# Patient Record
Sex: Female | Born: 1950 | Race: White | Hispanic: No | Marital: Married | Smoking: Never smoker
Health system: Southern US, Community
[De-identification: ages and names within clinical notes are randomized; demographics above are authoritative.]

## PROBLEM LIST (undated history)

## (undated) DIAGNOSIS — I10 Essential (primary) hypertension: Secondary | ICD-10-CM

## (undated) DIAGNOSIS — K529 Noninfective gastroenteritis and colitis, unspecified: Secondary | ICD-10-CM

## (undated) DIAGNOSIS — S4380XA Sprain of other specified parts of unspecified shoulder girdle, initial encounter: Secondary | ICD-10-CM

## (undated) DIAGNOSIS — R519 Headache, unspecified: Secondary | ICD-10-CM

## (undated) DIAGNOSIS — M75100 Unspecified rotator cuff tear or rupture of unspecified shoulder, not specified as traumatic: Secondary | ICD-10-CM

## (undated) DIAGNOSIS — K829 Disease of gallbladder, unspecified: Secondary | ICD-10-CM

## (undated) DIAGNOSIS — D649 Anemia, unspecified: Secondary | ICD-10-CM

## (undated) DIAGNOSIS — Z9289 Personal history of other medical treatment: Secondary | ICD-10-CM

## (undated) DIAGNOSIS — E785 Hyperlipidemia, unspecified: Secondary | ICD-10-CM

## (undated) DIAGNOSIS — R7303 Prediabetes: Secondary | ICD-10-CM

## (undated) DIAGNOSIS — L570 Actinic keratosis: Secondary | ICD-10-CM

## (undated) DIAGNOSIS — M1990 Unspecified osteoarthritis, unspecified site: Secondary | ICD-10-CM

## (undated) DIAGNOSIS — Z780 Asymptomatic menopausal state: Secondary | ICD-10-CM

## (undated) DIAGNOSIS — R600 Localized edema: Secondary | ICD-10-CM

## (undated) DIAGNOSIS — K5900 Constipation, unspecified: Secondary | ICD-10-CM

## (undated) DIAGNOSIS — E559 Vitamin D deficiency, unspecified: Secondary | ICD-10-CM

## (undated) DIAGNOSIS — M1711 Unilateral primary osteoarthritis, right knee: Secondary | ICD-10-CM

## (undated) DIAGNOSIS — K922 Gastrointestinal hemorrhage, unspecified: Secondary | ICD-10-CM

## (undated) DIAGNOSIS — R208 Other disturbances of skin sensation: Secondary | ICD-10-CM

## (undated) HISTORY — DX: Asymptomatic menopausal state: Z78.0

## (undated) HISTORY — DX: Hyperlipidemia, unspecified: E78.5

## (undated) HISTORY — DX: Anemia, unspecified: D64.9

## (undated) HISTORY — DX: Other disturbances of skin sensation: R20.8

## (undated) HISTORY — DX: Unilateral primary osteoarthritis, right knee: M17.11

## (undated) HISTORY — PX: SPINE SURGERY: SHX786

## (undated) HISTORY — DX: Essential (primary) hypertension: I10

## (undated) HISTORY — DX: Personal history of other medical treatment: Z92.89

## (undated) HISTORY — PX: FRACTURE SURGERY: SHX138

## (undated) HISTORY — DX: Vitamin D deficiency, unspecified: E55.9

## (undated) HISTORY — DX: Constipation, unspecified: K59.00

## (undated) HISTORY — PX: TUBAL LIGATION: SHX77

## (undated) HISTORY — DX: Gastrointestinal hemorrhage, unspecified: K92.2

## (undated) HISTORY — DX: Actinic keratosis: L57.0

## (undated) HISTORY — DX: Localized edema: R60.0

## (undated) HISTORY — DX: Prediabetes: R73.03

## (undated) HISTORY — PX: JOINT REPLACEMENT: SHX530

## (undated) HISTORY — DX: Disease of gallbladder, unspecified: K82.9

---

## 1973-03-02 HISTORY — PX: HERNIA REPAIR: SHX51

## 1985-03-02 HISTORY — PX: ABDOMINAL HYSTERECTOMY: SHX81

## 2000-03-02 HISTORY — PX: CERVICAL FUSION: SHX112

## 2000-03-30 ENCOUNTER — Encounter: Payer: Self-pay | Admitting: Internal Medicine

## 2000-03-30 ENCOUNTER — Encounter (INDEPENDENT_AMBULATORY_CARE_PROVIDER_SITE_OTHER): Payer: Self-pay | Admitting: Specialist

## 2000-03-30 ENCOUNTER — Inpatient Hospital Stay (HOSPITAL_COMMUNITY): Admission: EM | Admit: 2000-03-30 | Discharge: 2000-04-01 | Payer: Self-pay | Admitting: Internal Medicine

## 2000-04-01 ENCOUNTER — Encounter: Payer: Self-pay | Admitting: Internal Medicine

## 2000-08-01 ENCOUNTER — Emergency Department (HOSPITAL_COMMUNITY): Admission: EM | Admit: 2000-08-01 | Discharge: 2000-08-01 | Payer: Self-pay | Admitting: Emergency Medicine

## 2000-08-01 ENCOUNTER — Encounter: Payer: Self-pay | Admitting: Emergency Medicine

## 2000-08-02 ENCOUNTER — Encounter: Payer: Self-pay | Admitting: Emergency Medicine

## 2000-12-22 ENCOUNTER — Inpatient Hospital Stay (HOSPITAL_COMMUNITY): Admission: RE | Admit: 2000-12-22 | Discharge: 2000-12-25 | Payer: Self-pay | Admitting: Neurosurgery

## 2000-12-22 ENCOUNTER — Encounter: Payer: Self-pay | Admitting: Neurosurgery

## 2001-01-06 ENCOUNTER — Encounter: Admission: RE | Admit: 2001-01-06 | Discharge: 2001-01-06 | Payer: Self-pay | Admitting: Neurosurgery

## 2001-01-06 ENCOUNTER — Encounter: Payer: Self-pay | Admitting: Neurosurgery

## 2001-03-02 HISTORY — PX: APPENDECTOMY: SHX54

## 2001-07-15 ENCOUNTER — Ambulatory Visit (HOSPITAL_COMMUNITY): Admission: RE | Admit: 2001-07-15 | Discharge: 2001-07-15 | Payer: Self-pay | Admitting: Obstetrics and Gynecology

## 2001-07-15 ENCOUNTER — Encounter: Payer: Self-pay | Admitting: Obstetrics and Gynecology

## 2001-10-14 ENCOUNTER — Encounter: Payer: Self-pay | Admitting: Internal Medicine

## 2001-10-14 ENCOUNTER — Ambulatory Visit (HOSPITAL_COMMUNITY): Admission: RE | Admit: 2001-10-14 | Discharge: 2001-10-14 | Payer: Self-pay | Admitting: Internal Medicine

## 2003-10-25 ENCOUNTER — Ambulatory Visit (HOSPITAL_COMMUNITY): Admission: RE | Admit: 2003-10-25 | Discharge: 2003-10-25 | Payer: Self-pay | Admitting: Internal Medicine

## 2004-03-24 ENCOUNTER — Ambulatory Visit: Payer: Self-pay | Admitting: Internal Medicine

## 2004-06-01 ENCOUNTER — Ambulatory Visit: Payer: Self-pay | Admitting: Internal Medicine

## 2004-06-02 ENCOUNTER — Ambulatory Visit: Payer: Self-pay | Admitting: Gastroenterology

## 2004-06-02 ENCOUNTER — Inpatient Hospital Stay (HOSPITAL_COMMUNITY): Admission: EM | Admit: 2004-06-02 | Discharge: 2004-06-03 | Payer: Self-pay | Admitting: Emergency Medicine

## 2004-06-03 ENCOUNTER — Encounter (INDEPENDENT_AMBULATORY_CARE_PROVIDER_SITE_OTHER): Payer: Self-pay | Admitting: *Deleted

## 2004-06-03 HISTORY — PX: COLONOSCOPY: SHX174

## 2004-06-19 ENCOUNTER — Ambulatory Visit: Payer: Self-pay | Admitting: Internal Medicine

## 2004-07-03 ENCOUNTER — Ambulatory Visit: Payer: Self-pay | Admitting: Internal Medicine

## 2004-07-10 ENCOUNTER — Ambulatory Visit: Payer: Self-pay | Admitting: Internal Medicine

## 2005-08-21 ENCOUNTER — Ambulatory Visit: Payer: Self-pay | Admitting: Internal Medicine

## 2005-09-12 ENCOUNTER — Ambulatory Visit: Payer: Self-pay | Admitting: Family Medicine

## 2006-04-11 ENCOUNTER — Emergency Department (HOSPITAL_COMMUNITY): Admission: EM | Admit: 2006-04-11 | Discharge: 2006-04-11 | Payer: Self-pay | Admitting: Family Medicine

## 2006-04-20 ENCOUNTER — Emergency Department (HOSPITAL_COMMUNITY): Admission: EM | Admit: 2006-04-20 | Discharge: 2006-04-20 | Payer: Self-pay | Admitting: Family Medicine

## 2007-02-05 ENCOUNTER — Ambulatory Visit: Payer: Self-pay | Admitting: Family Medicine

## 2007-02-05 LAB — CONVERTED CEMR LAB: Rapid Strep: POSITIVE

## 2007-02-08 ENCOUNTER — Telehealth: Payer: Self-pay | Admitting: Internal Medicine

## 2007-03-30 ENCOUNTER — Ambulatory Visit: Payer: Self-pay | Admitting: Internal Medicine

## 2007-03-30 ENCOUNTER — Telehealth: Payer: Self-pay | Admitting: Internal Medicine

## 2007-06-23 ENCOUNTER — Ambulatory Visit: Payer: Self-pay | Admitting: Internal Medicine

## 2007-06-23 ENCOUNTER — Telehealth: Payer: Self-pay | Admitting: Internal Medicine

## 2007-06-29 ENCOUNTER — Telehealth: Payer: Self-pay | Admitting: Internal Medicine

## 2007-11-29 ENCOUNTER — Ambulatory Visit: Payer: Self-pay | Admitting: Internal Medicine

## 2007-11-29 ENCOUNTER — Telehealth: Payer: Self-pay | Admitting: Internal Medicine

## 2008-06-28 ENCOUNTER — Ambulatory Visit: Payer: Self-pay | Admitting: Internal Medicine

## 2008-06-28 ENCOUNTER — Telehealth: Payer: Self-pay | Admitting: Internal Medicine

## 2008-06-28 DIAGNOSIS — I10 Essential (primary) hypertension: Secondary | ICD-10-CM

## 2008-09-27 ENCOUNTER — Telehealth: Payer: Self-pay | Admitting: Internal Medicine

## 2008-09-27 ENCOUNTER — Ambulatory Visit: Payer: Self-pay | Admitting: Internal Medicine

## 2008-09-27 DIAGNOSIS — E785 Hyperlipidemia, unspecified: Secondary | ICD-10-CM

## 2008-09-27 LAB — CONVERTED CEMR LAB
Calcium: 10.2 mg/dL (ref 8.4–10.5)
GFR calc non Af Amer: 91.31 mL/min (ref >60)
Glucose, Bld: 106 mg/dL — ABNORMAL HIGH (ref 70–99)
HDL: 45.2 mg/dL (ref >39.00)

## 2008-10-03 ENCOUNTER — Telehealth: Payer: Self-pay | Admitting: Internal Medicine

## 2008-10-04 ENCOUNTER — Ambulatory Visit: Payer: Self-pay | Admitting: Internal Medicine

## 2008-10-04 ENCOUNTER — Telehealth: Payer: Self-pay | Admitting: Internal Medicine

## 2008-12-31 ENCOUNTER — Telehealth: Payer: Self-pay | Admitting: Internal Medicine

## 2009-01-04 ENCOUNTER — Ambulatory Visit: Payer: Self-pay | Admitting: Internal Medicine

## 2009-01-04 DIAGNOSIS — Z780 Asymptomatic menopausal state: Secondary | ICD-10-CM | POA: Insufficient documentation

## 2009-01-04 LAB — CONVERTED CEMR LAB
AST: 20 units/L (ref 0–37)
Alkaline Phosphatase: 71 units/L (ref 39–117)
Bilirubin Urine: NEGATIVE
Bilirubin, Direct: 0.1 mg/dL (ref 0.0–0.3)
Chloride: 105 meq/L (ref 96–112)
Creatinine, Ser: 0.7 mg/dL (ref 0.4–1.2)
Eosinophils Absolute: 0.2 10*3/uL (ref 0.0–0.7)
GFR calc non Af Amer: 91.22 mL/min (ref >60)
Glucose, Bld: 103 mg/dL — ABNORMAL HIGH (ref 70–99)
HCT: 39.8 % (ref 36.0–46.0)
HDL: 41.6 mg/dL (ref >39.00)
Lymphocytes Relative: 30 % (ref 12.0–46.0)
MCHC: 34.9 g/dL (ref 30.0–36.0)
MCV: 94.9 fL (ref 78.0–100.0)
Neutrophils Relative %: 60.6 % (ref 43.0–77.0)
Platelets: 226 10*3/uL (ref 150.0–400.0)
Potassium: 4.3 meq/L (ref 3.5–5.1)
RDW: 12.3 % (ref 11.5–14.6)
Sodium: 142 meq/L (ref 135–145)
Specific Gravity, Urine: <=1.005 (ref 1.000–1.030)
Total Bilirubin: 0.8 mg/dL (ref 0.3–1.2)
Total CHOL/HDL Ratio: 4
Total Protein, Urine: NEGATIVE mg/dL
Triglycerides: 92 mg/dL (ref 0.0–149.0)
Urobilinogen, UA: 0.2 (ref 0.0–1.0)
pH: 6 (ref 5.0–8.0)

## 2009-02-12 ENCOUNTER — Ambulatory Visit: Payer: Self-pay | Admitting: Internal Medicine

## 2009-02-12 DIAGNOSIS — J019 Acute sinusitis, unspecified: Secondary | ICD-10-CM

## 2009-02-13 ENCOUNTER — Telehealth: Payer: Self-pay | Admitting: Internal Medicine

## 2009-04-03 ENCOUNTER — Telehealth: Payer: Self-pay | Admitting: Internal Medicine

## 2009-05-06 ENCOUNTER — Ambulatory Visit: Payer: Self-pay | Admitting: Internal Medicine

## 2009-05-06 DIAGNOSIS — J029 Acute pharyngitis, unspecified: Secondary | ICD-10-CM

## 2009-05-13 ENCOUNTER — Telehealth: Payer: Self-pay | Admitting: Internal Medicine

## 2009-10-01 ENCOUNTER — Ambulatory Visit: Payer: Self-pay | Admitting: Internal Medicine

## 2009-10-01 ENCOUNTER — Encounter (INDEPENDENT_AMBULATORY_CARE_PROVIDER_SITE_OTHER): Payer: Self-pay | Admitting: *Deleted

## 2009-10-01 DIAGNOSIS — N390 Urinary tract infection, site not specified: Secondary | ICD-10-CM | POA: Insufficient documentation

## 2009-10-01 LAB — CONVERTED CEMR LAB
Glucose, Urine, Semiquant: NEGATIVE
Ketones, urine, test strip: NEGATIVE
Nitrite: NEGATIVE
Protein, U semiquant: NEGATIVE
Specific Gravity, Urine: <1.005

## 2010-04-01 NOTE — Assessment & Plan Note (Signed)
Summary: ??BLADDER INFECTION/CD   Vital Signs:  Patient profile:   60 year old female Weight:      199.50 pounds (90.68 kg) BMI:     31.36 O2 Sat:      96 % on Room air Temp:     98.3 degrees F (36.83 degrees C) oral Pulse rate:   64 / minute BP sitting:   138 / 70  (left arm) Cuff size:   regular  Vitals Entered By: Orlan Leavens RMA (October 01, 2009 2:47 PM)  Nutrition Counseling: Patient's BMI is greater than 25 and therefore counseled on weight management options.  O2 Flow:  Room air CC: UTI, burniing, itching Is Patient Diabetic? No Pain Assessment Patient in pain? no        Primary Care Provider:  Newt Lukes MD  CC:  UTI, burniing, and itching.  History of Present Illness: c/o dysuria - precipitated by sex 7 days ago - feels pressure and itching discomfort no flank pain - +hx same but not in >24mo ?bladder prolapse "bulge" ? -has appt with gyn to eval same upcoming   also review chronic med issues- HTN -reports compliance with ongoing medical treatment and no changes in medication dose or frequency. denies adverse side effects related to current therapy.   dyslipidemia - reports compliance with ongoing medical treatment and no changes in medication dose or frequency. denies adverse side effects related to current therapy.   Clinical Review Panels:  CBC   WBC:  7.0 (01/04/2009)   RBC:  4.19 (01/04/2009)   Hgb:  13.9 (01/04/2009)   Hct:  39.8 (01/04/2009)   Platelets:  226.0 (01/04/2009)   MCV  94.9 (01/04/2009)   MCHC  34.9 (01/04/2009)   RDW  12.3 (01/04/2009)   PMN:  60.6 (01/04/2009)   Lymphs:  30.0 (01/04/2009)   Monos:  6.3 (01/04/2009)   Eosinophils:  2.3 (01/04/2009)   Basophil:  0.8 (01/04/2009)  Complete Metabolic Panel   Glucose:  103 (01/04/2009)   Sodium:  142 (01/04/2009)   Potassium:  4.3 (01/04/2009)   Chloride:  105 (01/04/2009)   CO2:  31 (01/04/2009)   BUN:  14 (01/04/2009)   Creatinine:  0.7 (01/04/2009)   Albumin:   3.9 (01/04/2009)   Total Protein:  7.4 (01/04/2009)   Calcium:  9.9 (01/04/2009)   Total Bili:  0.8 (01/04/2009)   Alk Phos:  71 (01/04/2009)   SGPT (ALT):  21 (01/04/2009)   SGOT (AST):  20 (01/04/2009)   Current Medications (verified): 1)  Advil 200 Mg  Tabs (Ibuprofen) .... As Needed 2)  Aleve 220 Mg  Tabs (Naproxen Sodium) .... As Needed 3)  Benicar Hct 20-12.5 Mg Tabs (Olmesartan Medoxomil-Hctz) .... 2 By Mouth Once Daily 4)  Lipitor 10 Mg Tabs (Atorvastatin Calcium) .Marland Kitchen.. 1 By Mouth At Bedtime 5)  Flonase 50 Mcg/act Susp (Fluticasone Propionate) .Marland Kitchen.. 1 Spray Each Nostril Every Morning 6)  Xyzal 5 Mg Tabs (Levocetirizine Dihydrochloride) .Marland Kitchen.. 1 By Mouth Once Daily As Needed  Allergies (verified): 1)  ! Codeine  Past History:  Past Medical History: hypertension dyslipidemia  MD roster: gyn - [facility]  Past Surgical History: partial hysterectomy - 1987  Review of Systems  The patient denies fever, chest pain, abdominal pain, and hematuria.    Physical Exam  General:  alert, well-developed, well-nourished, and cooperative to examination.  nontoxic Lungs:  normal respiratory effort, no intercostal retractions or use of accessory muscles; normal breath sounds bilaterally - no crackles and no wheezes.  Heart:  normal rate, regular rhythm, no murmur, and no rub. BLE without edema. Abdomen:  soft, non-tender, normal bowel sounds, no distention; no masses and no appreciable hepatomegaly or splenomegaly.  no flank pain   Impression & Recommendations:  Problem # 1:  UTI (ICD-599.0)  tx 5 day cipro, send for Ucx to confirm - agree with gyn eval for ?prolapse symptoms  work note for tonight - done Her updated medication list for this problem includes:    Cipro 500 Mg Tabs (Ciprofloxacin hcl) .Marland Kitchen... 1 by mouth two times a day x 5 days  Orders: UA Dipstick w/o Micro (manual) (16109) Prescription Created Electronically [id number]) T-Culture, Urine ([id number])  Encouraged  to push clear liquids, get enough rest, and take acetaminophen as needed. To be seen in 10 days if no improvement, sooner if worse.  Complete Medication List: 1)  Advil 200 Mg Tabs (Ibuprofen) .... As needed 2)  Aleve 220 Mg Tabs (Naproxen sodium) .... As needed 3)  Benicar Hct 20-12.5 Mg Tabs (Olmesartan medoxomil-hctz) .... 2 by mouth once daily 4)  Lipitor 10 Mg Tabs (Atorvastatin calcium) .Marland Kitchen.. 1 by mouth at bedtime 5)  Flonase 50 Mcg/act Susp (Fluticasone propionate) .Marland Kitchen.. 1 spray each nostril every morning 6)  Xyzal 5 Mg Tabs (Levocetirizine dihydrochloride) .Marland Kitchen.. 1 by mouth once daily as needed 7)  Cipro 500 Mg Tabs (Ciprofloxacin hcl) .Marland Kitchen.. 1 by mouth two times a day x 5 days 8)  Fluconazole 150 Mg Tabs (Fluconazole) .Marland Kitchen.. 1 by mouth now, repeat as needed for itch  Patient Instructions: 1)  it was good to see you today. 2)  cipro and diflucan as discussed- ok to use Azo as needed - your prescriptions have been electronically submitted to your pharmacy. Please take as directed. Contact our office if you believe you're having problems with the medication(s).  3)  Get plenty of rest, drink lots of clear liquids, and use Tylenol or Ibuprofen for fever and comfort. Return in 7-10 days if you're not better:sooner if you're feeling worse. 4)  work excuse note provided 5)  followup with gynecology as discussed -  6)  Please keep follow-up appointment here as scheduled (or 6 months), call sooner if problems.  Prescriptions: FLUCONAZOLE 150 MG TABS (FLUCONAZOLE) 1 by mouth now, repeat as needed for itch  #2 x 1   Entered and Authorized by:   Newt Lukes MD   Signed by:   Newt Lukes MD on 10/01/2009   Method used:   [facility] [id number]       [address]       [place], [place]  [address]       Ph: [phone] or [phone]       Fax: [phone]   RxID:   [id number] CIPRO 500 MG TABS (CIPROFLOXACIN HCL) 1 by mouth two times a  day x 5 days  #10 x 0   Entered and Authorized by:   Newt Lukes MD   Signed by:   Newt Lukes MD on 10/01/2009   Method used:   [facility]        [facility] ([facility]       [address]       [place], [place]  [address]       Ph: [phone] or [phone]       Fax: [phone]   RxID:   [id number]   Laboratory Results  Urine Tests    Routine Urinalysis   Color: lt. yellow Appearance: Clear Glucose: negative   (Normal Range: Negative) Bilirubin: negative   (Normal Range: Negative) Ketone: negative   (Normal Range: Negative) Spec. Gravity: <1.005   (Normal Range: 1.003-1.035) Blood: large   (Normal Range: Negative) pH: 5.0   (Normal Range: 5.0-8.0) Protein: negative   (Normal Range: Negative) Urobilinogen: 0.2   (Normal Range: 0-1) Nitrite: negative   (Normal Range: Negative) Leukocyte Esterace: large   (Normal Range: Negative)

## 2010-04-01 NOTE — Letter (Signed)
Summary: Work [facility]  [address] [place], [place] [address]   Phone: [phone]  Fax: [phone]    Today's Date: October 01, 2009  Name of Patient: Bailey [name]  The above named patient had a medical visit today 10/01/09  Please take this into consideration when reviewing the time away from work  Special Instructions:  [  ] None  [  ] To be off the remainder of today, returning to the normal work tomorrow.  [  ] To be off until the next scheduled appointment on ______________________.  [  ] Other ________________________________________________________________ ________________________________________________________________________   Sincerely yours,   Dr. Rene Paci

## 2010-04-01 NOTE — Progress Notes (Signed)
Summary: RX refill  Phone Note Call from Patient Call back at Home Phone [phone]   Caller: Patient Summary of Call: pt called stating that she was senn by MD last week but still has head congestion. pt is requesting refills of Xyzal. Medication not med list...okay to fill? Initial call taken by: Margaret Pyle, CMA,  May 13, 2009 10:53 AM  Follow-up for Phone Call        yes - one daily as needed - thanks Follow-up by: Newt Lukes MD,  May 13, 2009 1:11 PM    New/Updated Medications: XYZAL 5 MG TABS (LEVOCETIRIZINE DIHYDROCHLORIDE) 1 by mouth once daily as needed Prescriptions: XYZAL 5 MG TABS (LEVOCETIRIZINE DIHYDROCHLORIDE) 1 by mouth once daily as needed  #30 x 11   Entered by:   Margaret Pyle, CMA   Authorized by:   Newt Lukes MD   Signed by:   Margaret Pyle, CMA on 05/13/2009   Method used:   [facility]        [facility] [facility]       [address]       [place], [place]  [address]       Ph: [phone] or [phone]       Fax: [phone]   RxID:   [id number]

## 2010-04-01 NOTE — Progress Notes (Signed)
Summary: benicar  Phone Note Refill Request Message from:  Fax from Pharmacy on April 03, 2009 11:31 AM  Refills Requested: Medication #1:  BENICAR HCT 20-12.5 MG TABS 2 by mouth once daily  Method Requested: Electronic Initial call taken by: Orlan Leavens,  April 03, 2009 11:31 AM    Prescriptions: BENICAR HCT 20-12.5 MG TABS (OLMESARTAN MEDOXOMIL-HCTZ) 2 by mouth once daily  #60 x 6   Entered by:   Orlan Leavens   Authorized by:   Newt Lukes MD   Signed by:   Orlan Leavens on 04/03/2009   Method used:   [facility]        [facility] ([facility]ail)       [address]       [place], [place]  [address]       Ph: [phone] or [phone]       Fax: [phone]   RxID:   [id number]

## 2010-04-01 NOTE — Assessment & Plan Note (Signed)
Summary: STREP? /NWS   Vital Signs:  Patient profile:   60 year old female Height:      67 inches (170.18 cm) Weight:      199.0 pounds (90.45 kg) BMI:     31.28 O2 Sat:      97 % on Room air Temp:     97.4 degrees F (36.33 degrees C) oral Pulse rate:   60 / minute BP sitting:   148 / 90  (left arm) Cuff size:   regular  Vitals Entered By: Orlan Leavens (May 06, 2009 4:25 PM)  O2 Flow:  Room air CC: ? strep throat, URI symptoms Is Patient Diabetic? No Pain Assessment Patient in pain? no        Primary Care Provider:  Newt Lukes MD  CC:  ? strep throat and URI symptoms.  History of Present Illness:  URI Symptoms      This is a 60 year old woman who presents with URI symptoms.  The symptoms began 3 days ago.  The severity is described as moderate.  g-son with second epidsode of strep requiring ER eval for IVF and prednisone - seening ENT today and now being checked for mono. Pt c/o swelling feeling in neck but no trouble breating or swallowing -.  The patient reports nasal congestion, sore throat, and sick contacts, but denies dry cough, productive cough, and earache.  The patient denies fever, dyspnea, wheezing, rash, vomiting, and use of an antipyretic.  The patient also reports sneezing, headache, and severe fatigue.  The patient denies seasonal symptoms and muscle aches.  Risk factors for Strep sinusitis include Strep exposure and absence of cough.  The patient denies the following risk factors for Strep sinusitis: tender adenopathy.    Current Medications (verified): 1)  Advil 200 Mg  Tabs (Ibuprofen) .... As Needed 2)  Aleve 220 Mg  Tabs (Naproxen Sodium) .... As Needed 3)  Benicar Hct 20-12.5 Mg Tabs (Olmesartan Medoxomil-Hctz) .... 2 By Mouth Once Daily 4)  Lipitor 10 Mg Tabs (Atorvastatin Calcium) .Marland Kitchen.. 1 By Mouth At Bedtime 5)  Flonase 50 Mcg/act Susp (Fluticasone Propionate) .Marland Kitchen.. 1 Spray Each Nostril Every Morning  Allergies (verified): 1)  ! Codeine  Past  History:  Past Medical History: hypertension dyslipidemia  Review of Systems  The patient denies anorexia, vision loss, decreased hearing, hoarseness, chest pain, dyspnea on exertion, and abdominal pain.    Physical Exam  General:  alert, well-developed, well-nourished, and cooperative to examination.   mildly ill Eyes:  vision grossly intact; pupils equal, round and reactive to light.  conjunctiva and lids normal.    Ears:  normal pinnae bilaterally, without erythema, swelling, or tenderness to palpation. TMs clear, without effusion, or cerumen impaction. Hearing grossly normal bilaterally  Mouth:  teeth and gums in good repair; mucous membranes moist, without lesions or ulcers. oropharynx clear without exudate, mod erythema. +PND Lungs:  normal respiratory effort, no intercostal retractions or use of accessory muscles; normal breath sounds bilaterally - no crackles and no wheezes.    Heart:  normal rate, regular rhythm, no murmur, and no rub. BLE without edema. Psych:  Oriented X3, memory intact for recent and remote, normally interactive, good eye contact, not anxious appearing, not depressed appearing, and not agitated.      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 As needed    Aleve 220 Mg Tabs (Naproxen sodium) .Marland Kitchen... As needed    Amoxicillin 500 Mg Caps (Amoxicillin) .Marland Kitchen... 1 by mouth three times a day x 7 days  Orders: Rapid Strep ([address]) Prescription Created Electronically [phone])  Instructed to complete antibiotics and call if not improved in 48 hours.   Complete Medication List: 1)  Advil 200 Mg Tabs (Ibuprofen) .... As needed 2)  Aleve 220 Mg Tabs (Naproxen sodium) .... As needed 3)  Benicar Hct 20-12.5 Mg Tabs (Olmesartan medoxomil-hctz) .... 2 by  mouth once daily 4)  Lipitor 10 Mg Tabs (Atorvastatin calcium) .Marland Kitchen.. 1 by mouth at bedtime 5)  Flonase 50 Mcg/act Susp (Fluticasone propionate) .Marland Kitchen.. 1 spray each nostril every morning 6)  Amoxicillin 500 Mg Caps (Amoxicillin) .Marland Kitchen.. 1 by mouth three times a day x 7 days  Patient Instructions: 1)  it was good to see you today. 2)  antibiotics - amoxicillin for your throat symptoms as discussed -your prescription has been electronically submitted to your pharmacy. Please take as directed. Contact our office if you believe you're having problems with the medication(s).  3)  Get plenty of rest, drink lots of clear liquids, and use Tylenol or Ibuprofen for fever and comfort. Return in 7-10 days if you're not better:sooner if you're feeling worse. Prescriptions: AMOXICILLIN 500 MG CAPS (AMOXICILLIN) 1 by mouth three times a day x 7 days  #21 x 0   Entered and Authorized by:   Newt Lukes MD   Signed by:   Newt Lukes MD on 05/06/2009   Method used:   [facility]        [facility] ([facility]ail)       [address]       [place], [place]  [address]       Ph: [phone] or [phone]       Fax: [phone]   RxID:   [id number]   Laboratory Results    Other Tests  Rapid Strep: negative

## 2010-05-30 ENCOUNTER — Other Ambulatory Visit: Payer: Self-pay | Admitting: Internal Medicine

## 2010-07-17 ENCOUNTER — Telehealth: Payer: Self-pay

## 2010-07-17 MED ORDER — SCOPOLAMINE 1 MG/3DAYS TD PT72: 1.0000 | MEDICATED_PATCH | TRANSDERMAL | Status: DC

## 2010-07-17 NOTE — Telephone Encounter (Signed)
Pharmacy called requesting prescription for motion sickness patches for pt. Pt will be going on a cruise in the near future

## 2010-07-17 NOTE — Telephone Encounter (Signed)
rx sent

## 2010-07-18 NOTE — Discharge Summary (Signed)
[facility]  Patient:    Bailey [name], Bailey [name]                      MRN: [id number] Adm. Date:  [id number] Disc. Date: [id number] Attending:  Tresa Garter CC:         Sonda Primes, M.D. [facility]  Rosalyn Gess. Norins, M.D. [facility]   Discharge Summary  REASON FOR ADMISSION:  Abdominal pain, nausea, vomiting.  HISTORY OF PRESENT ILLNESS:  The patient is a 60 year old white female who presented to the office at [facility] with nausea, vomiting, and abdominal pain for six to eight hours.  The patient was seen and evaluated and sent to [facility] for admission and hydration.  CT scan of the abdomen was performed which showed findings consistent with acute appendicitis.  General surgery was consulted.  HOSPITAL COURSE:  The patient was seen on the evening of March 30, 2000, after admission, and CT scan of the abdomen demonstrated findings consistent with acute appendicitis.  The patient was prepared and taken to the operating room.  She underwent laparoscopic appendectomy with findings of acute appendicitis.  Postoperative course was straightforward.  She was advanced on her diet from clear liquids to a regular diet.  She became ambulatory.  She received 48 hours of intravenous antibiotics.  She was prepared for discharge home on the second postoperative day.  DISCHARGE PLAN:  The patient is discharged home April 01, 2000, in good condition, tolerated a regular diet, and ambulating independently.  FOLLOW-UP:  She will be seen back in my office at [facility] in two weeks.  DISCHARGE MEDICATIONS:  Vicodin as needed for pain, and other medications as per usual.  FINAL DIAGNOSIS:  Acute suppurative appendicitis.  CONDITION ON DISCHARGE:  Improved. DD:  04/16/00 TD:  04/17/00 Job: [id number]

## 2010-07-18 NOTE — Discharge Summary (Signed)
Bailey [name], Bailey [name]               ACCOUNT NO.:  [id number]   MEDICAL RECORD NO.:  [id number]          PATIENT TYPE:  INP   LOCATION:  0460                         FACILITY:  [facility]   PHYSICIAN:  Rene Paci, M.D. LHCDATE OF BIRTH:  08-Dec-1950   DATE OF ADMISSION:  06/01/2004  DATE OF DISCHARGE:  06/03/2004                                 DISCHARGE SUMMARY   DISCHARGE DIAGNOSES:  1.  Acute lower gastrointestinal bleed secondary to moderate ischemic      colitis status post colonoscopy June 03, 2004, with ischemic changes at      splenic flexure to descending colon, bleeding resolved, hemodynamically      stable.  2.  History of hypertension, avoid diuretics for volume control per GI.  3.  History of anxiety and depression.  4.  History of migraines.  5.  Question restless legs with nightly leg cramps, further outpatient      workup per primary M.D.   DISCHARGE MEDICATIONS:  Include:  1.  Discontinuation of HCTZ.  2.  Prinivil 10 mg p.o. b.i.d.  3.  Phenergan 12.5 mg p.o. q.4 hours p.r.n.  4.  Darvocet 1-2 p.o. q.4 hours p.r.n.   DISPOSITION:  patient is discharged home in medically stable condition.   CONSULTS:  Include Dr. Claudette Head, of GI.   HOSPITAL COURSE BY PROBLEM:  Acute lower GI bleed.  The patient is a  pleasant, but anxious, 60 year old woman who came to the emergency room the  day of admission secondary to ongoing bright red blood per rectum and clots  associated with abdominal cramping diffusely.  In the emergency room, she  was hemodynamically stable with a hemoglobin of 15.4 but reluctant to go  home as she wished to pursue an inpatient colonoscopy.  The following  morning her hemoglobin remained stable at 13.4 with no further bleeding, but  a GI consult was called at patient's request.  Dr. Russella Dar saw patient and  agreed to do colonoscopy which was performed on June 03, 2004, showing mild  ischemic changes at the splenic flexure perhaps due to volume  status of  diuretic.  Thus, diuretics were discontinued and her Prinivil was increased  to twice daily from blood pressure control.  Further monitoring and followup  with primary care physician, avoid aspirin and other NSAID products.  No  other changes were noted on colonoscopy.  Other medications are as prior to  admission without change.      VL/MEDQ  D:  06/03/2004  T:  06/03/2004  Job:  [id number]

## 2010-07-18 NOTE — Op Note (Signed)
[facility]  Patient:    Bailey [name], Bailey [name]                      MRN: [id number] Proc. Date: 03/30/00 Adm. Date:  [id number] Attending:  Tresa Garter CC:         Sonda Primes, M.D. [facility]   Operative Report  PREOPERATIVE DIAGNOSIS:  Acute appendicitis.  POSTOPERATIVE DIAGNOSIS:  Acute appendicitis.  PROCEDURE:  Laparoscopic appendectomy.  SURGEON:  Velora Heckler, M.D.  ANESTHESIA:  General per Dr. Almeta Monas.  ESTIMATED BLOOD LOSS:  Minimal.  PREPARATION:  Betadine.  COMPLICATIONS:  None.  INDICATIONS FOR PROCEDURE:  The patients a 60 year old white female referred by Dr. Sonda Primes for acute appendicitis. The patient had been seen in his office with diffuse abdominal pain, nausea and vomiting. She had been admitted to the hospital at [facility] for observation. White blood cell count was elevated at 16,000 with a left shift. CT scan of the abdomen was obtained was obtained with findings suspicious for acute appendicitis. The patient was brought to the operating room at this time for appendectomy.  DESCRIPTION OF PROCEDURE:  The procedure was done in OR #1 at the [facility]. The patient is brought to the operating room, placed in a supine position on the operating room table. Following the administration of general anesthesia, the patient was prepped and draped in the usual strict aseptic fashion. After ascertaining that an adequate level of anesthesia had been obtained, an infraumbilical incision is made in the midline with a #15 blade. Dissection was carried down to the fascia. The fascia was incised in the midline. The peritoneal cavity is entered cautiously. An #0 Vicryl pursestring suture is placed in the fascia. A Hasson cannula is introduced under direct vision and secured with the pursestring suture. The abdomen is insufflated with carbon dioxide. The laparoscope was introduced under  direct vision and the abdomen explored. There is fluid and adhesions in the right lower quadrant. Operative ports are placed in the right upper quadrant and left lower quadrant. The cecum is mobilized. There is an inflamed indurated edematous appearing appendix. There is no sign of perforation nor abscess. A window is made at the base of the appendix. Using the GIA type endostapler, the base of the appendix is transected. A second reload of the stapler is used to partially transect the appendiceal mesentery. A third reload of the stapler is then used to complete transection of the mesoappendix. The appendix is placed into an endocatch bag and withdrawn through the left lower quadrant port without difficulty. It is submitted to pathology for review. The right lower quadrant is irrigated with warm saline which is evacuated. The staple lines are inspected to assure hemostasis. Ports are removed under direct vision and pneumoperitoneum released. The #0 Vicryl pursestring suture is tied securely. All three operative sites are anesthetized with local anesthetic. All three wounds are closed with interrupted 4-0 Vicryl subcuticular sutures. The wounds are washed and dried and Benzoin and Steri-Strips are applied. Sterile gauze dressings are applied. The patient is awakened from anesthesia and brought to the recovery room in stable condition. The patient tolerated the procedure well. DD:  03/30/00 TD:  03/31/00 Job: [id number]

## 2010-07-18 NOTE — Assessment & Plan Note (Signed)
[facility]                                 ON-CALL NOTE   Bailey [name], Bailey [name]                        MRN:          [id number]  DATE:04/10/2006                            DOB:          August 30, 1950    Patient calling because she has a sore throat, head congestion, scratchy  throat for one day.  Called back twice and got an answering machine.  Finally, she paged back again, and phone was open.  Discussed symptoms.  Explained they sound like a viral syndrome.  Treat symptomatology with  over-the-counter medications.  See your doctor p.r.n.     Jeffrey A. Tawanna Cooler, MD  Electronically Signed    JAT/MedQ  DD: 04/10/2006  DT: 04/10/2006  Job #: [id number]

## 2010-07-18 NOTE — H&P (Signed)
[facility]  Patient:    Bailey [name], Bailey [name]                        MRN: [id number] Adm. Date:  03/30/00 Attending:  Sonda Primes, M.D. [facility] CC:         Rosalyn Gess. Norins, M.D. [facility]   History and Physical  DATE OF BIRTH:  1950/07/07  CHIEF COMPLAINT:  Nausea, vomiting, abdominal pain, weakness.  HISTORY OF PRESENT ILLNESS:  The patient is a 60 year old white female who woke up weak this morning with some nausea, went to work, progressed to vomiting, went home, had a couple loose stools.  She felt very weak, feverish, was unable to keep anything down.  She vomited three times total.  She presented to the office in the afternoon.  PAST MEDICAL HISTORY:  Hypertension.  ALLERGIES:  CODEINE.  MEDICINES: 1. Prinivil 10 mg a day. 2. HCTZ one half a day.  FAMILY HISTORY:  Negative for heart disease.  SOCIAL HISTORY:  She is married, does not smoke.  She is a Production designer, theatre/television/film.  REVIEW OF SYSTEMS:  As above.  Negative.  PHYSICAL EXAMINATION:  VITAL SIGNS:  Blood pressure 140/110, pulse 84, temperature 98.3.  GENERAL:  She is in mild acute distress, looks tired.  Her face is flushed.  HEENT:  With dryish oral mucosa.  NECK:  Supple, no meningeal signs.  LUNGS:  Clear, no wheezes.  HEART:  Regular, S1, S2, no gallop, slight tachycardia.  ABDOMEN:  Soft, tender in the right lower quadrant with equivocal rebound symptoms.  No organomegaly, no masses.  _________ examination was not done.  EXTREMITIES:  Lower extremities without edema.  Skin without rash  NEUROLOGIC:  She is alert, oriented, and cooperative.  ASSESSMENT/PLAN: 1. Right lower quadrant abdominal pain, unknown etiology, rule out    appendicitis.  Obtain CT scan with contrast.  Pain is most likely related    to gastrointestinal illness. 2. Dehydration treated with IV fluids. 3. Hypertension.  Continue with Prinivil. 4. Nausea and vomiting.  Will treat with IV Phenergan. DD:  03/30/00 TD:   03/30/00 Job: [id number]

## 2010-07-18 NOTE — Assessment & Plan Note (Signed)
[facility]                                 ON-CALL NOTE   MEGYN, LENG                        MRN:          [id number]  DATE:04/11/2006                            DOB:          14-Dec-1950    Patient of Dr. Debby Bud.   [phone]   The patient called in because she has a fever and does not feel good.  I  called back.  The patient unavailable.  All I got was an answering  machine.  Advised the patient to either come to the local Urgent Care if  she needs to be seen today, or she could utilize the emergency room if  she felt it was a medical emergency.  If there was a problem that Dr.  Debby Bud could see in the office, please call the office Monday morning  and Dr. Debby Bud would be happy to see her Monday in the office.     Jeffrey A. Tawanna Cooler, MD  Electronically Signed    JAT/MedQ  DD: 04/11/2006  DT: 04/11/2006  Job #: [id number]
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
## 2010-07-18 NOTE — Consult Note (Signed)
[facility]  Patient:    Bailey [name], Bailey [name]                      MRN: [id number] Proc. Date: 03/30/00 Adm. Date:  [id number] Attending:  Tresa Garter CC:         Sonda Primes, M.D. [facility]   Consultation Report  REFERRING PHYSICIAN:  Dr. Sonda Primes.  REASON FOR CONSULTATION:  Acute appendicitis.  BRIEF HISTORY:  The patient is a 60 year old white female, admitted from Dr. Adah Perl office today to [facility] for abdominal pain, nausea and vomiting.  Patient had awakened from sleep this morning with nausea.  She developed emesis.  She developed diffuse abdominal pain which gradually localized to the right lower quadrant.  She had two episodes of diarrhea.  She was seen at the office and sent to [facility], where she was admitted for observation.  Patient was treated with intravenous fluids.  Laboratory studies were drawn.  Patient was sent to CT scan, where CT scan of the abdomen and pelvis showed findings consistent with acute appendicitis; general surgery was then consulted.  PAST MEDICAL HISTORY:  Status post total vaginal hysterectomy, status post umbilical hernia repair, history of hypertension.  MEDICATIONS:  Prinivil and hydrochlorothiazide.  ALLERGIES:  CODEINE (nausea and vomiting).  SOCIAL HISTORY:  Patient does not smoke.  She drinks alcohol on rare occasions.  She is married and lives in [place], [place].  She is accompanied today by her daughter.  REVIEW OF SYSTEMS:  Fifteen-system review without significant other positives except as noted above.  FAMILY HISTORY:  Noncontributory.  PHYSICAL EXAMINATION  GENERAL:  [age]-year-old white female on a stretcher in the holding area of the operating room.  Patient has visible chills.  VITAL SIGNS:  Vital signs show temperature of 97.9, pulse 79, respirations 18, blood pressure 138/71.  HEENT:  Normocephalic.  Sclerae are  clear.  Mucous membranes are dry.  NECK:  Supple without masses.  Thyroid is normal without nodularity.  LUNGS:  Clear to auscultation bilaterally.  There is no costovertebral angle tenderness.  CARDIAC:  Regular rate and rhythm.  ABDOMEN:  Soft.  There are bowel sounds present.  There is tenderness to percussion and palpation in the right lower quadrant.  There is no guarding. There is no rebound tenderness.  There is no palpable mass.  Surgical wound at the umbilicus is well-healed.  EXTREMITIES:  Nontender without edema.  NEUROLOGIC:  Patient is alert and oriented to person, place and time without focal neurologic deficits.  RADIOGRAPHIC STUDIES:  CT scan of abdomen and pelvis dated March 29, 2000 is suspicious for acute appendicitis.  No abscess is identified.  Gallstones are noted.  IMPRESSION:  Acute appendicitis.  PLAN 1. Initiation of intravenous antibiotics, Unasyn ordered by Dr. Posey Rea. 2. To operating room for appendectomy. 3. Routine postoperative care on the surgical service. DD:  03/30/00 TD:  03/31/00 Job: [id number]
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
## 2010-07-24 ENCOUNTER — Other Ambulatory Visit (INDEPENDENT_AMBULATORY_CARE_PROVIDER_SITE_OTHER): Payer: Self-pay

## 2010-07-24 DIAGNOSIS — Z0000 Encounter for general adult medical examination without abnormal findings: Secondary | ICD-10-CM

## 2010-07-24 DIAGNOSIS — Z13220 Encounter for screening for lipoid disorders: Secondary | ICD-10-CM

## 2010-07-24 LAB — URINALYSIS, ROUTINE W REFLEX MICROSCOPIC
Leukocytes, UA: NEGATIVE
Nitrite: NEGATIVE
Specific Gravity, Urine: 1.01 (ref 1.000–1.030)
Total Protein, Urine: NEGATIVE
Urobilinogen, UA: 0.2 (ref 0.0–1.0)
pH: 6.5 (ref 5.0–8.0)

## 2010-07-24 LAB — LDL CHOLESTEROL, DIRECT: Direct LDL: 182.2 mg/dL

## 2010-07-24 LAB — HEPATIC FUNCTION PANEL
Albumin: 3.8 g/dL (ref 3.5–5.2)
Alkaline Phosphatase: 62 U/L (ref 39–117)
Total Protein: 6.9 g/dL (ref 6.0–8.3)

## 2010-07-24 LAB — CBC WITH DIFFERENTIAL/PLATELET
Basophils Absolute: 0 10*3/uL (ref 0.0–0.1)
Basophils Relative: 0.4 % (ref 0.0–3.0)
Eosinophils Absolute: 0.2 10*3/uL (ref 0.0–0.7)
HCT: 40.2 % (ref 36.0–46.0)
Hemoglobin: 14.3 g/dL (ref 12.0–15.0)
MCHC: 35.5 g/dL (ref 30.0–36.0)
Monocytes Absolute: 0.5 10*3/uL (ref 0.1–1.0)
Platelets: 226 10*3/uL (ref 150.0–400.0)
RBC: 4.33 Mil/uL (ref 3.87–5.11)
RDW: 13.3 % (ref 11.5–14.6)

## 2010-07-24 LAB — TSH: TSH: 1.39 u[IU]/mL (ref 0.35–5.50)

## 2010-07-24 LAB — BASIC METABOLIC PANEL
Chloride: 105 mEq/L (ref 96–112)
Creatinine, Ser: 0.8 mg/dL (ref 0.4–1.2)
GFR: 80.08 mL/min (ref >60.00)
Glucose, Bld: 105 mg/dL — ABNORMAL HIGH (ref 70–99)

## 2010-07-24 LAB — LIPID PANEL
HDL: 48 mg/dL (ref >39.00)
Total CHOL/HDL Ratio: 5
VLDL: 22.4 mg/dL (ref 0.0–40.0)

## 2010-07-29 ENCOUNTER — Encounter: Payer: Self-pay | Admitting: Internal Medicine

## 2010-07-31 ENCOUNTER — Ambulatory Visit (INDEPENDENT_AMBULATORY_CARE_PROVIDER_SITE_OTHER): Payer: BC Managed Care – PPO | Admitting: Internal Medicine

## 2010-07-31 ENCOUNTER — Encounter: Payer: Self-pay | Admitting: Internal Medicine

## 2010-07-31 VITALS — BP 120/84 | HR 48 | Temp 97.5°F | Ht 67.0 in | Wt 199.0 lb

## 2010-07-31 DIAGNOSIS — Z0000 Encounter for general adult medical examination without abnormal findings: Secondary | ICD-10-CM

## 2010-07-31 DIAGNOSIS — E785 Hyperlipidemia, unspecified: Secondary | ICD-10-CM

## 2010-07-31 DIAGNOSIS — I10 Essential (primary) hypertension: Secondary | ICD-10-CM

## 2010-07-31 MED ORDER — FLUTICASONE PROPIONATE 50 MCG/ACT NA SUSP: 1.0000 | Freq: Every day | NASAL | Status: DC

## 2010-07-31 MED ORDER — OLMESARTAN MEDOXOMIL-HCTZ 20-12.5 MG PO TABS: 1.0000 | ORAL_TABLET | Freq: Every day | ORAL | Status: DC

## 2010-07-31 MED ORDER — ATORVASTATIN CALCIUM 10 MG PO TABS: 10.0000 mg | ORAL_TABLET | Freq: Every day | ORAL | Status: DC

## 2010-07-31 NOTE — Patient Instructions (Signed)
It was good to see you today. Exam, EKG look good today Continue walking and try probiotic like Align daily for 30days we'll make referral to Dr. Katrinka Blazing for gynecology and for bone dencsity and mammogram . Our office will contact you regarding appointment(s) once made. Resume Lipitor for cholesterol - other medications reviewed,  Other prescription changes at this time. 90 day refill on medication(s) as discussed today. Please schedule followup in 6 months for blood pressure and cholesterol check, call sooner if problems.

## 2010-07-31 NOTE — Assessment & Plan Note (Signed)
The current medical regimen is effective;  continue present plan and medications.  BP Readings from Last 3 Encounters:  07/31/10 120/84  10/01/09 138/70  05/06/09 148/90

## 2010-07-31 NOTE — Assessment & Plan Note (Signed)
Will resume statin after review of change FLP on/off statin rx
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
## 2010-08-19 ENCOUNTER — Other Ambulatory Visit: Payer: Self-pay | Admitting: Internal Medicine

## 2010-08-19 DIAGNOSIS — Z1231 Encounter for screening mammogram for malignant neoplasm of breast: Secondary | ICD-10-CM

## 2010-09-20 ENCOUNTER — Other Ambulatory Visit: Payer: Self-pay | Admitting: Internal Medicine

## 2010-10-01 ENCOUNTER — Ambulatory Visit: Payer: BC Managed Care – PPO

## 2010-10-30 ENCOUNTER — Ambulatory Visit
Admission: RE | Admit: 2010-10-30 | Discharge: 2010-10-30 | Disposition: A | Discharge status: Home / Self Care | Payer: BC Managed Care – PPO | Source: Ambulatory Visit | Attending: Internal Medicine | Admitting: Internal Medicine

## 2010-10-30 DIAGNOSIS — Z1231 Encounter for screening mammogram for malignant neoplasm of breast: Secondary | ICD-10-CM

## 2011-01-13 ENCOUNTER — Telehealth: Payer: Self-pay | Admitting: *Deleted

## 2011-01-13 DIAGNOSIS — E785 Hyperlipidemia, unspecified: Secondary | ICD-10-CM

## 2011-01-13 NOTE — Telephone Encounter (Signed)
Need lipid order in epic...01/13/11@4 :49pm/LMB

## 2011-01-30 ENCOUNTER — Other Ambulatory Visit: Payer: BC Managed Care – PPO

## 2011-02-03 ENCOUNTER — Ambulatory Visit: Payer: BC Managed Care – PPO | Admitting: Internal Medicine

## 2011-02-26 ENCOUNTER — Ambulatory Visit (INDEPENDENT_AMBULATORY_CARE_PROVIDER_SITE_OTHER): Payer: BC Managed Care – PPO | Admitting: Internal Medicine

## 2011-02-26 ENCOUNTER — Encounter: Payer: Self-pay | Admitting: Internal Medicine

## 2011-02-26 VITALS — BP 118/84 | HR 58 | Temp 97.7°F

## 2011-02-26 DIAGNOSIS — J069 Acute upper respiratory infection, unspecified: Secondary | ICD-10-CM

## 2011-02-26 DIAGNOSIS — R059 Cough, unspecified: Secondary | ICD-10-CM

## 2011-02-26 DIAGNOSIS — J111 Influenza due to unidentified influenza virus with other respiratory manifestations: Secondary | ICD-10-CM

## 2011-02-26 DIAGNOSIS — B309 Viral conjunctivitis, unspecified: Secondary | ICD-10-CM

## 2011-02-26 DIAGNOSIS — R05 Cough: Secondary | ICD-10-CM

## 2011-02-26 MED ORDER — OSELTAMIVIR PHOSPHATE 75 MG PO CAPS: 75.0000 mg | ORAL_CAPSULE | Freq: Two times a day (BID) | ORAL | Status: AC

## 2011-02-26 MED ORDER — HYDROCOD POLST-CHLORPHEN POLST 10-8 MG/5ML PO LQCR: 5.0000 mL | Freq: Two times a day (BID) | ORAL | Status: DC | PRN

## 2011-02-26 MED ORDER — OLMESARTAN MEDOXOMIL-HCTZ 20-12.5 MG PO TABS: 1.0000 | ORAL_TABLET | Freq: Two times a day (BID) | ORAL | Status: DC

## 2011-02-26 MED ORDER — ATORVASTATIN CALCIUM 10 MG PO TABS: 10.0000 mg | ORAL_TABLET | Freq: Every day | ORAL | Status: DC

## 2011-02-26 NOTE — Patient Instructions (Addendum)
It was good to see you today. Tamiflu and prescription Tussionex cough syrup as discussed - Your prescription(s) have been submitted to your pharmacy. Please take as directed and contact our office if you believe you are having problem(s) with the medication(s). Also refill on your blood pressure and cholesterol medications Alternate between ibuprofen and tylenol for aches, pain and fever symptoms as discussed Hydrate, rest and call us if symptoms worse or unimproved  Influenza Facts Flu (influenza) is a contagious respiratory illness caused by the influenza viruses. It can cause mild to severe illness. While most healthy people recover from the flu without specific treatment and without complications, older people, young children, and people with certain health conditions are at higher risk for serious complications from the flu, including death. CAUSES    The flu virus is spread from person to person by respiratory droplets from coughing and sneezing.     A person can also become infected by touching an object or surface with a virus on it and then touching their mouth, eye or nose.     Adults may be able to infect others from 1 day before symptoms occur and up to 7 days after getting sick. So it is possible to give someone the flu even before you know you are sick and continue to infect others while you are sick.  SYMPTOMS    Fever (usually high).     Headache.    Tiredness (can be extreme).     Cough.    Sore throat.     Runny or stuffy nose.     Body aches.     Diarrhea and vomiting may also occur, particularly in children.     These symptoms are referred to as "flu-like symptoms". A lot of different illnesses, including the common cold, can have similar symptoms.  DIAGNOSIS    There are tests that can determine if you have the flu as long you are tested within the first 2 or 3 days of illness.     A doctor's exam and additional tests may be needed to identify if you have a  disease that is a complicating the flu.  RISKS AND COMPLICATIONS   Some of the complications caused by the flu include:  Bacterial pneumonia or progressive pneumonia caused by the flu virus.     Loss of body fluids (dehydration).     Worsening of chronic medical conditions, such as heart failure, asthma, or diabetes.     Sinus problems and ear infections.  HOME CARE INSTRUCTIONS    Seek medical care early on.     If you are at high risk from complications of the flu, consult your health-care provider as soon as you develop flu-like symptoms. Those at high risk for complications include:     People 65 years or older.     People with chronic medical conditions, including diabetes.     Pregnant women.     Young children.     Your caregiver may recommend use of an antiviral medication to help treat the flu.     If you get the flu, get plenty of rest, drink a lot of liquids, and avoid using alcohol and tobacco.     You can take over-the-counter medications to relieve the symptoms of the flu if your caregiver approves. (Never give aspirin to children or teenagers who have flu-like symptoms, particularly fever).  PREVENTION   The single best way to prevent the flu is to get a flu vaccine  each fall. Other measures that can help protect against the flu are:  Antiviral Medications     A number of antiviral drugs are approved for use in preventing the flu. These are prescription medications, and a doctor should be consulted before they are used.     Habits for Good Health     Cover your nose and mouth with a tissue when you cough or sneeze, throw the tissue away after you use it.     Wash your hands often with soap and water, especially after you cough or sneeze. If you are not near water, use an alcohol-based hand cleaner.     Avoid people who are sick.     If you get the flu, stay home from work or school. Avoid contact with other people so that you do not make them sick, too.       Try not to touch your eyes, nose, or mouth as germs ore often spread this way.  IN CHILDREN, EMERGENCY WARNING SIGNS THAT NEED URGENT MEDICAL ATTENTION:  Fast breathing or trouble breathing.     Bluish skin color.     Not drinking enough fluids.     Not waking up or not interacting.     Being so irritable that the child does not want to be held.     Flu-like symptoms improve but then return with fever and worse cough.     Fever with a rash.  IN ADULTS, EMERGENCY WARNING SIGNS THAT NEED URGENT MEDICAL ATTENTION:  Difficulty breathing or shortness of breath.     Pain or pressure in the chest or abdomen.     Sudden dizziness.     Confusion.    Severe or persistent vomiting.  SEEK IMMEDIATE MEDICAL CARE IF:   You or someone you know is experiencing any of the symptoms above. When you arrive at the emergency center,report that you think you have the flu. You may be asked to wear a mask and/or sit in a secluded area to protect others from getting sick. MAKE SURE YOU:    Understand these instructions.     Monitor your condition.     Seek medical care if you are getting worse, or not improving.  Document Released: 02/19/2003 Document Revised: 10/29/2010 Document Reviewed: 11/15/2008 [facility] Patient Information 2012 [place], [place].

## 2011-02-26 NOTE — Progress Notes (Signed)
  Subjective:    HPI  complains of head cold symptoms  Onset 48 h ago, rapidly progressive symptoms  associated with rhinorrhea, sneezing, sore throat, mild headache and high fever (>102) Also severe myalgias, sinus pressure and mild chest congestion min relief with OTC meds Precipitated by sick contacts  Past Medical History  Diagnosis Date  . POSTMENOPAUSAL STATUS   . DYSLIPIDEMIA   . HYPERTENSION     Review of Systems Constitutional: No night sweats, no unexpected weight change Pulmonary: No pleurisy or hemoptysis Cardiovascular: No chest pain or palpitations     Objective:   Physical Exam BP 118/84  Pulse 58  Temp(Src) 97.7 F (36.5 C) (Oral)  SpO2 97% GEN: mildly ill appearing and audible head congestion HENT: NCAT, mild sinus tenderness bilaterally, nares with clear discharge, oropharynx mod erythema, no exudate Eyes: Vision grossly intact, mild bilateral conjunctivitis Lungs: Clear to auscultation with few rhonchi , no wheeze, no increased work of breathing Cardiovascular: Regular rate and rhythm, no bilateral edema      Assessment & Plan:  Viral URI > suspect influenze Cough, postnasal drip related to above Conjunctivitis, viral   Explained lack of efficacy for antibiotics in viral disease but  prescription for tamiflu prescribed high likelihood influenza infection  Prescription cough suppression- new prescriptions done Symptomatic care with Tylenol or Advil, hydration and rest -  salt gargle advised as needed

## 2011-03-17 ENCOUNTER — Ambulatory Visit: Payer: BC Managed Care – PPO | Admitting: Internal Medicine

## 2011-04-03 ENCOUNTER — Other Ambulatory Visit: Payer: Self-pay | Admitting: *Deleted

## 2011-04-03 MED ORDER — OLMESARTAN MEDOXOMIL-HCTZ 20-12.5 MG PO TABS: 1.0000 | ORAL_TABLET | Freq: Two times a day (BID) | ORAL | Status: DC

## 2011-04-07 ENCOUNTER — Other Ambulatory Visit: Payer: Self-pay | Admitting: *Deleted

## 2011-05-25 ENCOUNTER — Ambulatory Visit: Payer: BC Managed Care – PPO | Admitting: Internal Medicine

## 2011-06-10 ENCOUNTER — Ambulatory Visit: Payer: BC Managed Care – PPO | Admitting: Internal Medicine

## 2011-08-13 ENCOUNTER — Ambulatory Visit: Payer: BC Managed Care – PPO | Admitting: Internal Medicine

## 2011-08-28 ENCOUNTER — Ambulatory Visit (INDEPENDENT_AMBULATORY_CARE_PROVIDER_SITE_OTHER): Payer: BC Managed Care – PPO | Admitting: *Deleted

## 2011-08-28 ENCOUNTER — Other Ambulatory Visit: Payer: Self-pay | Admitting: *Deleted

## 2011-08-28 DIAGNOSIS — E785 Hyperlipidemia, unspecified: Secondary | ICD-10-CM

## 2011-08-28 DIAGNOSIS — Z0000 Encounter for general adult medical examination without abnormal findings: Secondary | ICD-10-CM

## 2011-08-28 LAB — LIPID PANEL
Cholesterol: 245 mg/dL — ABNORMAL HIGH (ref 0–200)
Total CHOL/HDL Ratio: 6
Triglycerides: 157 mg/dL — ABNORMAL HIGH (ref 0.0–149.0)

## 2011-08-28 LAB — CBC WITH DIFFERENTIAL/PLATELET
Eosinophils Absolute: 0.1 10*3/uL (ref 0.0–0.7)
Eosinophils Relative: 1.3 % (ref 0.0–5.0)
HCT: 41.2 % (ref 36.0–46.0)
Lymphs Abs: 1.7 10*3/uL (ref 0.7–4.0)
MCHC: 34 g/dL (ref 30.0–36.0)
MCV: 92.8 fl (ref 78.0–100.0)
Monocytes Absolute: 0.4 10*3/uL (ref 0.1–1.0)
Neutrophils Relative %: 62.9 % (ref 43.0–77.0)
Platelets: 221 10*3/uL (ref 150.0–400.0)
RDW: 13.2 % (ref 11.5–14.6)
WBC: 6 10*3/uL (ref 4.5–10.5)

## 2011-08-28 LAB — HEPATIC FUNCTION PANEL
ALT: 19 U/L (ref 0–35)
AST: 19 U/L (ref 0–37)
Alkaline Phosphatase: 63 U/L (ref 39–117)
Bilirubin, Direct: 0.1 mg/dL (ref 0.0–0.3)
Total Bilirubin: 0.8 mg/dL (ref 0.3–1.2)

## 2011-08-28 LAB — LDL CHOLESTEROL, DIRECT: Direct LDL: 159.1 mg/dL

## 2011-08-28 LAB — BASIC METABOLIC PANEL
BUN: 19 mg/dL (ref 6–23)
Creatinine, Ser: 0.8 mg/dL (ref 0.4–1.2)
GFR: 73.25 mL/min (ref >60.00)

## 2011-08-28 LAB — URINALYSIS, ROUTINE W REFLEX MICROSCOPIC
Ketones, ur: NEGATIVE
Leukocytes, UA: NEGATIVE
Nitrite: NEGATIVE
Specific Gravity, Urine: 1.01 (ref 1.000–1.030)
Total Protein, Urine: NEGATIVE
pH: 6.5 (ref 5.0–8.0)

## 2011-08-28 LAB — TSH: TSH: 1.68 u[IU]/mL (ref 0.35–5.50)

## 2011-09-07 ENCOUNTER — Telehealth: Payer: Self-pay | Admitting: *Deleted

## 2011-09-07 ENCOUNTER — Ambulatory Visit (INDEPENDENT_AMBULATORY_CARE_PROVIDER_SITE_OTHER): Payer: Managed Care, Other (non HMO) | Admitting: Internal Medicine

## 2011-09-07 ENCOUNTER — Ambulatory Visit (INDEPENDENT_AMBULATORY_CARE_PROVIDER_SITE_OTHER)
Admission: RE | Admit: 2011-09-07 | Discharge: 2011-09-07 | Disposition: A | Discharge status: Home / Self Care | Payer: Managed Care, Other (non HMO) | Source: Ambulatory Visit

## 2011-09-07 ENCOUNTER — Encounter: Payer: Self-pay | Admitting: Internal Medicine

## 2011-09-07 VITALS — BP 152/98 | HR 46 | Temp 97.2°F | Ht 66.75 in | Wt 196.4 lb

## 2011-09-07 DIAGNOSIS — Z0000 Encounter for general adult medical examination without abnormal findings: Secondary | ICD-10-CM

## 2011-09-07 DIAGNOSIS — E785 Hyperlipidemia, unspecified: Secondary | ICD-10-CM

## 2011-09-07 DIAGNOSIS — M7061 Trochanteric bursitis, right hip: Secondary | ICD-10-CM

## 2011-09-07 DIAGNOSIS — Z780 Asymptomatic menopausal state: Secondary | ICD-10-CM

## 2011-09-07 DIAGNOSIS — I10 Essential (primary) hypertension: Secondary | ICD-10-CM

## 2011-09-07 DIAGNOSIS — M76899 Other specified enthesopathies of unspecified lower limb, excluding foot: Secondary | ICD-10-CM

## 2011-09-07 NOTE — Telephone Encounter (Signed)
Message copied by Deatra James on Mon Sep 07, 2011  9:55 AM ------      Message from: COUSIN, SHARON T      Created: Mon Sep 07, 2011  9:17 AM      Regarding: PHY DATE   09/07/12       THANKS

## 2011-09-07 NOTE — Assessment & Plan Note (Signed)
The current medical regimen is effective;  continue present plan and medications.  BP Readings from Last 3 Encounters:  09/07/11 152/98  02/26/11 118/84  07/31/10 120/84

## 2011-09-07 NOTE — Assessment & Plan Note (Signed)
Pt declines statin due to myalgias and leg cramps on prior trials of Lipitor and Crestor  The patient is asked to make an attempt to improve diet and exercise patterns to aid in medical management of this problem.  

## 2011-09-07 NOTE — Telephone Encounter (Signed)
Received staff msg pt made cpx for 09/07/12. Need labs entered in epic... 09/07/11@9 :55am/LMB

## 2011-09-07 NOTE — Patient Instructions (Signed)
It was good to see you today. Health Maintenance reviewed - Consider the Shingles vaccine (Zostavax) as we discussed - all other recommended immunizations and age-appropriate screenings are up-to-date.  We have reviewed your prior records including labs and tests today Work on lifestyle changes as discussed (low fat, low carb, increased protein diet; improved exercise efforts; weight loss) to control sugar, blood pressure and cholesterol levels and/or reduce risk of developing other medical problems. Look into [email or web address] or other type of food journal to assist you in this process.  Use Flector patch and ice to your right hip as discussed and see exercises or stretches and call if worse or unimproved Please schedule followup in 12 months, call sooner if problems.   Trochanteric Bursitis You have hip pain due to trochanteric bursitis. Bursitis means that the sack near the outside of the hip is filled with fluid and inflamed. This sack is made up of protective soft tissue. The pain from trochanteric bursitis can be severe and keep you from sleep. It can radiate to the buttocks or down the outside of the thigh to the knee. The pain is almost always worse when rising from the seated or lying position and with walking. Pain can improve after you take a few steps. It happens more often in people with hip joint and lumbar spine problems, such as arthritis or previous surgery. Very rarely the trochanteric bursa can become infected, and antibiotics and/or surgery may be needed. Treatment often includes an injection of local anesthetic mixed with cortisone medicine. This medicine is injected into the area where it is most tender over the hip. Repeat injections may be necessary if the response to treatment is slow. You can apply ice packs over the tender area for 30 minutes every 2 hours for the next few days. Anti-inflammatory and/or narcotic pain medicine may also be helpful. Limit your activity for the next  few days if the pain continues. See your caregiver in 5-10 days if you are not greatly improved.   SEEK IMMEDIATE MEDICAL CARE IF:  You develop severe pain, fever, or increased redness.   You have pain that radiates below the knee.  EXERCISES STRETCHING EXERCISES - Trochantic Bursitis  These exercises may help you when beginning to rehabilitate your injury. Your symptoms may resolve with or without further involvement from your physician, physical therapist or athletic trainer. While completing these exercises, remember:    Restoring tissue flexibility helps normal motion to return to the joints. This allows healthier, less painful movement and activity.   An effective stretch should be held for at least 30 seconds.   A stretch should never be painful. You should only feel a gentle lengthening or release in the stretched tissue.  STRETCH - Iliotibial Band  On the floor or bed, lie on your side so your injured leg is on top. Bend your knee and grab your ankle.   Slowly bring your knee back so that your thigh is in line with your trunk. Keep your heel at your buttocks and gently arch your back so your head, shoulders and hips line up.   Slowly lower your leg so that your knee approaches the floor/bed until you feel a gentle stretch on the outside of your thigh. If you do not feel a stretch and your knee will not fall farther, place the heel of your opposite foot on top of your knee and pull your thigh down farther.   Hold this stretch for __________ seconds.   Repeat __________  times. Complete this exercise __________ times per day.  STRETCH - Hamstrings, Supine   Lie on your back. Loop a belt or towel over the ball of your foot as shown.   Straighten your knee and slowly pull on the belt to raise your injured leg. Do not allow the knee to bend. Keep your opposite leg flat on the floor.   Raise the leg until you feel a gentle stretch behind your knee or thigh. Hold this position for  __________ seconds.   Repeat __________ times. Complete this stretch __________ times per day.  STRETCH - Quadriceps, Prone   Lie on your stomach on a firm surface, such as a bed or padded floor.   Bend your knee and grasp your ankle. If you are unable to reach, your ankle or pant leg, use a belt around your foot to lengthen your reach.   Gently pull your heel toward your buttocks. Your knee should not slide out to the side. You should feel a stretch in the front of your thigh and/or knee.   Hold this position for __________ seconds.   Repeat __________ times. Complete this stretch __________ times per day.  STRETCHING - Hip Flexors, Lunge Half kneel with your knee on the floor and your opposite knee bent and directly over your ankle.  Keep good posture with your head over your shoulders. Tighten your buttocks to point your tailbone downward; this will prevent your back from arching too much.   You should feel a gentle stretch in the front of your thigh and/or hip. If you do not feel any resistance, slightly slide your opposite foot forward and then slowly lunge forward so your knee once again lines up over your ankle. Be sure your tailbone remains pointed downward.   Hold this stretch for __________ seconds.   Repeat __________ times. Complete this stretch __________ times per day.  STRETCH - Adductors, Lunge  While standing, spread your legs   Lean away from your injured leg by bending your opposite knee. You may rest your hands on your thigh for balance.   You should feel a stretch in your inner thigh. Hold for __________ seconds.   Repeat __________ times. Complete this exercise __________ times per day.  Document Released: 03/26/2004 Document Revised: 02/05/2011 Document Reviewed: 05/31/2008 [facility] Patient Information 2012 [place], [place].

## 2011-09-07 NOTE — Progress Notes (Signed)
Subjective:    Patient ID: Bailey [name], female    DOB: Jul 30, 1950, 61 y.o.   MRN: [id number]  HPI   patient is here today for annual physical. Patient feels well overall.  Also reviewed chronic medical issues: HTN - the patient reports compliance with medication(s) as prescribed. Denies adverse side effects. Dyslipidemia - prev rx'd statin but not taking due to leg cramps/aches Recurrent pain over R hip - located external/lateral side - aggravated by direct pressure(lying in bed) - denies radiation - denies precipitating injury - hx same 2010 and seen by ortho - declined steroid injection then, ?resume flector patch as tx as that time  Past Medical History  Diagnosis Date  . POSTMENOPAUSAL STATUS   . DYSLIPIDEMIA   . HYPERTENSION    Family History  Problem Relation Age of Onset  . Hypertension Father    History  Substance Use Topics  . Smoking status: Never Smoker   . Smokeless tobacco: Not on file   Comment: exposed to second hand (spouse smokes), Married  . Alcohol Use: No    Review of Systems  Constitutional: Negative for fever.  Respiratory: Negative for cough and shortness of breath.   Cardiovascular: Negative for chest pain or palpitations.  Gastrointestinal: Negative for abdominal pain.  Musculoskeletal: Negative for gait problem.  Skin: Negative for rash.  Neurological: Negative for dizziness.  No other specific complaints in a complete review of systems (except as listed in HPI above).     Objective:   Physical Exam  BP 152/98  Pulse 46  Temp 97.2 F (36.2 C) (Oral)  Ht 5' 6.75" (1.695 m)  Wt 196 lb 6.4 oz (89.086 kg)  BMI 30.99 kg/m2  SpO2 97% Wt Readings from Last 3 Encounters:  09/07/11 196 lb 6.4 oz (89.086 kg)  07/31/10 199 lb (90.266 kg)  10/01/09 199 lb 8 oz (90.493 kg)   Constitutional: She appears well-developed and well-nourished. No distress.  HENT: Head: Normocephalic and atraumatic. Ears: B TMs ok, no erythema or effusion; Nose:  Nose normal.  Mouth/Throat: Oropharynx is clear and moist. No oropharyngeal exudate.  Eyes: Conjunctivae and EOM are normal. Pupils are equal, round, and reactive to light. No scleral icterus.  Neck: Normal range of motion. Neck supple. No JVD present. No thyromegaly present.  Cardiovascular: Normal rate, regular rhythm and normal heart sounds.  No murmur heard. No BLE edema. Pulmonary/Chest: Effort normal and breath sounds normal. No respiratory distress. She has no wheezes.  Abdominal: Soft. Bowel sounds are normal. She exhibits no distension. There is no tenderness. no masses Musculoskeletal: Normal range of motion, no joint effusions. No gross deformities Neurological: She is alert and oriented to person, place, and time. No cranial nerve deficit. Coordination normal.  Skin: Skin is warm and dry. No rash noted. No erythema.  Psychiatric: She has a normal mood and affect. Her behavior is normal. Judgment and thought content normal.    Lab Results  Component Value Date   WBC 6.0 08/28/2011   HGB 14.0 08/28/2011   HCT 41.2 08/28/2011   PLT 221.0 08/28/2011   CHOL 245* 08/28/2011   TRIG 157.0* 08/28/2011   HDL 43.90 08/28/2011   LDLDIRECT 159.1 08/28/2011   ALT 19 08/28/2011   AST 19 08/28/2011   NA 139 08/28/2011   K 4.2 08/28/2011   CL 102 08/28/2011   CREATININE 0.8 08/28/2011   BUN 19 08/28/2011   CO2 30 08/28/2011   TSH 1.68 08/28/2011       WUJ:WJXBJ @  44bpm, rate variation noted - unchanged from 07/31/10 and 09/27/08 ecg's  Assessment & Plan:  CPX - v70.0- Patient has been counseled on age-appropriate routine health concerns for screening and prevention. These are reviewed and up-to-date. Immunizations are up-to-date or declined. Labs and ECG reviewed.  Also See problem list. Medications and labs reviewed today.

## 2011-09-07 NOTE — Assessment & Plan Note (Signed)
Will check bone density now

## 2011-09-07 NOTE — Assessment & Plan Note (Signed)
Episodic flares of pain since 2010 - prior eval by ortho Lestine Box) for same and declined steroid injection in 2010 Prior relief with flector patch - ok to resume same prn and call if refill needed or persisting pain

## 2011-12-28 ENCOUNTER — Ambulatory Visit: Payer: Managed Care, Other (non HMO) | Admitting: Internal Medicine

## 2012-01-06 ENCOUNTER — Ambulatory Visit (INDEPENDENT_AMBULATORY_CARE_PROVIDER_SITE_OTHER)
Admission: RE | Admit: 2012-01-06 | Discharge: 2012-01-06 | Disposition: A | Discharge status: Home / Self Care | Payer: Managed Care, Other (non HMO) | Source: Ambulatory Visit | Attending: Internal Medicine | Admitting: Internal Medicine

## 2012-01-06 ENCOUNTER — Ambulatory Visit (INDEPENDENT_AMBULATORY_CARE_PROVIDER_SITE_OTHER): Payer: Managed Care, Other (non HMO) | Admitting: Internal Medicine

## 2012-01-06 ENCOUNTER — Encounter: Payer: Self-pay | Admitting: Internal Medicine

## 2012-01-06 VITALS — BP 128/94 | HR 62 | Temp 97.3°F | Ht 66.75 in | Wt 194.6 lb

## 2012-01-06 DIAGNOSIS — M67919 Unspecified disorder of synovium and tendon, unspecified shoulder: Secondary | ICD-10-CM

## 2012-01-06 DIAGNOSIS — M75101 Unspecified rotator cuff tear or rupture of right shoulder, not specified as traumatic: Secondary | ICD-10-CM

## 2012-01-06 DIAGNOSIS — M719 Bursopathy, unspecified: Secondary | ICD-10-CM

## 2012-01-06 MED ORDER — METHOCARBAMOL 500 MG PO TABS: 500.0000 mg | ORAL_TABLET | Freq: Three times a day (TID) | ORAL | Status: DC

## 2012-01-06 MED ORDER — DICLOFENAC SODIUM 75 MG PO TBEC: 75.0000 mg | DELAYED_RELEASE_TABLET | Freq: Two times a day (BID) | ORAL | Status: DC

## 2012-01-06 MED ORDER — KETOROLAC TROMETHAMINE 60 MG/2ML IM SOLN
60.0000 mg | Freq: Once | INTRAMUSCULAR | Status: AC
  Administered 2012-01-06: 60 mg via INTRAMUSCULAR

## 2012-01-06 NOTE — Progress Notes (Signed)
  Subjective:    Patient ID: Bailey [name], female    DOB: Oct 25, 1950, 61 y.o.   MRN: [id number]  Arm Pain  The incident occurred more than 1 week ago (12/10/11). The incident occurred at home. The injury mechanism was repetitive motion. The pain is present in the right shoulder. The quality of the pain is described as aching, shooting and stabbing. Radiates to: toward but not below elbow on R side. The pain is at a severity of 8/10. The pain is severe. The pain has been constant since the incident. Pertinent negatives include no chest pain, muscle weakness, numbness or tingling. The symptoms are aggravated by lifting and movement. She has tried NSAIDs and immobilization for the symptoms. The treatment provided no relief.    Past Medical History  Diagnosis Date  . POSTMENOPAUSAL STATUS   . DYSLIPIDEMIA   . HYPERTENSION     Review of Systems  Constitutional: Negative for fever and fatigue.  HENT: Positive for neck stiffness. Negative for neck pain.   Cardiovascular: Negative for chest pain.  Musculoskeletal: Negative for back pain.  Neurological: Negative for tingling, weakness, numbness and headaches.       Objective:   Physical Exam BP 128/94  Pulse 62  Temp 97.3 F (36.3 C) (Oral)  Ht 5' 6.75" (1.695 m)  Wt 194 lb 9.6 oz (88.27 kg)  BMI 30.71 kg/m2  SpO2 97% Wt Readings from Last 3 Encounters:  01/06/12 194 lb 9.6 oz (88.27 kg)  09/07/11 196 lb 6.4 oz (89.086 kg)  07/31/10 199 lb (90.266 kg)   Gen: NAD, but tearful with pain on exam Neck: FROM, muscle spasm and myofascial tenderness along right trapezius - CV: RRR, no edema Lungs: CTA B MSkel: R shoulder - decreased range of motion on forward flexion, abduction, and internal rotation. Positive impingement signs. Decreased strength with stressing of rotator cuff. Pain with crossed arm adduction. referred pain into distal deltoid. Tender over a.c. joint and subacromial.  Lab Results  Component Value Date   WBC 6.0  08/28/2011   HGB 14.0 08/28/2011   HCT 41.2 08/28/2011   PLT 221.0 08/28/2011   GLUCOSE 99 08/28/2011   CHOL 245* 08/28/2011   TRIG 157.0* 08/28/2011   HDL 43.90 08/28/2011   LDLDIRECT 159.1 08/28/2011   LDLCALC 116* 01/04/2009   ALT 19 08/28/2011   AST 19 08/28/2011   NA 139 08/28/2011   K 4.2 08/28/2011   CL 102 08/28/2011   CREATININE 0.8 08/28/2011   BUN 19 08/28/2011   CO2 30 08/28/2011   TSH 1.68 08/28/2011        Assessment & Plan:   R shoulder pain - RTC syndrome on exam -  Toradol 60mg  IM today Check xray start oral NSAIDs as pt declines injection today add robaxin qhs and use heating pad Refer for PT Ortho refer if unimproved in next 2 weeks, pt will call sooner if worse, to reconsider steroid injection

## 2012-01-06 NOTE — Patient Instructions (Signed)
It was good to see you today. Toradol shot given to you for pain today Test(s) ordered today. Your results will be released to MyChart (or called to you) after review, usually within 72hours after test completion. If any changes need to be made, you will be notified at that same time. Take Voltaren 1 tablet twice a day with food for the next 2 weeks, also use Robaxin especially at night for muscle relaxer to relieve pain - Your prescription(s) have been submitted to your pharmacy. Please take as directed and contact our office if you believe you are having problem(s) with the medication(s). Heating pad as needed we'll make referral to physical therapy for your rotator cuff pain. Our office will contact you regarding appointment(s) once made. Call if pain unimproved in next 2 weeks for referral to orthopedics as discussed, call sooner if worse

## 2012-01-25 ENCOUNTER — Other Ambulatory Visit: Payer: Self-pay | Admitting: Internal Medicine

## 2012-02-04 ENCOUNTER — Encounter: Payer: Self-pay | Admitting: Internal Medicine

## 2012-02-04 ENCOUNTER — Ambulatory Visit (INDEPENDENT_AMBULATORY_CARE_PROVIDER_SITE_OTHER): Payer: Managed Care, Other (non HMO) | Admitting: Internal Medicine

## 2012-02-04 VITALS — BP 152/90 | HR 58 | Temp 97.3°F

## 2012-02-04 DIAGNOSIS — M7541 Impingement syndrome of right shoulder: Secondary | ICD-10-CM

## 2012-02-04 NOTE — Patient Instructions (Signed)
It was good to see you today. Continue physical therapy once per week and same medications - Voltaren and muscle relaxers we'll make referral to orthopedist to see if injection will help or if MRI is needed . Our office will contact you regarding appointment(s) once made.

## 2012-02-04 NOTE — Progress Notes (Signed)
  Subjective:    Patient ID: Bailey [name], female    DOB: 29-Jul-1950, 61 y.o.   MRN: [id number]  Arm Pain  The incident occurred more than 1 week ago (12/10/11). The incident occurred at home. The injury mechanism was repetitive motion. The pain is present in the right shoulder. The quality of the pain is described as aching, shooting and stabbing. Radiates to: toward but not below elbow on R side. The pain is at a severity of 8/10. The pain is severe. The pain has been constant since the incident. Pertinent negatives include no chest pain, muscle weakness, numbness or tingling. The symptoms are aggravated by lifting and movement. She has tried NSAIDs and immobilization for the symptoms. The treatment provided no relief.    Past Medical History  Diagnosis Date  . POSTMENOPAUSAL STATUS   . DYSLIPIDEMIA   . HYPERTENSION     Review of Systems  Constitutional: Negative for fever and fatigue.  HENT: Negative for neck pain and neck stiffness.   Cardiovascular: Negative for chest pain.  Musculoskeletal: Negative for back pain and joint swelling.  Neurological: Negative for tingling, weakness, numbness and headaches.       Objective:   Physical Exam  BP 152/90  Pulse 58  Temp 97.3 F (36.3 C) (Oral)  SpO2 95% Wt Readings from Last 3 Encounters:  01/06/12 194 lb 9.6 oz (88.27 kg)  09/07/11 196 lb 6.4 oz (89.086 kg)  07/31/10 199 lb (90.266 kg)   Gen: NAD, but tearful with pain on exam Neck: FROM, no significant muscle spasm or myofascial tenderness  CV: RRR, no edema Lungs: CTA B MSkel: R shoulder - decreased range of motion on forward flexion, abduction, and internal rotation. +Apprehension sign. Positive impingement signs. Decreased strength with stressing of rotator cuff. Pain with crossed arm adduction. referred pain into distal deltoid. Tender over a.c. joint and subacromial.  Lab Results  Component Value Date   WBC 6.0 08/28/2011   HGB 14.0 08/28/2011   HCT 41.2 08/28/2011    PLT 221.0 08/28/2011   GLUCOSE 99 08/28/2011   CHOL 245* 08/28/2011   TRIG 157.0* 08/28/2011   HDL 43.90 08/28/2011   LDLDIRECT 159.1 08/28/2011   LDLCALC 116* 01/04/2009   ALT 19 08/28/2011   AST 19 08/28/2011   NA 139 08/28/2011   K 4.2 08/28/2011   CL 102 08/28/2011   CREATININE 0.8 08/28/2011   BUN 19 08/28/2011   CO2 30 08/28/2011   TSH 1.68 08/28/2011   Dg Shoulder Right  01/06/2012  *RADIOLOGY REPORT*  Clinical Data: Shoulder pain.  Rule out DJD.  RIGHT SHOULDER - 2+ VIEW  Comparison: None.  Findings: There is mild degenerative change involving the acromioclavicular joint.  The glenohumeral joint appears normal. No fracture or subluxation identified.  No radio-opaque foreign bodies or soft tissue calcifications.  IMPRESSION:  1.  Mild AC joint osteoarthritis. 2.  Normal appearance of the glenohumeral joint.   Original Report Authenticated By: Signa Kell, M.D.       Assessment & Plan:   R shoulder pain - impingement syndrome vs RTC on exam -  ?worse following 1 month PT and conservative care continue oral NSAIDs and muscle relaxer as pt declines injection again today Refer to Ortho now - ?steroid injection and/or MRI

## 2012-04-15 ENCOUNTER — Other Ambulatory Visit: Payer: Self-pay | Admitting: Orthopedic Surgery

## 2012-04-19 ENCOUNTER — Encounter (HOSPITAL_BASED_OUTPATIENT_CLINIC_OR_DEPARTMENT_OTHER): Payer: Self-pay | Admitting: *Deleted

## 2012-04-19 NOTE — Progress Notes (Signed)
Pt has never had to see cardiology-denies any resp problems Will bring all meds and overnight back just in case she needs to stay and does not live close. Needs istat

## 2012-04-22 ENCOUNTER — Encounter (HOSPITAL_BASED_OUTPATIENT_CLINIC_OR_DEPARTMENT_OTHER): Payer: Self-pay | Admitting: Anesthesiology

## 2012-04-22 ENCOUNTER — Encounter (HOSPITAL_BASED_OUTPATIENT_CLINIC_OR_DEPARTMENT_OTHER)
Admission: RE | Disposition: A | Discharge status: Home / Self Care | Payer: Self-pay | Source: Ambulatory Visit | Attending: Orthopedic Surgery

## 2012-04-22 ENCOUNTER — Ambulatory Visit (HOSPITAL_BASED_OUTPATIENT_CLINIC_OR_DEPARTMENT_OTHER)
Admission: RE | Admit: 2012-04-22 | Discharge: 2012-04-22 | Disposition: A | Discharge status: Home / Self Care | Payer: Managed Care, Other (non HMO) | Source: Ambulatory Visit | Attending: Orthopedic Surgery | Admitting: Orthopedic Surgery

## 2012-04-22 ENCOUNTER — Ambulatory Visit (HOSPITAL_BASED_OUTPATIENT_CLINIC_OR_DEPARTMENT_OTHER): Payer: Managed Care, Other (non HMO) | Admitting: Anesthesiology

## 2012-04-22 DIAGNOSIS — M25819 Other specified joint disorders, unspecified shoulder: Secondary | ICD-10-CM | POA: Insufficient documentation

## 2012-04-22 DIAGNOSIS — I10 Essential (primary) hypertension: Secondary | ICD-10-CM | POA: Insufficient documentation

## 2012-04-22 DIAGNOSIS — M75120 Complete rotator cuff tear or rupture of unspecified shoulder, not specified as traumatic: Secondary | ICD-10-CM | POA: Insufficient documentation

## 2012-04-22 DIAGNOSIS — E785 Hyperlipidemia, unspecified: Secondary | ICD-10-CM | POA: Insufficient documentation

## 2012-04-22 DIAGNOSIS — M75100 Unspecified rotator cuff tear or rupture of unspecified shoulder, not specified as traumatic: Secondary | ICD-10-CM

## 2012-04-22 DIAGNOSIS — S46819A Strain of other muscles, fascia and tendons at shoulder and upper arm level, unspecified arm, initial encounter: Secondary | ICD-10-CM

## 2012-04-22 DIAGNOSIS — S4380XA Sprain of other specified parts of unspecified shoulder girdle, initial encounter: Secondary | ICD-10-CM

## 2012-04-22 HISTORY — DX: Unspecified osteoarthritis, unspecified site: M19.90

## 2012-04-22 HISTORY — DX: Noninfective gastroenteritis and colitis, unspecified: K52.9

## 2012-04-22 HISTORY — DX: Sprain of other specified parts of unspecified shoulder girdle, initial encounter: S43.80XA

## 2012-04-22 HISTORY — DX: Unspecified rotator cuff tear or rupture of unspecified shoulder, not specified as traumatic: M75.100

## 2012-04-22 HISTORY — PX: SHOULDER ARTHROSCOPY WITH ROTATOR CUFF REPAIR AND SUBACROMIAL DECOMPRESSION: SHX5686

## 2012-04-22 HISTORY — DX: Strain of other muscles, fascia and tendons at shoulder and upper arm level, unspecified arm, initial encounter: S46.819A

## 2012-04-22 LAB — POCT I-STAT, CHEM 8
BUN: 15 mg/dL (ref 6–23)
Calcium, Ion: 1.29 mmol/L (ref 1.13–1.30)
Chloride: 105 mEq/L (ref 96–112)
HCT: 43 % (ref 36.0–46.0)
Potassium: 3.7 mEq/L (ref 3.5–5.1)
Sodium: 141 mEq/L (ref 135–145)

## 2012-04-22 SURGERY — SHOULDER ARTHROSCOPY WITH ROTATOR CUFF REPAIR AND SUBACROMIAL DECOMPRESSION
Anesthesia: General | Site: Shoulder | Laterality: Right | Wound class: Clean

## 2012-04-22 MED ORDER — LACTATED RINGERS IV SOLN
INTRAVENOUS | Status: DC | PRN
  Administered 2012-04-22 (×2): via INTRAVENOUS

## 2012-04-22 MED ORDER — BUPIVACAINE HCL (PF) 0.5 % IJ SOLN
INTRAMUSCULAR | Status: DC | PRN
  Administered 2012-04-22: 15 mL

## 2012-04-22 MED ORDER — MIDAZOLAM HCL 2 MG/2ML IJ SOLN: 1.0000 mg | INTRAMUSCULAR | Status: DC | PRN

## 2012-04-22 MED ORDER — METOCLOPRAMIDE HCL 5 MG/ML IJ SOLN: 10.0000 mg | Freq: Once | INTRAMUSCULAR | Status: DC | PRN

## 2012-04-22 MED ORDER — SUCCINYLCHOLINE CHLORIDE 20 MG/ML IJ SOLN
INTRAMUSCULAR | Status: DC | PRN
  Administered 2012-04-22: 120 mg via INTRAVENOUS

## 2012-04-22 MED ORDER — SODIUM CHLORIDE 0.9 % IR SOLN
Status: DC | PRN
  Administered 2012-04-22: 21000 mL

## 2012-04-22 MED ORDER — LACTATED RINGERS IV SOLN
INTRAVENOUS | Status: DC
  Administered 2012-04-22: 07:00:00 via INTRAVENOUS

## 2012-04-22 MED ORDER — PROPOFOL 10 MG/ML IV BOLUS
INTRAVENOUS | Status: DC | PRN
  Administered 2012-04-22: 200 mg via INTRAVENOUS

## 2012-04-22 MED ORDER — OXYCODONE-ACETAMINOPHEN 10-325 MG PO TABS
1.0000 | ORAL_TABLET | Freq: Four times a day (QID) | ORAL | Status: DC | PRN
Start: 2012-04-22 — End: 2012-07-27

## 2012-04-22 MED ORDER — OXYCODONE HCL 5 MG/5ML PO SOLN: 5.0000 mg | Freq: Once | ORAL | Status: DC | PRN

## 2012-04-22 MED ORDER — HYDROMORPHONE HCL PF 1 MG/ML IJ SOLN: 0.2500 mg | INTRAMUSCULAR | Status: DC | PRN

## 2012-04-22 MED ORDER — FENTANYL CITRATE 0.05 MG/ML IJ SOLN
50.0000 ug | INTRAMUSCULAR | Status: DC | PRN
  Administered 2012-04-22: 100 ug via INTRAVENOUS

## 2012-04-22 MED ORDER — EPHEDRINE SULFATE 50 MG/ML IJ SOLN
INTRAMUSCULAR | Status: DC | PRN
  Administered 2012-04-22: 10 mg via INTRAVENOUS

## 2012-04-22 MED ORDER — MIDAZOLAM HCL 2 MG/2ML IJ SOLN
1.0000 mg | INTRAMUSCULAR | Status: DC | PRN
  Administered 2012-04-22: 2 mg via INTRAVENOUS

## 2012-04-22 MED ORDER — DEXAMETHASONE SODIUM PHOSPHATE 4 MG/ML IJ SOLN
INTRAMUSCULAR | Status: DC | PRN
  Administered 2012-04-22: 10 mg via INTRAVENOUS

## 2012-04-22 MED ORDER — ONDANSETRON HCL 4 MG/2ML IJ SOLN
INTRAMUSCULAR | Status: DC | PRN
  Administered 2012-04-22: 4 mg via INTRAVENOUS

## 2012-04-22 MED ORDER — OXYCODONE HCL 5 MG PO TABS: 5.0000 mg | ORAL_TABLET | Freq: Once | ORAL | Status: DC | PRN

## 2012-04-22 MED ORDER — CEFAZOLIN SODIUM-DEXTROSE 2-3 GM-% IV SOLR
2.0000 g | INTRAVENOUS | Status: AC
  Administered 2012-04-22: 2 g via INTRAVENOUS

## 2012-04-22 MED ORDER — FENTANYL CITRATE 0.05 MG/ML IJ SOLN: 50.0000 ug | INTRAMUSCULAR | Status: DC | PRN

## 2012-04-22 MED ORDER — METHOCARBAMOL 500 MG PO TABS: 500.0000 mg | ORAL_TABLET | Freq: Three times a day (TID) | ORAL | Status: DC

## 2012-04-22 MED ORDER — PROMETHAZINE HCL 25 MG PO TABS: 25.0000 mg | ORAL_TABLET | Freq: Four times a day (QID) | ORAL | Status: DC | PRN

## 2012-04-22 MED ORDER — LIDOCAINE HCL 4 % MT SOLN
OROMUCOSAL | Status: DC | PRN
  Administered 2012-04-22: 4 mL via TOPICAL

## 2012-04-22 MED ORDER — LIDOCAINE HCL (CARDIAC) 20 MG/ML IV SOLN
INTRAVENOUS | Status: DC | PRN
  Administered 2012-04-22: 50 mg via INTRAVENOUS

## 2012-04-22 SURGICAL SUPPLY — 70 items
ANCH SUT SWLK 19.1X4.75 (Anchor) ×2 IMPLANT
ANCHOR SUT BIO SW 4.75X19.1 (Anchor) ×2 IMPLANT
APL SKNCLS STERI-STRIP NONHPOA (GAUZE/BANDAGES/DRESSINGS) ×1
BENZOIN TINCTURE PRP APPL 2/3 (GAUZE/BANDAGES/DRESSINGS) ×2 IMPLANT
BLADE CUTTER GATOR 3.5 (BLADE) ×2 IMPLANT
BLADE GREAT WHITE 4.2 (BLADE) IMPLANT
BLADE SURG 15 STRL LF DISP TIS (BLADE) IMPLANT
BLADE SURG 15 STRL SS (BLADE)
BUR OVAL 4.0 (BURR) ×1 IMPLANT
BUR OVAL 4.0X59 (BURR) IMPLANT
BUR OVAL 6.0 (BURR) IMPLANT
CANISTER OMNI JUG 16 LITER (MISCELLANEOUS) ×2 IMPLANT
CANNULA 5.75X71 LONG (CANNULA) ×2 IMPLANT
CANNULA TWIST IN 8.25X7CM (CANNULA) ×1 IMPLANT
CLOTH BEACON ORANGE TIMEOUT ST (SAFETY) ×2 IMPLANT
DECANTER SPIKE VIAL GLASS SM (MISCELLANEOUS) IMPLANT
DRAPE INCISE IOBAN 66X45 STRL (DRAPES) ×2 IMPLANT
DRAPE SHOULDER BEACH CHAIR (DRAPES) ×2 IMPLANT
DRAPE U 20/CS (DRAPES) ×2 IMPLANT
DRAPE U-SHAPE 47X51 STRL (DRAPES) ×2 IMPLANT
DRSG PAD ABDOMINAL 8X10 ST (GAUZE/BANDAGES/DRESSINGS) ×2 IMPLANT
DURAPREP 26ML APPLICATOR (WOUND CARE) ×2 IMPLANT
ELECT REM PT RETURN 9FT ADLT (ELECTROSURGICAL)
ELECTRODE REM PT RTRN 9FT ADLT (ELECTROSURGICAL) ×1 IMPLANT
FIBERSTICK 2 (SUTURE) ×1 IMPLANT
GLOVE BIO SURGEON STRL SZ 6.5 (GLOVE) ×1 IMPLANT
GLOVE BIO SURGEON STRL SZ8 (GLOVE) ×2 IMPLANT
GLOVE BIOGEL PI IND STRL 7.0 (GLOVE) IMPLANT
GLOVE BIOGEL PI IND STRL 8 (GLOVE) ×2 IMPLANT
GLOVE BIOGEL PI INDICATOR 7.0 (GLOVE) ×1
GLOVE BIOGEL PI INDICATOR 8 (GLOVE) ×2
GLOVE EXAM NITRILE EXT CUFF MD (GLOVE) ×1 IMPLANT
GLOVE ORTHO TXT STRL SZ7.5 (GLOVE) ×2 IMPLANT
GOWN BRE IMP PREV XXLGXLNG (GOWN DISPOSABLE) ×4 IMPLANT
IMMOBILIZER SHOULDER XLGE (ORTHOPEDIC SUPPLIES) IMPLANT
IV NS IRRIG 3000ML ARTHROMATIC (IV SOLUTION) ×4 IMPLANT
KIT SHOULDER TRACTION (DRAPES) ×2 IMPLANT
LASSO SUT 90 DEGREE (SUTURE) IMPLANT
NDL SCORPION MULTI FIRE (NEEDLE) IMPLANT
NEEDLE SCORPION MULTI FIRE (NEEDLE) ×2 IMPLANT
PACK ARTHROSCOPY DSU (CUSTOM PROCEDURE TRAY) ×2 IMPLANT
PACK BASIN DAY SURGERY FS (CUSTOM PROCEDURE TRAY) ×2 IMPLANT
SET ARTHROSCOPY TUBING (MISCELLANEOUS) ×2
SET ARTHROSCOPY TUBING LN (MISCELLANEOUS) ×1 IMPLANT
SHEET MEDIUM DRAPE 40X70 STRL (DRAPES) ×2 IMPLANT
SLEEVE SCD COMPRESS KNEE MED (MISCELLANEOUS) ×2 IMPLANT
SLING ARM FOAM STRAP LRG (SOFTGOODS) IMPLANT
SLING ARM FOAM STRAP MED (SOFTGOODS) IMPLANT
SLING ARM FOAM STRAP XLG (SOFTGOODS) IMPLANT
SLING ARM IMMOBILIZER LRG (SOFTGOODS) ×1 IMPLANT
SLING ARM IMMOBILIZER MED (SOFTGOODS) IMPLANT
SPONGE GAUZE 4X4 12PLY (GAUZE/BANDAGES/DRESSINGS) ×2 IMPLANT
STRIP CLOSURE SKIN 1/2X4 (GAUZE/BANDAGES/DRESSINGS) ×2 IMPLANT
SUT FIBERWIRE #2 38 T-5 BLUE (SUTURE) ×2
SUT LASSO 45 DEGREE (SUTURE) ×1 IMPLANT
SUT LASSO 45 DEGREE LEFT (SUTURE) IMPLANT
SUT LASSO 45D RIGHT (SUTURE) IMPLANT
SUT MNCRL AB 4-0 PS2 18 (SUTURE) ×1 IMPLANT
SUT PDS AB 1 CT  36 (SUTURE)
SUT PDS AB 1 CT 36 (SUTURE) IMPLANT
SUT TIGER TAPE 7 IN WHITE (SUTURE) IMPLANT
SUT VIC AB 3-0 SH 27 (SUTURE)
SUT VIC AB 3-0 SH 27X BRD (SUTURE) IMPLANT
SUTURE FIBERWR #2 38 T-5 BLUE (SUTURE) IMPLANT
TAPE FIBER 2MM 7IN #2 BLUE (SUTURE) ×2 IMPLANT
TOWEL OR 17X24 6PK STRL BLUE (TOWEL DISPOSABLE) ×2 IMPLANT
TOWEL OR NON WOVEN STRL DISP B (DISPOSABLE) ×2 IMPLANT
TUBE CONNECTING 20X1/4 (TUBING) IMPLANT
WAND STAR VAC 90 (SURGICAL WAND) ×2 IMPLANT
WATER STERILE IRR 1000ML POUR (IV SOLUTION) ×2 IMPLANT

## 2012-04-22 NOTE — Anesthesia Postprocedure Evaluation (Signed)
Anesthesia Post Note  Patient: Bailey [name]  Procedure(s) Performed: Procedure(s) (LRB): SHOULDER ARTHROSCOPY WITH ROTATOR CUFF REPAIR AND SUBACROMIAL DECOMPRESSION (Right)  Anesthesia type: General  Patient location: PACU  Post pain: Pain level controlled  Post assessment: Patient's Cardiovascular Status Stable  Last Vitals:  Filed Vitals:   04/22/12 1145  BP: 158/80  Pulse: 60  Temp:   Resp: 14    Post vital signs: Reviewed and stable  Level of consciousness: alert  Complications: No apparent anesthesia complications

## 2012-04-22 NOTE — H&P (Signed)
PREOPERATIVE H&P  Chief Complaint: right shoulder impingement syndrome, complete rupture of rotator cuff   HPI: Bailey [name] is a 62 y.o. female who presents for preoperative history and physical with a diagnosis of right shoulder impingement syndrome, complete rupture of rotator cuff . Symptoms are rated as moderate to severe, and have been worsening.  This is significantly impairing activities of daily living.  She has elected for surgical management.   Past Medical History  Diagnosis Date  . POSTMENOPAUSAL STATUS   . DYSLIPIDEMIA   . HYPERTENSION   . Arthritis   . Colitis     hx colitis-gi bleed-2006   Past Surgical History  Procedure Laterality Date  . Abdominal hysterectomy  1987    Partial  . Cervical fusion  2002  . Appendectomy  2003  . Tubal ligation    . Hernia repair  1975    umb  . Colonoscopy     History   Social History  . Marital Status: Married    Spouse Name: N/A    Number of Children: N/A  . Years of Education: N/A   Social History Main Topics  . Smoking status: Never Smoker   . Smokeless tobacco: None     Comment: exposed to second hand (spouse smokes), Married  . Alcohol Use: No  . Drug Use: No  . Sexually Active: None   Other Topics Concern  . None   Social History Narrative  . None   Family History  Problem Relation Age of Onset  . Hypertension Father    Allergies  Allergen Reactions  . Codeine    Prior to Admission medications   Medication Sig Start Date End Date Taking? Authorizing Provider  acetaminophen (TYLENOL) 500 MG tablet Take 500 mg by mouth every 6 (six) hours as needed for pain.   Yes Historical Provider, MD  olmesartan-hydrochlorothiazide (BENICAR HCT) 20-12.5 MG per tablet Take 1 tablet by mouth 2 (two) times daily. 04/03/11  Yes Newt Lukes, MD  fluticasone (FLONASE) 50 MCG/ACT nasal spray Place 1 spray into the nose daily as needed. 07/31/10   Newt Lukes, MD  levocetirizine (XYZAL) 5 MG tablet TAKE 1  TABLET BY MOUTH EVERY DAY AS NEEDED 05/30/10   Newt Lukes, MD  methocarbamol (ROBAXIN) 500 MG tablet Take 1 tablet (500 mg total) by mouth 3 (three) times daily. At night and as needed for muscle relaxer 01/06/12   Newt Lukes, MD     Positive ROS: All other systems have been reviewed and were otherwise negative with the exception of those mentioned in the HPI and as above.  Physical Exam: General: Alert, no acute distress Cardiovascular: No pedal edema Respiratory: No cyanosis, no use of accessory musculature GI: No organomegaly, abdomen is soft and non-tender Skin: No lesions in the area of chief complaint Neurologic: Sensation intact distally Psychiatric: Patient is competent for consent with normal mood and affect Lymphatic: No axillary or cervical lymphadenopathy  MUSCULOSKELETAL: right shoulder pain with AROM of shoulder, limited overhead strength  Assessment: right shoulder impingement syndrome, complete rupture of rotator cuff   Plan: Plan for Procedure(s): SHOULDER ARTHROSCOPY WITH ROTATOR CUFF REPAIR AND SUBACROMIAL DECOMPRESSION  The risks benefits and alternatives were discussed with the patient including but not limited to the risks of nonoperative treatment, versus surgical intervention including infection, bleeding, nerve injury,  blood clots, cardiopulmonary complications, morbidity, mortality, among others, and they were willing to proceed.   Eulas Post, MD Cell [phone] Pager [phone]  [phone]  04/22/2012 7:35 AM

## 2012-04-22 NOTE — Anesthesia Preprocedure Evaluation (Signed)
Anesthesia Evaluation  Patient identified by MRN, date of birth, ID band Patient awake    Reviewed: Allergy & Precautions, H&P , NPO status , Patient's Chart, lab work & pertinent test results, reviewed documented beta blocker date and time   Airway Mallampati: II TM Distance: >3 FB Neck ROM: full    Dental   Pulmonary neg pulmonary ROS,  breath sounds clear to auscultation        Cardiovascular hypertension, On Medications Rhythm:regular     Neuro/Psych negative neurological ROS  negative psych ROS   GI/Hepatic negative GI ROS, Neg liver ROS,   Endo/Other  negative endocrine ROS  Renal/GU negative Renal ROS  negative genitourinary   Musculoskeletal   Abdominal   Peds  Hematology negative hematology ROS (+)   Anesthesia Other Findings See surgeon's H&P   Reproductive/Obstetrics negative OB ROS                           Anesthesia Physical Anesthesia Plan  ASA: II  Anesthesia Plan: General   Post-op Pain Management:    Induction: Intravenous  Airway Management Planned: Oral ETT  Additional Equipment:   Intra-op Plan:   Post-operative Plan: Extubation in OR  Informed Consent: I have reviewed the patients History and Physical, chart, labs and discussed the procedure including the risks, benefits and alternatives for the proposed anesthesia with the patient or authorized representative who has indicated his/her understanding and acceptance.   Dental Advisory Given  Plan Discussed with: CRNA and Surgeon  Anesthesia Plan Comments:         Anesthesia Quick Evaluation

## 2012-04-22 NOTE — Op Note (Signed)
04/22/2012  10:05 AM  PATIENT:  Bailey [name]    PRE-OPERATIVE DIAGNOSIS:  right shoulder impingement syndrome, complete rupture of rotator cuff   POST-OPERATIVE DIAGNOSIS:  Same  PROCEDURE:  SHOULDER ARTHROSCOPY WITH ROTATOR CUFF REPAIR INVOLVING THE SUPRASPINATUS AND SUBSCAPULARIS AND SUBACROMIAL DECOMPRESSION, debridement of biceps tendon, and bursectomy   SURGEON:  Eulas Post, MD  PHYSICIAN ASSISTANT: Janace Litten, OPA-C, present and scrubbed throughout the case, critical for completion in a timely fashion, and for retraction, instrumentation, and closure.  ANESTHESIA:   General  PREOPERATIVE INDICATIONS:  Bailey [name] is a  62 y.o. female with a diagnosis of right shoulder impingement syndrome, complete rupture of rotator cuff  who failed conservative measures and elected for surgical management.    The risks benefits and alternatives were discussed with the patient preoperatively including but not limited to the risks of infection, bleeding, nerve injury, cardiopulmonary complications, the need for revision surgery, among others, and the patient was willing to proceed.  we also discussed the risks for recurrent rupture, stiffness, loss of function, progression of rotator cuff arthropathy, inability to regain overhead strength, among others.  OPERATIVE IMPLANTS: Arthrex 4.75 mm swivel lock x1 for the subscapularis with an inverted FiberWire, with a second 4.75 mm swivel lock with an inverted fiber tape anteriorly and a #2 FiberWire in a margin convergence type fashion for a cleavage split in the infraspinatus and supraspinatus junction.  OPERATIVE FINDINGS: The glenohumeral articular cartilage is intact. The superior labrum was intact. The biceps pulley was intact. There is substantial tendinopathy with tearing of the subscapularis. The biceps tendon had approximately 30% fraying, particularly along the groove at the level of the humeral head. The superior labrum was intact as  was the posterior labrum. The middle glenohumeral ligament was intact. The supraspinatus had a large full-thickness tear, extending from the biceps posteriorly around with a cleavage split in the infraspinatus. The tendon was fairly thick, and quality reasonably good. Bone quality was also reasonably good.  OPERATIVE PROCEDURE: The patient was brought to the operating room and placed in the supine position. General anesthesia was administered. Time out was performed. Examination under anesthesia demonstrated full motion. She was turned into the semilateral decubitus position all bony prominences were padded. The right upper extremity was prepped and draped in usual sterile fashion. Diagnostic arthroscopy was carried out with the above-named findings. I used the arthroscopic shaver to debride the labrum, as well as the superior biceps, and the undersurface of the rotator cuff tear. I also debrided portions of the subscapularis. I then examined subscapularis closely, and found that the tendon had partially retracted and had significant tendinopathy and tearing at the insertion site. Therefore I mobilized the tendon, placed anterior cannulas, and then passed an inverted fiber tape was #2 through the subscapularis and then I also abraded the bony surface of the insertion with a bur, and then placed the suture into a swivel lock using the punch. Excellent fixation and restoration of soft tissue tension the subscapularis was achieved.  I then went to the subacromial space. Complete bursectomy was performed followed by debridement of the supraspinatus tendon, and then a pair the tuberosity with a bur. I also performed a CA ligament release, and removed the subacromial spur. This was moderate in size.  I examined the tear from laterally, and then placed the posterior suture with the bird beak through the posterior leaflet, and then switch viewing portals to posterior, and placed in the FiberWire using the scorpion  suture  passer through the posterior aspect of the supraspinatus.  I then placed a fiber tape through the anterior tendon, taking care to prevent penetration of the biceps tendon.  The superior cannula was placed, and I punched and placed all the sutures into a swivel lock. Excellent tension and restoration of the tendon to the bone was achieved.  I confirmed satisfactory acromioplasty from the lateral view, and then removed the arthroscopic instruments and closed the portals with Monocryl followed by Steri-Strips and sterile gauze. She was awakened and returned to the PACU in stable and satisfactory condition. There were no complications and she tolerated the procedure well.

## 2012-04-22 NOTE — Progress Notes (Signed)
Assisted Dr. Frederick with right, ultrasound guided, interscalene  block. Side rails up, monitors on throughout procedure. See vital signs in flow sheet. Tolerated Procedure well. 

## 2012-04-22 NOTE — Transfer of Care (Signed)
Immediate Anesthesia Transfer of Care Note  Patient: Bailey [name]  Procedure(s) Performed: Procedure(s) (LRB): SHOULDER ARTHROSCOPY WITH ROTATOR CUFF REPAIR AND SUBACROMIAL DECOMPRESSION (Right)  Patient Location: PACU  Anesthesia Type: General  Level of Consciousness:sleepy  Airway & Oxygen Therapy: Patient Spontanous Breathing and Patient connected to face mask oxygen, oral airway remaining  Post-op Assessment: Report given to PACU RN and Post -op Vital signs reviewed and stable  Post vital signs: Reviewed and stable  Complications: No apparent anesthesia complications

## 2012-04-22 NOTE — Anesthesia Procedure Notes (Addendum)
Anesthesia Regional Block:  Interscalene brachial plexus block  Pre-Anesthetic Checklist: ,, timeout performed, Correct Patient, Correct Site, Correct Laterality, Correct Procedure, Correct Position, site marked, Risks and benefits discussed,  Surgical consent,  Pre-op evaluation,  At surgeon's request and post-op pain management  Laterality: Right  Prep: chloraprep       Needles:   Needle Type: Other     Needle Length: 9cm  Needle Gauge: 21    Additional Needles:  Procedures: ultrasound guided (picture in chart) Interscalene brachial plexus block Narrative:  Start time: 04/22/2012 6:55 AM End time: 04/22/2012 7:03 AM Injection made incrementally with aspirations every 5 mL.  Performed by: Personally  Anesthesiologist: Aldona Lento, MD  Additional Notes: Ultrasound guidance used to: id relevant anatomy, confirm needle position, local anesthetic spread, avoidance of vascular puncture. Picture saved. No complications. Block performed personally by Janetta Hora. Gelene Mink, MD    Interscalene brachial plexus block Procedure Name: Intubation Date/Time: 04/22/2012 7:40 AM Performed by: Norva Pavlov Pre-anesthesia Checklist: Patient identified, Emergency Drugs available, Suction available and Patient being monitored Patient Re-evaluated:Patient Re-evaluated prior to inductionOxygen Delivery Method: Circle System Utilized Preoxygenation: Pre-oxygenation with 100% oxygen Intubation Type: IV induction Ventilation: Mask ventilation without difficulty Laryngoscope Size: Mac and 4 Tube type: Oral Tube size: 7.0 mm Number of attempts: 1 Airway Equipment and Method: stylet and LTA kit utilized Placement Confirmation: ETT inserted through vocal cords under direct vision,  positive ETCO2 and breath sounds checked- equal and bilateral Tube secured with: Tape Dental Injury: Teeth and Oropharynx as per pre-operative assessment

## 2012-04-25 ENCOUNTER — Encounter (HOSPITAL_BASED_OUTPATIENT_CLINIC_OR_DEPARTMENT_OTHER): Payer: Self-pay | Admitting: Orthopedic Surgery

## 2012-05-19 ENCOUNTER — Other Ambulatory Visit: Payer: Self-pay | Admitting: *Deleted

## 2012-05-19 MED ORDER — OLMESARTAN MEDOXOMIL-HCTZ 20-12.5 MG PO TABS: 1.0000 | ORAL_TABLET | Freq: Two times a day (BID) | ORAL | Status: DC

## 2012-05-26 ENCOUNTER — Other Ambulatory Visit: Payer: Self-pay | Admitting: Internal Medicine

## 2012-07-27 ENCOUNTER — Ambulatory Visit (INDEPENDENT_AMBULATORY_CARE_PROVIDER_SITE_OTHER): Payer: Managed Care, Other (non HMO) | Admitting: Internal Medicine

## 2012-07-27 ENCOUNTER — Encounter: Payer: Self-pay | Admitting: Internal Medicine

## 2012-07-27 ENCOUNTER — Ambulatory Visit (INDEPENDENT_AMBULATORY_CARE_PROVIDER_SITE_OTHER)
Admission: RE | Admit: 2012-07-27 | Discharge: 2012-07-27 | Disposition: A | Discharge status: Home / Self Care | Payer: Managed Care, Other (non HMO) | Source: Ambulatory Visit | Attending: Internal Medicine | Admitting: Internal Medicine

## 2012-07-27 ENCOUNTER — Other Ambulatory Visit (INDEPENDENT_AMBULATORY_CARE_PROVIDER_SITE_OTHER): Payer: Managed Care, Other (non HMO)

## 2012-07-27 VITALS — BP 130/92 | HR 63 | Temp 97.9°F | Wt 191.0 lb

## 2012-07-27 DIAGNOSIS — K5732 Diverticulitis of large intestine without perforation or abscess without bleeding: Secondary | ICD-10-CM

## 2012-07-27 DIAGNOSIS — R198 Other specified symptoms and signs involving the digestive system and abdomen: Secondary | ICD-10-CM

## 2012-07-27 DIAGNOSIS — K5792 Diverticulitis of intestine, part unspecified, without perforation or abscess without bleeding: Secondary | ICD-10-CM | POA: Insufficient documentation

## 2012-07-27 DIAGNOSIS — R1031 Right lower quadrant pain: Secondary | ICD-10-CM

## 2012-07-27 DIAGNOSIS — K8020 Calculus of gallbladder without cholecystitis without obstruction: Secondary | ICD-10-CM | POA: Insufficient documentation

## 2012-07-27 LAB — BASIC METABOLIC PANEL
BUN: 13 mg/dL (ref 6–23)
CO2: 29 mEq/L (ref 19–32)
Chloride: 102 mEq/L (ref 96–112)
Creatinine, Ser: 0.7 mg/dL (ref 0.4–1.2)
Glucose, Bld: 88 mg/dL (ref 70–99)

## 2012-07-27 LAB — URINALYSIS, ROUTINE W REFLEX MICROSCOPIC
Bilirubin Urine: NEGATIVE
Nitrite: NEGATIVE
Urine Glucose: NEGATIVE
Urobilinogen, UA: 0.2 (ref 0.0–1.0)

## 2012-07-27 LAB — HEPATIC FUNCTION PANEL
Alkaline Phosphatase: 81 U/L (ref 39–117)
Bilirubin, Direct: 0.1 mg/dL (ref 0.0–0.3)
Total Bilirubin: 0.9 mg/dL (ref 0.3–1.2)
Total Protein: 7.8 g/dL (ref 6.0–8.3)

## 2012-07-27 LAB — CBC WITH DIFFERENTIAL/PLATELET
Eosinophils Absolute: 0.1 10*3/uL (ref 0.0–0.7)
HCT: 39.9 % (ref 36.0–46.0)
Lymphs Abs: 2.5 10*3/uL (ref 0.7–4.0)
MCHC: 34.5 g/dL (ref 30.0–36.0)
MCV: 91.6 fl (ref 78.0–100.0)
Monocytes Absolute: 1 10*3/uL (ref 0.1–1.0)
Neutrophils Relative %: 69 % (ref 43.0–77.0)
Platelets: 257 10*3/uL (ref 150.0–400.0)

## 2012-07-27 MED ORDER — CIPROFLOXACIN HCL 500 MG PO TABS: 500.0000 mg | ORAL_TABLET | Freq: Two times a day (BID) | ORAL | Status: DC

## 2012-07-27 MED ORDER — IOHEXOL 300 MG/ML  SOLN
100.0000 mL | Freq: Once | INTRAMUSCULAR | Status: AC | PRN
  Administered 2012-07-27: 100 mL via INTRAVENOUS

## 2012-07-27 MED ORDER — HYDROCODONE-ACETAMINOPHEN 5-325 MG PO TABS: 1.0000 | ORAL_TABLET | Freq: Four times a day (QID) | ORAL | Status: DC | PRN

## 2012-07-27 MED ORDER — METRONIDAZOLE 500 MG PO TABS: 500.0000 mg | ORAL_TABLET | Freq: Three times a day (TID) | ORAL | Status: DC

## 2012-07-27 NOTE — Patient Instructions (Addendum)
It was good to see you today. Test(s) ordered today. Your results will be released to MyChart (or called to you) after review, usually within 72hours after test completion. If any changes need to be made, you will be notified at that same time. we'll make referral for CT scan this afternoon . Our office will contact you regarding appointment(s) once made. Cipro and Flagyl antibiotics x 7 days - also Norco if needed for pain Your prescription(s) have been submitted to your pharmacy. Please take as directed and contact our office if you believe you are having problem(s) with the medication(s). Further testing or treatment depends on these results and your symptoms

## 2012-07-27 NOTE — Progress Notes (Addendum)
Subjective:    Patient ID: Bailey [name], female    DOB: 10-28-1950, 62 y.o.   MRN: [id number]  Abdominal Pain This is a new problem. The current episode started in the past 7 days. The onset quality is gradual. The problem occurs intermittently. The problem has been waxing and waning. The pain is located in the rectum, LLQ and RLQ. The pain is severe. The quality of the pain is colicky, cramping and a sensation of fullness. The abdominal pain does not radiate. Associated symptoms include diarrhea and flatus (painful). Pertinent negatives include no anorexia, belching, constipation, dysuria, frequency, hematochezia, hematuria, melena, nausea, vomiting or weight loss. Fever: ? - felt flushed and chilled x 48h at beginning of illness. Nothing aggravates the pain. The pain is relieved by passing flatus and bowel movements. She has tried antacids Christy Gentles) for the symptoms. The treatment provided no relief. There is no history of abdominal surgery, colon cancer, gallstones, GERD or PUD.    Past Medical History  Diagnosis Date  . POSTMENOPAUSAL STATUS   . DYSLIPIDEMIA   . HYPERTENSION   . Arthritis   . Colitis     hx colitis-gi bleed-2006  . Partial tear of subscapularis tendon 04/22/2012  . Supraspinatus tendon tear 04/22/2012    Review of Systems  Constitutional: Negative for weight loss. Fever: ? - felt flushed and chilled x 48h at beginning of illness.  Gastrointestinal: Positive for abdominal pain, diarrhea and flatus (painful). Negative for nausea, vomiting, constipation, melena, hematochezia and anorexia.  Genitourinary: Negative for dysuria, frequency and hematuria.       Objective:   Physical Exam BP 130/92  Pulse 63  Temp(Src) 97.9 F (36.6 C) (Oral)  Wt 191 lb (86.637 kg)  BMI 29.91 kg/m2  SpO2 97% Wt Readings from Last 3 Encounters:  07/27/12 191 lb (86.637 kg)  04/22/12 195 lb 2 oz (88.508 kg)  04/22/12 195 lb 2 oz (88.508 kg)   Constitutional: She is overweight, but  appears well-developed and well-nourished. Very uncomfortable with movement, especially position change.  Neck: Normal range of motion. Neck supple. No JVD present. No thyromegaly present.  Cardiovascular: Normal rate, regular rhythm and normal heart sounds.  No murmur heard. No BLE edema. Pulmonary/Chest: Effort normal and breath sounds normal. No respiratory distress. She has no wheezes.  Abdominal: Soft. Bowel sounds are diminished. She exhibits no distension, but there is RLQ tenderness no rebound or gaurding. no masses Rectal: deferred Neurological: She is alert and oriented to person, place, and time. No cranial nerve deficit. Coordination, balance, strength, speech and gait are normal.  Skin: Skin is warm and dry. No rash noted. No erythema.  Psychiatric: She has a normal mood and affect. Her behavior is normal. Judgment and thought content normal.   Lab Results  Component Value Date   WBC 6.0 08/28/2011   HGB 14.6 04/22/2012   HCT 43.0 04/22/2012   PLT 221.0 08/28/2011   GLUCOSE 119* 04/22/2012   CHOL 245* 08/28/2011   TRIG 157.0* 08/28/2011   HDL 43.90 08/28/2011   LDLDIRECT 159.1 08/28/2011   LDLCALC 116* 01/04/2009   ALT 19 08/28/2011   AST 19 08/28/2011   NA 141 04/22/2012   K 3.7 04/22/2012   CL 105 04/22/2012   CREATININE 0.90 04/22/2012   BUN 15 04/22/2012   CO2 30 08/28/2011   TSH 1.68 08/28/2011       Assessment & Plan:   Bilateral lower abdominal pain x5 days. R>L LQ pain Associated with rectal pain and pressure,  intense cramping and diarrhea.  Concerning for colitis or appendicitis. Prior colonoscopy April 2006 reviewed during hospitalization for ischemic colitis  Check labs now Refer for CT abdomen and pelvic w/ cm today Empiric Cipro and Flagyl with Norco as needed for pain Further treatment to depend on test results and patient's symptoms   Addendum: Ct Abdomen Pelvis W Contrast  07/27/2012   *RADIOLOGY REPORT*  Clinical Data: Right-sided abdominal pain, pelvic pain  and rectal pain.  CT ABDOMEN AND PELVIS WITH CONTRAST  Technique:  Multidetector CT imaging of the abdomen and pelvis was performed following the standard protocol during bolus administration of intravenous contrast.  Contrast: OMNIPAQUE IOHEXOL 300 MG/ML  SOLN  Comparison: None.  Findings: There is diffuse thickening and inflammation involving a segment of the distal sigmoid colon in the posterior pelvis and. Wall thickening approaches 15 mm in estimated caliber and segment of thickened colon is roughly 6 cm in length.  Surrounding inflammatory changes are present as well as multiple visualized small adjacent lymph nodes. There are multiple diverticula throughout the sigmoid colon.  Based on appearance, this most likely is reflective of acute diverticulitis.  However, there would be some concern for potential underlying mass lesion given CT appearance and ultimate correlation with colonoscopy would be helpful.  There is no evidence of free intraperitoneal air or focal abscess. No associated bowel obstruction.  The liver, pancreas, spleen, adrenal glands and kidneys are within normal limits.  There is a single large laminated and calcified gallstone within the gallbladder measuring up to 2.8 cm in maximal diameter.  There is no associated gallbladder inflammation or biliary ductal dilatation.  The mild degenerative changes are present in the lower lumbar spine.  The bladder is unremarkable.  No hernias are seen.  IMPRESSION:  1.  Diffuse thickening and inflammation involving a segment of the distal sigmoid colon in a segment of diverticulosis.  This most likely relates to acute diverticulitis.  Underlying mass lesion is not excluded by CT. 2.  Cholelithiasis with a dominant 2.8 cm calcified gallstone identified by CT.  No signs of acute cholecystitis or biliary obstruction.   Original Report Authenticated By: Irish Lack, M.D.   Results relayed to patient. Continue treatment for acute diverticulitis as  presumed cause of pain - cipro/flagyl x 7d Plan follow up colo after resolution of acute symptoms  Large solitary gallstone noted - doubt cause of current BLQ pain symptoms but will refer to gen surg for elective chol consideration after acute issues resolve

## 2012-07-28 ENCOUNTER — Other Ambulatory Visit: Payer: Managed Care, Other (non HMO)

## 2012-09-07 ENCOUNTER — Encounter: Payer: Managed Care, Other (non HMO) | Admitting: Internal Medicine

## 2012-09-13 ENCOUNTER — Encounter: Payer: Managed Care, Other (non HMO) | Admitting: Internal Medicine

## 2012-09-20 ENCOUNTER — Ambulatory Visit (INDEPENDENT_AMBULATORY_CARE_PROVIDER_SITE_OTHER): Payer: Managed Care, Other (non HMO) | Admitting: Internal Medicine

## 2012-09-20 ENCOUNTER — Encounter: Payer: Self-pay | Admitting: Internal Medicine

## 2012-09-20 ENCOUNTER — Other Ambulatory Visit (INDEPENDENT_AMBULATORY_CARE_PROVIDER_SITE_OTHER): Payer: Managed Care, Other (non HMO)

## 2012-09-20 VITALS — BP 172/98 | HR 57 | Temp 97.7°F | Wt 188.0 lb

## 2012-09-20 DIAGNOSIS — I10 Essential (primary) hypertension: Secondary | ICD-10-CM

## 2012-09-20 DIAGNOSIS — IMO0002 Reserved for concepts with insufficient information to code with codable children: Secondary | ICD-10-CM

## 2012-09-20 DIAGNOSIS — R079 Chest pain, unspecified: Secondary | ICD-10-CM

## 2012-09-20 DIAGNOSIS — E785 Hyperlipidemia, unspecified: Secondary | ICD-10-CM

## 2012-09-20 LAB — LIPID PANEL
Cholesterol: 237 mg/dL — ABNORMAL HIGH (ref 0–200)
HDL: 48.3 mg/dL (ref >39.00)
Total CHOL/HDL Ratio: 5
VLDL: 20.4 mg/dL (ref 0.0–40.0)

## 2012-09-20 LAB — CBC WITH DIFFERENTIAL/PLATELET
Basophils Absolute: 0 10*3/uL (ref 0.0–0.1)
Basophils Relative: 0.3 % (ref 0.0–3.0)
Eosinophils Relative: 1.4 % (ref 0.0–5.0)
HCT: 44.6 % (ref 36.0–46.0)
Hemoglobin: 15.5 g/dL — ABNORMAL HIGH (ref 12.0–15.0)
MCHC: 34.7 g/dL (ref 30.0–36.0)
MCV: 93.3 fl (ref 78.0–100.0)
Neutro Abs: 6.4 10*3/uL (ref 1.4–7.7)
RBC: 4.78 Mil/uL (ref 3.87–5.11)
WBC: 9.7 10*3/uL (ref 4.5–10.5)

## 2012-09-20 LAB — BASIC METABOLIC PANEL
CO2: 30 mEq/L (ref 19–32)
Chloride: 102 mEq/L (ref 96–112)
Creatinine, Ser: 0.7 mg/dL (ref 0.4–1.2)
Potassium: 3.5 mEq/L (ref 3.5–5.1)

## 2012-09-20 LAB — LDL CHOLESTEROL, DIRECT: Direct LDL: 177.1 mg/dL

## 2012-09-20 LAB — HEPATIC FUNCTION PANEL
Albumin: 4.3 g/dL (ref 3.5–5.2)
Alkaline Phosphatase: 70 U/L (ref 39–117)
Total Protein: 8.2 g/dL (ref 6.0–8.3)

## 2012-09-20 LAB — TSH: TSH: 1.61 u[IU]/mL (ref 0.35–5.50)

## 2012-09-20 MED ORDER — ASPIRIN EC 325 MG PO TBEC: 325.0000 mg | DELAYED_RELEASE_TABLET | Freq: Every day | ORAL | Status: DC

## 2012-09-20 MED ORDER — AMLODIPINE BESYLATE 2.5 MG PO TABS: 2.5000 mg | ORAL_TABLET | Freq: Every day | ORAL | Status: DC

## 2012-09-20 NOTE — Assessment & Plan Note (Signed)
Recent shoulder surgery on right - exacerbating pain with overuse/PT rule out cardiac issues as above Hold PT until symptoms improved

## 2012-09-20 NOTE — Assessment & Plan Note (Signed)
Pt declines statin due to myalgias and leg cramps on prior trials of Lipitor and Crestor  The patient is asked to make an attempt to improve diet and exercise patterns to aid in medical management of this problem.

## 2012-09-20 NOTE — Patient Instructions (Signed)
It was good to see you today. We have reviewed your prior records including labs and tests today Test(s) ordered today. Your results will be released to MyChart (or called to you) after review, usually within 72hours after test completion. If any changes need to be made, you will be notified at that same time. we'll make referral for stress test to exclude cardiac problem causing pain symptoms . Our office will contact you regarding appointment(s) once made. Medications reviewed and updated, add Aspirin 325 mg daily and begin low dose amlodipine daily for blood pressure -no other changes recommended at this time. continue taking 2 Benicar HCT each day Your prescription(s) have been submitted to your pharmacy. Please take as directed and contact our office if you believe you are having problem(s) with the medication(s). Please schedule followup in 2 weeks to recheck blood pressure and review symptoms, call sooner if problems. if your symptoms continue to worsen (pain, shortness of breath etc), or if you are unable take anything by mouth (pills, fluids, etc), you should go to the emergency room for further evaluation and treatment.

## 2012-09-20 NOTE — Progress Notes (Signed)
Subjective:    Patient ID: Bailey [name], female    DOB: 1951/01/28, 62 y.o.   MRN: [id number]  Hypertension This is a chronic problem. The problem has been rapidly worsening since onset. The problem is uncontrolled. Associated symptoms include chest pain (yesterday), malaise/fatigue and palpitations. Pertinent negatives include no anxiety, headaches, neck pain, peripheral edema, shortness of breath or sweats. There are no associated agents to hypertension. Risk factors for coronary artery disease include dyslipidemia and post-menopausal state. Past treatments include angiotensin blockers and diuretics. The current treatment provides mild improvement. There are no compliance problems.  There is no history of kidney disease, CVA or heart failure.  Chest Pain  This is a new problem. The current episode started yesterday. The onset quality is gradual. The problem has been resolved. The pain is moderate. The quality of the pain is described as pressure. The pain radiates to the left shoulder and left arm. Associated symptoms include dizziness, malaise/fatigue and palpitations. Pertinent negatives include no abdominal pain, headaches or shortness of breath.  Her past medical history is significant for hypertension.    Past Medical History  Diagnosis Date  . POSTMENOPAUSAL STATUS   . DYSLIPIDEMIA   . HYPERTENSION   . Arthritis   . Colitis     hx colitis-gi bleed-2006  . Partial tear of subscapularis tendon 04/22/2012  . Supraspinatus tendon tear 04/22/2012    Review of Systems  Constitutional: Positive for malaise/fatigue.  HENT: Negative for neck pain.   Respiratory: Negative for shortness of breath.   Cardiovascular: Positive for chest pain (yesterday) and palpitations.  Gastrointestinal: Negative for abdominal pain.  Neurological: Positive for dizziness. Negative for headaches.       Objective:   Physical Exam BP 172/98  Pulse 57  Temp(Src) 97.7 F (36.5 C) (Oral)  Wt 188 lb  (85.276 kg)  BMI 29.44 kg/m2  SpO2 97% Wt Readings from Last 3 Encounters:  09/20/12 188 lb (85.276 kg)  07/27/12 191 lb (86.637 kg)  04/22/12 195 lb 2 oz (88.508 kg)   Constitutional: She appears well-developed and well-nourished. No distress.  Neck: Normal range of motion. Neck supple. No JVD present. No thyromegaly present.  Cardiovascular: Normal rate, regular rhythm and normal heart sounds.  No murmur heard. No BLE edema. Pulmonary/Chest: Effort normal and breath sounds normal. No respiratory distress. She has no wheezes.  Musculoskeletal: tender costal margin to palpation - L RTC intact and FROM, no pain -Normal range of motion, no joint effusions. No gross deformities Skin: Skin is warm and dry. No rash noted. No erythema.  Psychiatric: She has a normal mood and affect. Her behavior is normal. Judgment and thought content normal.   Lab Results  Component Value Date   WBC 11.7* 07/27/2012   HGB 13.8 07/27/2012   HCT 39.9 07/27/2012   PLT 257.0 07/27/2012   GLUCOSE 88 07/27/2012   CHOL 245* 08/28/2011   TRIG 157.0* 08/28/2011   HDL 43.90 08/28/2011   LDLDIRECT 159.1 08/28/2011   LDLCALC 116* 01/04/2009   ALT 31 07/27/2012   AST 23 07/27/2012   NA 139 07/27/2012   K 4.1 07/27/2012   CL 102 07/27/2012   CREATININE 0.7 07/27/2012   BUN 13 07/27/2012   CO2 29 07/27/2012   TSH 1.68 08/28/2011   ECG: sinus brady @ 48, nonsp T wave changes- no change from 08/2011 in EMR     Assessment & Plan:   chest pain - typical and atypical features for angina with numerous CRF - No  acute change on ECG Start ASA 325 qd and tx hypertension  Check labs and refer for ETT To ER/hosp if recurrent symptoms prior to OP workup complete

## 2012-09-20 NOTE — Assessment & Plan Note (Addendum)
The current medical regimen is not effective at present Will not start beta-blocker due to bradycardia On Max ARB+HCT Add amlodipine low dose and refer for stress test as above Recheck in 2 weeks, sooner if problems  BP Readings from Last 3 Encounters:  09/20/12 172/98  07/27/12 130/92  04/22/12 142/83

## 2012-09-27 ENCOUNTER — Ambulatory Visit (INDEPENDENT_AMBULATORY_CARE_PROVIDER_SITE_OTHER): Payer: Managed Care, Other (non HMO) | Admitting: Internal Medicine

## 2012-09-27 ENCOUNTER — Encounter: Payer: Self-pay | Admitting: Internal Medicine

## 2012-09-27 VITALS — BP 148/94 | HR 58 | Temp 97.8°F | Wt 186.4 lb

## 2012-09-27 DIAGNOSIS — R079 Chest pain, unspecified: Secondary | ICD-10-CM

## 2012-09-27 DIAGNOSIS — I10 Essential (primary) hypertension: Secondary | ICD-10-CM

## 2012-09-27 NOTE — Patient Instructions (Signed)
It was good to see you today. We have reviewed your prior records including labs and tests today Keep scheduled appointment for stress test to exclude cardiac problem causing pain symptoms .  Medications reviewed and updated, no changes at this time Please reschedule for physical after 1st of the year (2015) to recheck blood pressure, labs and review symptoms, call sooner if problems. Ok to cancel next appointment but call if symptoms worse or unimproved

## 2012-09-27 NOTE — Progress Notes (Signed)
Subjective:    Patient ID: Bailey [name], female    DOB: August 07, 1950, 63 y.o.   MRN: [id number]  HPI Patient presents today for follow up of left-sided shoulder and chest pain that she experienced 1 week ago. Patient states that the pain she was experiencing last week has completely resolved after she began taking tylenol last week for the symptoms. She states that she now recalls that a couple days prior to the onset of the pain that she overused the left shoulder when doing housework.  The patient is also following up for elevations in her blood pressure. Her BP was measure in the office last week at 172/94. She claims that her BP has been very well controlled over the past week, and her systolic reading has consistently been under 120. It was measured today at 140/94. She has been taking Benicar-HCTZ bid and was started on amlodipine last week. She thinks she has not been tolerating the amlodipine well. She has had a few episodes of palpitations, with one episode 4 days ago that awoke her from sleep.   Past Medical History  Diagnosis Date  . POSTMENOPAUSAL STATUS   . DYSLIPIDEMIA   . HYPERTENSION   . Arthritis   . Colitis     hx colitis-gi bleed-2006  . Partial tear of subscapularis tendon 04/22/2012  . Supraspinatus tendon tear 04/22/2012   Family History  Problem Relation Age of Onset  . Hypertension Father    History  Substance Use Topics  . Smoking status: Never Smoker   . Smokeless tobacco: Not on file     Comment: exposed to second hand (spouse smokes), Married  . Alcohol Use: No    Review of Systems  Constitutional: Positive for fatigue. Negative for fever and unexpected weight change.  Eyes: Negative for visual disturbance.  Respiratory: Negative for cough, chest tightness and shortness of breath.   Cardiovascular: Positive for palpitations (occ). Negative for leg swelling.  Skin: Negative for rash.  Neurological: Negative for dizziness, weakness and headaches.        Objective:   Physical Exam  Constitutional: She is oriented to person, place, and time. She appears well-developed and well-nourished.  HENT:  Head: Normocephalic and atraumatic.  Eyes: EOM are normal. Pupils are equal, round, and reactive to light.  Neck: Normal range of motion. Neck supple.  Cardiovascular: Normal rate and regular rhythm.  Exam reveals no gallop and no friction rub.   No murmur heard. Pulmonary/Chest: Breath sounds normal. She has no wheezes. She has no rales.  Musculoskeletal: She exhibits no edema.  Neurological: She is alert and oriented to person, place, and time.  Skin: Skin is warm and dry.  Psychiatric:  Anxious   Lab Results  Component Value Date   WBC 9.7 09/20/2012   HGB 15.5* 09/20/2012   HCT 44.6 09/20/2012   PLT 259.0 09/20/2012   GLUCOSE 99 09/20/2012   CHOL 237* 09/20/2012   TRIG 102.0 09/20/2012   HDL 48.30 09/20/2012   LDLDIRECT 177.1 09/20/2012   LDLCALC 116* 01/04/2009   ALT 21 09/20/2012   AST 19 09/20/2012   NA 138 09/20/2012   K 3.5 09/20/2012   CL 102 09/20/2012   CREATININE 0.7 09/20/2012   BUN 14 09/20/2012   CO2 30 09/20/2012   TSH 1.61 09/20/2012         Assessment & Plan:  1. Chest pain, musculoskeletal- resolved. Patient will still follow up with cardiology for a stress test given her personal history of hypertension  and hyperlipidemia and her family history of heart disease. Patient will continue daily aspirin until stress test results. She is cleared to continue PT for her right shoulder as recommended by her surgeon.  2. Hypertension- Currently well-controlled per patient report of home blood pressure readings. Patient was reassured that the amlodipine should not be causing palpitations, and this is most likely due to her anxiety from the chest pain episode. Patient will continue monitor and call us if persisting symptoms or other problems, no changes recommended today  Concha Se, [place] [facility]  I have personally reviewed this  case with PA student. I also personally examined this patient. I agree with history and findings as documented above. I reviewed, discussed and approve of the assessment and plan as listed above. Rene Paci, MD Time spent with pt today 25 minutes, greater than 50% time spent counseling patient on causes of chest pain, anxiety, hypertension, lipids and medication review. Also review of prior records

## 2012-09-27 NOTE — Assessment & Plan Note (Signed)
The current medical regimen is not effective at present Will not start beta-blocker due to bradycardia On Max ARB+HCT Added amlodipine low dose 08/2012 - improved follow up for for stress test as above   BP Readings from Last 3 Encounters:  09/27/12 148/94  09/20/12 172/98  07/27/12 130/92

## 2012-10-04 ENCOUNTER — Ambulatory Visit: Payer: Managed Care, Other (non HMO) | Admitting: Internal Medicine

## 2012-10-06 ENCOUNTER — Ambulatory Visit (INDEPENDENT_AMBULATORY_CARE_PROVIDER_SITE_OTHER): Payer: Managed Care, Other (non HMO) | Admitting: Physician Assistant

## 2012-10-06 DIAGNOSIS — R079 Chest pain, unspecified: Secondary | ICD-10-CM

## 2012-10-06 DIAGNOSIS — R9439 Abnormal result of other cardiovascular function study: Secondary | ICD-10-CM

## 2012-10-06 DIAGNOSIS — I10 Essential (primary) hypertension: Secondary | ICD-10-CM

## 2012-10-06 DIAGNOSIS — E785 Hyperlipidemia, unspecified: Secondary | ICD-10-CM

## 2012-10-06 NOTE — Progress Notes (Signed)
Exercise Treadmill Test  Pre-Exercise Testing Evaluation Rhythm: sinus bradycardia  Rate: 52     Test  Exercise Tolerance Test Ordering MD: Melene Muller, MD  Interpreting MD: Tereso Newcomer, PA-C  Unique Test No: 1  Treadmill:  1  Indication for ETT: chest pain - rule out ischemia  Contraindication to ETT: No   Stress Modality: exercise - treadmill  Cardiac Imaging Performed: non   Protocol: standard Bruce - maximal  Max BP:  228/95  Max MPHR (bpm):  158 85% MPR (bpm):  134  MPHR obtained (bpm):  146 % MPHR obtained:  92  Reached 85% MPHR (min:sec):  6:21 Total Exercise Time (min-sec):  6:30  Workload in METS:  7.7 Borg Scale: 17  Reason ETT Terminated:  fatigue    ST Segment Analysis At Rest: non-specific ST segment slurring With Exercise: borderline ST changes  Other Information Arrhythmia:  Yes Angina during ETT:  absent (0) Quality of ETT:  indeterminate  ETT Interpretation:  borderline (indeterminate) with non-specific ST changes  Comments: Fair exercise tolerance. No chest pain. Hypertensive BP response to exercise. There was borderline ST depression in the inferior leads at max stress. Occasional PVCs noted. One ventricular couplet during exercise. There was a brief run of ATach prior to starting exercise.  Few brief episodes noted in recovery.  She was asymptomatic.   Recommendations: Given ST changes will schedule a Lexiscan Myoview to rule out ischemia. F/u with PCP as planned for continued BP management. Consider event monitor or Holter monitor with episodes of ATach - defer decision on ordering to PCP.  Signed,  Tereso Newcomer, PA-C   10/06/2012 10:12 AM

## 2012-10-12 ENCOUNTER — Telehealth: Payer: Self-pay | Admitting: *Deleted

## 2012-10-12 NOTE — Telephone Encounter (Signed)
Pt wants to know why she needs the nuclear test.  Please advise

## 2012-10-12 NOTE — Telephone Encounter (Signed)
Because the did note some mild ST depression during her stress test and they need to completely rule out ischemia.

## 2012-10-12 NOTE — Telephone Encounter (Signed)
Spoke with pt. Advised of message

## 2012-10-12 NOTE — Telephone Encounter (Signed)
Please call pt and let her know, that they did see some hypertensive response with the exercise. They did not see any coronary spasm but they to want to follow up with a myocardial perfusion test. This has been ordered by scott weaver. We will call you when it is set up.

## 2012-10-12 NOTE — Telephone Encounter (Signed)
Pt called requesting Stress Test results from 8.7.14.  Please advise in Dr Diamantina Monks absence.

## 2012-11-01 ENCOUNTER — Encounter: Payer: Managed Care, Other (non HMO) | Admitting: Internal Medicine

## 2012-11-03 ENCOUNTER — Ambulatory Visit (HOSPITAL_COMMUNITY): Payer: Managed Care, Other (non HMO) | Attending: Physician Assistant | Admitting: Radiology

## 2012-11-03 VITALS — BP 166/90 | Ht 66.0 in | Wt 192.0 lb

## 2012-11-03 DIAGNOSIS — R9439 Abnormal result of other cardiovascular function study: Secondary | ICD-10-CM

## 2012-11-03 DIAGNOSIS — R079 Chest pain, unspecified: Secondary | ICD-10-CM | POA: Insufficient documentation

## 2012-11-03 DIAGNOSIS — R002 Palpitations: Secondary | ICD-10-CM | POA: Insufficient documentation

## 2012-11-03 DIAGNOSIS — I10 Essential (primary) hypertension: Secondary | ICD-10-CM | POA: Insufficient documentation

## 2012-11-03 DIAGNOSIS — E785 Hyperlipidemia, unspecified: Secondary | ICD-10-CM | POA: Insufficient documentation

## 2012-11-03 MED ORDER — TECHNETIUM TC 99M SESTAMIBI GENERIC - CARDIOLITE
33.0000 | Freq: Once | INTRAVENOUS | Status: AC | PRN
  Administered 2012-11-03: 33 via INTRAVENOUS

## 2012-11-03 MED ORDER — TECHNETIUM TC 99M SESTAMIBI GENERIC - CARDIOLITE
11.0000 | Freq: Once | INTRAVENOUS | Status: AC | PRN
  Administered 2012-11-03: 11 via INTRAVENOUS

## 2012-11-03 MED ORDER — REGADENOSON 0.4 MG/5ML IV SOLN
0.4000 mg | Freq: Once | INTRAVENOUS | Status: AC
  Administered 2012-11-03: 0.4 mg via INTRAVENOUS

## 2012-11-03 NOTE — Progress Notes (Signed)
[facility] SITE 3 NUCLEAR MED [address] [place], [place] [address] [phone]    Cardiology Nuclear Med Study  Bailey [name] is a 62 y.o. female     MRN : [id number]     DOB: March 24, 1950  Procedure Date: 11/03/2012  Nuclear Med Background Indication for Stress Test:  Evaluation for Ischemia History:  8/14 Abnormal GXT Cardiac Risk Factors: Hypertension and Lipids  Symptoms:  Chest Pain and Palpitations   Nuclear Pre-Procedure Caffeine/Decaff Intake:  None NPO After: 7:00pm   Lungs:  clear O2 Sat: 99% on room air. IV 0.9% NS with Angio Cath:  22g  IV Site: R Hand  IV Started by:  Bonnita Levan, RN  Chest Size (in):  40 Cup Size: C  Height: 5\' 6"  (1.676 m)  Weight:  192 lb (87.091 kg)  BMI:  Body mass index is 31 kg/(m^2). Tech Comments:  N/A    Nuclear Med Study 1 or 2 day study: 1 day  Stress Test Type:  Lexiscan  Reading MD: Marca Ancona, MD  Order Authorizing Provider:  Verne Carrow, MD  Resting Radionuclide: Technetium 49m Sestamibi  Resting Radionuclide Dose: 11.0 mCi   Stress Radionuclide:  Technetium 69m Sestamibi  Stress Radionuclide Dose: 33.0 mCi           Stress Protocol Rest HR: 46 Stress HR: 88  Rest BP: 166/90 Stress BP: 203/96  Exercise Time (min): 2:00 METS: 1.6   Predicted Max HR: 158 bpm % Max HR: 55.7 bpm Rate Pressure Product: 21308   Dose of Adenosine (mg):  n/a Dose of Lexiscan: 0.4 mg  Dose of Atropine (mg): n/a Dose of Dobutamine: n/a mcg/kg/min (at max HR)  Stress Test Technologist: Bonnita Levan, RN  Nuclear Technologist:  Domenic Polite, CNMT     Rest Procedure:  Myocardial perfusion imaging was performed at rest 45 minutes following the intravenous administration of Technetium 66m Sestamibi. Rest ECG: NSR - Normal EKG  Stress Procedure:  The patient received IV Lexiscan 0.4 mg over 15-seconds with concurrent low level exercise and then Technetium 62m Sestamibi was injected at 30-seconds while the patient  continued walking one more minute.  Quantitative spect images were obtained after a 45-minute delay. Stress ECG: No significant change from baseline ECG  QPS Raw Data Images:  Normal; no motion artifact; normal heart/lung ratio. Stress Images:  Small, mild apical anterior perfusion defect. Rest Images:  Small, mild apical anterior perfusion defect. Subtraction (SDS):  Small, mild apical anterior perfusion defect actually looks worse at rest.  Transient Ischemic Dilatation (Normal <1.22):  n/a Lung/Heart Ratio (Normal <0.45):  0.53  Quantitative Gated Spect Images QGS EDV:  121 ml QGS ESV:  48 ml  Impression Exercise Capacity:  Lexiscan with low level exercise. BP Response:  Hypertensive blood pressure response. Clinical Symptoms:  "Tired" ECG Impression:  No significant ST segment change suggestive of ischemia. Comparison with Prior Nuclear Study: No images to compare  Overall Impression:  Low risk stress nuclear study with a small, mild fixed (actually worse at rest) apical anterior perfusion defect.  Given normal wall motion, I suspect that this represents soft tissue attenuation. .  LV Ejection Fraction: 61%.  LV Wall Motion:  NL LV Function; NL Wall Motion  Marca Ancona 11/03/2012

## 2012-11-04 ENCOUNTER — Encounter: Payer: Self-pay | Admitting: Physician Assistant

## 2012-11-04 ENCOUNTER — Telehealth: Payer: Self-pay | Admitting: *Deleted

## 2012-11-04 ENCOUNTER — Encounter: Payer: Managed Care, Other (non HMO) | Admitting: Internal Medicine

## 2012-11-04 NOTE — Telephone Encounter (Signed)
Follow up    Returning call back to Coy Saunas. For test results

## 2012-11-04 NOTE — Telephone Encounter (Signed)
lmptcb for myoview results 

## 2012-11-07 NOTE — Telephone Encounter (Signed)
Spoke with patient about myoview results. 

## 2012-11-07 NOTE — Telephone Encounter (Addendum)
Pt did not receive call , pls call today with results at [phone]

## 2012-11-09 ENCOUNTER — Telehealth: Payer: Self-pay | Admitting: *Deleted

## 2012-11-09 NOTE — Telephone Encounter (Signed)
Spoke with pt advised of MDs message 

## 2012-11-09 NOTE — Telephone Encounter (Signed)
Pt called requesting she is to continue taking Amlodipine and Aspirin based on cardiac stress test results.  Pt states she is fine taking the Benicar.  Please advise

## 2012-11-09 NOTE — Telephone Encounter (Signed)
I recommend taking amlodipine, aspirin and Benicar as prescribed

## 2012-12-15 ENCOUNTER — Other Ambulatory Visit: Payer: Self-pay | Admitting: *Deleted

## 2012-12-15 MED ORDER — AMLODIPINE BESYLATE 2.5 MG PO TABS: 2.5000 mg | ORAL_TABLET | Freq: Every day | ORAL | Status: DC

## 2013-02-24 ENCOUNTER — Other Ambulatory Visit: Payer: Self-pay | Admitting: Internal Medicine

## 2013-03-07 ENCOUNTER — Encounter: Payer: Managed Care, Other (non HMO) | Admitting: Internal Medicine

## 2013-04-07 ENCOUNTER — Encounter: Payer: Managed Care, Other (non HMO) | Admitting: Internal Medicine

## 2013-05-12 ENCOUNTER — Encounter: Payer: Managed Care, Other (non HMO) | Admitting: Internal Medicine

## 2013-06-22 ENCOUNTER — Encounter: Payer: Self-pay | Admitting: Internal Medicine

## 2013-06-22 ENCOUNTER — Other Ambulatory Visit (INDEPENDENT_AMBULATORY_CARE_PROVIDER_SITE_OTHER): Payer: Managed Care, Other (non HMO)

## 2013-06-22 ENCOUNTER — Ambulatory Visit (INDEPENDENT_AMBULATORY_CARE_PROVIDER_SITE_OTHER): Payer: Managed Care, Other (non HMO) | Admitting: Internal Medicine

## 2013-06-22 VITALS — BP 162/90 | HR 57 | Temp 97.2°F | Wt 209.0 lb

## 2013-06-22 DIAGNOSIS — E785 Hyperlipidemia, unspecified: Secondary | ICD-10-CM

## 2013-06-22 DIAGNOSIS — Z0000 Encounter for general adult medical examination without abnormal findings: Secondary | ICD-10-CM

## 2013-06-22 DIAGNOSIS — I10 Essential (primary) hypertension: Secondary | ICD-10-CM

## 2013-06-22 DIAGNOSIS — E669 Obesity, unspecified: Secondary | ICD-10-CM

## 2013-06-22 DIAGNOSIS — Z1239 Encounter for other screening for malignant neoplasm of breast: Secondary | ICD-10-CM

## 2013-06-22 LAB — HEPATIC FUNCTION PANEL
ALBUMIN: 4.2 g/dL (ref 3.5–5.2)
ALK PHOS: 72 U/L (ref 39–117)
ALT: 24 U/L (ref 0–35)
AST: 21 U/L (ref 0–37)
BILIRUBIN TOTAL: 0.8 mg/dL (ref 0.3–1.2)
Bilirubin, Direct: 0.1 mg/dL (ref 0.0–0.3)
Total Protein: 8 g/dL (ref 6.0–8.3)

## 2013-06-22 LAB — CBC WITH DIFFERENTIAL/PLATELET
BASOS ABS: 0 10*3/uL (ref 0.0–0.1)
Basophils Relative: 0.2 % (ref 0.0–3.0)
Eosinophils Absolute: 0.1 10*3/uL (ref 0.0–0.7)
Eosinophils Relative: 1.7 % (ref 0.0–5.0)
HCT: 43.7 % (ref 36.0–46.0)
HEMOGLOBIN: 14.9 g/dL (ref 12.0–15.0)
LYMPHS PCT: 22.6 % (ref 12.0–46.0)
Lymphs Abs: 1.8 10*3/uL (ref 0.7–4.0)
MCHC: 34.2 g/dL (ref 30.0–36.0)
MCV: 93.3 fl (ref 78.0–100.0)
Monocytes Absolute: 0.4 10*3/uL (ref 0.1–1.0)
Monocytes Relative: 5.5 % (ref 3.0–12.0)
NEUTROS ABS: 5.6 10*3/uL (ref 1.4–7.7)
Neutrophils Relative %: 70 % (ref 43.0–77.0)
Platelets: 257 10*3/uL (ref 150.0–400.0)
RBC: 4.68 Mil/uL (ref 3.87–5.11)
RDW: 13.4 % (ref 11.5–14.6)
WBC: 8 10*3/uL (ref 4.5–10.5)

## 2013-06-22 LAB — URINALYSIS, ROUTINE W REFLEX MICROSCOPIC
BILIRUBIN URINE: NEGATIVE
Ketones, ur: NEGATIVE
LEUKOCYTES UA: NEGATIVE
Nitrite: NEGATIVE
Total Protein, Urine: NEGATIVE
UROBILINOGEN UA: 0.2 (ref 0.0–1.0)
Urine Glucose: NEGATIVE
WBC, UA: NONE SEEN (ref 0–?)
pH: 5.5 (ref 5.0–8.0)

## 2013-06-22 LAB — BASIC METABOLIC PANEL
BUN: 13 mg/dL (ref 6–23)
CALCIUM: 10.2 mg/dL (ref 8.4–10.5)
CHLORIDE: 102 meq/L (ref 96–112)
CO2: 29 meq/L (ref 19–32)
CREATININE: 0.7 mg/dL (ref 0.4–1.2)
GFR: 96.18 mL/min (ref >60.00)
Glucose, Bld: 120 mg/dL — ABNORMAL HIGH (ref 70–99)
Potassium: 3.6 mEq/L (ref 3.5–5.1)
Sodium: 138 mEq/L (ref 135–145)

## 2013-06-22 LAB — LIPID PANEL
Cholesterol: 248 mg/dL — ABNORMAL HIGH (ref 0–200)
HDL: 46.3 mg/dL (ref >39.00)
LDL CALC: 182 mg/dL — AB (ref 0–99)
Total CHOL/HDL Ratio: 5
Triglycerides: 101 mg/dL (ref 0.0–149.0)
VLDL: 20.2 mg/dL (ref 0.0–40.0)

## 2013-06-22 LAB — TSH: TSH: 2.97 u[IU]/mL (ref 0.35–5.50)

## 2013-06-22 MED ORDER — AMLODIPINE BESYLATE 2.5 MG PO TABS: 2.5000 mg | ORAL_TABLET | Freq: Every day | ORAL | Status: DC

## 2013-06-22 MED ORDER — OLMESARTAN MEDOXOMIL-HCTZ 40-25 MG PO TABS: 1.0000 | ORAL_TABLET | Freq: Every day | ORAL | Status: DC

## 2013-06-22 NOTE — Patient Instructions (Addendum)
It was good to see you today.  We have reviewed your prior records including labs and tests today  Health Maintenance reviewed - all recommended immunizations and age-appropriate screenings are up-to-date.  Check with her insurance regarding coverage for shingles vaccine and call us when you want to proceed with this immunization  we'll make referral for mammogram as discussed. Our office will contact you regarding appointment(s) once made.  Test(s) ordered today. Your results will be released to MyChart (or called to you) after review, usually within 72hours after test completion. If any changes need to be made, you will be notified at that same time.  Medications reviewed and updated Increase Benicar to 40/25 once daily for blood pressure control No other medication changes recommended at this time Your prescription(s)/refills have been submitted to your mail order pharmacy. Please take as directed and contact our office if you believe you are having problem(s) with the medication(s).  Followup for nurse visit in 4-6 weeks to recheck blood pressure and weight check  Work on lifestyle changes as discussed (low fat, low carb, increased protein diet; improved exercise efforts; weight loss) to control sugar, blood pressure and cholesterol levels and/or reduce risk of developing other medical problems. Look into [email or web address] or other type of food journal to assist you in this process.  Please schedule followup with me in 6 months for semiannual exam and labs, call sooner if problems.  Health Maintenance, Female A healthy lifestyle and preventative care can promote health and wellness.  Maintain regular health, dental, and eye exams.  Eat a healthy diet. Foods like vegetables, fruits, whole grains, low-fat dairy products, and lean protein foods contain the nutrients you need without too many calories. Decrease your intake of foods high in solid fats, added sugars, and salt. Get  information about a proper diet from your caregiver, if necessary.  Regular physical exercise is one of the most important things you can do for your health. Most adults should get at least 150 minutes of moderate-intensity exercise (any activity that increases your heart rate and causes you to sweat) each week. In addition, most adults need muscle-strengthening exercises on 2 or more days a week.   Maintain a healthy weight. The body mass index (BMI) is a screening tool to identify possible weight problems. It provides an estimate of body fat based on height and weight. Your caregiver can help determine your BMI, and can help you achieve or maintain a healthy weight. For adults 20 years and older:  A BMI below 18.5 is considered underweight.  A BMI of 18.5 to 24.9 is normal.  A BMI of 25 to 29.9 is considered overweight.  A BMI of 30 and above is considered obese.  Maintain normal blood lipids and cholesterol by exercising and minimizing your intake of saturated fat. Eat a balanced diet with plenty of fruits and vegetables. Blood tests for lipids and cholesterol should begin at age 20 and be repeated every 5 years. If your lipid or cholesterol levels are high, you are over 50, or you are a high risk for heart disease, you may need your cholesterol levels checked more frequently.Ongoing high lipid and cholesterol levels should be treated with medicines if diet and exercise are not effective.  If you smoke, find out from your caregiver how to quit. If you do not use tobacco, do not start.  Lung cancer screening is recommended for adults aged 55 80 years who are at high risk for developing lung cancer because of a   history of smoking. Yearly low-dose computed tomography (CT) is recommended for people who have at least a 30-pack-year history of smoking and are a current smoker or have quit within the past 15 years. A pack year of smoking is smoking an average of 1 pack of cigarettes a day for 1 year  (for example: 1 pack a day for 30 years or 2 packs a day for 15 years). Yearly screening should continue until the smoker has stopped smoking for at least 15 years. Yearly screening should also be stopped for people who develop a health problem that would prevent them from having lung cancer treatment.  If you are pregnant, do not drink alcohol. If you are breastfeeding, be very cautious about drinking alcohol. If you are not pregnant and choose to drink alcohol, do not exceed 1 drink per day. One drink is considered to be 12 ounces (355 mL) of beer, 5 ounces (148 mL) of wine, or 1.5 ounces (44 mL) of liquor.  Avoid use of street drugs. Do not share needles with anyone. Ask for help if you need support or instructions about stopping the use of drugs.  High blood pressure causes heart disease and increases the risk of stroke. Blood pressure should be checked at least every 1 to 2 years. Ongoing high blood pressure should be treated with medicines, if weight loss and exercise are not effective.  If you are 85 to 63 years old, ask your caregiver if you should take aspirin to prevent strokes.  Diabetes screening involves taking a blood sample to check your fasting blood sugar level. This should be done once every 3 years, after age 23, if you are within normal weight and without risk factors for diabetes. Testing should be considered at a younger age or be carried out more frequently if you are overweight and have at least 1 risk factor for diabetes.  Breast cancer screening is essential preventative care for women. You should practice "breast self-awareness." This means understanding the normal appearance and feel of your breasts and may include breast self-examination. Any changes detected, no matter how small, should be reported to a caregiver. Women in their 12s and 30s should have a clinical breast exam (CBE) by a caregiver as part of a regular health exam every 1 to 3 years. After age 67, women should  have a CBE every year. Starting at age 39, women should consider having a mammogram (breast X-ray) every year. Women who have a family history of breast cancer should talk to their caregiver about genetic screening. Women at a high risk of breast cancer should talk to their caregiver about having an MRI and a mammogram every year.  Breast cancer gene (BRCA)-related cancer risk assessment is recommended for women who have family members with BRCA-related cancers. BRCA-related cancers include breast, ovarian, tubal, and peritoneal cancers. Having family members with these cancers may be associated with an increased risk for harmful changes (mutations) in the breast cancer genes BRCA1 and BRCA2. Results of the assessment will determine the need for genetic counseling and BRCA1 and BRCA2 testing.  The Pap test is a screening test for cervical cancer. Women should have a Pap test starting at age 53. Between ages 98 and 63, Pap tests should be repeated every 2 years. Beginning at age 26, you should have a Pap test every 3 years as long as the past 3 Pap tests have been normal. If you had a hysterectomy for a problem that was not cancer or a  condition that could lead to cancer, then you no longer need Pap tests. If you are between ages 76 and 46, and you have had normal Pap tests going back 10 years, you no longer need Pap tests. If you have had past treatment for cervical cancer or a condition that could lead to cancer, you need Pap tests and screening for cancer for at least 20 years after your treatment. If Pap tests have been discontinued, risk factors (such as a new sexual partner) need to be reassessed to determine if screening should be resumed. Some women have medical problems that increase the chance of getting cervical cancer. In these cases, your caregiver may recommend more frequent screening and Pap tests.  The human papillomavirus (HPV) test is an additional test that may be used for cervical cancer  screening. The HPV test looks for the virus that can cause the cell changes on the cervix. The cells collected during the Pap test can be tested for HPV. The HPV test could be used to screen women aged 7 years and older, and should be used in women of any age who have unclear Pap test results. After the age of 35, women should have HPV testing at the same frequency as a Pap test.  Colorectal cancer can be detected and often prevented. Most routine colorectal cancer screening begins at the age of 56 and continues through age 75. However, your caregiver may recommend screening at an earlier age if you have risk factors for colon cancer. On a yearly basis, your caregiver may provide home test kits to check for hidden blood in the stool. Use of a small camera at the end of a tube, to directly examine the colon (sigmoidoscopy or colonoscopy), can detect the earliest forms of colorectal cancer. Talk to your caregiver about this at age 51, when routine screening begins. Direct examination of the colon should be repeated every 5 to 10 years through age 80, unless early forms of pre-cancerous polyps or small growths are found.  Hepatitis C blood testing is recommended for all people born from [birth year] through 1965 and any individual with known risks for hepatitis C.  Practice safe sex. Use condoms and avoid high-risk sexual practices to reduce the spread of sexually transmitted infections (STIs). Sexually active women aged 53 and younger should be checked for Chlamydia, which is a common sexually transmitted infection. Older women with new or multiple partners should also be tested for Chlamydia. Testing for other STIs is recommended if you are sexually active and at increased risk.  Osteoporosis is a disease in which the bones lose minerals and strength with aging. This can result in serious bone fractures. The risk of osteoporosis can be identified using a bone density scan. Women ages 41 and over and women at risk  for fractures or osteoporosis should discuss screening with their caregivers. Ask your caregiver whether you should be taking a calcium supplement or vitamin D to reduce the rate of osteoporosis.  Menopause can be associated with physical symptoms and risks. Hormone replacement therapy is available to decrease symptoms and risks. You should talk to your caregiver about whether hormone replacement therapy is right for you.  Use sunscreen. Apply sunscreen liberally and repeatedly throughout the day. You should seek shade when your shadow is shorter than you. Protect yourself by wearing long sleeves, pants, a wide-brimmed hat, and sunglasses year round, whenever you are outdoors.  Notify your caregiver of new moles or changes in moles, especially if there is  a change in shape or color. Also notify your caregiver if a mole is larger than the size of a pencil eraser.  Stay current with your immunizations. Document Released: 09/01/2010 Document Revised: 06/13/2012 Document Reviewed: 09/01/2010 [employer] Patient Information 2014 [employer]. Exercise to Lose Weight Exercise and a healthy diet may help you lose weight. Your doctor may suggest specific exercises. EXERCISE IDEAS AND TIPS  Choose low-cost things you enjoy doing, such as walking, bicycling, or exercising to workout videos.  Take stairs instead of the elevator.  Walk during your lunch break.  Park your car further away from work or school.  Go to a gym or an exercise class.  Start with 5 to 10 minutes of exercise each day. Build up to 30 minutes of exercise 4 to 6 days a week.  Wear shoes with good support and comfortable clothes.  Stretch before and after working out.  Work out until you breathe harder and your heart beats faster.  Drink extra water when you exercise.  Do not do so much that you hurt yourself, feel dizzy, or get very short of breath. Exercises that burn about 150 calories:  Running 1  miles in 15  minutes.  Playing volleyball for 45 to 60 minutes.  Washing and waxing a car for 45 to 60 minutes.  Playing touch football for 45 minutes.  Walking 1  miles in 35 minutes.  Pushing a stroller 1  miles in 30 minutes.  Playing basketball for 30 minutes.  Raking leaves for 30 minutes.  Bicycling 5 miles in 30 minutes.  Walking 2 miles in 30 minutes.  Dancing for 30 minutes.  Shoveling snow for 15 minutes.  Swimming laps for 20 minutes.  Walking up stairs for 15 minutes.  Bicycling 4 miles in 15 minutes.  Gardening for 30 to 45 minutes.  Jumping rope for 15 minutes.  Washing windows or floors for 45 to 60 minutes. Document Released: 03/21/2010 Document Revised: 05/11/2011 Document Reviewed: 03/21/2010 [employer] Patient Information 2014 [employer].  

## 2013-06-22 NOTE — Progress Notes (Signed)
Subjective:    Patient ID: Bailey [name], female    DOB: 12-10-1950, 63 y.o.   MRN: [id number]  HPI  patient is here today for annual physical. Patient feels well and has no complaints.  Also reviewed chronic medical issues and interval medical events  Past Medical History  Diagnosis Date  . POSTMENOPAUSAL STATUS   . DYSLIPIDEMIA   . HYPERTENSION   . Arthritis   . Colitis     hx colitis-gi bleed-2006  . Partial tear of subscapularis tendon 04/22/2012  . Supraspinatus tendon tear 04/22/2012  . Hx of cardiovascular stress test     Lexiscan Myoview 9/14:  Small, fixed apical anterior perfusion defect (worse at rest) - probable soft tissue attenuation, EF 61%, low risk study   Family History  Problem Relation Age of Onset  . Hypertension Father    History  Substance Use Topics  . Smoking status: Never Smoker   . Smokeless tobacco: Not on file     Comment: exposed to second hand (spouse smokes), Married  . Alcohol Use: No    Review of Systems  Constitutional: Negative for fatigue and unexpected weight change.  Respiratory: Negative for cough, shortness of breath and wheezing.   Cardiovascular: Negative for chest pain, palpitations and leg swelling.  Gastrointestinal: Negative for nausea, abdominal pain and diarrhea.  Neurological: Negative for dizziness, weakness, light-headedness and headaches.  Psychiatric/Behavioral: Negative for dysphoric mood. The patient is not nervous/anxious.   All other systems reviewed and are negative.      Objective:   Physical Exam  BP 162/90  Pulse 57  Temp(Src) 97.2 F (36.2 C) (Oral)  Wt 209 lb (94.802 kg)  SpO2 97% Wt Readings from Last 3 Encounters:  06/22/13 209 lb (94.802 kg)  11/03/12 192 lb (87.091 kg)  09/27/12 186 lb 6.4 oz (84.55 kg)   Constitutional: She is overweight, but appears well-developed and well-nourished. No distress.  HENT: Head: Normocephalic and atraumatic. Ears: B TMs ok, no erythema or effusion; Nose:  Nose normal. Mouth/Throat: Oropharynx is clear and moist. No oropharyngeal exudate.  Eyes: Conjunctivae and EOM are normal. Pupils are equal, round, and reactive to light. No scleral icterus.  Neck: Normal range of motion. Neck supple. No JVD present. No thyromegaly present.  Cardiovascular: Normal rate, regular rhythm and normal heart sounds.  No murmur heard. No BLE edema. Pulmonary/Chest: Effort normal and breath sounds normal. No respiratory distress. She has no wheezes.  Abdominal: large midline, soft reducible ventral hernia above umbilicus. Prior mesh repair reviewed. Nontender. Remaining abdomen is soft. Bowel sounds are normal. She exhibits no distension. There is no tenderness. no masses Musculoskeletal: Normal range of motion, no joint effusions. No gross deformities Neurological: She is alert and oriented to person, place, and time. No cranial nerve deficit. Coordination, balance, strength, speech and gait are normal.  Skin: Skin is warm and dry. No rash noted. No erythema.  Psychiatric: She has a normal mood and affect. Her behavior is normal. Judgment and thought content normal.    Lab Results  Component Value Date   WBC 9.7 09/20/2012   HGB 15.5* 09/20/2012   HCT 44.6 09/20/2012   PLT 259.0 09/20/2012   GLUCOSE 99 09/20/2012   CHOL 237* 09/20/2012   TRIG 102.0 09/20/2012   HDL 48.30 09/20/2012   LDLDIRECT 177.1 09/20/2012   LDLCALC 116* 01/04/2009   ALT 21 09/20/2012   AST 19 09/20/2012   NA 138 09/20/2012   K 3.5 09/20/2012   CL 102 09/20/2012  CREATININE 0.7 09/20/2012   BUN 14 09/20/2012   CO2 30 09/20/2012   TSH 1.61 09/20/2012    Ct Abdomen Pelvis W Contrast  07/27/2012   *RADIOLOGY REPORT*  Clinical Data: Right-sided abdominal pain, pelvic pain and rectal pain.  CT ABDOMEN AND PELVIS WITH CONTRAST  Technique:  Multidetector CT imaging of the abdomen and pelvis was performed following the standard protocol during bolus administration of intravenous contrast.  Contrast: 161mL  OMNIPAQUE IOHEXOL 300 MG/ML  SOLN  Comparison: None.  Findings: There is diffuse thickening and inflammation involving a segment of the distal sigmoid colon in the posterior pelvis and. Wall thickening approaches 15 mm in estimated caliber and segment of thickened colon is roughly 6 cm in length.  Surrounding inflammatory changes are present as well as multiple visualized small adjacent lymph nodes. There are multiple diverticula throughout the sigmoid colon.  Based on appearance, this most likely is reflective of acute diverticulitis.  However, there would be some concern for potential underlying mass lesion given CT appearance and ultimate correlation with colonoscopy would be helpful.  There is no evidence of free intraperitoneal air or focal abscess. No associated bowel obstruction.  The liver, pancreas, spleen, adrenal glands and kidneys are within normal limits.  There is a single large laminated and calcified gallstone within the gallbladder measuring up to 2.8 cm in maximal diameter.  There is no associated gallbladder inflammation or biliary ductal dilatation.  The mild degenerative changes are present in the lower lumbar spine.  The bladder is unremarkable.  No hernias are seen.  IMPRESSION:  1.  Diffuse thickening and inflammation involving a segment of the distal sigmoid colon in a segment of diverticulosis.  This most likely relates to acute diverticulitis.  Underlying mass lesion is not excluded by CT. 2.  Cholelithiasis with a dominant 2.8 cm calcified gallstone identified by CT.  No signs of acute cholecystitis or biliary obstruction.   Original Report Authenticated By: Aletta Edouard, M.D.       Assessment & Plan:   CPX/v70.0 - Patient has been counseled on age-appropriate routine health concerns for screening and prevention. These are reviewed and up-to-date. Immunizations are up-to-date or declined. Labs ordered and reviewed.  Problem List Items Addressed This Visit   DYSLIPIDEMIA     Pt  declines statin due to myalgias and leg cramps on prior trials of Lipitor and Crestor  Check annually, consider treatment as needed with Zetia or WelChol The patient is asked to make an attempt to improve diet and exercise patterns to aid in medical management of this problem.     HYPERTENSION      BP Readings from Last 3 Encounters:  06/22/13 162/90  11/03/12 166/90  09/27/12 148/94   The current medical regimen is not effective at present Will not start beta-blocker due to bradycardia Increase to Max ARB+HCT Added amlodipine low dose 08/2012 - keep same dose for now 10/2012 nuc stress test negative for ischemia Followup with nurse visit in 4-6 weeks for blood pressure and weight check Reviewed importance of medication compliance and weight reduction for management of this issue    Obesity (BMI 30-39.9)      Wt Readings from Last 3 Encounters:  06/22/13 209 lb (94.802 kg)  11/03/12 192 lb (87.091 kg)  09/27/12 186 lb 6.4 oz (84.55 kg)   The patient is asked to make an attempt to improve diet and exercise patterns to aid in medical management of this problem.      Other Visit  Diagnoses   Routine general medical examination at a health care facility    -  Primary

## 2013-06-22 NOTE — Assessment & Plan Note (Signed)
Pt declines statin due to myalgias and leg cramps on prior trials of Lipitor and Crestor  Check annually, consider treatment as needed with Zetia or WelChol The patient is asked to make an attempt to improve diet and exercise patterns to aid in medical management of this problem.

## 2013-06-22 NOTE — Assessment & Plan Note (Signed)
BP Readings from Last 3 Encounters:  06/22/13 162/90  11/03/12 166/90  09/27/12 148/94   The current medical regimen is not effective at present Will not start beta-blocker due to bradycardia Increase to Max ARB+HCT Added amlodipine low dose 08/2012 - keep same dose for now 10/2012 nuc stress test negative for ischemia Followup with nurse visit in 4-6 weeks for blood pressure and weight check Reviewed importance of medication compliance and weight reduction for management of this issue

## 2013-06-22 NOTE — Assessment & Plan Note (Signed)
Wt Readings from Last 3 Encounters:  06/22/13 209 lb (94.802 kg)  11/03/12 192 lb (87.091 kg)  09/27/12 186 lb 6.4 oz (84.55 kg)   The patient is asked to make an attempt to improve diet and exercise patterns to aid in medical management of this problem.

## 2013-06-22 NOTE — Progress Notes (Signed)
Pre visit review using our clinic review tool, if applicable. No additional management support is needed unless otherwise documented below in the visit note. 

## 2013-06-23 MED ORDER — EZETIMIBE 10 MG PO TABS: 10.0000 mg | ORAL_TABLET | Freq: Every day | ORAL | Status: DC

## 2013-06-23 NOTE — Addendum Note (Signed)
Addended by: Gwendolyn Grant A on: 06/23/2013 09:09 AM   Modules accepted: Orders

## 2013-07-05 ENCOUNTER — Telehealth: Payer: Self-pay | Admitting: *Deleted

## 2013-07-12 ENCOUNTER — Ambulatory Visit
Admission: RE | Admit: 2013-07-12 | Discharge: 2013-07-12 | Disposition: A | Discharge status: Home / Self Care | Payer: Managed Care, Other (non HMO) | Source: Ambulatory Visit | Attending: Internal Medicine | Admitting: Internal Medicine

## 2013-07-12 DIAGNOSIS — Z1239 Encounter for other screening for malignant neoplasm of breast: Secondary | ICD-10-CM

## 2013-07-12 NOTE — Telephone Encounter (Signed)
Pt called and stated she would like a referral to [place] [facility]. Want to follow-up on the hernia ? If she need to have remove. Hernia is getting bigger...Johny Chess

## 2013-07-13 NOTE — Telephone Encounter (Signed)
Larger hernias are safer hernias. Please consider OV if any discomfort. thanks

## 2013-07-14 NOTE — Telephone Encounter (Signed)
Called the patient informed of MD instructions.  Patient just found out she can go see her surgeon without a referral Dr. Harlow Asa.  She may schedule and have him take a look at the hernia.  Patient will call back if needs an OV.

## 2013-07-20 ENCOUNTER — Ambulatory Visit (INDEPENDENT_AMBULATORY_CARE_PROVIDER_SITE_OTHER): Payer: Managed Care, Other (non HMO) | Admitting: *Deleted

## 2013-07-20 VITALS — BP 130/78 | Wt 208.2 lb

## 2013-07-20 DIAGNOSIS — I10 Essential (primary) hypertension: Secondary | ICD-10-CM

## 2013-08-09 ENCOUNTER — Encounter (INDEPENDENT_AMBULATORY_CARE_PROVIDER_SITE_OTHER): Payer: Self-pay | Admitting: Surgery

## 2013-08-09 ENCOUNTER — Ambulatory Visit (INDEPENDENT_AMBULATORY_CARE_PROVIDER_SITE_OTHER): Payer: Managed Care, Other (non HMO) | Admitting: Surgery

## 2013-08-09 ENCOUNTER — Telehealth: Payer: Self-pay | Admitting: Internal Medicine

## 2013-08-09 VITALS — BP 130/70 | HR 51 | Temp 97.5°F | Ht 66.0 in | Wt 207.0 lb

## 2013-08-09 DIAGNOSIS — R935 Abnormal findings on diagnostic imaging of other abdominal regions, including retroperitoneum: Secondary | ICD-10-CM

## 2013-08-09 DIAGNOSIS — R109 Unspecified abdominal pain: Secondary | ICD-10-CM

## 2013-08-09 DIAGNOSIS — M6200 Separation of muscle (nontraumatic), unspecified site: Secondary | ICD-10-CM

## 2013-08-09 DIAGNOSIS — M6208 Separation of muscle (nontraumatic), other site: Secondary | ICD-10-CM

## 2013-08-09 NOTE — Telephone Encounter (Signed)
Patient saw surgeon at [facility].  She does not have a hernia.  She now wants information so she can make an appointment with Dr. Fuller Plan for a colonoscopy.  She asked to speak with Lorre Nick about this.

## 2013-08-09 NOTE — Progress Notes (Signed)
General Surgery [facility]  Chief Complaint  Patient presents with  . New Evaluation    evaluate umbilical hernia - patient is self-referred    HISTORY: Patient is a 63 year old female known to our practice many years ago after umbilical hernia repair by Dr. Benard Rink. Patient apparently had 2 repairs with her most recent being in 1976. She has had intermittent abdominal complaints including an episode of acute diverticulitis. Approximately one year ago the patient was doing strenuous lifting and noted a bulge in the central abdomen. She is concerned about a recurrence of her umbilical hernia. She presents today for evaluation.  Review of the medical records includes a CT scan performed one year ago in May 2014 to evaluate her diverticulitis. Upon reviewing these films there is no evidence of incisional or ventral hernia. Patient and I reviewed these films while she was in the office today.  Past Medical History  Diagnosis Date  . POSTMENOPAUSAL STATUS   . DYSLIPIDEMIA   . HYPERTENSION   . Arthritis   . Colitis     hx colitis-gi bleed-2006  . Partial tear of subscapularis tendon 04/22/2012  . Supraspinatus tendon tear 04/22/2012  . Hx of cardiovascular stress test     Lexiscan Myoview 9/14:  Small, fixed apical anterior perfusion defect (worse at rest) - probable soft tissue attenuation, EF 61%, low risk study    Current Outpatient Prescriptions  Medication Sig Dispense Refill  . amLODipine (NORVASC) 2.5 MG tablet Take 1 tablet (2.5 mg total) by mouth daily.  90 tablet  3  . aspirin EC 325 MG tablet Take 1 tablet (325 mg total) by mouth daily.  100 tablet  3  . olmesartan-hydrochlorothiazide (BENICAR HCT) 40-25 MG per tablet Take 1 tablet by mouth daily.  90 tablet  3  . ezetimibe (ZETIA) 10 MG tablet Take 1 tablet (10 mg total) by mouth daily.  90 tablet  3   No current facility-administered medications for this visit.    Allergies  Allergen Reactions   . Codeine   . Statins     Family History  Problem Relation Age of Onset  . Hypertension Father     History   Social History  . Marital Status: Married    Spouse Name: N/A    Number of Children: N/A  . Years of Education: N/A   Social History Main Topics  . Smoking status: Never Smoker   . Smokeless tobacco: None     Comment: exposed to second hand (spouse smokes), Married  . Alcohol Use: No  . Drug Use: No  . Sexual Activity: None   Other Topics Concern  . None   Social History Narrative  . None    REVIEW OF SYSTEMS - PERTINENT POSITIVES ONLY: Denies signs or symptoms of obstruction.  EXAM: Filed Vitals:   08/09/13 1024  BP: 130/70  Pulse: 51  Temp: 97.5 F (36.4 C)    GENERAL: well-developed, well-nourished, no acute distress HEENT: normocephalic; pupils equal and reactive; sclerae clear; dentition good; mucous membranes moist NECK:  No palpable masses in the thyroid bed; symmetric on extension; no palpable anterior or posterior cervical lymphadenopathy; no supraclavicular masses; no tenderness CHEST: clear to auscultation bilaterally without rales, rhonchi, or wheezes CARDIAC: regular rate and rhythm without significant murmur; peripheral pulses are full ABDOMEN: soft without distension; bowel sounds present; no mass; no hepatosplenomegaly; well-healed surgical incisions; with sit up maneuver there is a moderate rectus diastasis. On palpation there is no fascial  defect in the midline or around the umbilicus and there is no sign of hernia with Valsalva and cough. EXT:  non-tender without edema; no deformity NEURO: no gross focal deficits; no sign of tremor   LABORATORY RESULTS: See [facility] (CHL-Epic) for most recent results  RADIOLOGY RESULTS: See [facility] (CHL-Epic) for most recent results  IMPRESSION: #1 rectus diastasis, moderate #2 no evidence of recurrent umbilical hernia  PLAN: Patient and I reviewed the CT scan from May of  2014. We discussed her physical examination. I provided her with written material on rectus diastasis.  There is no indication for surgical intervention. There is no restriction on the patient's physical activity.  Patient will return for surgical care as needed.  Earnstine Regal, MD, [facility]  Primary Care Physician: Gwendolyn Grant, MD

## 2013-08-10 NOTE — Telephone Encounter (Signed)
Called pt to clarify msg. Pt states she saw Dr. Lynford Humphrey yesterday he said she didn't have a hernia problem. He is suggesting that pt have a colonscopy. Pt states she had one about 7 years ago with Dr. Fuller Plan. Pt is very concern about the weight gain. She states she has gain 25lbs in one year. Dr. Lynford Humphrey was suppose to fax Dr. Asa Lente the report from yesterday...Johny Chess

## 2013-08-10 NOTE — Addendum Note (Signed)
Addended by: Gwendolyn Grant A on: 08/10/2013 02:31 PM   Modules accepted: Orders

## 2013-08-10 NOTE — Telephone Encounter (Addendum)
Report from Dr Harlow Asa in [facility] reviewed will refer to Dr Fuller Plan as requested due to abn CT 06/2013 thanks

## 2013-08-15 ENCOUNTER — Encounter: Payer: Self-pay | Admitting: Gastroenterology

## 2013-08-21 ENCOUNTER — Telehealth: Payer: Self-pay | Admitting: Gastroenterology

## 2013-08-21 NOTE — Telephone Encounter (Signed)
Patient rescheduled to 08/23/13

## 2013-08-21 NOTE — Telephone Encounter (Signed)
Left message for patient to call back  

## 2013-08-23 ENCOUNTER — Encounter: Payer: Self-pay | Admitting: Gastroenterology

## 2013-08-23 ENCOUNTER — Ambulatory Visit (INDEPENDENT_AMBULATORY_CARE_PROVIDER_SITE_OTHER): Payer: Managed Care, Other (non HMO) | Admitting: Gastroenterology

## 2013-08-23 VITALS — BP 128/78 | HR 80 | Ht 66.0 in | Wt 207.6 lb

## 2013-08-23 DIAGNOSIS — R198 Other specified symptoms and signs involving the digestive system and abdomen: Secondary | ICD-10-CM

## 2013-08-23 DIAGNOSIS — K5900 Constipation, unspecified: Secondary | ICD-10-CM

## 2013-08-23 DIAGNOSIS — R933 Abnormal findings on diagnostic imaging of other parts of digestive tract: Secondary | ICD-10-CM

## 2013-08-23 MED ORDER — PEG-KCL-NACL-NASULF-NA ASC-C 100 G PO SOLR: 1.0000 | Freq: Once | ORAL | Status: DC

## 2013-08-23 NOTE — Patient Instructions (Signed)
Start over the counter Miralax mixing 17 grams in 8 oz of water daily.   You have been scheduled for a colonoscopy. Please follow written instructions given to you at your visit today.  Please pick up your prep kit at the pharmacy within the next 1-3 days. If you use inhalers (even only as needed), please bring them with you on the day of your procedure. Your physician has requested that you go to www.startemmi.com and enter the access code given to you at your visit today. This web site gives a general overview about your procedure. However, you should still follow specific instructions given to you by our office regarding your preparation for the procedure.  Thank you for choosing me and [facility].  Pricilla Riffle. Dagoberto Ligas., MD., Marval Regal

## 2013-08-23 NOTE — Progress Notes (Signed)
    History of Present Illness: This is a 63 year old female who complains of a slight change in bowel habits with more constipation and bloating and mild right-sided abdominal pain intermittently over the past year or so. She previously underwent colonoscopy in 2006 showing mild changes of a self-limited colitis and sigmoid diverticulosis. Denies weight loss, diarrhea, change in stool caliber, melena, hematochezia, nausea, vomiting, dysphagia, reflux symptoms, chest pain.  Abd/pelvic CT 06/2012 show diverticulitis and cholelithiasis  Review of Systems: Pertinent positive and negative review of systems were noted in the above HPI section. All other review of systems were otherwise negative.  Current Medications, Allergies, Past Medical History, Past Surgical History, Family History and Social History were reviewed in [employer] record.  Physical Exam: General: Well developed , well nourished, no acute distress Head: Normocephalic and atraumatic Eyes:  sclerae anicteric, EOMI Ears: Normal auditory acuity Mouth: No deformity or lesions Neck: Supple, no masses or thyromegaly Lungs: Clear throughout to auscultation Heart: Regular rate and rhythm; no murmurs, rubs or bruits Abdomen: Soft, non tender and non distended. Diastasis rectus. No masses, hepatosplenomegaly or hernias noted. Normal Bowel sounds Rectal: deferred to colonoscopy Musculoskeletal: Symmetrical with no gross deformities  Skin: No lesions on visible extremities Pulses:  Normal pulses noted Extremities: No clubbing, cyanosis, edema or deformities noted Neurological: Alert oriented x 4, grossly nonfocal Cervical Nodes:  No significant cervical adenopathy Inguinal Nodes: No significant inguinal adenopathy Psychological:  Alert and cooperative. Normal mood and affect  Assessment and Recommendations:  1. Abnl CT of colon. Change in bowel habits. Constipation. Bloating. Mild right-sided abdominal pain. I  suspect she has mild constipation causing her mild abdominal pain and bloating. Begin Miralax daily. The risks, benefits, and alternatives to colonoscopy with possible biopsy and possible polypectomy were discussed with the patient and they consent to proceed.   2. Rectus diastasis.   3. Cholelithiasis. Asymptomatic.

## 2013-08-30 ENCOUNTER — Encounter: Payer: Self-pay | Admitting: Gastroenterology

## 2013-08-30 DIAGNOSIS — D126 Benign neoplasm of colon, unspecified: Secondary | ICD-10-CM

## 2013-08-30 HISTORY — DX: Benign neoplasm of colon, unspecified: D12.6

## 2013-09-05 ENCOUNTER — Ambulatory Visit (AMBULATORY_SURGERY_CENTER): Payer: Managed Care, Other (non HMO) | Admitting: Gastroenterology

## 2013-09-05 ENCOUNTER — Encounter: Payer: Self-pay | Admitting: Gastroenterology

## 2013-09-05 VITALS — BP 174/78 | HR 55 | Temp 96.8°F | Resp 24 | Ht 66.0 in | Wt 207.0 lb

## 2013-09-05 DIAGNOSIS — D126 Benign neoplasm of colon, unspecified: Secondary | ICD-10-CM

## 2013-09-05 DIAGNOSIS — R198 Other specified symptoms and signs involving the digestive system and abdomen: Secondary | ICD-10-CM

## 2013-09-05 DIAGNOSIS — R933 Abnormal findings on diagnostic imaging of other parts of digestive tract: Secondary | ICD-10-CM

## 2013-09-05 MED ORDER — SODIUM CHLORIDE 0.9 % IV SOLN: 500.0000 mL | INTRAVENOUS | Status: DC

## 2013-09-05 NOTE — Progress Notes (Signed)
Patient awake and alert, vss, report to rn 

## 2013-09-05 NOTE — Progress Notes (Signed)
Called to room to assist during endoscopic procedure.  Patient ID and intended procedure confirmed with present staff. Received instructions for my participation in the procedure from the performing physician.  

## 2013-09-05 NOTE — Patient Instructions (Signed)
YOU HAD AN ENDOSCOPIC PROCEDURE TODAY AT THE [facility]: Refer to the procedure report that was given to you for any specific questions about what was found during the examination.  If the procedure report does not answer your questions, please call your gastroenterologist to clarify.  If you requested that your care partner not be given the details of your procedure findings, then the procedure report has been included in a sealed envelope for you to review at your convenience later.  YOU SHOULD EXPECT: Some feelings of bloating in the abdomen. Passage of more gas than usual.  Walking can help get rid of the air that was put into your GI tract during the procedure and reduce the bloating. If you had a lower endoscopy (such as a colonoscopy or flexible sigmoidoscopy) you may notice spotting of blood in your stool or on the toilet paper. If you underwent a bowel prep for your procedure, then you may not have a normal bowel movement for a few days.  DIET: Your first meal following the procedure should be a light meal and then it is ok to progress to your normal diet.  A half-sandwich or bowl of soup is an example of a good first meal.  Heavy or fried foods are harder to digest and may make you feel nauseous or bloated.  Likewise meals heavy in dairy and vegetables can cause extra gas to form and this can also increase the bloating.  Drink plenty of fluids but you should avoid alcoholic beverages for 24 hours.  ACTIVITY: Your care partner should take you home directly after the procedure.  You should plan to take it easy, moving slowly for the rest of the day.  You can resume normal activity the day after the procedure however you should NOT DRIVE or use heavy machinery for 24 hours (because of the sedation medicines used during the test).    SYMPTOMS TO REPORT IMMEDIATELY: A gastroenterologist can be reached at any hour.  During normal business hours, 8:30 AM to 5:00 PM Monday through Friday,  call [phone].  After hours and on weekends, please call the GI answering service at [phone] who will take a message and have the physician on call contact you.   Following lower endoscopy (colonoscopy or flexible sigmoidoscopy):  Excessive amounts of blood in the stool  Significant tenderness or worsening of abdominal pains  Swelling of the abdomen that is new, acute  Fever of 100F or higher    FOLLOW UP: If any biopsies were taken you will be contacted by phone or by letter within the next 1-3 weeks.  Call your gastroenterologist if you have not heard about the biopsies in 3 weeks.  Our staff will call the home number listed on your records the next business day following your procedure to check on you and address any questions or concerns that you may have at that time regarding the information given to you following your procedure. This is a courtesy call and so if there is no answer at the home number and we have not heard from you through the emergency physician on call, we will assume that you have returned to your regular daily activities without incident.  SIGNATURES/CONFIDENTIALITY: You and/or your care partner have signed paperwork which will be entered into your electronic medical record.  These signatures attest to the fact that that the information above on your After Visit Summary has been reviewed and is understood.  Full responsibility of the confidentiality   of this discharge information lies with you and/or your care-partner.   Information on polyps & diverticulosis &high fiber diet given to you today  Miralax 1- 2 times a day

## 2013-09-05 NOTE — Op Note (Signed)
[place]  [address]. [place], [id number]   COLONOSCOPY PROCEDURE REPORT  PATIENT: Bailey [name], Bailey [name]  MR#: [id number] BIRTHDATE: Sep 15, 1950 , 33  yrs. old GENDER: Female ENDOSCOPIST: Ladene Artist, MD, [facility] PROCEDURE DATE:  09/05/2013 PROCEDURE:   Colonoscopy with snare polypectomy First Screening Colonoscopy - Avg.  risk and is 50 yrs.  old or older - No.  Prior Negative Screening - Now for repeat screening. N/A  History of Adenoma - Now for follow-up colonoscopy & has been > or = to 3 yrs.  N/A  Polyps Removed Today? Yes. ASA CLASS:   Class II INDICATIONS:an abnormal CT and Change in bowel habits. MEDICATIONS: MAC sedation, administered by CRNA and propofol (Diprivan) 300mg  IV DESCRIPTION OF PROCEDURE:   After the risks benefits and alternatives of the procedure were thoroughly explained, informed consent was obtained.  A digital rectal exam revealed no abnormalities of the rectum.   The LB [id number]  endoscope was introduced through the anus and advanced to the cecum, which was identified by both the appendix and ileocecal valve. No adverse events experienced.   The quality of the prep was good, using MoviPrep  The instrument was then slowly withdrawn as the colon was fully examined.  COLON FINDINGS: A pedunculated polyp measuring 1 cm in size was found in the sigmoid colon.  A polypectomy was performed using snare cautery.  The resection was complete and the polyp tissue was completely retrieved.   A sessile polyp measuring 4 mm in size was found in the sigmoid colon.  A polypectomy was performed with a cold snare.  The resection was complete and the polyp tissue was completely retrieved. Moderate diverticulosis was noted in the descending colon and sigmoid colon.   The colon was otherwise normal.  There was no diverticulosis, inflammation, polyps or cancers unless previously stated.  Retroflexed views revealed no abnormalities. The time to  cecum=2 minutes 47 seconds.  Withdrawal time=11 minutes 46 seconds.  The scope was withdrawn and the procedure completed. COMPLICATIONS: There were no complications.  ENDOSCOPIC IMPRESSION: 1.   Pedunculated polyp measuring 1 cm in the sigmoid colon; polypectomy performed using snare cautery 2.   Sessile polyp measuring 4 mm in the sigmoid colon; polypectomy performed with a cold snare 3.   Moderate diverticulosis in the descending colon and sigmoid colon  RECOMMENDATIONS: 1.  Await pathology results 2.  Repeat colonoscopy in 5 years if polyp(s) adenomatous; otherwise 10 years 3.  High fiber diet with liberal fluid intake. 4.  Miralax 1-2 times daily  eSigned:  Ladene Artist, MD, [facility] 09/05/2013 2:39 PM

## 2013-09-06 ENCOUNTER — Telehealth: Payer: Self-pay | Admitting: *Deleted

## 2013-09-06 NOTE — Telephone Encounter (Signed)
  Follow up Call-  Call back number 09/05/2013  Post procedure Call Back phone  # [phone]  Permission to leave phone message Yes     Patient questions:  Do you have a fever, pain , or abdominal swelling? No. Pain Score  0 *  Have you tolerated food without any problems? Yes.    Have you been able to return to your normal activities? Yes.    Do you have any questions about your discharge instructions: Diet   No. Medications  No. Follow up visit  No.  Do you have questions or concerns about your Care? No.  Actions: * If pain score is 4 or above: No action needed, pain <4.

## 2013-09-12 ENCOUNTER — Encounter: Payer: Self-pay | Admitting: Gastroenterology

## 2013-10-25 ENCOUNTER — Ambulatory Visit: Payer: Managed Care, Other (non HMO) | Admitting: Gastroenterology

## 2013-12-19 ENCOUNTER — Telehealth: Payer: Self-pay | Admitting: Internal Medicine

## 2013-12-19 NOTE — Telephone Encounter (Signed)
Ok Thx 

## 2013-12-19 NOTE — Telephone Encounter (Signed)
Patient states her spouse and father is Dr. Parks [name] patients.  She is currently Dr. Asa [name] patient.  She is requesting to transfer to Dr. Camila [name] since Dr. Asa [name] is part time in office.

## 2013-12-20 NOTE — Telephone Encounter (Signed)
Set up for Nov 9th

## 2013-12-27 ENCOUNTER — Ambulatory Visit: Payer: Managed Care, Other (non HMO) | Admitting: Internal Medicine

## 2014-01-08 ENCOUNTER — Encounter: Payer: Self-pay | Admitting: Internal Medicine

## 2014-01-08 ENCOUNTER — Ambulatory Visit (INDEPENDENT_AMBULATORY_CARE_PROVIDER_SITE_OTHER): Payer: Managed Care, Other (non HMO) | Admitting: Internal Medicine

## 2014-01-08 VITALS — BP 130/88 | HR 55 | Temp 98.2°F | Wt 204.0 lb

## 2014-01-08 DIAGNOSIS — I10 Essential (primary) hypertension: Secondary | ICD-10-CM

## 2014-01-08 DIAGNOSIS — E559 Vitamin D deficiency, unspecified: Secondary | ICD-10-CM

## 2014-01-08 DIAGNOSIS — E785 Hyperlipidemia, unspecified: Secondary | ICD-10-CM

## 2014-01-08 DIAGNOSIS — E669 Obesity, unspecified: Secondary | ICD-10-CM

## 2014-01-08 DIAGNOSIS — K8020 Calculus of gallbladder without cholecystitis without obstruction: Secondary | ICD-10-CM

## 2014-01-08 DIAGNOSIS — R202 Paresthesia of skin: Secondary | ICD-10-CM

## 2014-01-08 MED ORDER — ASPIRIN EC 81 MG PO TBEC: 81.0000 mg | DELAYED_RELEASE_TABLET | Freq: Every day | ORAL | Status: DC

## 2014-01-08 MED ORDER — VITAMIN D 1000 UNITS PO TABS: 1000.0000 [IU] | ORAL_TABLET | Freq: Every day | ORAL | Status: AC

## 2014-01-08 NOTE — Progress Notes (Signed)
   Subjective:    HPI  New pt - switching from Dr Asa Lente  We need to address dyslipidemia (statin intolerant), HTN, OA   BP Readings from Last 3 Encounters:  01/08/14 130/88  09/05/13 174/78  08/23/13 128/78   Wt Readings from Last 3 Encounters:  01/08/14 204 lb (92.534 kg)  09/05/13 207 lb (93.895 kg)  08/23/13 207 lb 9.6 oz (94.167 kg)     Review of Systems  Constitutional: Negative for chills, activity change, appetite change, fatigue and unexpected weight change.  HENT: Negative for congestion, mouth sores and sinus pressure.   Eyes: Negative for visual disturbance.  Respiratory: Negative for cough and chest tightness.   Gastrointestinal: Negative for nausea and abdominal pain.  Genitourinary: Negative for frequency, difficulty urinating and vaginal pain.  Musculoskeletal: Negative for back pain and gait problem.  Skin: Negative for pallor and rash.  Neurological: Negative for dizziness, tremors, weakness, numbness and headaches.  Psychiatric/Behavioral: Negative for confusion and sleep disturbance.       Objective:   Physical Exam  Constitutional: She appears well-developed. No distress.  HENT:  Head: Normocephalic.  Right Ear: External ear normal.  Left Ear: External ear normal.  Nose: Nose normal.  Mouth/Throat: Oropharynx is clear and moist.  Eyes: Conjunctivae are normal. Pupils are equal, round, and reactive to light. Right eye exhibits no discharge. Left eye exhibits no discharge.  Neck: Normal range of motion. Neck supple. No JVD present. No tracheal deviation present. No thyromegaly present.  Cardiovascular: Normal rate, regular rhythm and normal heart sounds.   Pulmonary/Chest: No stridor. No respiratory distress. She has no wheezes.  Abdominal: Soft. Bowel sounds are normal. She exhibits no distension and no mass. There is no tenderness. There is no rebound and no guarding.  Musculoskeletal: She exhibits no edema or tenderness.  Lymphadenopathy:    She has no cervical adenopathy.  Neurological: She displays normal reflexes. No cranial nerve deficit. She exhibits normal muscle tone. Coordination normal.  Skin: No rash noted. No erythema.  Psychiatric: She has a normal mood and affect. Her behavior is normal. Judgment and thought content normal.   Lab Results  Component Value Date   WBC 8.0 06/22/2013   HGB 14.9 06/22/2013   HCT 43.7 06/22/2013   PLT 257.0 06/22/2013   GLUCOSE 120* 06/22/2013   CHOL 248* 06/22/2013   TRIG 101.0 06/22/2013   HDL 46.30 06/22/2013   LDLDIRECT 177.1 09/20/2012   LDLCALC 182* 06/22/2013   ALT 24 06/22/2013   AST 21 06/22/2013   NA 138 06/22/2013   K 3.6 06/22/2013   CL 102 06/22/2013   CREATININE 0.7 06/22/2013   BUN 13 06/22/2013   CO2 29 06/22/2013   TSH 2.97 06/22/2013          Assessment & Plan:

## 2014-01-08 NOTE — Progress Notes (Deleted)
Pre visit review using our clinic review tool, if applicable. No additional management support is needed unless otherwise documented below in the visit note. 

## 2014-01-08 NOTE — Assessment & Plan Note (Addendum)
Chronic  Continue with current prescription therapy as reflected on the Med list.  

## 2014-01-08 NOTE — Assessment & Plan Note (Signed)
Chronic  Statin intolerant On diet now

## 2014-01-08 NOTE — Assessment & Plan Note (Signed)
11/15 pt has hired a Automotive engineer - on diet now

## 2014-01-09 ENCOUNTER — Telehealth: Payer: Self-pay | Admitting: Internal Medicine

## 2014-01-09 NOTE — Telephone Encounter (Signed)
emmi mailed  °

## 2014-01-09 NOTE — Assessment & Plan Note (Signed)
Asymptomatic S/p surgical referal

## 2014-03-14 ENCOUNTER — Ambulatory Visit: Payer: Managed Care, Other (non HMO) | Admitting: Internal Medicine

## 2014-04-16 ENCOUNTER — Ambulatory Visit: Payer: Managed Care, Other (non HMO) | Admitting: Internal Medicine

## 2014-06-19 ENCOUNTER — Telehealth: Payer: Self-pay | Admitting: Internal Medicine

## 2014-06-19 MED ORDER — OLMESARTAN MEDOXOMIL-HCTZ 40-25 MG PO TABS: ORAL_TABLET | ORAL | Status: DC

## 2014-06-19 NOTE — Telephone Encounter (Signed)
Ok to decrease down to 20/12.5. OV as soon as she can make it. Thx

## 2014-06-19 NOTE — Telephone Encounter (Signed)
Pt called in and has a few question about some meds and would like a call back from nurse    Cell number -[phone]

## 2014-06-19 NOTE — Telephone Encounter (Signed)
A year and a half ago, Dr. Asa Lente increased her Benicar, Amlodipine 2.5 mg and ASA 81 mg. Pt feels her dose is too high, She c/o frequent urination during the night. Her recent at home BP readings are between 115-120/ 72-82. She can not come in for OV at this time due to her father's Bailey [name]) health. She is requesting a decrease of Benicar/ Hct back down to 20/12.5mg .  If we change meds, please send to [facility] mail order.

## 2014-06-20 MED ORDER — OLMESARTAN MEDOXOMIL-HCTZ 20-12.5 MG PO TABS: 1.0000 | ORAL_TABLET | Freq: Every day | ORAL | Status: DC

## 2014-06-20 NOTE — Telephone Encounter (Signed)
Pt informed- new Rx sent per pt request. OV scheduled for 07/27/14.

## 2014-06-21 ENCOUNTER — Ambulatory Visit: Payer: Managed Care, Other (non HMO) | Admitting: Internal Medicine

## 2014-07-27 ENCOUNTER — Encounter: Payer: Self-pay | Admitting: Internal Medicine

## 2014-07-27 ENCOUNTER — Ambulatory Visit (INDEPENDENT_AMBULATORY_CARE_PROVIDER_SITE_OTHER): Payer: Managed Care, Other (non HMO) | Admitting: Internal Medicine

## 2014-07-27 ENCOUNTER — Other Ambulatory Visit (INDEPENDENT_AMBULATORY_CARE_PROVIDER_SITE_OTHER): Payer: Managed Care, Other (non HMO)

## 2014-07-27 VITALS — BP 144/80 | HR 48 | Temp 97.6°F | Ht 66.0 in | Wt 203.0 lb

## 2014-07-27 DIAGNOSIS — I10 Essential (primary) hypertension: Secondary | ICD-10-CM | POA: Diagnosis not present

## 2014-07-27 DIAGNOSIS — E785 Hyperlipidemia, unspecified: Secondary | ICD-10-CM

## 2014-07-27 DIAGNOSIS — R5383 Other fatigue: Secondary | ICD-10-CM | POA: Diagnosis not present

## 2014-07-27 DIAGNOSIS — E669 Obesity, unspecified: Secondary | ICD-10-CM | POA: Diagnosis not present

## 2014-07-27 DIAGNOSIS — Z780 Asymptomatic menopausal state: Secondary | ICD-10-CM | POA: Diagnosis not present

## 2014-07-27 DIAGNOSIS — R202 Paresthesia of skin: Secondary | ICD-10-CM

## 2014-07-27 DIAGNOSIS — K5901 Slow transit constipation: Secondary | ICD-10-CM

## 2014-07-27 DIAGNOSIS — E559 Vitamin D deficiency, unspecified: Secondary | ICD-10-CM

## 2014-07-27 DIAGNOSIS — K5900 Constipation, unspecified: Secondary | ICD-10-CM | POA: Insufficient documentation

## 2014-07-27 LAB — URINALYSIS, ROUTINE W REFLEX MICROSCOPIC
Bilirubin Urine: NEGATIVE
Ketones, ur: NEGATIVE
NITRITE: NEGATIVE
SPECIFIC GRAVITY, URINE: 1.01 (ref 1.000–1.030)
Total Protein, Urine: NEGATIVE
UROBILINOGEN UA: 0.2 (ref 0.0–1.0)
Urine Glucose: NEGATIVE
pH: 6.5 (ref 5.0–8.0)

## 2014-07-27 LAB — CBC WITH DIFFERENTIAL/PLATELET
BASOS PCT: 0.3 % (ref 0.0–3.0)
Basophils Absolute: 0 10*3/uL (ref 0.0–0.1)
EOS PCT: 0.5 % (ref 0.0–5.0)
Eosinophils Absolute: 0 10*3/uL (ref 0.0–0.7)
HCT: 43 % (ref 36.0–46.0)
Hemoglobin: 14.9 g/dL (ref 12.0–15.0)
LYMPHS ABS: 3.1 10*3/uL (ref 0.7–4.0)
Lymphocytes Relative: 29.5 % (ref 12.0–46.0)
MCHC: 34.6 g/dL (ref 30.0–36.0)
MCV: 91.7 fl (ref 78.0–100.0)
MONO ABS: 0.7 10*3/uL (ref 0.1–1.0)
Monocytes Relative: 7 % (ref 3.0–12.0)
Neutro Abs: 6.6 10*3/uL (ref 1.4–7.7)
Neutrophils Relative %: 62.7 % (ref 43.0–77.0)
Platelets: 281 10*3/uL (ref 150.0–400.0)
RBC: 4.69 Mil/uL (ref 3.87–5.11)
RDW: 13.3 % (ref 11.5–15.5)
WBC: 10.6 10*3/uL — ABNORMAL HIGH (ref 4.0–10.5)

## 2014-07-27 LAB — BASIC METABOLIC PANEL
BUN: 16 mg/dL (ref 6–23)
CALCIUM: 10.3 mg/dL (ref 8.4–10.5)
CO2: 31 mEq/L (ref 19–32)
Chloride: 100 mEq/L (ref 96–112)
Creatinine, Ser: 0.76 mg/dL (ref 0.40–1.20)
GFR: 81.44 mL/min (ref >60.00)
Glucose, Bld: 98 mg/dL (ref 70–99)
POTASSIUM: 3.8 meq/L (ref 3.5–5.1)
SODIUM: 136 meq/L (ref 135–145)

## 2014-07-27 LAB — HEPATIC FUNCTION PANEL
ALK PHOS: 66 U/L (ref 39–117)
ALT: 17 U/L (ref 0–35)
AST: 15 U/L (ref 0–37)
Albumin: 4.3 g/dL (ref 3.5–5.2)
BILIRUBIN TOTAL: 0.5 mg/dL (ref 0.2–1.2)
Bilirubin, Direct: 0.1 mg/dL (ref 0.0–0.3)
Total Protein: 7.6 g/dL (ref 6.0–8.3)

## 2014-07-27 LAB — LIPID PANEL
CHOL/HDL RATIO: 5
CHOLESTEROL: 251 mg/dL — AB (ref 0–200)
HDL: 52.2 mg/dL (ref >39.00)
LDL CALC: 171 mg/dL — AB (ref 0–99)
NonHDL: 198.8
Triglycerides: 138 mg/dL (ref 0.0–149.0)
VLDL: 27.6 mg/dL (ref 0.0–40.0)

## 2014-07-27 LAB — VITAMIN B12: VITAMIN B 12: 378 pg/mL (ref 211–911)

## 2014-07-27 LAB — TSH: TSH: 1.72 u[IU]/mL (ref 0.35–4.50)

## 2014-07-27 LAB — VITAMIN D 25 HYDROXY (VIT D DEFICIENCY, FRACTURES): VITD: 18.94 ng/mL — ABNORMAL LOW (ref 30.00–100.00)

## 2014-07-27 MED ORDER — MEGARED OMEGA-3 KRILL OIL 500 MG PO CAPS: 1.0000 | ORAL_CAPSULE | Freq: Every morning | ORAL | Status: DC

## 2014-07-27 MED ORDER — LUBIPROSTONE 24 MCG PO CAPS: 24.0000 ug | ORAL_CAPSULE | Freq: Two times a day (BID) | ORAL | Status: DC

## 2014-07-27 NOTE — Assessment & Plan Note (Addendum)
Low fat diet Krill oil Baby ASA

## 2014-07-27 NOTE — Assessment & Plan Note (Addendum)
Labs Take Amlodipine at HS

## 2014-07-27 NOTE — Assessment & Plan Note (Signed)
Amitiza prn

## 2014-07-27 NOTE — Assessment & Plan Note (Signed)
On Vit D 

## 2014-07-27 NOTE — Assessment & Plan Note (Addendum)
Chronic  Olmesartan-HCT, Amlodipine

## 2014-07-27 NOTE — Progress Notes (Signed)
Pre visit review using our clinic review tool, if applicable. No additional management support is needed unless otherwise documented below in the visit note. 

## 2014-07-27 NOTE — Progress Notes (Signed)
   Subjective:    HPI    We need to address dyslipidemia (statin intolerant), HTN, OA   BP Readings from Last 3 Encounters:  07/27/14 144/80  01/08/14 130/88  09/05/13 174/78   Wt Readings from Last 3 Encounters:  07/27/14 203 lb (92.08 kg)  01/08/14 204 lb (92.534 kg)  09/05/13 207 lb (93.895 kg)     Review of Systems  Constitutional: Negative for chills, activity change, appetite change, fatigue and unexpected weight change.  HENT: Negative for congestion, mouth sores and sinus pressure.   Eyes: Negative for visual disturbance.  Respiratory: Negative for cough and chest tightness.   Gastrointestinal: Negative for nausea and abdominal pain.  Genitourinary: Negative for frequency, difficulty urinating and vaginal pain.  Musculoskeletal: Negative for back pain and gait problem.  Skin: Negative for pallor and rash.  Neurological: Negative for dizziness, tremors, weakness, numbness and headaches.  Psychiatric/Behavioral: Negative for confusion and sleep disturbance.       Objective:   Physical Exam  Constitutional: She appears well-developed. No distress.  HENT:  Head: Normocephalic.  Right Ear: External ear normal.  Left Ear: External ear normal.  Nose: Nose normal.  Mouth/Throat: Oropharynx is clear and moist.  Eyes: Conjunctivae are normal. Pupils are equal, round, and reactive to light. Right eye exhibits no discharge. Left eye exhibits no discharge.  Neck: Normal range of motion. Neck supple. No JVD present. No tracheal deviation present. No thyromegaly present.  Cardiovascular: Normal rate, regular rhythm and normal heart sounds.   Pulmonary/Chest: No stridor. No respiratory distress. She has no wheezes.  Abdominal: Soft. Bowel sounds are normal. She exhibits no distension and no mass. There is no tenderness. There is no rebound and no guarding.  Musculoskeletal: She exhibits no edema or tenderness.  Lymphadenopathy:    She has no cervical adenopathy.    Neurological: She displays normal reflexes. No cranial nerve deficit. She exhibits normal muscle tone. Coordination normal.  Skin: No rash noted. No erythema.  Psychiatric: She has a normal mood and affect. Her behavior is normal. Judgment and thought content normal.   Lab Results  Component Value Date   WBC 8.0 06/22/2013   HGB 14.9 06/22/2013   HCT 43.7 06/22/2013   PLT 257.0 06/22/2013   GLUCOSE 120* 06/22/2013   CHOL 248* 06/22/2013   TRIG 101.0 06/22/2013   HDL 46.30 06/22/2013   LDLDIRECT 177.1 09/20/2012   LDLCALC 182* 06/22/2013   ALT 24 06/22/2013   AST 21 06/22/2013   NA 138 06/22/2013   K 3.6 06/22/2013   CL 102 06/22/2013   CREATININE 0.7 06/22/2013   BUN 13 06/22/2013   CO2 29 06/22/2013   TSH 2.97 06/22/2013          Assessment & Plan:

## 2014-07-28 ENCOUNTER — Other Ambulatory Visit: Payer: Self-pay | Admitting: Internal Medicine

## 2014-07-28 MED ORDER — ERGOCALCIFEROL 1.25 MG (50000 UT) PO CAPS: 50000.0000 [IU] | ORAL_CAPSULE | ORAL | Status: DC

## 2014-07-28 MED ORDER — CIPROFLOXACIN HCL 250 MG PO TABS: 250.0000 mg | ORAL_TABLET | Freq: Two times a day (BID) | ORAL | Status: DC

## 2014-09-13 ENCOUNTER — Other Ambulatory Visit: Payer: Self-pay | Admitting: Internal Medicine

## 2014-09-17 ENCOUNTER — Other Ambulatory Visit: Payer: Self-pay | Admitting: *Deleted

## 2014-09-17 MED ORDER — AMLODIPINE BESYLATE 2.5 MG PO TABS: 2.5000 mg | ORAL_TABLET | Freq: Every day | ORAL | Status: DC

## 2014-10-29 ENCOUNTER — Encounter: Payer: Self-pay | Admitting: Internal Medicine

## 2014-10-29 ENCOUNTER — Ambulatory Visit (INDEPENDENT_AMBULATORY_CARE_PROVIDER_SITE_OTHER): Payer: Managed Care, Other (non HMO) | Admitting: Internal Medicine

## 2014-10-29 VITALS — BP 139/80 | HR 58 | Wt 201.0 lb

## 2014-10-29 DIAGNOSIS — M1710 Unilateral primary osteoarthritis, unspecified knee: Secondary | ICD-10-CM

## 2014-10-29 DIAGNOSIS — I10 Essential (primary) hypertension: Secondary | ICD-10-CM | POA: Diagnosis not present

## 2014-10-29 DIAGNOSIS — E785 Hyperlipidemia, unspecified: Secondary | ICD-10-CM

## 2014-10-29 DIAGNOSIS — E669 Obesity, unspecified: Secondary | ICD-10-CM

## 2014-10-29 DIAGNOSIS — IMO0002 Reserved for concepts with insufficient information to code with codable children: Secondary | ICD-10-CM

## 2014-10-29 DIAGNOSIS — M179 Osteoarthritis of knee, unspecified: Secondary | ICD-10-CM | POA: Insufficient documentation

## 2014-10-29 DIAGNOSIS — K5909 Other constipation: Secondary | ICD-10-CM | POA: Diagnosis not present

## 2014-10-29 DIAGNOSIS — M6208 Separation of muscle (nontraumatic), other site: Secondary | ICD-10-CM

## 2014-10-29 DIAGNOSIS — M159 Polyosteoarthritis, unspecified: Secondary | ICD-10-CM | POA: Insufficient documentation

## 2014-10-29 DIAGNOSIS — M1711 Unilateral primary osteoarthritis, right knee: Secondary | ICD-10-CM | POA: Insufficient documentation

## 2014-10-29 NOTE — Assessment & Plan Note (Signed)
Discussed w pt

## 2014-10-29 NOTE — Progress Notes (Signed)
Subjective:  Patient ID: Bailey [name], female    DOB: 1950-07-19  Age: 64 y.o. MRN: [id number]  CC: No chief complaint on file.   HPI Bailey [name] presents for HTN, L ankle OA, dyslipidemia, constipation f/u  Outpatient Prescriptions Prior to Visit  Medication Sig Dispense Refill  . amLODipine (NORVASC) 2.5 MG tablet Take 1 tablet (2.5 mg total) by mouth daily. 90 tablet 3  . aspirin EC 81 MG tablet Take 1 tablet (81 mg total) by mouth daily. 100 tablet 3  . BENICAR HCT 20-12.5 MG per tablet TAKE 1 TABLET BY MOUTH DAILY 90 tablet 1  . cholecalciferol (VITAMIN D) 1000 UNITS tablet Take 1 tablet (1,000 Units total) by mouth daily. 100 tablet 3  . lubiprostone (AMITIZA) 24 MCG capsule Take 1 capsule (24 mcg total) by mouth 2 (two) times daily with a meal. 60 capsule 5  . MEGARED OMEGA-3 KRILL OIL 500 MG CAPS Take 1 capsule by mouth every morning. 100 capsule 3  . ciprofloxacin (CIPRO) 250 MG tablet Take 1 tablet (250 mg total) by mouth 2 (two) times daily. 10 tablet 0  . ergocalciferol (VITAMIN D2) 50000 UNITS capsule Take 1 capsule (50,000 Units total) by mouth once a week. (Patient not taking: Reported on 10/29/2014) 6 capsule 0   No facility-administered medications prior to visit.    ROS Review of Systems  Constitutional: Positive for fatigue. Negative for chills, activity change, appetite change and unexpected weight change.  HENT: Negative for congestion, mouth sores and sinus pressure.   Eyes: Negative for visual disturbance.  Respiratory: Negative for cough and chest tightness.   Gastrointestinal: Positive for constipation. Negative for nausea and abdominal pain.  Genitourinary: Negative for frequency, difficulty urinating and vaginal pain.  Musculoskeletal: Negative for back pain and gait problem.  Skin: Negative for pallor and rash.  Neurological: Negative for dizziness, tremors, weakness, numbness and headaches.  Psychiatric/Behavioral: Negative for suicidal ideas,  confusion and sleep disturbance. The patient is nervous/anxious.     Objective:  BP 160/100 mmHg  Pulse 58  Wt 201 lb (91.173 kg)  SpO2 97%  BP Readings from Last 3 Encounters:  10/29/14 160/100  07/27/14 144/80  01/08/14 130/88    Wt Readings from Last 3 Encounters:  10/29/14 201 lb (91.173 kg)  07/27/14 203 lb (92.08 kg)  01/08/14 204 lb (92.534 kg)    Physical Exam  Constitutional: She appears well-developed. No distress.  HENT:  Head: Normocephalic.  Right Ear: External ear normal.  Left Ear: External ear normal.  Nose: Nose normal.  Mouth/Throat: Oropharynx is clear and moist.  Eyes: Conjunctivae are normal. Pupils are equal, round, and reactive to light. Right eye exhibits no discharge. Left eye exhibits no discharge.  Neck: Normal range of motion. Neck supple. No JVD present. No tracheal deviation present. No thyromegaly present.  Cardiovascular: Normal rate, regular rhythm and normal heart sounds.   Pulmonary/Chest: No stridor. No respiratory distress. She has no wheezes.  Abdominal: Soft. Bowel sounds are normal. She exhibits no distension and no mass. There is no tenderness. There is no rebound and no guarding.  Musculoskeletal: She exhibits no edema or tenderness.  Lymphadenopathy:    She has no cervical adenopathy.  Neurological: She displays normal reflexes. No cranial nerve deficit. She exhibits normal muscle tone. Coordination normal.  Skin: No rash noted. No erythema.  Psychiatric: Her behavior is normal. Judgment and thought content normal.  Obese  Lab Results  Component Value Date   WBC 10.6* 07/27/2014  HGB 14.9 07/27/2014   HCT 43.0 07/27/2014   PLT 281.0 07/27/2014   GLUCOSE 98 07/27/2014   CHOL 251* 07/27/2014   TRIG 138.0 07/27/2014   HDL 52.20 07/27/2014   LDLDIRECT 177.1 09/20/2012   LDLCALC 171* 07/27/2014   ALT 17 07/27/2014   AST 15 07/27/2014   NA 136 07/27/2014   K 3.8 07/27/2014   CL 100 07/27/2014   CREATININE 0.76 07/27/2014    BUN 16 07/27/2014   CO2 31 07/27/2014   TSH 1.72 07/27/2014    Mm Digital Screening Bilateral  07/12/2013   CLINICAL DATA:  Screening.  EXAM: DIGITAL SCREENING BILATERAL MAMMOGRAM WITH CAD  COMPARISON:  Previous exam(s).  ACR Breast Density Category b: There are scattered areas of fibroglandular density.  FINDINGS: There are no findings suspicious for malignancy. Images were processed with CAD.  IMPRESSION: No mammographic evidence of malignancy. A result letter of this screening mammogram will be mailed directly to the patient.  RECOMMENDATION: Screening mammogram in one year. (Code:SM-B-01Y)  BI-RADS CATEGORY  1: Negative.   Electronically Signed   By: Skipper Cliche M.D.   On: 07/12/2013 16:44    Assessment & Plan:   Diagnoses and all orders for this visit:  Essential hypertension  Dyslipidemia  Obesity (BMI 30-39.9)  Other constipation  Osteoarthrosis, unspecified whether generalized or localized, involving lower leg  Rectus diastasis   I have discontinued Ms. Paquette ciprofloxacin. I am also having her maintain her cholecalciferol, aspirin EC, MEGARED OMEGA-3 KRILL OIL, lubiprostone, ergocalciferol, BENICAR HCT, amLODipine, and meloxicam.  Meds ordered this encounter  Medications  . meloxicam (MOBIC) 15 MG tablet    Sig: Take 15 mg by mouth daily as needed.    Refill:  2     Follow-up: Return in about 4 months (around 02/28/2015) for a follow-up visit.  Walker Kehr, MD

## 2014-10-29 NOTE — Assessment & Plan Note (Signed)
Olmesartan-HCT, Amlodipine

## 2014-10-29 NOTE — Assessment & Plan Note (Signed)
Wt Readings from Last 3 Encounters:  10/29/14 201 lb (91.173 kg)  07/27/14 203 lb (92.08 kg)  01/08/14 204 lb (92.534 kg)

## 2014-10-29 NOTE — Assessment & Plan Note (Addendum)
Pt tried Amitiza - using prn

## 2014-10-29 NOTE — Assessment & Plan Note (Signed)
Krill oil 

## 2014-10-29 NOTE — Progress Notes (Signed)
Pre visit review using our clinic review tool, if applicable. No additional management support is needed unless otherwise documented below in the visit note. 

## 2014-10-29 NOTE — Assessment & Plan Note (Signed)
Chronic Meloxicam prn

## 2015-02-14 ENCOUNTER — Telehealth: Payer: Self-pay | Admitting: Internal Medicine

## 2015-02-14 NOTE — Telephone Encounter (Signed)
Would like a call back to talk about father passing away

## 2015-02-18 NOTE — Telephone Encounter (Addendum)
Spoke w/[place]

## 2015-02-26 ENCOUNTER — Ambulatory Visit: Payer: Managed Care, Other (non HMO) | Admitting: Internal Medicine

## 2015-02-26 ENCOUNTER — Other Ambulatory Visit: Payer: Self-pay | Admitting: Internal Medicine

## 2015-02-28 ENCOUNTER — Ambulatory Visit: Payer: Managed Care, Other (non HMO) | Admitting: Internal Medicine

## 2015-04-09 ENCOUNTER — Other Ambulatory Visit (INDEPENDENT_AMBULATORY_CARE_PROVIDER_SITE_OTHER): Payer: Managed Care, Other (non HMO)

## 2015-04-09 ENCOUNTER — Telehealth: Payer: Self-pay | Admitting: Internal Medicine

## 2015-04-09 ENCOUNTER — Ambulatory Visit: Payer: Managed Care, Other (non HMO) | Admitting: Internal Medicine

## 2015-04-09 DIAGNOSIS — R300 Dysuria: Secondary | ICD-10-CM | POA: Diagnosis not present

## 2015-04-09 LAB — URINALYSIS, ROUTINE W REFLEX MICROSCOPIC
BILIRUBIN URINE: NEGATIVE
KETONES UR: NEGATIVE
Leukocytes, UA: NEGATIVE
Nitrite: NEGATIVE
Specific Gravity, Urine: <=1.005 — AB (ref 1.000–1.030)
TOTAL PROTEIN, URINE-UPE24: NEGATIVE
Urine Glucose: NEGATIVE
Urobilinogen, UA: 0.2 (ref 0.0–1.0)
WBC, UA: NONE SEEN (ref 0–?)
pH: 5.5 (ref 5.0–8.0)

## 2015-04-09 NOTE — Telephone Encounter (Signed)
Patient called to advise that she is going to be on this side of town this morning, and was hoping to come by the lab and drop off a urine sample. She be.liecves that she has a UTI

## 2015-04-09 NOTE — Telephone Encounter (Signed)
UA order placed. Pt informed. Pt had a dental emergency and has to go to the dentist today.  She is going to reschedule with PCP.

## 2015-05-14 ENCOUNTER — Other Ambulatory Visit: Payer: Self-pay

## 2015-05-14 DIAGNOSIS — Z1231 Encounter for screening mammogram for malignant neoplasm of breast: Secondary | ICD-10-CM

## 2015-05-16 ENCOUNTER — Ambulatory Visit: Payer: Managed Care, Other (non HMO)

## 2015-06-03 ENCOUNTER — Ambulatory Visit (INDEPENDENT_AMBULATORY_CARE_PROVIDER_SITE_OTHER): Payer: Managed Care, Other (non HMO) | Admitting: Internal Medicine

## 2015-06-03 ENCOUNTER — Encounter: Payer: Self-pay | Admitting: Internal Medicine

## 2015-06-03 VITALS — BP 160/100 | HR 64 | Wt 203.0 lb

## 2015-06-03 DIAGNOSIS — I10 Essential (primary) hypertension: Secondary | ICD-10-CM

## 2015-06-03 DIAGNOSIS — E785 Hyperlipidemia, unspecified: Secondary | ICD-10-CM

## 2015-06-03 DIAGNOSIS — K5909 Other constipation: Secondary | ICD-10-CM | POA: Diagnosis not present

## 2015-06-03 MED ORDER — TRIAMTERENE-HCTZ 37.5-25 MG PO TABS: 1.0000 | ORAL_TABLET | Freq: Every day | ORAL | Status: DC

## 2015-06-03 MED ORDER — LOSARTAN POTASSIUM 100 MG PO TABS: 100.0000 mg | ORAL_TABLET | Freq: Every day | ORAL | Status: DC

## 2015-06-03 NOTE — Progress Notes (Signed)
Subjective:  Patient ID: Bailey [name], female    DOB: 12/16/1950  Age: 65 y.o. MRN: [id number]  CC: No chief complaint on file.   HPI Bailey [name] presents for HTN, constipation, stress f/u. C/o Benicar cost  Outpatient Prescriptions Prior to Visit  Medication Sig Dispense Refill  . amLODipine (NORVASC) 2.5 MG tablet Take 1 tablet (2.5 mg total) by mouth daily. 90 tablet 3  . aspirin EC 81 MG tablet Take 1 tablet (81 mg total) by mouth daily. 100 tablet 3  . lubiprostone (AMITIZA) 24 MCG capsule Take 1 capsule (24 mcg total) by mouth 2 (two) times daily with a meal. 60 capsule 5  . MEGARED OMEGA-3 KRILL OIL 500 MG CAPS Take 1 capsule by mouth every morning. 100 capsule 3  . meloxicam (MOBIC) 15 MG tablet Take 15 mg by mouth daily as needed.  2  . olmesartan-hydrochlorothiazide (BENICAR HCT) 20-12.5 MG tablet TAKE 1 TABLET BY MOUTH DAILY 90 tablet 0   No facility-administered medications prior to visit.    ROS Review of Systems  Constitutional: Positive for fatigue. Negative for chills, activity change, appetite change and unexpected weight change.  HENT: Negative for congestion, mouth sores and sinus pressure.   Eyes: Negative for visual disturbance.  Respiratory: Negative for cough and chest tightness.   Gastrointestinal: Negative for nausea, abdominal pain and constipation.  Genitourinary: Negative for frequency, difficulty urinating and vaginal pain.  Musculoskeletal: Negative for back pain and gait problem.  Skin: Negative for pallor and rash.  Neurological: Negative for dizziness, tremors, weakness, numbness and headaches.  Psychiatric/Behavioral: Positive for sleep disturbance. Negative for suicidal ideas and confusion. The patient is nervous/anxious.     Objective:  BP 160/100 mmHg  Pulse 64  Wt 203 lb (92.08 kg)  SpO2 98%  BP Readings from Last 3 Encounters:  06/03/15 160/100  10/29/14 139/80  07/27/14 144/80    Wt Readings from Last 3 Encounters:    06/03/15 203 lb (92.08 kg)  10/29/14 201 lb (91.173 kg)  07/27/14 203 lb (92.08 kg)    Physical Exam  Constitutional: She appears well-developed. No distress.  HENT:  Head: Normocephalic.  Right Ear: External ear normal.  Left Ear: External ear normal.  Nose: Nose normal.  Mouth/Throat: Oropharynx is clear and moist.  Eyes: Conjunctivae are normal. Pupils are equal, round, and reactive to light. Right eye exhibits no discharge. Left eye exhibits no discharge.  Neck: Normal range of motion. Neck supple. No JVD present. No tracheal deviation present. No thyromegaly present.  Cardiovascular: Normal rate, regular rhythm and normal heart sounds.   Pulmonary/Chest: No stridor. No respiratory distress. She has no wheezes.  Abdominal: Soft. Bowel sounds are normal. She exhibits no distension and no mass. There is no tenderness. There is no rebound and no guarding.  Musculoskeletal: She exhibits no edema or tenderness.  Lymphadenopathy:    She has no cervical adenopathy.  Neurological: She displays normal reflexes. No cranial nerve deficit. She exhibits normal muscle tone. Coordination normal.  Skin: No rash noted. No erythema.  Psychiatric: She has a normal mood and affect. Her behavior is normal. Judgment and thought content normal.  Obese  Lab Results  Component Value Date   WBC 10.6* 07/27/2014   HGB 14.9 07/27/2014   HCT 43.0 07/27/2014   PLT 281.0 07/27/2014   GLUCOSE 98 07/27/2014   CHOL 251* 07/27/2014   TRIG 138.0 07/27/2014   HDL 52.20 07/27/2014   LDLDIRECT 177.1 09/20/2012   LDLCALC 171* 07/27/2014  ALT 17 07/27/2014   AST 15 07/27/2014   NA 136 07/27/2014   K 3.8 07/27/2014   CL 100 07/27/2014   CREATININE 0.76 07/27/2014   BUN 16 07/27/2014   CO2 31 07/27/2014   TSH 1.72 07/27/2014    Mm Digital Screening Bilateral  07/12/2013  CLINICAL DATA:  Screening. EXAM: DIGITAL SCREENING BILATERAL MAMMOGRAM WITH CAD COMPARISON:  Previous exam(s). ACR Breast Density  Category b: There are scattered areas of fibroglandular density. FINDINGS: There are no findings suspicious for malignancy. Images were processed with CAD. IMPRESSION: No mammographic evidence of malignancy. A result letter of this screening mammogram will be mailed directly to the patient. RECOMMENDATION: Screening mammogram in one year. (Code:SM-B-01Y) BI-RADS CATEGORY  1: Negative. Electronically Signed   By: Skipper Cliche M.D.   On: 07/12/2013 16:44    Assessment & Plan:   There are no diagnoses linked to this encounter. I am having Bailey [name] maintain her aspirin EC, MEGARED OMEGA-3 KRILL OIL, lubiprostone, amLODipine, meloxicam, and olmesartan-hydrochlorothiazide.  No orders of the defined types were placed in this encounter.     Follow-up: No Follow-up on file.  Walker Kehr, MD

## 2015-06-03 NOTE — Addendum Note (Signed)
Addended by: Cassandria Anger on: 06/03/2015 10:01 AM   Modules accepted: Orders

## 2015-06-03 NOTE — Assessment & Plan Note (Addendum)
Olmesartan-HCT - d/c'd 4/17, Amlodipine Maxzide Losartan

## 2015-06-03 NOTE — Progress Notes (Signed)
Pre visit review using our clinic review tool, if applicable. No additional management support is needed unless otherwise documented below in the visit note. 

## 2015-06-03 NOTE — Assessment & Plan Note (Signed)
On a low fat diet 

## 2015-06-19 ENCOUNTER — Other Ambulatory Visit (INDEPENDENT_AMBULATORY_CARE_PROVIDER_SITE_OTHER): Payer: Managed Care, Other (non HMO)

## 2015-06-19 ENCOUNTER — Telehealth: Payer: Self-pay | Admitting: Internal Medicine

## 2015-06-19 DIAGNOSIS — R300 Dysuria: Secondary | ICD-10-CM | POA: Diagnosis not present

## 2015-06-19 LAB — URINALYSIS, ROUTINE W REFLEX MICROSCOPIC
Bilirubin Urine: NEGATIVE
Ketones, ur: NEGATIVE
Nitrite: POSITIVE — AB
Total Protein, Urine: NEGATIVE
UROBILINOGEN UA: 0.2 (ref 0.0–1.0)
Urine Glucose: NEGATIVE
pH: 6.5 (ref 5.0–8.0)

## 2015-06-19 MED ORDER — CIPROFLOXACIN HCL 250 MG PO TABS: 250.0000 mg | ORAL_TABLET | Freq: Two times a day (BID) | ORAL | Status: DC

## 2015-06-19 NOTE — Telephone Encounter (Signed)
UA ordered. Pt informed by Tammy P., scheduler.

## 2015-06-19 NOTE — Telephone Encounter (Signed)
Patient states she thinks she has a UTI.  She would like to know if she can go to the lab to give a specimen.  States she has used AZO but that has not relieved the symptoms of burning and frequent urination.

## 2015-07-26 ENCOUNTER — Ambulatory Visit: Payer: Managed Care, Other (non HMO) | Admitting: Internal Medicine

## 2015-08-14 DIAGNOSIS — I10 Essential (primary) hypertension: Secondary | ICD-10-CM | POA: Diagnosis not present

## 2015-08-14 DIAGNOSIS — Z0000 Encounter for general adult medical examination without abnormal findings: Secondary | ICD-10-CM | POA: Diagnosis not present

## 2015-08-14 DIAGNOSIS — E785 Hyperlipidemia, unspecified: Secondary | ICD-10-CM | POA: Diagnosis not present

## 2015-08-15 DIAGNOSIS — L579 Skin changes due to chronic exposure to nonionizing radiation, unspecified: Secondary | ICD-10-CM | POA: Diagnosis not present

## 2015-08-15 DIAGNOSIS — L659 Nonscarring hair loss, unspecified: Secondary | ICD-10-CM | POA: Diagnosis not present

## 2015-08-27 ENCOUNTER — Ambulatory Visit: Payer: Managed Care, Other (non HMO) | Admitting: Internal Medicine

## 2015-08-27 DIAGNOSIS — Z780 Asymptomatic menopausal state: Secondary | ICD-10-CM | POA: Diagnosis not present

## 2015-09-25 DIAGNOSIS — L659 Nonscarring hair loss, unspecified: Secondary | ICD-10-CM | POA: Diagnosis not present

## 2015-10-01 DIAGNOSIS — I10 Essential (primary) hypertension: Secondary | ICD-10-CM | POA: Diagnosis not present

## 2015-11-13 DIAGNOSIS — I10 Essential (primary) hypertension: Secondary | ICD-10-CM | POA: Diagnosis not present

## 2015-12-04 DIAGNOSIS — E782 Mixed hyperlipidemia: Secondary | ICD-10-CM | POA: Diagnosis not present

## 2015-12-04 DIAGNOSIS — I10 Essential (primary) hypertension: Secondary | ICD-10-CM | POA: Diagnosis not present

## 2015-12-04 LAB — BASIC METABOLIC PANEL
BUN: 14 mg/dL (ref 4–21)
CREATININE: 0.7 mg/dL (ref <=1.1)
GLUCOSE: 104 mg/dL
POTASSIUM: 4.9 mmol/L (ref 3.4–5.3)
SODIUM: 139 mmol/L (ref 137–147)

## 2015-12-04 LAB — CBC AND DIFFERENTIAL
HCT: 41 % (ref 36–46)
Hemoglobin: 14.3 g/dL (ref 12.0–16.0)
Platelets: 213 10*3/uL (ref 150–399)
WBC: 6.8 10*3/mL

## 2015-12-04 LAB — HEPATIC FUNCTION PANEL
ALT: 19 U/L (ref 7–35)
AST: 19 U/L (ref 13–35)
Alkaline Phosphatase: 85 U/L (ref 25–125)
BILIRUBIN, TOTAL: 0.4 mg/dL

## 2015-12-04 LAB — VITAMIN D 25 HYDROXY (VIT D DEFICIENCY, FRACTURES): Vit D, 1,25-Dihydroxy: 24.4

## 2015-12-04 LAB — LIPID PANEL
Cholesterol: 203 mg/dL — AB (ref 0–200)
HDL: 46 mg/dL (ref 35–70)
LDL CALC: 132 mg/dL
TRIGLYCERIDES: 125 mg/dL (ref 40–160)

## 2015-12-04 LAB — PTH, INTACT: PTH INTERP: 10.3

## 2015-12-05 ENCOUNTER — Encounter: Payer: Self-pay | Admitting: Family Medicine

## 2015-12-05 ENCOUNTER — Ambulatory Visit (INDEPENDENT_AMBULATORY_CARE_PROVIDER_SITE_OTHER): Payer: PPO | Admitting: Family Medicine

## 2015-12-05 VITALS — BP 139/83 | HR 55 | Ht 66.0 in | Wt 200.3 lb

## 2015-12-05 DIAGNOSIS — M159 Polyosteoarthritis, unspecified: Secondary | ICD-10-CM

## 2015-12-05 DIAGNOSIS — E785 Hyperlipidemia, unspecified: Secondary | ICD-10-CM

## 2015-12-05 DIAGNOSIS — Z9289 Personal history of other medical treatment: Secondary | ICD-10-CM

## 2015-12-05 DIAGNOSIS — E669 Obesity, unspecified: Secondary | ICD-10-CM

## 2015-12-05 DIAGNOSIS — I10 Essential (primary) hypertension: Secondary | ICD-10-CM

## 2015-12-05 DIAGNOSIS — K5904 Chronic idiopathic constipation: Secondary | ICD-10-CM

## 2015-12-05 DIAGNOSIS — E559 Vitamin D deficiency, unspecified: Secondary | ICD-10-CM

## 2015-12-05 NOTE — Progress Notes (Signed)
New patient office visit note:  Impression and Recommendations:    1. Essential hypertension   2. Dyslipidemia   3. Obesity (BMI 32)   4. Generalized OA   5. Hx of cardiovascular stress test   6. Vitamin D deficiency   7. Chronic idiopathic constipation    Please get [place] records from any providers/ specialists who are not on the Epic medical record system or not part of [place].  I reviewed patient's recent labs that were drawn on 10\4\17.   Dyslipidemia Is on Zetia, is unable to tolerate statin  Diet & lifestyle modifications discussed  Constipation Using amitiza prn  Essential hypertension Blood pressure at goal for age. Continue current medications.    Monitor at home, if blood pressure not at goal of 150/90 or less, return to clinic sooner than planned.  Potassium was 4.9 after 3 weeks on Lasix, and rest of BMP within normal limits.  Obesity (BMI 30-39.9) Explained to patient what BMI refers to, and what it means medically.    Told patient to think about it as a "medical risk stratification measurement" and how increasing BMI is associated with increasing risk/ or worsening state of various diseases such as hypertension, hyperlipidemia, diabetes, premature OA, depression etc.  American Heart Association guidelines for healthy diet, basically Mediterranean diet, and exercise guidelines of 30 minutes 5 days per week or more discussed in detail.  Health counseling performed.  All questions answered.  Generalized OA Sees ortho for various jt pains; on meloxicam  Vitamin D deficiency Continue supplements. Will need recheck approximate 4-6 months   New Prescriptions   No medications on file    Modified Medications   No medications on file    Please note, I did not discontinue any of these medicines today. This is what patient reported taking and her med list was updated accordingly. Discontinued Medications   AMLODIPINE (NORVASC) 2.5 MG TABLET     Take 1 tablet (2.5 mg total) by mouth daily.   CIPROFLOXACIN (CIPRO) 250 MG TABLET    Take 1 tablet (250 mg total) by mouth 2 (two) times daily.   LOSARTAN (COZAAR) 100 MG TABLET    Take 1 tablet (100 mg total) by mouth daily.   LUBIPROSTONE (AMITIZA) 24 MCG CAPSULE    Take 1 capsule (24 mcg total) by mouth 2 (two) times daily with a meal.   MEGARED OMEGA-3 KRILL OIL 500 MG CAPS    Take 1 capsule by mouth every morning.   TRIAMTERENE-HYDROCHLOROTHIAZIDE (MAXZIDE-25) 37.5-25 MG TABLET    Take 1 tablet by mouth daily.    Return for Follow-up 4-6 months chronic medical issues. reck vit D, bmp  The patient was counseled, risk factors were discussed, anticipatory guidance given.   Please see AVS handed out to patient at the end of our visit for further patient instructions/ counseling done pertaining to today's office visit.    Note: This document was prepared using Dragon voice recognition software and may include unintentional dictation errors.  ----------------------------------------------------------------------------------------------------------------------    Subjective:    Chief Complaint  Patient presents with  . Establish Care    HPI: Bailey [name] is a pleasant 65 y.o. female who presents to [place] [facility] today to review their medical history with me and establish care.   I asked the patient to review their chronic problem list with me to ensure everything was updated and accurate.    Why patient is  switching PCP's: Patient has seen Miss Melynda Ripple, [place] from 07/01/2015 and the last office visit was on 10\4\17- yesterday.   However, she is out of network.    Prior to that she was seen by [facility] internal medicine Dr. Alain Marion- for many years.  Pt wanting to be closer to home and [place] farther away.  HTN - since 1995; PA-C put her on lasix meds last 3 wks or so "for fluid "- although patient denied any fluid in her bilateral  legs etc.  Pt halving the med--10 mg daily. She did have a follow-up with Ms. Janeann Merl office for blood work several times within the past couple months and the latest was yesterday per patient.   Hyperlipidemia> recent onset.  Had cramps and bad muscle aches with Lipitor and other meds started on in past.  Started Zetia 2 months now.   No side effects  OA- was on mobic by Dr Gladstone Lighter and Dr. Mardelle Matte in past- only couple weeks for foot pain.  No gen arthritis/ other orthopaedic problems.  Hair thinning with Benicar--> sees Dr Allyson Sabal- had injections in scalp for this.  Takes Biotin for this now.     Obesity- most of her life.   No diets really. Cooks for her husband who likes certain foods, and patient states she just eats that food which isn't healthiest.   Health maintenance issues: Due for CPE-  not sure when her last complete physical was.  Has GYN- [facility]- is up-to-date on mammogram and Pap smears etc.  Colonoscopy - 2 yrs.  was normal-  Dr Fuller Plan- GI    (( This was pt's medication list which was current as of April 2017 when she last saw her [facility] PCP- then saw Ms Loyal Buba of [facility] couple times  Outpatient Prescriptions Prior to Visit  Medication Sig Dispense Refill  . amlodipine (NORVASC) 2.5 MG tablet Take 1 tablet (2.5 mg total) by mouth daily. 90 tablet 3  . aspirin EC 81 MG tablet Take 1 tablet (81 mg total) by mouth daily. 100 tablet 3  . lubiprostone (AMITIZA) 24 MCG capsule Take 1 capsule (24 mcg total) by mouth 2 (two) times daily with a meal. 60 capsule 5  . MEGA RED OMEGA-3 KRILL OIL 500 MG CAPS Take 1 capsule by mouth every morning. 100 capsule 3  . meloxicam (MOBIC) 15 MG tablet Take 15 mg by mouth daily as needed.  2  . polestar-hydrochlorothiazide (ENCARE HCT) 20-12.5 MG tablet TAKE 1 TABLET BY MOUTH DAILY 90 tablet 0   ))      Wt Readings from Last 3 Encounters:  12/05/15 200 lb 4.8 oz (90.9 kg)  06/03/15 203 lb (92.1 kg)  10/29/14 201 lb  (91.2 kg)   BP Readings from Last 3 Encounters:  12/05/15 139/83  06/03/15 (!) 160/100  10/29/14 139/80   Pulse Readings from Last 3 Encounters:  12/05/15 (!) 55  06/03/15 64  10/29/14 (!) 58   BMI Readings from Last 3 Encounters:  12/05/15 32.33 kg/m  06/03/15 32.77 kg/m  10/29/14 32.44 kg/m    Patient Active Problem List   Diagnosis Date Noted  . Hx of cardiovascular stress test 12/22/2015  . Vitamin D deficiency 12/22/2015  . Generalized OA 10/29/2014  . Fatigue 07/27/2014  . Constipation 07/27/2014  . Rectus diastasis 08/09/2013  . Obesity (BMI 30-39.9) 06/22/2013  . Gallstone 07/27/2012  . Acute diverticulitis 07/27/2012  . Partial tear of subscapularis tendon 04/22/2012  .  Supraspinatus tendon tear 04/22/2012  . Trochanteric bursitis of right hip   . Asymptomatic postmenopausal status 01/04/2009  . Dyslipidemia 09/27/2008  . Essential hypertension 06/28/2008     Past Medical History:  Diagnosis Date  . Arthritis   . Colitis    hx colitis-gi bleed-2006  . DYSLIPIDEMIA   . Hx of cardiovascular stress test    Lexiscan Myoview 9/14:  Small, fixed apical anterior perfusion defect (worse at rest) - probable soft tissue attenuation, EF 61%, low risk study  . HYPERTENSION   . Partial tear of subscapularis tendon 04/22/2012  . POSTMENOPAUSAL STATUS   . Supraspinatus tendon tear 04/22/2012     Past Surgical History:  Procedure Laterality Date  . ABDOMINAL HYSTERECTOMY  1987   Partial  . APPENDECTOMY  2003  . CERVICAL FUSION  2002  . COLONOSCOPY  06/03/04   isch colitis (hosp for same)  . HERNIA REPAIR  1975   umb  . SHOULDER ARTHROSCOPY WITH ROTATOR CUFF REPAIR AND SUBACROMIAL DECOMPRESSION Right 04/22/2012   Procedure: SHOULDER ARTHROSCOPY WITH ROTATOR CUFF REPAIR AND SUBACROMIAL DECOMPRESSION;  Surgeon: Johnny Bridge, MD;  Location: [facility];  Service: Orthopedics;  Laterality: Right;  RIGHT SHOULDER ARTHROSCOPY DEBRIDEMENT LIMITED,  DECOMPRESSION SUBACROMIAL PARTIAL ACROMIOPLASTY WITH CORACOACROMIAL RELEASE, WITH ROTATOR CUFF REPAIR AND SUBSCAPULARIS REPAIR  . TUBAL LIGATION       Family History  Problem Relation Age of Onset  . Hypertension Father   . Stroke Father   . Aneurysm Mother     brain  . Stroke Sister   . Hypertension Sister   . Diabetes Brother      History  Drug Use No    History  Alcohol Use No    History  Smoking Status  . Never Smoker  Smokeless Tobacco  . Never Used    Comment: exposed to second hand (spouse smokes), Married    Patient's Medications  New Prescriptions   No medications on file  Previous Medications   ASPIRIN EC 81 MG TABLET    Take 1 tablet (81 mg total) by mouth daily.   BIOTIN 1 MG CAPS    Take 1 capsule by mouth daily.   CHOLECALCIFEROL (VITAMIN D3) 2000 UNITS TABS    Take 1 tablet by mouth daily.   EZETIMIBE (ZETIA) 10 MG TABLET    Take 1 tablet by mouth daily.   FUROSEMIDE (LASIX) 20 MG TABLET    Take 10 mg by mouth daily.   MELOXICAM (MOBIC) 15 MG TABLET    Take 15 mg by mouth daily as needed.   OLMESARTAN (BENICAR) 40 MG TABLET    Take 1 tablet by mouth daily.  Modified Medications   No medications on file  Discontinued Medications   AMLODIPINE (NORVASC) 2.5 MG TABLET    Take 1 tablet (2.5 mg total) by mouth daily.   CIPROFLOXACIN (CIPRO) 250 MG TABLET    Take 1 tablet (250 mg total) by mouth 2 (two) times daily.   LOSARTAN (COZAAR) 100 MG TABLET    Take 1 tablet (100 mg total) by mouth daily.   LUBIPROSTONE (AMITIZA) 24 MCG CAPSULE    Take 1 capsule (24 mcg total) by mouth 2 (two) times daily with a meal.   MEGARED OMEGA-3 KRILL OIL 500 MG CAPS    Take 1 capsule by mouth every morning.   TRIAMTERENE-HYDROCHLOROTHIAZIDE (MAXZIDE-25) 37.5-25 MG TABLET    Take 1 tablet by mouth daily.    Allergies: Codeine; Statins; and Benicar [olmesartan]  Review of Systems  Constitutional: Negative.  Negative for chills, diaphoresis, fever, malaise/fatigue and  weight loss.  HENT: Negative.  Negative for congestion, sore throat and tinnitus.   Eyes: Negative.  Negative for blurred vision, double vision and photophobia.  Respiratory: Negative.  Negative for cough and wheezing.   Cardiovascular: Negative.  Negative for chest pain and palpitations.  Gastrointestinal: Negative.  Negative for blood in stool, diarrhea, nausea and vomiting.  Genitourinary: Negative.  Negative for dysuria, frequency and urgency.  Musculoskeletal: Negative.  Negative for joint pain and myalgias.  Skin: Negative.  Negative for itching and rash.  Neurological: Negative.  Negative for dizziness, focal weakness, weakness and headaches.  Endo/Heme/Allergies: Negative.  Negative for environmental allergies and polydipsia. Does not bruise/bleed easily.  Psychiatric/Behavioral: Negative.  Negative for depression and memory loss. The patient is not nervous/anxious and does not have insomnia.      Objective:   Blood pressure 139/83, pulse (!) 55, height 5\' 6"  (1.676 m), weight 200 lb 4.8 oz (90.9 kg). Body mass index is 32.33 kg/m. General: Well Developed, well nourished, and in no acute distress.  Neuro: Alert and oriented x3, extra-ocular muscles intact, sensation grossly intact.  HEENT: Normocephalic, atraumatic, pupils equal round reactive to light, neck supple Skin: no gross suspicious lesions or rashes  Cardiac: Regular rate and rhythm, no murmurs rubs or gallops.  Respiratory: Essentially clear to auscultation bilaterally. Not using accessory muscles, speaking in full sentences.  Abdominal: Soft, not grossly distended Musculoskeletal: Ambulates w/o diff, FROM * 4 ext.  Vasc: less 2 sec cap RF, warm and pink  Psych:  No HI/SI, judgement and insight good, Euthymic mood. Full Affect.

## 2015-12-05 NOTE — Patient Instructions (Addendum)
Please get [place] records from any providers who are not on the Epic medical record system or not part of [place].  Please monitor your blood pressure at home\ at pharmacies and keep a log. If blood pressure is not at goal, return to clinic sooner than planned.

## 2015-12-22 DIAGNOSIS — Z9289 Personal history of other medical treatment: Secondary | ICD-10-CM | POA: Insufficient documentation

## 2015-12-22 DIAGNOSIS — E559 Vitamin D deficiency, unspecified: Secondary | ICD-10-CM | POA: Insufficient documentation

## 2015-12-22 NOTE — Assessment & Plan Note (Signed)

## 2015-12-22 NOTE — Assessment & Plan Note (Addendum)
Is on Zetia, is unable to tolerate statin  Diet & lifestyle modifications discussed 

## 2015-12-22 NOTE — Assessment & Plan Note (Signed)
Continue supplements. Will need recheck approximate 4-6 months

## 2015-12-22 NOTE — Assessment & Plan Note (Addendum)
Blood pressure at goal for age. Continue current medications.    Monitor at home, if blood pressure not at goal of 150/90 or less, return to clinic sooner than planned.  Potassium was 4.9 after 3 weeks on Lasix, and rest of BMP within normal limits.

## 2015-12-22 NOTE — Assessment & Plan Note (Signed)
Sees ortho for various jt pains; on meloxicam

## 2015-12-22 NOTE — Assessment & Plan Note (Signed)
Using amitiza prn

## 2016-02-04 DIAGNOSIS — Z5181 Encounter for therapeutic drug level monitoring: Secondary | ICD-10-CM | POA: Diagnosis not present

## 2016-02-04 DIAGNOSIS — L661 Lichen planopilaris: Secondary | ICD-10-CM | POA: Diagnosis not present

## 2016-02-04 DIAGNOSIS — L658 Other specified nonscarring hair loss: Secondary | ICD-10-CM | POA: Diagnosis not present

## 2016-02-04 DIAGNOSIS — Z79899 Other long term (current) drug therapy: Secondary | ICD-10-CM | POA: Diagnosis not present

## 2016-02-04 DIAGNOSIS — Z7982 Long term (current) use of aspirin: Secondary | ICD-10-CM | POA: Diagnosis not present

## 2016-03-13 ENCOUNTER — Ambulatory Visit (INDEPENDENT_AMBULATORY_CARE_PROVIDER_SITE_OTHER): Payer: PPO | Admitting: Family Medicine

## 2016-03-13 ENCOUNTER — Encounter: Payer: Self-pay | Admitting: Family Medicine

## 2016-03-13 VITALS — BP 143/88 | HR 61 | Temp 98.3°F | Resp 17 | Wt 199.0 lb

## 2016-03-13 DIAGNOSIS — R102 Pelvic and perineal pain: Secondary | ICD-10-CM | POA: Insufficient documentation

## 2016-03-13 DIAGNOSIS — R319 Hematuria, unspecified: Secondary | ICD-10-CM | POA: Diagnosis not present

## 2016-03-13 LAB — POCT URINALYSIS DIPSTICK
BILIRUBIN UA: NEGATIVE
Glucose, UA: NEGATIVE
Ketones, UA: NEGATIVE
Leukocytes, UA: NEGATIVE
NITRITE UA: POSITIVE
PH UA: 6.5
Protein, UA: 30
Spec Grav, UA: <=1.005
UROBILINOGEN UA: 0.2

## 2016-03-13 MED ORDER — PHENAZOPYRIDINE HCL 200 MG PO TABS: 200.0000 mg | ORAL_TABLET | Freq: Three times a day (TID) | ORAL | 0 refills | Status: AC

## 2016-03-13 MED ORDER — NITROFURANTOIN MONOHYD MACRO 100 MG PO CAPS: 100.0000 mg | ORAL_CAPSULE | Freq: Two times a day (BID) | ORAL | 0 refills | Status: DC

## 2016-03-13 NOTE — Progress Notes (Signed)
Subjective:    HPI: Bailey [name] is a 66 y.o. female who presents to [facility] today for c/o dysuria.  Sx for 4 days.  Pressure in lower abd, and even lower buttocks b/l and then radiates to from inguinal canals and groin.    Pt took AZO * 2 days  Along with tylenol.   Small Bm's this week--> usually stays constipated.   Going more at night> not actute but going on for months.    C/O: NO dysuria NO increased frequency NO increased urgency n malodorous urination n Nausea n   Fever/ Chills y Back Pain n  vaginal/penile discharge n  prior h/o STI Monogamous currently; partner w/o STI n ABX usage past 30 d  Urinalysis    Component Value Date/Time   COLORURINE YELLOW 06/19/2015 1154   APPEARANCEUR Cloudy (A) 06/19/2015 1154   LABSPEC <=1.005 (A) 06/19/2015 1154   PHURINE 6.5 06/19/2015 1154   GLUCOSEU NEGATIVE 06/19/2015 1154   HGBUR SMALL (A) 06/19/2015 1154   HGBUR large 10/01/2009 1437   BILIRUBINUR negative 03/13/2016 1127   KETONESUR NEGATIVE 06/19/2015 1154   PROTEINUR 30 03/13/2016 1127   UROBILINOGEN 0.2 03/13/2016 1127   UROBILINOGEN 0.2 06/19/2015 1154   NITRITE positive 03/13/2016 1127   NITRITE POSITIVE (A) 06/19/2015 1154   LEUKOCYTESUR Negative 03/13/2016 1127    Wt Readings from Last 3 Encounters:  03/13/16 199 lb (90.3 kg)  12/05/15 200 lb 4.8 oz (90.9 kg)  06/03/15 203 lb (92.1 kg)   BP Readings from Last 3 Encounters:  03/13/16 (!) 143/88  12/05/15 139/83  06/03/15 (!) 160/100   Pulse Readings from Last 3 Encounters:  03/13/16 61  12/05/15 (!) 55  06/03/15 64   BMI Readings from Last 3 Encounters:  03/13/16 32.12 kg/m  12/05/15 32.33 kg/m  06/03/15 32.77 kg/m     Patient Active Problem List   Diagnosis Date Noted  . Hematuria 03/13/2016  . Pelvic pressure in female 03/13/2016  . Hx of cardiovascular stress test 12/22/2015  . Vitamin D deficiency 12/22/2015  . Generalized OA 10/29/2014  .  Fatigue 07/27/2014  . Constipation 07/27/2014  . Rectus diastasis 08/09/2013  . Obesity (BMI 30-39.9) 06/22/2013  . Gallstone 07/27/2012  . Acute diverticulitis 07/27/2012  . Partial tear of subscapularis tendon 04/22/2012  . Supraspinatus tendon tear 04/22/2012  . Trochanteric bursitis of right hip   . Asymptomatic postmenopausal status 01/04/2009  . Dyslipidemia 09/27/2008  . Essential hypertension 06/28/2008    Past Surgical History:  Procedure Laterality Date  . ABDOMINAL HYSTERECTOMY  1987   Partial  . APPENDECTOMY  2003  . CERVICAL FUSION  2002  . COLONOSCOPY  06/03/04   isch colitis (hosp for same)  . HERNIA REPAIR  1975   umb  . SHOULDER ARTHROSCOPY WITH ROTATOR CUFF REPAIR AND SUBACROMIAL DECOMPRESSION Right 04/22/2012   Procedure: SHOULDER ARTHROSCOPY WITH ROTATOR CUFF REPAIR AND SUBACROMIAL DECOMPRESSION;  Surgeon: Johnny Bridge, MD;  Location: [place];  Service: Orthopedics;  Laterality: Right;  RIGHT SHOULDER ARTHROSCOPY DEBRIDEMENT LIMITED, DECOMPRESSION SUBACROMIAL PARTIAL ACROMIOPLASTY WITH CORACOACROMIAL RELEASE, WITH ROTATOR CUFF REPAIR AND SUBSCAPULARIS REPAIR  . TUBAL LIGATION      Family History  Problem Relation Age of Onset  . Hypertension Father   . Stroke Father   . Aneurysm Mother     brain  . Stroke Sister   . Hypertension Sister   . Diabetes Brother  History  Drug Use No  ,  History  Alcohol Use No  ,  History  Smoking Status  . Never Smoker  Smokeless Tobacco  . Never Used    Comment: exposed to second hand (spouse smokes), Married  ,  History  Sexual Activity  . Sexual activity: Yes  . Birth control/ protection: Surgical    Patient's Medications  New Prescriptions   NITROFURANTOIN, MACROCRYSTAL-MONOHYDRATE, (MACROBID) 100 MG CAPSULE    Take 1 capsule (100 mg total) by mouth 2 (two) times daily.   PHENAZOPYRIDINE (PYRIDIUM) 200 MG TABLET    Take 1 tablet (200 mg total) by mouth 3 (three) times daily.    Previous Medications   ASPIRIN EC 81 MG TABLET    Take 1 tablet (81 mg total) by mouth daily.   BIOTIN 1 MG CAPS    Take 1 capsule by mouth daily.   CHOLECALCIFEROL (VITAMIN D3) 2000 UNITS TABS    Take 1 tablet by mouth daily.   CLOBETASOL (TEMOVATE) 0.05 % EXTERNAL SOLUTION    Apply 1 application topically 3 (three) times a week.   EZETIMIBE (ZETIA) 10 MG TABLET    Take 1 tablet by mouth daily.   FINASTERIDE (PROSCAR) 5 MG TABLET    Take 2.5 mg by mouth.   HYDROXYCHLOROQUINE (PLAQUENIL) 200 MG TABLET    Take 200 mg by mouth 2 (two) times daily.   MELOXICAM (MOBIC) 15 MG TABLET    Take 15 mg by mouth daily as needed.   OLMESARTAN (BENICAR) 40 MG TABLET    Take 1 tablet by mouth daily.  Modified Medications   No medications on file  Discontinued Medications   FUROSEMIDE (LASIX) 20 MG TABLET    Take 10 mg by mouth daily.    Codeine; Statins; and Benicar [olmesartan]  Current Meds  Medication Sig  . aspirin EC 81 MG tablet Take 1 tablet (81 mg total) by mouth daily.  . Cholecalciferol (VITAMIN D3) 2000 units TABS Take 1 tablet by mouth daily.  . clobetasol (TEMOVATE) 0.05 % external solution Apply 1 application topically 3 (three) times a week.  . ezetimibe (ZETIA) 10 MG tablet Take 1 tablet by mouth daily.  . finasteride (PROSCAR) 5 MG tablet Take 2.5 mg by mouth.  . hydroxychloroquine (PLAQUENIL) 200 MG tablet Take 200 mg by mouth 2 (two) times daily.  . meloxicam (MOBIC) 15 MG tablet Take 15 mg by mouth daily as needed.  Marland Kitchen olmesartan (BENICAR) 40 MG tablet Take 1 tablet by mouth daily.    Review of Systems  Constitutional: Positive for malaise/fatigue. Negative for chills and fever.  Respiratory: Negative for shortness of breath.   Gastrointestinal: Positive for abdominal pain. Negative for diarrhea, nausea and vomiting.  Genitourinary: Negative for flank pain, frequency, hematuria and urgency.       No Genital discharge/ complaints  Musculoskeletal: Positive for back pain.   Skin: Negative for rash.  Neurological: Negative for dizziness, focal weakness and loss of consciousness.  Endo/Heme/Allergies: Negative for polydipsia.  Psychiatric/Behavioral:       Mood- stable    Objective:  Blood pressure (!) 143/88, pulse 61, temperature 98.3 F (36.8 C), temperature source Oral, resp. rate 17, weight 199 lb (90.3 kg), SpO2 98 %. Body mass index is 32.12 kg/m.  General: Well Developed, well nourished, and in no acute distress.  HEENT: Normocephalic, atraumatic Skin: Warm and dry, cap RF less 2 sec, good turgor CV: +S1, S2 Respiratory: ECTA B/L; speaking in full sentences, no conversational dyspnea  Abd: Soft, NT, ND, No G/R/R, no SPT, No flank pain NeuroM-Sk: Ambulates w/o assistance, moves * 4 Psych: A and O *3     Impression and Recommendations:    1. Pelvic pressure in female   2. Hematuria, unspecified type    - Macrobid given, culture sent,   Pyridium when necessary pain.   change in therapy pending results of culture.  The patient was counseled, risk factors were discussed, anticipatory guidance given.  Gross side effects, risk and benefits, and alternatives of medications discussed with patient.  Patient is aware that all medications have potential side effects and we are unable to predict every side effect or drug-drug interaction that may occur.  Expresses verbal understanding and consents to current therapy plan and treatment regimen.  New Prescriptions   NITROFURANTOIN, MACROCRYSTAL-MONOHYDRATE, (MACROBID) 100 MG CAPSULE    Take 1 capsule (100 mg total) by mouth 2 (two) times daily.   PHENAZOPYRIDINE (PYRIDIUM) 200 MG TABLET    Take 1 tablet (200 mg total) by mouth 3 (three) times daily.    Orders Placed This Encounter  Procedures  . Urine Culture  . POCT Urinalysis Dipstick    Please see AVS handed out to patient at the end of our visit for further patient instructions/ counseling done pertaining to today's office visit.  Return  if symptoms worsen or fail to improve, for Follow-up chronic care management as discussed prior.    Note: This document was prepared using Dragon voice recognition software and may include unintentional dictation errors.

## 2016-03-13 NOTE — Patient Instructions (Signed)
  You likely have a urinary tract infection although it is not very clear cut. We will be sending it for culture to see if the antibiotic we give you is effective and if there is a significant amount of bacteria in urine to warrant antibiotic treatment. Please note we will be calling you within 3-5 days.   Also if you do not improve over the next 3-5 days, please follow-up at an urgent care or here for further evaluation.

## 2016-03-15 LAB — URINE CULTURE: ORGANISM ID, BACTERIA: NO GROWTH

## 2016-03-17 ENCOUNTER — Telehealth: Payer: Self-pay | Admitting: Family Medicine

## 2016-03-17 DIAGNOSIS — L659 Nonscarring hair loss, unspecified: Secondary | ICD-10-CM | POA: Diagnosis not present

## 2016-03-17 NOTE — Telephone Encounter (Signed)
03/17/16 @ 4:15  PT  request cb regarding Labs & status on whether to continue med or if Dr. Jenetta Downer is going to change them (rx).  [phone] she states you can leave a msg on her voicemail. Thanks Baker Janus

## 2016-03-17 NOTE — Telephone Encounter (Signed)
Pt informed of results.  Pt expressed understanding and is agreeable.  T. Nelson, CMA 

## 2016-07-08 ENCOUNTER — Encounter: Payer: Self-pay | Admitting: Family Medicine

## 2016-07-08 ENCOUNTER — Ambulatory Visit (INDEPENDENT_AMBULATORY_CARE_PROVIDER_SITE_OTHER): Payer: PPO | Admitting: Family Medicine

## 2016-07-08 VITALS — BP 172/98 | HR 58 | Ht 66.0 in | Wt 202.2 lb

## 2016-07-08 DIAGNOSIS — E669 Obesity, unspecified: Secondary | ICD-10-CM | POA: Diagnosis not present

## 2016-07-08 DIAGNOSIS — R5383 Other fatigue: Secondary | ICD-10-CM

## 2016-07-08 DIAGNOSIS — K5904 Chronic idiopathic constipation: Secondary | ICD-10-CM | POA: Diagnosis not present

## 2016-07-08 DIAGNOSIS — I10 Essential (primary) hypertension: Secondary | ICD-10-CM | POA: Diagnosis not present

## 2016-07-08 DIAGNOSIS — E559 Vitamin D deficiency, unspecified: Secondary | ICD-10-CM

## 2016-07-08 DIAGNOSIS — E785 Hyperlipidemia, unspecified: Secondary | ICD-10-CM

## 2016-07-08 MED ORDER — HYDROCHLOROTHIAZIDE 25 MG PO TABS: 25.0000 mg | ORAL_TABLET | Freq: Every day | ORAL | 1 refills | Status: DC

## 2016-07-08 MED ORDER — OLMESARTAN MEDOXOMIL 40 MG PO TABS: 40.0000 mg | ORAL_TABLET | Freq: Every day | ORAL | 1 refills | Status: DC

## 2016-07-08 NOTE — Assessment & Plan Note (Signed)
Is on Zetia, is unable to tolerate statin  Diet & lifestyle modifications discussed

## 2016-07-08 NOTE — Assessment & Plan Note (Signed)
rec inc fiber, water intake, exercise  - f/up GI if sx W/ NI as you may need change in med

## 2016-07-08 NOTE — Assessment & Plan Note (Signed)
Counseling done- advised wt loss.   Discussed with patient importance of weight loss to help achieve health goals and how increasing weight, correlates to overall energy levels and sense of wellness, as well as  increasing risk of various diseases. (or increasing risk of not controlling existing diseases.)  F/up sooner than planned if you would like to further discuss strategies to lose weight  - Weight watchers recommended;   Declines nutrition counseling with dietary specialist

## 2016-07-08 NOTE — Assessment & Plan Note (Signed)
START HCTZ in addition to olmesartan.  Pt resists today but BP at home and here not well controlled.  - low salt  - wt loss  - exercise  - inc water intake  - adequate sleep

## 2016-07-08 NOTE — Assessment & Plan Note (Signed)
TOld to take supp of at least 5k IU qd.    Reck levels 75mo after on supp  Could help with fatigue

## 2016-07-08 NOTE — Assessment & Plan Note (Addendum)
Exercise  prudent diet  Sleep hygeine  Denies depression  - f/up appt to discuss further prn

## 2016-07-08 NOTE — Patient Instructions (Signed)
Check out the DASH diet = 1.5 Gram Low Sodium Diet   A 1.5 gram sodium diet restricts the amount of sodium in the diet to no more than 1.5 g or 1500 mg daily.  The American Heart Association recommends Americans over the age of 71 to consume no more than 1500 mg of sodium each day to reduce the risk of developing high blood pressure.  Research also shows that limiting sodium may reduce heart attack and stroke risk.  Many foods contain sodium for flavor and sometimes as a preservative.  When the amount of sodium in a diet needs to be low, it is important to know what to look for when choosing foods and drinks.  The following includes some information and guidelines to help make it easier for you to adapt to a low sodium diet.   QUICK TIPS  Do not add salt to food.  Avoid convenience items and fast food.  Choose unsalted snack foods.  Buy lower sodium products, often labeled as "lower sodium" or "no salt added."  Check food labels to learn how much sodium is in 1 serving.  When eating at a restaurant, ask that your food be prepared with less salt or none, if possible.   READING FOOD LABELS FOR SODIUM INFORMATION  The nutrition facts label is a good place to find how much sodium is in foods. Look for products with no more than 100 mg of sodium per serving.  Remember that 1.5 g = 1500 mg.   The food label may also list foods as:  Sodium-free: Less than 5 mg in a serving.  Very low sodium: 35 mg or less in a serving.  Low-sodium: 140 mg or less in a serving.  Light in sodium: 50% less sodium in a serving. For example, if a food that usually has 300 mg of sodium is changed to become light in sodium, it will have 150 mg of sodium.  Reduced sodium: 25% less sodium in a serving. For example, if a food that usually has 400 mg of sodium is changed to reduced sodium, it will have 300 mg of sodium.   CHOOSING FOODS  Grains  Avoid: Salted crackers and snack items. Some cereals, including instant hot  cereals. Bread stuffing and biscuit mixes. Seasoned rice or pasta mixes.  Choose: Unsalted snack items. Low-sodium cereals, oats, puffed wheat and rice, shredded wheat. English muffins and bread. Pasta.  Meats  Avoid: Salted, canned, smoked, spiced, pickled meats, including fish and poultry. Bacon, ham, sausage, cold cuts, hot dogs, anchovies.  Choose: Low-sodium canned tuna and salmon. Fresh or frozen meat, poultry, and fish.  Dairy  Avoid: Processed cheese and spreads. Cottage cheese. Buttermilk and condensed milk. Regular cheese.  Choose: Milk. Low-sodium cottage cheese. Yogurt. Sour cream. Low-sodium cheese.  Fruits and Vegetables  Avoid: Regular canned vegetables. Regular canned tomato sauce and paste. Frozen vegetables in sauces. Olives. Angie Fava. Relishes. Sauerkraut.  Choose: Low-sodium canned vegetables. Low-sodium tomato sauce and paste. Frozen or fresh vegetables. Fresh and frozen fruit.  Condiments  Avoid: Canned and packaged gravies. Worcestershire sauce. Tartar sauce. Barbecue sauce. Soy sauce. Steak sauce. Ketchup. Onion, garlic, and table salt. Meat flavorings and tenderizers.  Choose: Fresh and dried herbs and spices. Low-sodium varieties of mustard and ketchup. Lemon juice. Tabasco sauce. Horseradish.   SAMPLE 1.5 GRAM SODIUM MEAL PLAN  Breakfast / Sodium (mg)  1 cup low-fat milk / 143 mg  1 whole-wheat English muffin / 240 mg  1 tbs heart-healthy margarine /  153 mg  1 hard-boiled egg / 139 mg  1 small orange / 0 mg  Lunch / Sodium (mg)  1 cup raw carrots / 76 mg  2 tbs no salt added peanut butter / 5 mg  2 slices whole-wheat bread / 270 mg  1 tbs jelly / 6 mg   cup red grapes / 2 mg  Dinner / Sodium (mg)  1 cup whole-wheat pasta / 2 mg  1 cup low-sodium tomato sauce / 73 mg  3 oz lean ground beef / 57 mg  1 small side salad (1 cup raw spinach leaves,  cup cucumber,  cup yellow bell pepper) with 1 tsp olive oil and 1 tsp red wine vinegar / 25 mg  Snack / Sodium  (mg)  1 container low-fat vanilla yogurt / 107 mg  3 graham cracker squares / 127 mg  Nutrient Analysis  Calories: 1745  Protein: 75 g  Carbohydrate: 237 g  Fat: 57 g  Sodium: 1425 mg  Document Released: 02/16/2005 Document Revised: 10/29/2010 Document Reviewed: 05/20/2009  [facility] Patient Information 2012 [place], [place].     Hypertension Hypertension, commonly called high blood pressure, is when the force of blood pumping through the arteries is too strong. The arteries are the blood vessels that carry blood from the heart throughout the body. Hypertension forces the heart to work harder to pump blood and may cause arteries to become narrow or stiff. Having untreated or uncontrolled hypertension can cause heart attacks, strokes, kidney disease, and other problems. A blood pressure reading consists of a higher number over a lower number. Ideally, your blood pressure should be below 120/80. The first ("top") number is called the systolic pressure. It is a measure of the pressure in your arteries as your heart beats. The second ("bottom") number is called the diastolic pressure. It is a measure of the pressure in your arteries as the heart relaxes. What are the causes? The cause of this condition is not known. What increases the risk? Some risk factors for high blood pressure are under your control. Others are not. Factors you can change   Smoking.  Having type 2 diabetes mellitus, high cholesterol, or both.  Not getting enough exercise or physical activity.  Being overweight.  Having too much fat, sugar, calories, or salt (sodium) in your diet.  Drinking too much alcohol. Factors that are difficult or impossible to change   Having chronic kidney disease.  Having a family history of high blood pressure.  Age. Risk increases with age.  Race. You may be at higher risk if you are African-American.  Gender. Men are at higher risk than women before age 12. After age 55, women  are at higher risk than men.  Having obstructive sleep apnea.  Stress. What are the signs or symptoms? Extremely high blood pressure (hypertensive crisis) may cause:  Headache.  Anxiety.  Shortness of breath.  Nosebleed.  Nausea and vomiting.  Severe chest pain.  Jerky movements you cannot control (seizures). How is this diagnosed? This condition is diagnosed by measuring your blood pressure while you are seated, with your arm resting on a surface. The cuff of the blood pressure monitor will be placed directly against the skin of your upper arm at the level of your heart. It should be measured at least twice using the same arm. Certain conditions can cause a difference in blood pressure between your right and left arms. Certain factors can cause blood pressure readings to be lower or higher  than normal (elevated) for a short period of time:  When your blood pressure is higher when you are in a health care provider's office than when you are at home, this is called white coat hypertension. Most people with this condition do not need medicines.  When your blood pressure is higher at home than when you are in a health care provider's office, this is called masked hypertension. Most people with this condition may need medicines to control blood pressure. If you have a high blood pressure reading during one visit or you have normal blood pressure with other risk factors:  You may be asked to return on a different day to have your blood pressure checked again.  You may be asked to monitor your blood pressure at home for 1 week or longer. If you are diagnosed with hypertension, you may have other blood or imaging tests to help your health care provider understand your overall risk for other conditions. How is this treated? This condition is treated by making healthy lifestyle changes, such as eating healthy foods, exercising more, and reducing your alcohol intake. Your health care  provider may prescribe medicine if lifestyle changes are not enough to get your blood pressure under control, and if:  Your systolic blood pressure is above 130.  Your diastolic blood pressure is above 80. Your personal target blood pressure may vary depending on your medical conditions, your age, and other factors. Follow these instructions at home: Eating and drinking   Eat a diet that is high in fiber and potassium, and low in sodium, added sugar, and fat. An example eating plan is called the DASH (Dietary Approaches to Stop Hypertension) diet. To eat this way:  Eat plenty of fresh fruits and vegetables. Try to fill half of your plate at each meal with fruits and vegetables.  Eat whole grains, such as whole wheat pasta, brown rice, or whole grain bread. Fill about one quarter of your plate with whole grains.  Eat or drink low-fat dairy products, such as skim milk or low-fat yogurt.  Avoid fatty cuts of meat, processed or cured meats, and poultry with skin. Fill about one quarter of your plate with lean proteins, such as fish, chicken without skin, beans, eggs, and tofu.  Avoid premade and processed foods. These tend to be higher in sodium, added sugar, and fat.  Reduce your daily sodium intake. Most people with hypertension should eat less than 1,500 mg of sodium a day.  Limit alcohol intake to no more than 1 drink a day for nonpregnant women and 2 drinks a day for men. One drink equals 12 oz of beer, 5 oz of wine, or 1 oz of hard liquor. Lifestyle   Work with your health care provider to maintain a healthy body weight or to lose weight. Ask what an ideal weight is for you.  Get at least 30 minutes of exercise that causes your heart to beat faster (aerobic exercise) most days of the week. Activities may include walking, swimming, or biking.  Include exercise to strengthen your muscles (resistance exercise), such as pilates or lifting weights, as part of your weekly exercise routine.  Try to do these types of exercises for 30 minutes at least 3 days a week.  Do not use any products that contain nicotine or tobacco, such as cigarettes and e-cigarettes. If you need help quitting, ask your health care provider.  Monitor your blood pressure at home as told by your health care provider.  Keep  all follow-up visits as told by your health care provider. This is important. Medicines   Take over-the-counter and prescription medicines only as told by your health care provider. Follow directions carefully. Blood pressure medicines must be taken as prescribed.  Do not skip doses of blood pressure medicine. Doing this puts you at risk for problems and can make the medicine less effective.  Ask your health care provider about side effects or reactions to medicines that you should watch for. Contact a health care provider if:  You think you are having a reaction to a medicine you are taking.  You have headaches that keep coming back (recurring).  You feel dizzy.  You have swelling in your ankles.  You have trouble with your vision. Get help right away if:  You develop a severe headache or confusion.  You have unusual weakness or numbness.  You feel faint.  You have severe pain in your chest or abdomen.  You vomit repeatedly.  You have trouble breathing. Summary  Hypertension is when the force of blood pumping through your arteries is too strong. If this condition is not controlled, it may put you at risk for serious complications.  Your personal target blood pressure may vary depending on your medical conditions, your age, and other factors. For most people, a normal blood pressure is less than 120/80.  Hypertension is treated with lifestyle changes, medicines, or a combination of both. Lifestyle changes include weight loss, eating a healthy, low-sodium diet, exercising more, and limiting alcohol. This information is not intended to replace advice given to you by your  health care provider. Make sure you discuss any questions you have with your health care provider. Document Released: 02/16/2005 Document Revised: 01/15/2016 Document Reviewed: 01/15/2016 Elsevier Interactive Patient Education  2017 [employer].

## 2016-07-08 NOTE — Progress Notes (Signed)
Impression and Recommendations:    1. Essential hypertension   2. Other fatigue   3. Chronic idiopathic constipation   4. Obesity (BMI 30-39.9)   5. Vitamin D deficiency   6. Dyslipidemia     Vitamin D deficiency TOld to take supp of at least 5k IU qd.    Reck levels 2mo after on supp  Could help with fatigue  Chronic Fatigue Exercise  prudent diet  Sleep hygeine  Denies depression  - f/up appt to discuss further prn   Chronic constipation- sees GI rec inc fiber, water intake, exercise  - f/up GI if sx W/ NI as you may need change in med  Obesity (BMI 30-39.9) Counseling done- advised wt loss.   Discussed with patient importance of weight loss to help achieve health goals and how increasing weight, correlates to overall energy levels and sense of wellness, as well as  increasing risk of various diseases. (or increasing risk of not controlling existing diseases.)  F/up sooner than planned if you would like to further discuss strategies to lose weight  - Weight watchers recommended;   Declines nutrition counseling with dietary specialist  Essential hypertension START HCTZ in addition to olmesartan.  Pt resists today but BP at home and here not well controlled.  - low salt  - wt loss  - exercise  - inc water intake  - adequate sleep  Dyslipidemia Is on Zetia, is unable to tolerate statin  Diet & lifestyle modifications discussed    Education and routine counseling performed. Handouts provided.   New Prescriptions   HYDROCHLOROTHIAZIDE (HYDRODIURIL) 25 MG TABLET    Take 1 tablet (25 mg total) by mouth daily.    Modified Medications   Modified Medication Previous Medication   OLMESARTAN (BENICAR) 40 MG TABLET olmesartan (BENICAR) 40 MG tablet      Take 1 tablet (40 mg total) by mouth daily.    Take 1 tablet by mouth daily.     Orders Placed This Encounter  Procedures  . Basic metabolic panel    Return in about 6 weeks (around  08/19/2016) for Hypertension f/up after starting new med.  The patient was counseled, risk factors were discussed, anticipatory guidance given.  Gross side effects, risk and benefits, and alternatives of medications discussed with patient.  Patient is aware that all medications have potential side effects and we are unable to predict every side effect or drug-drug interaction that may occur.  Expresses verbal understanding and consents to current therapy plan and treatment regimen.  Please see AVS handed out to patient at the end of our visit for further patient instructions/ counseling done pertaining to today's office visit.    Note: This document was prepared using Dragon voice recognition software and may include unintentional dictation errors.     Subjective:    Chief Complaint  Patient presents with  . Medication Problem  . Hypertension    HPI: Bailey [name] is a 66 y.o. female who presents to [place] at [facility] today for follow up for HTN.    CC-  - Her primary concern today is that she feels fatigued and tired.  Not well rested when awakens in am.  Wondering if it is her BP med causing it.  These sx are chronic, ongoing for many yrs--- not since bp med added.   Still no exercise at all,  ok diet--> just started [facility] per pt b/c diet was so bad and she needed  direction.    HTN:  - Patient reports good compliance with blood pressure medications  - Denies medication S-E other than maybe feeling tired in am's, not rested. Not new sx  -  At home--> 140-150's/ 85-90  - She denies new onset of: chest pain, exercise intolerance, shortness of breath, dizziness, visual changes, headache, lower extremity swelling or claudication.   Today their BP is BP: (!) 172/98   Last 3 blood pressure readings in our office are as follows: BP Readings from Last 3 Encounters:  07/08/16 (!) 172/98  03/13/16 (!) 143/88  12/05/15 139/83    Pulse Readings from Last 3  Encounters:  07/08/16 (!) 58  03/13/16 61  12/05/15 (!) 55     Obesity:  Wt- up from prior couple pounds. Pt depressed over this, she lacks energy- esp when gains wt - started [employer] recently  [employer]   07/08/16 0938  Weight: 202 lb 3.2 oz (91.7 kg)        Patient Care Team    Relationship Specialty Notifications Start End  Mellody Dance, DO PCP - General Family Medicine  12/05/15   Marchia Bond, MD  Orthopedic Surgery  04/07/12   Ladene Artist, MD  Gastroenterology  07/27/12      Lab Results  Component Value Date   CREATININE 0.7 12/04/2015   BUN 14 12/04/2015   NA 139 12/04/2015   K 4.9 12/04/2015   CL 100 07/27/2014   CO2 31 07/27/2014    Lab Results  Component Value Date   CHOL 203 (A) 12/04/2015   CHOL 251 (H) 07/27/2014   CHOL 248 (H) 06/22/2013    Lab Results  Component Value Date   HDL 46 12/04/2015   HDL 52.20 07/27/2014   HDL 46.30 06/22/2013    Lab Results  Component Value Date   LDLCALC 132 12/04/2015   LDLCALC 171 (H) 07/27/2014   LDLCALC 182 (H) 06/22/2013    Lab Results  Component Value Date   TRIG 125 12/04/2015   TRIG 138.0 07/27/2014   TRIG 101.0 06/22/2013    Lab Results  Component Value Date   CHOLHDL 5 07/27/2014   CHOLHDL 5 06/22/2013   CHOLHDL 5 09/20/2012    Lab Results  Component Value Date   LDLDIRECT 177.1 09/20/2012   LDLDIRECT 159.1 08/28/2011   LDLDIRECT 182.2 07/24/2010   ===================================================================   Patient Active Problem List   Diagnosis Date Noted  . Obesity (BMI 30-39.9) 06/22/2013    Priority: High  . Dyslipidemia 09/27/2008    Priority: High  . Essential hypertension 06/28/2008    Priority: High  . Chronic Fatigue 07/27/2014    Priority: Medium  . Chronic constipation- sees GI 07/27/2014    Priority: Medium  . Hx of cardiovascular stress test 12/22/2015    Priority: Low  . Vitamin D deficiency 12/22/2015    Priority: Low  . Generalized  OA 10/29/2014    Priority: Low  . Hematuria 03/13/2016  . Pelvic pressure in female 03/13/2016  . Rectus diastasis 08/09/2013  . Gallstone 07/27/2012  . Acute diverticulitis 07/27/2012  . Partial tear of subscapularis tendon 04/22/2012  . Supraspinatus tendon tear 04/22/2012  . Trochanteric bursitis of right hip   . Asymptomatic postmenopausal status 01/04/2009     Past Medical History:  Diagnosis Date  . Arthritis   . Colitis    hx colitis-gi bleed-2006  . DYSLIPIDEMIA   . Hx of cardiovascular stress test    Lexiscan Myoview 9/14:  Small, fixed  apical anterior perfusion defect (worse at rest) - probable soft tissue attenuation, EF 61%, low risk study  . HYPERTENSION   . Partial tear of subscapularis tendon 04/22/2012  . POSTMENOPAUSAL STATUS   . Supraspinatus tendon tear 04/22/2012     Past Surgical History:  Procedure Laterality Date  . ABDOMINAL HYSTERECTOMY  1987   Partial  . APPENDECTOMY  2003  . CERVICAL FUSION  2002  . COLONOSCOPY  06/03/04   isch colitis (hosp for same)  . HERNIA REPAIR  1975   umb  . SHOULDER ARTHROSCOPY WITH ROTATOR CUFF REPAIR AND SUBACROMIAL DECOMPRESSION Right 04/22/2012   Procedure: SHOULDER ARTHROSCOPY WITH ROTATOR CUFF REPAIR AND SUBACROMIAL DECOMPRESSION;  Surgeon: Johnny Bridge, MD;  Location: [facility];  Service: Orthopedics;  Laterality: Right;  RIGHT SHOULDER ARTHROSCOPY DEBRIDEMENT LIMITED, DECOMPRESSION SUBACROMIAL PARTIAL ACROMIOPLASTY WITH CORACOACROMIAL RELEASE, WITH ROTATOR CUFF REPAIR AND SUBSCAPULARIS REPAIR  . TUBAL LIGATION       Family History  Problem Relation Age of Onset  . Hypertension Father   . Stroke Father   . Aneurysm Mother     brain  . Stroke Sister   . Hypertension Sister   . Diabetes Brother      History  Drug Use No  ,  History  Alcohol Use No  ,  History  Smoking Status  . Never Smoker  Smokeless Tobacco  . Never Used    Comment: exposed to second hand (spouse smokes),  Married  ,    Current Outpatient Prescriptions on File Prior to Visit  Medication Sig Dispense Refill  . aspirin EC 81 MG tablet Take 1 tablet (81 mg total) by mouth daily. 100 tablet 3  . ezetimibe (ZETIA) 10 MG tablet Take 1 tablet by mouth daily.     No current facility-administered medications on file prior to visit.      Allergies  Allergen Reactions  . Codeine   . Statins   . Benicar [Olmesartan] Other (See Comments)    ?able hair thinning     Review of Systems:   General:  Denies fever, chills Optho/Auditory:   Denies visual changes, blurred vision Respiratory:   Denies SOB, cough, wheeze, DIB  Cardiovascular:   Denies chest pain, palpitations, painful respirations Gastrointestinal:   Denies nausea, vomiting, diarrhea.  Endocrine:     Denies new hot or cold intolerance Musculoskeletal:  Denies joint swelling, gait issues, or new unexplained myalgias/ arthralgias Skin:  Denies rash, suspicious lesions  Neurological:    Denies dizziness, unexplained weakness, numbness  Psychiatric/Behavioral:   Denies mood changes  Objective:    Blood pressure (!) 172/98, pulse (!) 58, height 5\' 6"  (1.676 m), weight 202 lb 3.2 oz (91.7 kg).  Body mass index is 32.64 kg/m.  General: Well Developed, well nourished, and in no acute distress.  HEENT: Normocephalic, atraumatic, pupils equal round reactive to light, neck supple, No carotid bruits, no JVD Skin: Warm and dry, cap RF less 2 sec Cardiac: Regular rate and rhythm, S1, S2 WNL's, no murmurs rubs or gallops Respiratory: ECTA B/L, Not using accessory muscles, speaking in full sentences. NeuroM-Sk: Ambulates w/o assistance, moves ext * 4 w/o difficulty, sensation grossly intact.  Ext: scant edema b/l lower ext Psych: No HI/SI, judgement and insight good, Euthymic mood. Full Affect.

## 2016-07-09 ENCOUNTER — Telehealth: Payer: Self-pay | Admitting: Family Medicine

## 2016-07-09 NOTE — Telephone Encounter (Signed)
Patient called and has a few questions about her medication regiment that she forgot to ask Dr. Jenetta Downer about yesterday and would like to speak to someone about it.

## 2016-07-09 NOTE — Telephone Encounter (Signed)
Pt called inquiring if she could continue taking Biotin and Vitamin D3 2000IU daily.  Advised pt that it was fine to continue both of these medications.  Charyl Bigger, CMA

## 2016-07-14 DIAGNOSIS — L661 Lichen planopilaris: Secondary | ICD-10-CM | POA: Diagnosis not present

## 2016-08-17 ENCOUNTER — Ambulatory Visit (INDEPENDENT_AMBULATORY_CARE_PROVIDER_SITE_OTHER): Payer: PPO | Admitting: Family Medicine

## 2016-08-17 ENCOUNTER — Encounter: Payer: Self-pay | Admitting: Family Medicine

## 2016-08-17 VITALS — BP 127/86 | HR 65 | Ht 66.0 in | Wt 198.8 lb

## 2016-08-17 DIAGNOSIS — Z9289 Personal history of other medical treatment: Secondary | ICD-10-CM

## 2016-08-17 DIAGNOSIS — E669 Obesity, unspecified: Secondary | ICD-10-CM | POA: Diagnosis not present

## 2016-08-17 DIAGNOSIS — F411 Generalized anxiety disorder: Secondary | ICD-10-CM | POA: Diagnosis not present

## 2016-08-17 DIAGNOSIS — F430 Acute stress reaction: Secondary | ICD-10-CM | POA: Diagnosis not present

## 2016-08-17 DIAGNOSIS — E785 Hyperlipidemia, unspecified: Secondary | ICD-10-CM | POA: Diagnosis not present

## 2016-08-17 DIAGNOSIS — I10 Essential (primary) hypertension: Secondary | ICD-10-CM | POA: Diagnosis not present

## 2016-08-17 DIAGNOSIS — R009 Unspecified abnormalities of heart beat: Secondary | ICD-10-CM | POA: Insufficient documentation

## 2016-08-17 DIAGNOSIS — Z0000 Encounter for general adult medical examination without abnormal findings: Secondary | ICD-10-CM

## 2016-08-17 MED ORDER — EZETIMIBE 10 MG PO TABS: 10.0000 mg | ORAL_TABLET | Freq: Every day | ORAL | 1 refills | Status: DC

## 2016-08-17 NOTE — Assessment & Plan Note (Signed)
Currently well controlled.  Improved since starting hydrochlorothiazide last office visit on 5\9  - Continue current medications.  - Explained acute stress reaction will cause blood pressure and heart rate elevation.  However, it should never cause chest pain, shortness of breath, heart palpitations, etc.

## 2016-08-17 NOTE — Assessment & Plan Note (Signed)
Symptoms appeared to have been related to a panic type attacks.  - Deep breathing / square breathing reviewed with patient  - Exercise daily to goal of 30 minutes  - Stress management techniques discussed

## 2016-08-17 NOTE — Assessment & Plan Note (Signed)
Patient would like to avoid medications at all cost.  She acknowledges she has anxiety and excess worry about things that is negatively affecting her health but declines medications at this time.  - Encouraged life coach\ counselor

## 2016-08-17 NOTE — Patient Instructions (Signed)
Fasting labs were done in early October 2017 and that's when I recommend he make a follow-up again for recheck of all of your labs.    If you develop any new symptoms please let us know otherwise please work on the deep breathing and stress management techniques that we discussed.  Please see about obtaining a counselor\life coach as well these can be very helpful.    What is Chronic Stress Syndrome, Symptoms & Ways to Deal With it   What is Chronic Stress Syndrome?  Chronic Stress Syndrome is something which can now be called as a medical condition due to the amount of stress an individual is going through these days. Chronic Stress Syndrome causes the body and mind to shutdown and the person has no control over himself or herself. Due to the demands of modern day life and the hardship throughout day and night takes its toll over a period of time and the body and brain starts demanding rest and a break. This leads to certain symptoms where your performance level starts to dip at work, you become irritable both at work and at home, you may stop enjoying activities you previously liked, you may become depressed, you may get angry for even small things. Chronic Stress Syndrome can significantly impact your quality life. Thus it is important understand the symptoms of Chronic Stress Syndrome and react accordingly in order to cope up with it.  It is important to note here that a balanced work-home equation should be drawn to cut down symptoms of Chronic Stress Syndrome. Minor stressors can be overcome by the body's inbuilt stress response but when there is unending stress for a long period of time then an external help is required to ease the stress.  Chronic Stress Syndrome can physically and psychologically drain you over a period of time. For such cases stress management is the best way to cope up with Chronic Stress Syndrome. If Chronic Stress Syndrome is not treated then it may result in many  health hazards like anxiety, muscle pain, insomnia, and high blood pressure along with a compromised immune system leading to frequent infections and missed days from work.    What are the Symptoms of Chronic Stress Syndrome?   The symptoms of Chronic Stress Syndrome are variable and range from generalized symptoms to emotional symptoms along with behavioral and cognitive symptoms. Some of these symptoms have been delineated below:  Generalized Symptoms of Chronic Stress Syndrome are: Anxiety Depression Social isolation Headache Abdominal pain Lack of sleep Back pain Difficulty in concentrating Hypertension Hemorrhoids Varicose veins Panic attacks/ Panic disorder Cardiovascular diseases.   Some of the Emotional Symptoms of Chronic Stress Syndrome are: To become easily agitated, moody and frustrated Feeling overwhelmed which makes you feel like you are losing control. Having difficulty relaxing and have a peaceful mind Having low self esteem Feeling lonely Feeling worthless Feeling depressed Avoiding social environment.   Some of the Physical Symptoms of Chronic Stress Syndrome are: Headaches Lethargy Alternating diarrhea and constipation Nausea Muscles aches and pains Insomnia Rapid heartbeat and chest pain Infections and frequent colds Decreased libido Nervousness and shaking Tinnitus Sweaty palms Dry mouth Clenched jaw.  Some of the Cognitive Symptoms of Chronic Stress Syndrome are: Constant worrying Racing thoughts Disorganization and forgetfulness Inability to focus Poor judgment Abundance of negativity.  Some of the Behavioral Symptoms of Chronic Stress Syndrome are: Changes in appetite with less desire to eat Avoiding responsibilities Indulgence in alcohol or recreational drug use Increased nail biting  and being fidgety Ways to Deal With Chronic Stress Syndrome    Chronic Stress Syndrome is not something which cannot be addressed. A bit of  effort from your side in the form of lifestyle modifications, a little bit of exercise, a balanced work life equation can do wonders and help you get rid of Chronic Stress Syndrome.  Get Proper Sleep: It has been proved that Chronic Stress Syndrome causes loss of sleep where an individual may not even be able to sleep for days unending. This may result in the individual feeling lethargic and unable to focus at work the following morning. This may lead to decreased performance at work. Thus, it is important to have a good sleep-wake cycle. For this, try and not drink any caffeinated beverage about four hours prior to going to sleep, as caffeine pumps up the adrenaline and causes you to stay awake resulting ultimately in Chronic Stress Syndrome.  Avoid Alcohol and Drugs: Another way to get rid of Chronic Stress Syndrome is lifestyle modifications. Stay away from alcohol and other recreational drugs. Take Short Frequent Breaks at Work: Try to take frequent breaks from work and do not work continuously. Try and manage your work in such a way that you even meet your deadline and come home on time for a happy dinner with family. A good time spent with family and kids does wonders in not only dealing with Chronic Stress Syndrome but also preventing it.  Become Physically Active: Another step towards getting rid of Chronic Stress Syndrome is physical activity. If you do not have time to spend at the gym then at least try and go for daily walks for about half an hour a day which not only keeps the stress away but also is good for your overall health. Physical activity leads to production of endorphins which will make you feel relaxed and feel good.  Healthy Diet Can Help You Deal With Chronic Stress Syndrome: Have a balanced and healthy diet is another step towards a stress free life and keeping Chronic Stress Syndrome at [place]. If time is a constraint then you can try eating three small meals a day. Try and avoid  fast foods and take foods which are healthy and rich in proteins, fiber, and carbohydrates to boost your energy system.  Music Can Soothe Your Mind: Light music is one of the best and most effective relaxation techniques that one can try to overcome stress. It has shown to calm down the mind and take you away from all the stressors that you may be having. These days it is also being used as a therapy in some institutes for overcoming stress. It is important here to discuss the importance of a good social support system for patients with Chronic Stress Syndrome, as a good social support framework can do wonders in taking the stress away from the patient and overcoming Chronic Stress Syndrome.  Meditation Can Help You Deal With Chronic Stress Syndrome Effectively: Meditation and yoga has also shown to be quite effective in relaxing the mind and coping up with Chronic Stress Syndrome   In cases where these measures are not helpful, then it is time for you to consult with a skilled psychologist or a psychiatrist for potential therapies or medications to control the stress response.   The psychologist can help you with a variety of steps for coping up with Chronic Stress Syndrome. Relaxation techniques and behavioral therapy are some of the methods employed by psychologists. In some cases,  medications can also be given to help relax the patient.  Since Chronic Stress Syndrome is both emotionally and physically draining for the patient and it also adversely affects the family life of the patient hence it is important for the patient to recognize the condition and taking steps to cope up with it. Escaping measures like alcohol and drug use are of no help as they only aggravate the condition apart from their other health hazards. If this condition is ignored or left untreated it can lead to various medical conditions like anxiety and depression and various other medical conditions.  Last but not least, smile as  often as you can as it is the best gift that you can give to someone. The best way to stay relaxed is to have a good smile, exercise daily, spend time with your family, meditation and if required consultation with a good psychologist so that you can live a stress free life and overcome the symptoms of Chronic Stress Syndrome.    Preventive Care for Adults, Female  A healthy lifestyle and preventive care can promote health and wellness. Preventive health guidelines for women include the following key practices.   A routine yearly physical is a good way to check with your health care provider about your health and preventive screening. It is a chance to share any concerns and updates on your health and to receive a thorough exam.   Visit your dentist for a routine exam and preventive care every 6 months. Brush your teeth twice a day and floss once a day. Good oral hygiene prevents tooth decay and gum disease.   The frequency of eye exams is based on your age, health, family medical history, use of contact lenses, and other factors. Follow your health care provider's recommendations for frequency of eye exams.   Eat a healthy diet. Foods like vegetables, fruits, whole grains, low-fat dairy products, and lean protein foods contain the nutrients you need without too many calories. Decrease your intake of foods high in solid fats, added sugars, and salt. Eat the right amount of calories for you.Get information about a proper diet from your health care provider, if necessary.   Regular physical exercise is one of the most important things you can do for your health. Most adults should get at least 150 minutes of moderate-intensity exercise (any activity that increases your heart rate and causes you to sweat) each week. In addition, most adults need muscle-strengthening exercises on 2 or more days a week.   Maintain a healthy weight. The body mass index (BMI) is a screening tool to identify possible  weight problems. It provides an estimate of body fat based on height and weight. Your health care provider can find your BMI, and can help you achieve or maintain a healthy weight.For adults 20 years and older:   - A BMI below 18.5 is considered underweight.   - A BMI of 18.5 to 24.9 is normal.   - A BMI of 25 to 29.9 is considered overweight.   - A BMI of 30 and above is considered obese.   Maintain normal blood lipids and cholesterol levels by exercising and minimizing your intake of trans and saturated fats.  Eat a balanced diet with plenty of fruit and vegetables. Blood tests for lipids and cholesterol should begin at age 53 and be repeated every 5 years minimum.  If your lipid or cholesterol levels are high, you are over 40, or you are at high risk for heart  disease, you may need your cholesterol levels checked more frequently.Ongoing high lipid and cholesterol levels should be treated with medicines if diet and exercise are not working.   If you smoke, find out from your health care provider how to quit. If you do not use tobacco, do not start.   Lung cancer screening is recommended for adults aged 108-80 years who are at high risk for developing lung cancer because of a history of smoking. A yearly low-dose CT scan of the lungs is recommended for people who have at least a 30-pack-year history of smoking and are a current smoker or have quit within the past 15 years. A pack year of smoking is smoking an average of 1 pack of cigarettes a day for 1 year (for example: 1 pack a day for 30 years or 2 packs a day for 15 years). Yearly screening should continue until the smoker has stopped smoking for at least 15 years. Yearly screening should be stopped for people who develop a health problem that would prevent them from having lung cancer treatment.   If you are pregnant, do not drink alcohol. If you are breastfeeding, be very cautious about drinking alcohol. If you are not pregnant and choose to  drink alcohol, do not have more than 1 drink per day. One drink is considered to be 12 ounces (355 mL) of beer, 5 ounces (148 mL) of wine, or 1.5 ounces (44 mL) of liquor.   Avoid use of street drugs. Do not share needles with anyone. Ask for help if you need support or instructions about stopping the use of drugs.   High blood pressure causes heart disease and increases the risk of stroke. Your blood pressure should be checked at least yearly.  Ongoing high blood pressure should be treated with medicines if weight loss and exercise do not work.   If you are 69-78 years old, ask your health care provider if you should take aspirin to prevent strokes.   Diabetes screening involves taking a blood sample to check your fasting blood sugar level. This should be done once every 3 years, after age 60, if you are within normal weight and without risk factors for diabetes. Testing should be considered at a younger age or be carried out more frequently if you are overweight and have at least 1 risk factor for diabetes.   Breast cancer screening is essential preventive care for women. You should practice "breast self-awareness."  This means understanding the normal appearance and feel of your breasts and may include breast self-examination.  Any changes detected, no matter how small, should be reported to a health care provider.  Women in their 45s and 30s should have a clinical breast exam (CBE) by a health care provider as part of a regular health exam every 1 to 3 years.  After age 42, women should have a CBE every year.  Starting at age 74, women should consider having a mammogram (breast X-ray test) every year.  Women who have a family history of breast cancer should talk to their health care provider about genetic screening.  Women at a high risk of breast cancer should talk to their health care providers about having an MRI and a mammogram every year.   -Breast cancer gene (BRCA)-related cancer risk  assessment is recommended for women who have family members with BRCA-related cancers. BRCA-related cancers include breast, ovarian, tubal, and peritoneal cancers. Having family members with these cancers may be associated with an increased risk  for harmful changes (mutations) in the breast cancer genes BRCA1 and BRCA2. Results of the assessment will determine the need for genetic counseling and BRCA1 and BRCA2 testing.   The Pap test is a screening test for cervical cancer. A Pap test can show cell changes on the cervix that might become cervical cancer if left untreated. A Pap test is a procedure in which cells are obtained and examined from the lower end of the uterus (cervix).   - Women should have a Pap test starting at age 59.   - Between ages 22 and 74, Pap tests should be repeated every 2 years.   - Beginning at age 78, you should have a Pap test every 3 years as long as the past 3 Pap tests have been normal.   - Some women have medical problems that increase the chance of getting cervical cancer. Talk to your health care provider about these problems. It is especially important to talk to your health care provider if a new problem develops soon after your last Pap test. In these cases, your health care provider may recommend more frequent screening and Pap tests.   - The above recommendations are the same for women who have or have not gotten the vaccine for human papillomavirus (HPV).   - If you had a hysterectomy for a problem that was not cancer or a condition that could lead to cancer, then you no longer need Pap tests. Even if you no longer need a Pap test, a regular exam is a good idea to make sure no other problems are starting.   - If you are between ages 66 and 57 years, and you have had normal Pap tests going back 10 years, you no longer need Pap tests. Even if you no longer need a Pap test, a regular exam is a good idea to make sure no other problems are starting.   - If you  have had past treatment for cervical cancer or a condition that could lead to cancer, you need Pap tests and screening for cancer for at least 20 years after your treatment.   - If Pap tests have been discontinued, risk factors (such as a new sexual partner) need to be reassessed to determine if screening should be resumed.   - The HPV test is an additional test that may be used for cervical cancer screening. The HPV test looks for the virus that can cause the cell changes on the cervix. The cells collected during the Pap test can be tested for HPV. The HPV test could be used to screen women aged 7 years and older, and should be used in women of any age who have unclear Pap test results. After the age of 40, women should have HPV testing at the same frequency as a Pap test.   Colorectal cancer can be detected and often prevented. Most routine colorectal cancer screening begins at the age of 72 years and continues through age 61 years. However, your health care provider may recommend screening at an earlier age if you have risk factors for colon cancer. On a yearly basis, your health care provider may provide home test kits to check for hidden blood in the stool.  Use of a small camera at the end of a tube, to directly examine the colon (sigmoidoscopy or colonoscopy), can detect the earliest forms of colorectal cancer. Talk to your health care provider about this at age 43, when routine screening begins. Direct exam of  the colon should be repeated every 5 -10 years through age 30 years, unless early forms of pre-cancerous polyps or small growths are found.   People who are at an increased risk for hepatitis B should be screened for this virus. You are considered at high risk for hepatitis B if:  -You were born in a country where hepatitis B occurs often. Talk with your health care provider about which countries are considered high risk.  - Your parents were born in a high-risk country and you have not  received a shot to protect against hepatitis B (hepatitis B vaccine).  - You have HIV or AIDS.  - You use needles to inject street drugs.  - You live with, or have sex with, someone who has Hepatitis B.  - You get hemodialysis treatment.  - You take certain medicines for conditions like cancer, organ transplantation, and autoimmune conditions.   Hepatitis C blood testing is recommended for all people born from [birth year] through 1965 and any individual with known risks for hepatitis C.   Practice safe sex. Use condoms and avoid high-risk sexual practices to reduce the spread of sexually transmitted infections (STIs). STIs include gonorrhea, chlamydia, syphilis, trichomonas, herpes, HPV, and human immunodeficiency virus (HIV). Herpes, HIV, and HPV are viral illnesses that have no cure. They can result in disability, cancer, and death. Sexually active women aged 53 years and younger should be checked for chlamydia. Older women with new or multiple partners should also be tested for chlamydia. Testing for other STIs is recommended if you are sexually active and at increased risk.   Osteoporosis is a disease in which the bones lose minerals and strength with aging. This can result in serious bone fractures or breaks. The risk of osteoporosis can be identified using a bone density scan. Women ages 41 years and over and women at risk for fractures or osteoporosis should discuss screening with their health care providers. Ask your health care provider whether you should take a calcium supplement or vitamin D to There are also several preventive steps women can take to avoid osteoporosis and resulting fractures or to keep osteoporosis from worsening. -->Recommendations include:  Eat a balanced diet high in fruits, vegetables, calcium, and vitamins.  Get enough calcium. The recommended total intake of is 1,200 mg daily; for best absorption, if taking supplements, divide doses into 250-500 mg doses throughout the  day. Of the two types of calcium, calcium carbonate is best absorbed when taken with food but calcium citrate can be taken on an empty stomach.  Get enough vitamin D. [facility] and the [place] recommend at least 1,000 IU per day for women age 57 and over who are at risk of vitamin D deficiency. Vitamin D deficiency can be caused by inadequate sun exposure (for example, those who live in [place]).  Avoid alcohol and smoking. Heavy alcohol intake (more than 7 drinks per week) increases the risk of falls and hip fracture and women smokers tend to lose bone more rapidly and have lower bone mass than nonsmokers. Stopping smoking is one of the most important changes women can make to improve their health and decrease risk for disease.  Be physically active every day. Weight-bearing exercise (for example, fast walking, hiking, jogging, and weight training) may strengthen bones or slow the rate of bone loss that comes with aging. Balancing and muscle-strengthening exercises can reduce the risk of falling and fracture.  Consider therapeutic medications. Currently, several types of effective drugs are  available. Healthcare providers can recommend the type most appropriate for each woman.  Eliminate environmental factors that may contribute to accidents. Falls cause nearly 90% of all osteoporotic fractures, so reducing this risk is an important bone-health strategy. Measures include ample lighting, removing obstructions to walking, using nonskid rugs on floors, and placing mats and/or grab bars in showers.  Be aware of medication side effects. Some common medicines make bones weaker. These include a type of steroid drug called glucocorticoids used for arthritis and asthma, some antiseizure drugs, certain sleeping pills, treatments for endometriosis, and some cancer drugs. An overactive thyroid gland or using too much thyroid hormone for an underactive thyroid can also be a problem. If  you are taking these medicines, talk to your doctor about what you can do to help protect your bones.reduce the rate of osteoporosis.    Menopause can be associated with physical symptoms and risks. Hormone replacement therapy is available to decrease symptoms and risks. You should talk to your health care provider about whether hormone replacement therapy is right for you.   Use sunscreen. Apply sunscreen liberally and repeatedly throughout the day. You should seek shade when your shadow is shorter than you. Protect yourself by wearing long sleeves, pants, a wide-brimmed hat, and sunglasses year round, whenever you are outdoors.   Once a month, do a whole body skin exam, using a mirror to look at the skin on your back. Tell your health care provider of new moles, moles that have irregular borders, moles that are larger than a pencil eraser, or moles that have changed in shape or color.   -Stay current with required vaccines (immunizations).   Influenza vaccine. All adults should be immunized every year.  Tetanus, diphtheria, and acellular pertussis (Td, Tdap) vaccine. Pregnant women should receive 1 dose of Tdap vaccine during each pregnancy. The dose should be obtained regardless of the length of time since the last dose. Immunization is preferred during the 27th 36th week of gestation. An adult who has not previously received Tdap or who does not know her vaccine status should receive 1 dose of Tdap. This initial dose should be followed by tetanus and diphtheria toxoids (Td) booster doses every 10 years. Adults with an unknown or incomplete history of completing a 3-dose immunization series with Td-containing vaccines should begin or complete a primary immunization series including a Tdap dose. Adults should receive a Td booster every 10 years.  Varicella vaccine. An adult without evidence of immunity to varicella should receive 2 doses or a second dose if she has previously received 1 dose.  Pregnant females who do not have evidence of immunity should receive the first dose after pregnancy. This first dose should be obtained before leaving the health care facility. The second dose should be obtained 4 8 weeks after the first dose.  Human papillomavirus (HPV) vaccine. Females aged 67 26 years who have not received the vaccine previously should obtain the 3-dose series. The vaccine is not recommended for use in pregnant females. However, pregnancy testing is not needed before receiving a dose. If a female is found to be pregnant after receiving a dose, no treatment is needed. In that case, the remaining doses should be delayed until after the pregnancy. Immunization is recommended for any person with an immunocompromised condition through the age of 51 years if she did not get any or all doses earlier. During the 3-dose series, the second dose should be obtained 4 8 weeks after the first dose. The third  dose should be obtained 24 weeks after the first dose and 16 weeks after the second dose.  Zoster vaccine. One dose is recommended for adults aged 65 years or older unless certain conditions are present.  Measles, mumps, and rubella (MMR) vaccine. Adults born before [birth year] generally are considered immune to measles and mumps. Adults born in [birth year] or later should have 1 or more doses of MMR vaccine unless there is a contraindication to the vaccine or there is laboratory evidence of immunity to each of the three diseases. A routine second dose of MMR vaccine should be obtained at least 28 days after the first dose for students attending postsecondary schools, health care workers, or international travelers. People who received inactivated measles vaccine or an unknown type of measles vaccine during 1963 1967 should receive 2 doses of MMR vaccine. People who received inactivated mumps vaccine or an unknown type of mumps vaccine before 1979 and are at high risk for mumps infection should consider  immunization with 2 doses of MMR vaccine. For females of childbearing age, rubella immunity should be determined. If there is no evidence of immunity, females who are not pregnant should be vaccinated. If there is no evidence of immunity, females who are pregnant should delay immunization until after pregnancy. Unvaccinated health care workers born before [birth year] who lack laboratory evidence of measles, mumps, or rubella immunity or laboratory confirmation of disease should consider measles and mumps immunization with 2 doses of MMR vaccine or rubella immunization with 1 dose of MMR vaccine.  Pneumococcal 13-valent conjugate (PCV13) vaccine. When indicated, a person who is uncertain of her immunization history and has no record of immunization should receive the PCV13 vaccine. An adult aged 78 years or older who has certain medical conditions and has not been previously immunized should receive 1 dose of PCV13 vaccine. This PCV13 should be followed with a dose of pneumococcal polysaccharide (PPSV23) vaccine. The PPSV23 vaccine dose should be obtained at least 8 weeks after the dose of PCV13 vaccine. An adult aged 41 years or older who has certain medical conditions and previously received 1 or more doses of PPSV23 vaccine should receive 1 dose of PCV13. The PCV13 vaccine dose should be obtained 1 or more years after the last PPSV23 vaccine dose.  Pneumococcal polysaccharide (PPSV23) vaccine. When PCV13 is also indicated, PCV13 should be obtained first. All adults aged 31 years and older should be immunized. An adult younger than age 30 years who has certain medical conditions should be immunized. Any person who resides in a nursing home or long-term care facility should be immunized. An adult smoker should be immunized. People with an immunocompromised condition and certain other conditions should receive both PCV13 and PPSV23 vaccines. People with human immunodeficiency virus (HIV) infection should be immunized as  soon as possible after diagnosis. Immunization during chemotherapy or radiation therapy should be avoided. Routine use of PPSV23 vaccine is not recommended for American Indians, [place] Natives, or people younger than 65 years unless there are medical conditions that require PPSV23 vaccine. When indicated, people who have unknown immunization and have no record of immunization should receive PPSV23 vaccine. One-time revaccination 5 years after the first dose of PPSV23 is recommended for people aged 58 64 years who have chronic kidney failure, nephrotic syndrome, asplenia, or immunocompromised conditions. People who received 1 2 doses of PPSV23 before age 71 years should receive another dose of PPSV23 vaccine at age 21 years or later if at least 5 years have passed since the  previous dose. Doses of PPSV23 are not needed for people immunized with PPSV23 at or after age 49 years.  Meningococcal vaccine. Adults with asplenia or persistent complement component deficiencies should receive 2 doses of quadrivalent meningococcal conjugate (MenACWY-D) vaccine. The doses should be obtained at least 2 months apart. Microbiologists working with certain meningococcal bacteria, [place] recruits, people at risk during an outbreak, and people who travel to or live in countries with a high rate of meningitis should be immunized. A first-year college student up through age 25 years who is living in a residence hall should receive a dose if she did not receive a dose on or after her 16th birthday. Adults who have certain high-risk conditions should receive one or more doses of vaccine.  Hepatitis A vaccine. Adults who wish to be protected from this disease, have certain high-risk conditions, work with hepatitis A-infected animals, work in hepatitis A research labs, or travel to or work in countries with a high rate of hepatitis A should be immunized. Adults who were previously unvaccinated and who anticipate close contact with an  international adoptee during the first 60 days after arrival in the [place] from a country with a high rate of hepatitis A should be immunized.  Hepatitis B vaccine.  Adults who wish to be protected from this disease, have certain high-risk conditions, may be exposed to blood or other infectious body fluids, are household contacts or sex partners of hepatitis B positive people, are clients or workers in certain care facilities, or travel to or work in countries with a high rate of hepatitis B should be immunized.  Haemophilus influenzae type b (Hib) vaccine. A previously unvaccinated person with asplenia or sickle cell disease or having a scheduled splenectomy should receive 1 dose of Hib vaccine. Regardless of previous immunization, a recipient of a hematopoietic stem cell transplant should receive a 3-dose series 6 12 months after her successful transplant. Hib vaccine is not recommended for adults with HIV infection.  Preventive Services / Frequency Ages 52 to 39years  Blood pressure check.** / Every 1 to 2 years.  Lipid and cholesterol check.** / Every 5 years beginning at age 24.  Clinical breast exam.** / Every 3 years for women in their 52s and 2s.  BRCA-related cancer risk assessment.** / For women who have family members with a BRCA-related cancer (breast, ovarian, tubal, or peritoneal cancers).  Pap test.** / Every 2 years from ages 88 through 15. Every 3 years starting at age 44 through age 28 or 52 with a history of 3 consecutive normal Pap tests.  HPV screening.** / Every 3 years from ages 72 through ages 34 to 58 with a history of 3 consecutive normal Pap tests.  Hepatitis C blood test.** / For any individual with known risks for hepatitis C.  Skin self-exam. / Monthly.  Influenza vaccine. / Every year.  Tetanus, diphtheria, and acellular pertussis (Tdap, Td) vaccine.** / Consult your health care provider. Pregnant women should receive 1 dose of Tdap vaccine during  each pregnancy. 1 dose of Td every 10 years.  Varicella vaccine.** / Consult your health care provider. Pregnant females who do not have evidence of immunity should receive the first dose after pregnancy.  HPV vaccine. / 3 doses over 6 months, if 90 and younger. The vaccine is not recommended for use in pregnant females. However, pregnancy testing is not needed before receiving a dose.  Measles, mumps, rubella (MMR) vaccine.** / You need at least 1 dose of MMR  if you were born in 1957 or later. You may also need a 2nd dose. For females of childbearing age, rubella immunity should be determined. If there is no evidence of immunity, females who are not pregnant should be vaccinated. If there is no evidence of immunity, females who are pregnant should delay immunization until after pregnancy.  Pneumococcal 13-valent conjugate (PCV13) vaccine.** / Consult your health care provider.  Pneumococcal polysaccharide (PPSV23) vaccine.** / 1 to 2 doses if you smoke cigarettes or if you have certain conditions.  Meningococcal vaccine.** / 1 dose if you are age 35 to 62 years and a Market researcher living in a residence hall, or have one of several medical conditions, you need to get vaccinated against meningococcal disease. You may also need additional booster doses.  Hepatitis A vaccine.** / Consult your health care provider.  Hepatitis B vaccine.** / Consult your health care provider.  Haemophilus influenzae type b (Hib) vaccine.** / Consult your health care provider.  Ages 53 to 64years  Blood pressure check.** / Every 1 to 2 years.  Lipid and cholesterol check.** / Every 5 years beginning at age 27 years.  Lung cancer screening. / Every year if you are aged 27 80 years and have a 30-pack-year history of smoking and currently smoke or have quit within the past 15 years. Yearly screening is stopped once you have quit smoking for at least 15 years or develop a health problem that would  prevent you from having lung cancer treatment.  Clinical breast exam.** / Every year after age 41 years.  BRCA-related cancer risk assessment.** / For women who have family members with a BRCA-related cancer (breast, ovarian, tubal, or peritoneal cancers).  Mammogram.** / Every year beginning at age 42 years and continuing for as long as you are in good health. Consult with your health care provider.  Pap test.** / Every 3 years starting at age 87 years through age 53 or 35 years with a history of 3 consecutive normal Pap tests.  HPV screening.** / Every 3 years from ages 62 years through ages 33 to 72 years with a history of 3 consecutive normal Pap tests.  Fecal occult blood test (FOBT) of stool. / Every year beginning at age 17 years and continuing until age 48 years. You may not need to do this test if you get a colonoscopy every 10 years.  Flexible sigmoidoscopy or colonoscopy.** / Every 5 years for a flexible sigmoidoscopy or every 10 years for a colonoscopy beginning at age 74 years and continuing until age 60 years.  Hepatitis C blood test.** / For all people born from [birth year] through 1965 and any individual with known risks for hepatitis C.  Skin self-exam. / Monthly.  Influenza vaccine. / Every year.  Tetanus, diphtheria, and acellular pertussis (Tdap/Td) vaccine.** / Consult your health care provider. Pregnant women should receive 1 dose of Tdap vaccine during each pregnancy. 1 dose of Td every 10 years.  Varicella vaccine.** / Consult your health care provider. Pregnant females who do not have evidence of immunity should receive the first dose after pregnancy.  Zoster vaccine.** / 1 dose for adults aged 43 years or older.  Measles, mumps, rubella (MMR) vaccine.** / You need at least 1 dose of MMR if you were born in 1957 or later. You may also need a 2nd dose. For females of childbearing age, rubella immunity should be determined. If there is no evidence of immunity, females who  are not pregnant should be  vaccinated. If there is no evidence of immunity, females who are pregnant should delay immunization until after pregnancy.  Pneumococcal 13-valent conjugate (PCV13) vaccine.** / Consult your health care provider.  Pneumococcal polysaccharide (PPSV23) vaccine.** / 1 to 2 doses if you smoke cigarettes or if you have certain conditions.  Meningococcal vaccine.** / Consult your health care provider.  Hepatitis A vaccine.** / Consult your health care provider.  Hepatitis B vaccine.** / Consult your health care provider.  Haemophilus influenzae type b (Hib) vaccine.** / Consult your health care provider.  Ages 34 years and over  Blood pressure check.** / Every 1 to 2 years.  Lipid and cholesterol check.** / Every 5 years beginning at age 85 years.  Lung cancer screening. / Every year if you are aged 18 80 years and have a 30-pack-year history of smoking and currently smoke or have quit within the past 15 years. Yearly screening is stopped once you have quit smoking for at least 15 years or develop a health problem that would prevent you from having lung cancer treatment.  Clinical breast exam.** / Every year after age 67 years.  BRCA-related cancer risk assessment.** / For women who have family members with a BRCA-related cancer (breast, ovarian, tubal, or peritoneal cancers).  Mammogram.** / Every year beginning at age 25 years and continuing for as long as you are in good health. Consult with your health care provider.  Pap test.** / Every 3 years starting at age 4 years through age 12 or 63 years with 3 consecutive normal Pap tests. Testing can be stopped between 65 and 70 years with 3 consecutive normal Pap tests and no abnormal Pap or HPV tests in the past 10 years.  HPV screening.** / Every 3 years from ages 46 years through ages 73 or 41 years with a history of 3 consecutive normal Pap tests. Testing can be stopped between 65 and 70 years with 3 consecutive  normal Pap tests and no abnormal Pap or HPV tests in the past 10 years.  Fecal occult blood test (FOBT) of stool. / Every year beginning at age 34 years and continuing until age 30 years. You may not need to do this test if you get a colonoscopy every 10 years.  Flexible sigmoidoscopy or colonoscopy.** / Every 5 years for a flexible sigmoidoscopy or every 10 years for a colonoscopy beginning at age 60 years and continuing until age 72 years.  Hepatitis C blood test.** / For all people born from [birth year] through 1965 and any individual with known risks for hepatitis C.  Osteoporosis screening.** / A one-time screening for women ages 72 years and over and women at risk for fractures or osteoporosis.  Skin self-exam. / Monthly.  Influenza vaccine. / Every year.  Tetanus, diphtheria, and acellular pertussis (Tdap/Td) vaccine.** / 1 dose of Td every 10 years.  Varicella vaccine.** / Consult your health care provider.  Zoster vaccine.** / 1 dose for adults aged 48 years or older.  Pneumococcal 13-valent conjugate (PCV13) vaccine.** / Consult your health care provider.  Pneumococcal polysaccharide (PPSV23) vaccine.** / 1 dose for all adults aged 78 years and older.  Meningococcal vaccine.** / Consult your health care provider.  Hepatitis A vaccine.** / Consult your health care provider.  Hepatitis B vaccine.** / Consult your health care provider.  Haemophilus influenzae type b (Hib) vaccine.** / Consult your health care provider. ** Family history and personal history of risk and conditions may change your health care provider's recommendations. Document Released: 04/14/2001  Document Revised: 12/07/2012  [facility] Patient Information 2014 [place].   EXERCISE AND DIET:  We recommended that you start or continue a regular exercise program for good health. Regular exercise means any activity that makes your heart beat faster and makes you sweat.  We recommend exercising at least 30  minutes per day at least 3 days a week, preferably 5.  We also recommend a diet low in fat and sugar / carbohydrates.  Inactivity, poor dietary choices and obesity can cause diabetes, heart attack, stroke, and kidney damage, among others.     ALCOHOL AND SMOKING:  Women should limit their alcohol intake to no more than 7 drinks/beers/glasses of wine (combined, not each!) per week. Moderation of alcohol intake to this level decreases your risk of breast cancer and liver damage.  ( And of course, no recreational drugs are part of a healthy lifestyle.)  Also, you should not be smoking at all or even being exposed to second hand smoke. Most people know smoking can cause cancer, and various heart and lung diseases, but did you know it also contributes to weakening of your bones?  Aging of your skin?  Yellowing of your teeth and nails?   CALCIUM AND VITAMIN D:  Adequate intake of calcium and Vitamin D are recommended.  The recommendations for exact amounts of these supplements seem to change often, but generally speaking 600 mg of calcium (either carbonate or citrate) and 800 units of Vitamin D per day seems prudent. Certain women may benefit from higher intake of Vitamin D.  If you are among these women, your doctor will have told you during your visit.     PAP SMEARS:  Pap smears, to check for cervical cancer or precancers,  have traditionally been done yearly, although recent scientific advances have shown that most women can have pap smears less often.  However, every woman still should have a physical exam from her gynecologist or primary care physician every year. It will include a breast check, inspection of the vulva and vagina to check for abnormal growths or skin changes, a visual exam of the cervix, and then an exam to evaluate the size and shape of the uterus and ovaries.  And after 66 years of age, a rectal exam is indicated to check for rectal cancers. We will also provide age appropriate advice  regarding health maintenance, like when you should have certain vaccines, screening for sexually transmitted diseases, bone density testing, colonoscopy, mammograms, etc.    MAMMOGRAMS:  All women over 64 years old should have a yearly mammogram. Many facilities now offer a "3D" mammogram, which may cost around $50 extra out of pocket. If possible,  we recommend you accept the option to have the 3D mammogram performed.  It both reduces the number of women who will be called back for extra views which then turn out to be normal, and it is better than the routine mammogram at detecting truly abnormal areas.     COLONOSCOPY:  Colonoscopy to screen for colon cancer is recommended for all women at age 71.  We know, you hate the idea of the prep.  We agree, BUT, having colon cancer and not knowing it is worse!!  Colon cancer so often starts as a polyp that can be seen and removed at colonscopy, which can quite literally save your life!  And if your first colonoscopy is normal and you have no family history of colon cancer, most women don't have to have it again  for 10 years.  Once every ten years, you can do something that may end up saving your life, right?  We will be happy to help you get it scheduled when you are ready.  Be sure to check your insurance coverage so you understand how much it will cost.  It may be covered as a preventative service at no cost, but you should check your particular policy.

## 2016-08-17 NOTE — Assessment & Plan Note (Signed)
Refill of Zetia given.  Diet and lifestyle modifications discussed with patient

## 2016-08-17 NOTE — Progress Notes (Signed)
Pt here for an acute care OV today  &  a Annual Medicare Wellness Exam   Impression and Recommendations:    2. Dyslipidemia E78.5 ezetimibe (ZETIA) 10 MG tablet  3. Abnormality of heart beat-  R00.9   4. Essential hypertension I10   5. Hx of cardiovascular stress test Z92.89   6. Acute reaction to situational stress F43.0   7. GAD (generalized anxiety disorder) F41.1      Abnormality of heart beat-   -  EKG performed in office today.  - NO change from ekg's in 2012, 2013, 2014.  - PR interval mildly increased over 0.2sec,  - otherwise non-sp st changes, but not indicative of 68mm elevation s-t segment in II, III, AVF, nor reciprocal changes   - Symptoms were precipitated and appeared to be emotionally mediated secondary to stress from her husband  - Red flag symptoms were discussed with patient and if she develops any, she will dial 911 or let us know immediately.   GAD (generalized anxiety disorder) Patient would like to avoid medications at all cost.  She acknowledges she has anxiety and excess worry about things that is negatively affecting her health but declines medications at this time.  - Encouraged life coach\ counselor    Acute reaction to situational stress Symptoms appeared to have been related to a panic type attacks.  - Deep breathing / square breathing reviewed with patient  - Exercise daily to goal of 30 minutes  - Stress management techniques discussed  Dyslipidemia Refill of Zetia given.  Diet and lifestyle modifications discussed with patient  Essential hypertension Currently well controlled.  Improved since starting hydrochlorothiazide last office visit on 5\9  - Continue current medications.  - Explained acute stress reaction will cause blood pressure and heart rate elevation.  However, it should never cause chest pain, shortness of breath, heart palpitations, etc.  The patient was counseled, risk factors were discussed, anticipatory  guidance given.  Gross side effects, risk and benefits, and alternatives of treatment plan in general discussed with patient.  Patient will call with any questions prior to if they have concerns.  Expresses verbal understanding and consents to current treatment plan.  No barriers to understanding were identified.  Red flag symptoms and signs discussed in detail.  Patient expressed understanding regarding what to do in case of emergency\urgent symptoms  Please see AVS handed out to patient at the end of our visit for further patient instructions/ counseling done pertaining to today's office visit.   Return in about 4 months (around 12/17/2016) for Come in for chronic disease management and follow-up.  Come fasting for bldwrk.     Note: This document was prepared using Dragon voice recognition software and may include unintentional dictation errors.  Mellody Dance 7:51 PM     Subjective:    CC:  Chief Complaint  Patient presents with  . Annual Exam  . Heart Problem    HPI: Bailey [name] is a 66 y.o. female who presents to [place] at [facility] today for issues as discussed below.   1)  HTN-  Patient tolerating hydrochlorothiazide that was added on last office visit.  Blood pressures have been under good control at home in the 125-135/ 80s range consistently.    2) anxious-  patient complains of increasing worry over her and her husband's discourse lately.  She is very against medications but describes being increasingly irritated with him regarding multiple situations, and more tearful than  usual.   She has worry out of proportion to what would be usual or customary in many circumstances.  She has been walking a little more lately and thinks she can improve her mood through diet and exercise.  3)  Abnormality of heart beat-    Patient today also has concerns that last night she started feeling overwhelmed when getting home from the Father's Day celebration at  her daughter's house.  When she got home she felt anxious, like her heart beats were very strong-not irregular or racing.  But they felt prominent.  She felt like it was difficult to catch her breath.  She denies she was particularly anxious at that time.  She denied any chest pain, shortness of breath, pleuritic pain, dizziness, nausea or vomiting, jaw pain, shoulder pain etc.  She was completely asymptomatic except for what was mentioned above.   Patient does state she has been feeling overwhelmed and a little stressed lately with Adron Bene her husband.  He smokes in the house and this really bothers her.  She said she's constantly nagging him.  He made a huge garden this year and patient is stressed out about how much work it will take to harvest it and canned vegetables etc.     Every hour she was checking her blood pressure and heart rate from 7 PM till midnight.  Her blood pressure was elevated.  At 10:30 her blood pressure was 172/119 with a heart rate of 125, at 11:30 blood pressure was 139/101 and a heart rate of 122, 2:30 AM blood pressure was 136/87 with a heart rate of 81 and this morning she felt back to normal at 7 AM with blood pressure 125/84 and a heart rate of 64.  Of note, patient recently started Weight Watchers again just last week.  She also started walking 1 mile every day for one week now.  This takes her 25 minutes and she has done it daily.  She has absolutely no symptoms of heart irregularities, chest pain, shortness of breath etc. with activity.      Wt Readings from Last 3 Encounters:  08/17/16 198 lb 12.8 oz (90.2 kg)  07/08/16 202 lb 3.2 oz (91.7 kg)  03/13/16 199 lb (90.3 kg)   BP Readings from Last 3 Encounters:  08/17/16 127/86  07/08/16 (!) 172/98  03/13/16 (!) 143/88   BMI Readings from Last 3 Encounters:  08/17/16 32.09 kg/m  07/08/16 32.64 kg/m  03/13/16 32.12 kg/m     Patient Care Team    Relationship Specialty Notifications Start End    Mellody Dance, DO PCP - General Family Medicine  12/05/15   Marchia Bond, MD  Orthopedic Surgery  04/07/12   Ladene Artist, MD  Gastroenterology  07/27/12      Patient Active Problem List   Diagnosis Date Noted  . Obesity (BMI 30-39.9) 06/22/2013    Priority: High  . Dyslipidemia 09/27/2008    Priority: High  . Essential hypertension 06/28/2008    Priority: High  . Chronic Fatigue 07/27/2014    Priority: Medium  . Chronic constipation- sees GI 07/27/2014    Priority: Medium  . Hx of cardiovascular stress test 12/22/2015    Priority: Low  . Vitamin D deficiency 12/22/2015    Priority: Low  . 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 Abnormality of heart beat-  08/17/2016  . Acute reaction to situational stress 08/17/2016  . GAD (generalized anxiety disorder) 08/17/2016  . Hematuria 03/13/2016  .  Pelvic pressure in female 03/13/2016  . Osteoarthritis of knee, unspecified 10/29/2014  . Rectus diastasis 08/09/2013  . Obesity 06/22/2013  . Gallstone 07/27/2012  . Acute diverticulitis 07/27/2012  . Partial tear of subscapularis tendon 04/22/2012  . Supraspinatus tendon tear 04/22/2012  . Trochanteric bursitis of right hip   . Asymptomatic postmenopausal status 01/04/2009  . Hyperlipidemia 09/27/2008    Past Medical history, Surgical history, Family history, Social history, Allergies and Medications have been entered into the medical record, reviewed and changed as needed.    Current Meds  Medication Sig  . aspirin EC 81 MG tablet Take 1 tablet (81 mg total) by mouth daily.  Marland Kitchen ezetimibe (ZETIA) 10 MG tablet Take 1 tablet (10 mg total) by mouth daily.  . hydrochlorothiazide (HYDRODIURIL) 25 MG tablet Take 1 tablet (25 mg total) by mouth daily. (Patient taking differently: Take 25 mg by mouth daily. 1/2 tablet daily)  . hydroxychloroquine (PLAQUENIL) 200 MG tablet Take 200 mg by mouth daily.  Marland Kitchen olmesartan (BENICAR) 40 MG tablet Take 1 tablet (40 mg total) by mouth  daily.  . [DISCONTINUED] ezetimibe (ZETIA) 10 MG tablet Take 1 tablet by mouth daily.    Allergies:  Allergies  Allergen Reactions  . Codeine Nausea Only  . Statins   . Benicar [Olmesartan] Other (See Comments)    ?able hair thinning     Review of Systems: General:   Denies fever, chills, unexplained weight loss.  Optho/Auditory:   Denies visual changes, blurred vision/LOV Respiratory:   Denies wheeze, DOE more than baseline levels.  Cardiovascular:   Denies chest pain, palpitations, new onset peripheral edema  Gastrointestinal:   Denies nausea, vomiting, diarrhea, abd pain.  Genitourinary: Denies dysuria, freq/ urgency, flank pain or discharge from genitals.  Endocrine:     Denies hot or cold intolerance, polyuria, polydipsia. Musculoskeletal:   Denies unexplained myalgias, joint swelling, unexplained arthralgias, gait problems.  Skin:  Denies new onset rash, suspicious lesions Neurological:     Denies dizziness, unexplained weakness, numbness  Psychiatric/Behavioral:   Denies mood changes, suicidal or homicidal ideations, hallucinations    Objective:   Blood pressure 127/86, pulse 65, height 5\' 6"  (1.676 m), weight 198 lb 12.8 oz (90.2 kg). Body mass index is 32.09 kg/m. General:  Well Developed, well nourished, appropriate for stated age.  Neuro:  Alert and oriented,  extra-ocular muscles intact  HEENT:  Normocephalic, atraumatic, neck supple, no carotid bruits bilaterally Skin:  no gross rash, warm, pink. Cardiac:  RRR, S1 S2 Respiratory:  ECTA B/L and A/P, Not using accessory muscles, speaking in full sentences- unlabored. Vascular:  Ext warm, no cyanosis apprec.; cap RF less 2 sec. Psych:  No HI/SI, judgement and insight good, Euthymic mood. Full Affect.  ________________________________________________________________________________   Annual Medicare wellness exam:     6CIT Screen 08/17/2016  What Year? 0 points  What month? 0 points  What time? 0 points    Count back from 20 0 points  Months in reverse 0 points  Repeat phrase 4 points  Total Score 4     Subjective:   Bailey [name] is a 66 y.o. female who presents for Medicare Annual (Subsequent) preventive examination.  Has GYN provider.  Obtains Pap smears, mammograms and bone densities.  Review of Systems:  Review of Systems: General:   Denies fever, chills, unexplained weight loss.  Optho/Auditory:   Denies visual changes, blurred vision/LOV Respiratory:   Denies SOB, DOE more than baseline levels.  Cardiovascular:   Denies chest pain,  palpitations, new onset peripheral edema, SOB, DIB  Gastrointestinal:   Denies nausea, vomiting, diarrhea.  Genitourinary: Denies dysuria, freq/ urgency, flank pain or discharge from genitals.  Endocrine:     Denies hot or cold intolerance, polyuria, polydipsia. Musculoskeletal:   Denies unexplained myalgias, joint swelling, unexplained arthralgias, gait problems.  Skin:  Denies rash, suspicious lesions Neurological:     Denies dizziness, unexplained weakness, numbness  Psychiatric/Behavioral:   Denies mood changes, suicidal or homicidal ideations, hallucinations   Fall Risk  08/17/2016 12/05/2015  Falls in the past year? No Yes  Number falls in past yr: - 1  Injury with Fall? - No    Depression screen [facility] 2/9 08/17/2016 12/05/2015  Decreased Interest 0 0  Down, Depressed, Hopeless 0 0  PHQ - 2 Score 0 0    6CIT Screen 08/17/2016  What Year? 0 points  What month? 0 points  What time? 0 points  Count back from 20 0 points  Months in reverse 0 points  Repeat phrase 4 points  Total Score 4      Objective:     Vitals: BP 127/86   Pulse 65   Ht 5\' 6"  (1.676 m)   Wt 198 lb 12.8 oz (90.2 kg)   BMI 32.09 kg/m   Body mass index is 32.09 kg/m.   Tobacco History  Smoking Status  . Never Smoker  Smokeless Tobacco  . Never Used    Comment: exposed to second hand (spouse smokes), Married     Counseling given: Not  Answered   Past Medical History:  Diagnosis Date  . Arthritis   . Colitis    hx colitis-gi bleed-2006  . DYSLIPIDEMIA   . Hx of cardiovascular stress test    Lexiscan Myoview 9/14:  Small, fixed apical anterior perfusion defect (worse at rest) - probable soft tissue attenuation, EF 61%, low risk study  . HYPERTENSION   . Partial tear of subscapularis tendon 04/22/2012  . POSTMENOPAUSAL STATUS   . Supraspinatus tendon tear 04/22/2012   Past Surgical History:  Procedure Laterality Date  . ABDOMINAL HYSTERECTOMY  1987   Partial  . APPENDECTOMY  2003  . CERVICAL FUSION  2002  . COLONOSCOPY  06/03/04   isch colitis (hosp for same)  . HERNIA REPAIR  1975   umb  . SHOULDER ARTHROSCOPY WITH ROTATOR CUFF REPAIR AND SUBACROMIAL DECOMPRESSION Right 04/22/2012   Procedure: SHOULDER ARTHROSCOPY WITH ROTATOR CUFF REPAIR AND SUBACROMIAL DECOMPRESSION;  Surgeon: Johnny Bridge, MD;  Location: [place];  Service: Orthopedics;  Laterality: Right;  RIGHT SHOULDER ARTHROSCOPY DEBRIDEMENT LIMITED, DECOMPRESSION SUBACROMIAL PARTIAL ACROMIOPLASTY WITH CORACOACROMIAL RELEASE, WITH ROTATOR CUFF REPAIR AND SUBSCAPULARIS REPAIR  . TUBAL LIGATION     Family History  Problem Relation Age of Onset  . Hypertension Father   . Stroke Father   . Aneurysm Mother        brain  . Stroke Sister   . Hypertension Sister   . Diabetes Brother    History  Sexual Activity  . Sexual activity: Yes  . Birth control/ protection: Surgical    Outpatient Encounter Prescriptions as of 08/17/2016  Medication Sig  . aspirin EC 81 MG tablet Take 1 tablet (81 mg total) by mouth daily.  Marland Kitchen ezetimibe (ZETIA) 10 MG tablet Take 1 tablet (10 mg total) by mouth daily.  . hydrochlorothiazide (HYDRODIURIL) 25 MG tablet Take 1 tablet (25 mg total) by mouth daily. (Patient taking differently: Take 25 mg by mouth daily. 1/2 tablet daily)  .  hydroxychloroquine (PLAQUENIL) 200 MG tablet Take 200 mg by mouth daily.  Marland Kitchen  olmesartan (BENICAR) 40 MG tablet Take 1 tablet (40 mg total) by mouth daily.  . [DISCONTINUED] ezetimibe (ZETIA) 10 MG tablet Take 1 tablet by mouth daily.   No facility-administered encounter medications on file as of 08/17/2016.      Activities of Daily Living In your present state of health, do you have difficulty performing the following activities?  1- Driving - no 2- Managing money - no 3- Feeding yourself - no 4- Getting from the bed to the chair - no 5- Climbing a flight of stairs - no 6- Preparing food and eating - no 7- Bathing or showering - no 8- Getting dressed - no 9- Getting to the toilet - no 10- Using the toilet - no 11- Moving around from place to place - no   Patient Care Team: Mellody Dance, DO as PCP - General (Family Medicine) Marchia Bond, MD (Orthopedic Surgery) Ladene Artist, MD (Gastroenterology)    Assessment:      ICD-10-CM   1. Encounter for Medicare annual wellness exam Z00.00    Current Exercise Habits: Home exercise routine (walking 84min daily * 1 week), Type of exercise: walking  Goals    None     Fall Risk Fall Risk  08/17/2016 12/05/2015  Falls in the past year? No Yes  Number falls in past yr: - 1  Injury with Fall? - No   Depression Screen PHQ 2/9 Scores 08/17/2016 12/05/2015  PHQ - 2 Score 0 0     Cognitive Function     6CIT Screen 08/17/2016  What Year? 0 points  What month? 0 points  What time? 0 points  Count back from 20 0 points  Months in reverse 0 points  Repeat phrase 4 points  Total Score 4    Immunization History  Administered Date(s) Administered  . Td 01/04/2009  . Tdap 03/02/2012   Screening Tests Health Maintenance  Topic Date Due  . MAMMOGRAM  06/30/2017 (Originally 07/13/2015)  . Hepatitis C Screening  06/30/2017 (Originally [birth year])  . PNA vac Low Risk Adult (1 of 2 - PCV13) 06/30/2017 (Originally 08/12/2015)  . INFLUENZA VACCINE  09/30/2016  . COLONOSCOPY  09/06/2018  . TETANUS/TDAP   03/02/2022  . DEXA SCAN  Completed      Plan:   Exercise Activities and Dietary recommendations Please see AVS for Additional details on health recommendations  I have personally reviewed and noted the following in the patient's chart:   . Medical and social history . Use of alcohol, tobacco or illicit drugs  . Current medications and supplements . Functional ability and status . Nutritional status . Physical activity . Advanced directives . List of other physicians . Hospitalizations, surgeries, and ER visits in previous 12 months . Vitals . Screenings to include cognitive, depression, and falls . Referrals and appointments  In addition, I have reviewed and discussed with patient certain preventive protocols, quality metrics, and best practice recommendations. A written personalized care plan for preventive services as well as general preventive health recommendations were provided to patient.     Mellody Dance, DO  08/17/2016

## 2016-08-17 NOTE — Assessment & Plan Note (Addendum)
-    EKG performed in office today.  - NO change from ekg's in 2012, 2013, 2014.  - PR interval mildly increased over 0.2sec,  - otherwise non-sp st changes, but not indicative of 11mm elevation s-t segment in II, III, AVF, nor reciprocal changes   - Symptoms were precipitated and appeared to be emotionally mediated secondary to stress from her husband  - Red flag symptoms were discussed with patient and if she develops any, she will dial 911 or let us know immediately.

## 2016-08-18 NOTE — Addendum Note (Signed)
Addended by: Lanier Prude D on: 08/18/2016 09:55 AM   Modules accepted: Orders

## 2016-08-19 DIAGNOSIS — Z1231 Encounter for screening mammogram for malignant neoplasm of breast: Secondary | ICD-10-CM | POA: Diagnosis not present

## 2016-08-19 DIAGNOSIS — Z1389 Encounter for screening for other disorder: Secondary | ICD-10-CM | POA: Diagnosis not present

## 2016-08-19 DIAGNOSIS — Z01419 Encounter for gynecological examination (general) (routine) without abnormal findings: Secondary | ICD-10-CM | POA: Diagnosis not present

## 2016-10-14 DIAGNOSIS — L661 Lichen planopilaris: Secondary | ICD-10-CM | POA: Diagnosis not present

## 2016-10-14 DIAGNOSIS — Z79899 Other long term (current) drug therapy: Secondary | ICD-10-CM | POA: Diagnosis not present

## 2016-12-14 ENCOUNTER — Telehealth: Payer: Self-pay | Admitting: Family Medicine

## 2016-12-14 NOTE — Telephone Encounter (Signed)
Patient wants to talk to someone about her meds and if she needs to continue on these meds, and has some questions about her upcoming appt

## 2016-12-14 NOTE — Telephone Encounter (Signed)
Spoke to patient, made appointment for labs on 12/17/2016 and office visit with Dr Raliegh Scarlet for 12/21/2016.  Patient would like to discuss cholesterol and medication refill.  MPulliam, CMA/RT(R)

## 2016-12-17 ENCOUNTER — Other Ambulatory Visit (INDEPENDENT_AMBULATORY_CARE_PROVIDER_SITE_OTHER): Payer: PPO

## 2016-12-17 DIAGNOSIS — M159 Polyosteoarthritis, unspecified: Secondary | ICD-10-CM | POA: Diagnosis not present

## 2016-12-17 DIAGNOSIS — E785 Hyperlipidemia, unspecified: Secondary | ICD-10-CM

## 2016-12-17 DIAGNOSIS — E669 Obesity, unspecified: Secondary | ICD-10-CM | POA: Diagnosis not present

## 2016-12-17 DIAGNOSIS — R5383 Other fatigue: Secondary | ICD-10-CM

## 2016-12-17 DIAGNOSIS — E559 Vitamin D deficiency, unspecified: Secondary | ICD-10-CM

## 2016-12-17 DIAGNOSIS — I10 Essential (primary) hypertension: Secondary | ICD-10-CM | POA: Diagnosis not present

## 2016-12-18 LAB — COMPREHENSIVE METABOLIC PANEL
A/G RATIO: 1.5 (ref 1.2–2.2)
ALK PHOS: 69 IU/L (ref 39–117)
ALT: 16 IU/L (ref 0–32)
AST: 16 IU/L (ref 0–40)
Albumin: 4.3 g/dL (ref 3.6–4.8)
BILIRUBIN TOTAL: 0.6 mg/dL (ref 0.0–1.2)
BUN/Creatinine Ratio: 22 (ref 12–28)
BUN: 16 mg/dL (ref 8–27)
CHLORIDE: 102 mmol/L (ref 96–106)
CO2: 25 mmol/L (ref 20–29)
Calcium: 10.2 mg/dL (ref 8.7–10.3)
Creatinine, Ser: 0.74 mg/dL (ref 0.57–1.00)
GFR calc Af Amer: 98 mL/min/{1.73_m2} (ref >59)
GFR calc non Af Amer: 85 mL/min/{1.73_m2} (ref >59)
GLOBULIN, TOTAL: 2.8 g/dL (ref 1.5–4.5)
Glucose: 114 mg/dL — ABNORMAL HIGH (ref 65–99)
POTASSIUM: 4.2 mmol/L (ref 3.5–5.2)
SODIUM: 140 mmol/L (ref 134–144)
Total Protein: 7.1 g/dL (ref 6.0–8.5)

## 2016-12-18 LAB — LIPID PANEL
CHOL/HDL RATIO: 3.5 ratio (ref 0.0–4.4)
Cholesterol, Total: 190 mg/dL (ref 100–199)
HDL: 55 mg/dL (ref >39)
LDL Calculated: 115 mg/dL — ABNORMAL HIGH (ref 0–99)
Triglycerides: 102 mg/dL (ref 0–149)
VLDL Cholesterol Cal: 20 mg/dL (ref 5–40)

## 2016-12-18 LAB — TSH: TSH: 1.74 u[IU]/mL (ref 0.450–4.500)

## 2016-12-18 LAB — VITAMIN B12: VITAMIN B 12: 425 pg/mL (ref 232–1245)

## 2016-12-18 LAB — HEMOGLOBIN A1C
Est. average glucose Bld gHb Est-mCnc: 108 mg/dL
HEMOGLOBIN A1C: 5.4 % (ref 4.8–5.6)

## 2016-12-18 LAB — VITAMIN D 25 HYDROXY (VIT D DEFICIENCY, FRACTURES): VIT D 25 HYDROXY: 29.1 ng/mL — AB (ref 30.0–100.0)

## 2016-12-21 ENCOUNTER — Ambulatory Visit (INDEPENDENT_AMBULATORY_CARE_PROVIDER_SITE_OTHER): Payer: PPO | Admitting: Family Medicine

## 2016-12-21 ENCOUNTER — Encounter: Payer: Self-pay | Admitting: Family Medicine

## 2016-12-21 VITALS — BP 150/90 | HR 67 | Ht 66.0 in | Wt 196.0 lb

## 2016-12-21 DIAGNOSIS — M25561 Pain in right knee: Secondary | ICD-10-CM | POA: Diagnosis not present

## 2016-12-21 DIAGNOSIS — E669 Obesity, unspecified: Secondary | ICD-10-CM | POA: Diagnosis not present

## 2016-12-21 DIAGNOSIS — R5383 Other fatigue: Secondary | ICD-10-CM | POA: Diagnosis not present

## 2016-12-21 DIAGNOSIS — G8929 Other chronic pain: Secondary | ICD-10-CM

## 2016-12-21 DIAGNOSIS — E785 Hyperlipidemia, unspecified: Secondary | ICD-10-CM

## 2016-12-21 DIAGNOSIS — I10 Essential (primary) hypertension: Secondary | ICD-10-CM | POA: Diagnosis not present

## 2016-12-21 DIAGNOSIS — E559 Vitamin D deficiency, unspecified: Secondary | ICD-10-CM

## 2016-12-21 MED ORDER — EZETIMIBE 10 MG PO TABS: 10.0000 mg | ORAL_TABLET | Freq: Every day | ORAL | 1 refills | Status: DC

## 2016-12-21 MED ORDER — HYDROCHLOROTHIAZIDE 12.5 MG PO TABS: 12.5000 mg | ORAL_TABLET | Freq: Every day | ORAL | 1 refills | Status: DC

## 2016-12-21 MED ORDER — VITAMIN D (ERGOCALCIFEROL) 1.25 MG (50000 UNIT) PO CAPS: 50000.0000 [IU] | ORAL_CAPSULE | ORAL | 10 refills | Status: DC

## 2016-12-21 MED ORDER — OLMESARTAN MEDOXOMIL 40 MG PO TABS: 40.0000 mg | ORAL_TABLET | Freq: Every day | ORAL | 1 refills | Status: DC

## 2016-12-21 NOTE — Progress Notes (Signed)
Assessment and plan:  1. Vitamin D deficiency   2. Dyslipidemia   3. Essential hypertension   4. Obesity (BMI 30-39.9)   5. Chronic pain of right knee   6. Other fatigue     Meds ordered this encounter  Medications  . Vitamin D, Ergocalciferol, (DRISDOL) 50000 units CAPS capsule    Sig: Take 1 capsule (50,000 Units total) by mouth every 7 (seven) days.    Dispense:  12 capsule    Refill:  10  . hydrochlorothiazide (HYDRODIURIL) 12.5 MG tablet    Sig: Take 1 tablet (12.5 mg total) by mouth daily.    Dispense:  90 tablet    Refill:  1  . olmesartan (BENICAR) 40 MG tablet    Sig: Take 1 tablet (40 mg total) by mouth daily.    Dispense:  90 tablet    Refill:  1  . ezetimibe (ZETIA) 10 MG tablet    Sig: Take 1 tablet (10 mg total) by mouth daily.    Dispense:  90 tablet    Refill:  1   Orders Placed This Encounter  Procedures  . CBC with Differential/Platelet  . VITAMIN D 25 Hydroxy (Vit-D Deficiency, Fractures)  . TSH  . T4, free    Return for follow up every 3-4 mo,.  Anticipatory guidance and routine counseling done re: condition, txmnt options and need for follow up. All questions of patient's were answered.   Gross side effects, risk and benefits, and alternatives of medications discussed with patient.  Patient is aware that all medications have potential side effects and we are unable to predict every sideeffect or drug-drug interaction that may occur.  Expresses verbal understanding and consents to current therapy plan and treatment regiment.  Please see AVS handed out to patient at the end of our visit for additional patient instructions/ counseling done pertaining to today's office visit.  Note: This document was prepared using Dragon voice recognition software and may include unintentional dictation  errors.   ----------------------------------------------------------------------------------------------------------------------  Subjective:   CC:   Bailey [name] is a 66 y.o. female who presents to [place] [facility] today for review and discussion of recent bloodwork that was done.  1. All recent blood work that we ordered was reviewed with patient today.  Patient was counseled on all abnormalities and we discussed dietary and lifestyle changes that could help those values (also medications when appropriate).  Extensive health counseling performed and all patient's concerns/ questions were addressed.  2. bp at home 120's-130's/ 75-85.  No sx.  Feeling well.  Needs RF's of Bp meds.      Wt Readings from Last 3 Encounters:  12/21/16 196 lb (88.9 kg)  08/17/16 198 lb 12.8 oz (90.2 kg)  07/08/16 202 lb 3.2 oz (91.7 kg)   BP Readings from Last 3 Encounters:  12/21/16 (!) 150/90  08/17/16 127/86  07/08/16 (!) 172/98   Pulse Readings from Last 3 Encounters:  12/21/16 67  08/17/16 65  07/08/16 (!) 58   BMI Readings from Last 3 Encounters:  12/21/16 31.64 kg/m  08/17/16 32.09 kg/m  07/08/16 32.64 kg/m     Patient Care Team    Relationship Specialty Notifications Start End  Mellody Dance, DO PCP - General Family Medicine  12/05/15   Marchia Bond, MD  Orthopedic Surgery  04/07/12   Ladene Artist, MD  Gastroenterology  07/27/12     Full medical history updated and reviewed  in the office today  Patient Active Problem List   Diagnosis Date Noted  . Obesity (BMI 30-39.9) 06/22/2013    Priority: High  . Dyslipidemia 09/27/2008    Priority: High  . Essential hypertension 06/28/2008    Priority: High  . Chronic Fatigue 07/27/2014    Priority: Medium  . Chronic constipation- sees GI 07/27/2014    Priority: Medium  . Hx of cardiovascular stress test 12/22/2015    Priority: Low  . Vitamin D deficiency 12/22/2015    Priority: Low  . Generalized  OA 10/29/2014    Priority: Low  . Chronic pain of right knee 12/21/2016  . Abnormality of heart beat-  08/17/2016  . Acute reaction to situational stress 08/17/2016  . GAD (generalized anxiety disorder) 08/17/2016  . Hematuria 03/13/2016  . Pelvic pressure in female 03/13/2016  . Osteoarthritis of knee, unspecified 10/29/2014  . Rectus diastasis 08/09/2013  . Obesity 06/22/2013  . Gallstone 07/27/2012  . Acute diverticulitis 07/27/2012  . Partial tear of subscapularis tendon 04/22/2012  . Supraspinatus tendon tear 04/22/2012  . Trochanteric bursitis of right hip   . Asymptomatic postmenopausal status 01/04/2009  . Hyperlipidemia 09/27/2008    Past Medical History:  Diagnosis Date  . Arthritis   . Colitis    hx colitis-gi bleed-2006  . DYSLIPIDEMIA   . Hx of cardiovascular stress test    Lexiscan Myoview 9/14:  Small, fixed apical anterior perfusion defect (worse at rest) - probable soft tissue attenuation, EF 61%, low risk study  . HYPERTENSION   . Partial tear of subscapularis tendon 04/22/2012  . POSTMENOPAUSAL STATUS   . Supraspinatus tendon tear 04/22/2012    Past Surgical History:  Procedure Laterality Date  . ABDOMINAL HYSTERECTOMY  1987   Partial  . APPENDECTOMY  2003  . CERVICAL FUSION  2002  . COLONOSCOPY  06/03/04   isch colitis (hosp for same)  . HERNIA REPAIR  1975   umb  . SHOULDER ARTHROSCOPY WITH ROTATOR CUFF REPAIR AND SUBACROMIAL DECOMPRESSION Right 04/22/2012   Procedure: SHOULDER ARTHROSCOPY WITH ROTATOR CUFF REPAIR AND SUBACROMIAL DECOMPRESSION;  Surgeon: Johnny Bridge, MD;  Location: [place];  Service: Orthopedics;  Laterality: Right;  RIGHT SHOULDER ARTHROSCOPY DEBRIDEMENT LIMITED, DECOMPRESSION SUBACROMIAL PARTIAL ACROMIOPLASTY WITH CORACOACROMIAL RELEASE, WITH ROTATOR CUFF REPAIR AND SUBSCAPULARIS REPAIR  . TUBAL LIGATION      Social History   Tobacco Use  . Smoking status: Never Smoker  . Smokeless tobacco: Never Used  .  Tobacco comment: exposed to second hand (spouse smokes), Married  Substance Use Topics  . Alcohol use: No    Family Hx: Family History  Problem Relation Age of Onset  . Hypertension Father   . Stroke Father   . Aneurysm Mother        brain  . Stroke Sister   . Hypertension Sister   . Diabetes Brother      Medications: Current Outpatient Medications  Medication Sig Dispense Refill  . aspirin EC 81 MG tablet Take 1 tablet (81 mg total) by mouth daily. 100 tablet 3  . ezetimibe (ZETIA) 10 MG tablet Take 1 tablet (10 mg total) by mouth daily. 90 tablet 1  . hydrochlorothiazide (HYDRODIURIL) 12.5 MG tablet Take 1 tablet (12.5 mg total) by mouth daily. 90 tablet 1  . olmesartan (BENICAR) 40 MG tablet Take 1 tablet (40 mg total) by mouth daily. 90 tablet 1  . Vitamin D, Ergocalciferol, (DRISDOL) 50000 units CAPS capsule Take 1 capsule (50,000 Units total) by  mouth every 7 (seven) days. 12 capsule 10   No current facility-administered medications for this visit.     Allergies:  Allergies  Allergen Reactions  . Codeine Nausea Only  . Statins   . Benicar [Olmesartan] Other (See Comments)    ?able hair thinning     Review of Systems: General:   No F/C, wt loss Pulm:   No DIB, SOB, pleuritic chest pain Card:  No CP, palpitations Abd:  No n/v/d or pain Ext:  No inc edema from baseline  Objective:  Blood pressure (!) 150/90, pulse 67, height 5\' 6"  (1.676 m), weight 196 lb (88.9 kg). Body mass index is 31.64 kg/m. Gen:   Well NAD, A and O *3 HEENT:    [place]/AT, EOMI,  MMM Lungs:   Normal work of breathing. CTA B/L, no Wh, rhonchi Heart:   RRR, S1, S2 WNL's, no MRG Abd:   No gross distention Exts:    warm, pink,  Brisk capillary refill, warm and well perfused.  Psych:    No HI/SI, judgement and insight good, Euthymic mood. Full Affect.   Recent Results (from the past 2160 hour(s))  Comprehensive metabolic panel     Status: Abnormal   Collection Time: 12/17/16  8:56 AM   Result Value Ref Range   Glucose 114 (H) 65 - 99 mg/dL   BUN 16 8 - 27 mg/dL   Creatinine, Ser 0.74 0.57 - 1.00 mg/dL   GFR calc non Af Amer 85 >59 mL/min/1.73   GFR calc Af Amer 98 >59 mL/min/1.73   BUN/Creatinine Ratio 22 12 - 28   Sodium 140 134 - 144 mmol/L   Potassium 4.2 3.5 - 5.2 mmol/L   Chloride 102 96 - 106 mmol/L   CO2 25 20 - 29 mmol/L   Calcium 10.2 8.7 - 10.3 mg/dL   Total Protein 7.1 6.0 - 8.5 g/dL   Albumin 4.3 3.6 - 4.8 g/dL   Globulin, Total 2.8 1.5 - 4.5 g/dL   Albumin/Globulin Ratio 1.5 1.2 - 2.2   Bilirubin Total 0.6 0.0 - 1.2 mg/dL   Alkaline Phosphatase 69 39 - 117 IU/L   AST 16 0 - 40 IU/L   ALT 16 0 - 32 IU/L  Hemoglobin A1c     Status: None   Collection Time: 12/17/16  8:56 AM  Result Value Ref Range   Hgb A1c MFr Bld 5.4 4.8 - 5.6 %    Comment:          Prediabetes: 5.7 - 6.4          Diabetes: >6.4          Glycemic control for adults with diabetes: <7.0    Est. average glucose Bld gHb Est-mCnc 108 mg/dL  Lipid panel     Status: Abnormal   Collection Time: 12/17/16  8:56 AM  Result Value Ref Range   Cholesterol, Total 190 100 - 199 mg/dL   Triglycerides 102 0 - 149 mg/dL   HDL 55 >39 mg/dL   VLDL Cholesterol Cal 20 5 - 40 mg/dL   LDL Calculated 115 (H) 0 - 99 mg/dL   Chol/HDL Ratio 3.5 0.0 - 4.4 ratio    Comment:                                   T. Chol/HDL Ratio  Men  Women                               1/2 Avg.Risk  3.4    3.3                                   Avg.Risk  5.0    4.4                                2X Avg.Risk  9.6    7.1                                3X Avg.Risk 23.4   11.0   VITAMIN D 25 Hydroxy (Vit-D Deficiency, Fractures)     Status: Abnormal   Collection Time: 12/17/16  8:56 AM  Result Value Ref Range   Vit D, 25-Hydroxy 29.1 (L) 30.0 - 100.0 ng/mL    Comment: Vitamin D deficiency has been defined by the [place] practice guideline as  a level of serum 25-OH vitamin D less than 20 ng/mL (1,2). The Endocrine Society went on to further define vitamin D insufficiency as a level between 21 and 29 ng/mL (2). 1. IOM (Institute of Medicine). 2010. Dietary reference    intakes for calcium and D. [place]: The    [employer]. 2. Holick MF, Binkley [place], Bischoff-Ferrari HA, et al.    Evaluation, treatment, and prevention of vitamin D    deficiency: an Endocrine Society clinical practice    guideline. JCEM. 2011 Jul; 96(7):1911-30.   TSH     Status: None   Collection Time: 12/17/16  8:56 AM  Result Value Ref Range   TSH 1.740 0.450 - 4.500 uIU/mL  Vitamin B12     Status: None   Collection Time: 12/17/16  8:56 AM  Result Value Ref Range   Vitamin B-12 425 232 - 1,245 pg/mL

## 2016-12-21 NOTE — Patient Instructions (Addendum)
In 3 months when you return to clinic after you been taking your vitamin D supplement, we will recheck vitamin D level, CBC, TSH and T4.  - Please let me know if you change her mind and would like a formal physical therapy referral.  Also, if you would consider injection if your pain gets worse please let me know and we could send you to sports medicine and they can do it under ultrasound guidance.  Otherwise please do the exercises I gave you in the handout from sports medicine advisor on patellofemoral syndrome and exercises to strengthen that knee.  Please make follow-up appointment to come see me soon we can take a look further at the knee and evaluate you with possible x-rays etc. if it does not continue to slowly improve.   Ice 15-20 minutes at a time, 3-4 times per day if it is bothering you that day.

## 2017-01-04 ENCOUNTER — Ambulatory Visit: Payer: PPO | Admitting: Family Medicine

## 2017-02-17 DIAGNOSIS — L661 Lichen planopilaris: Secondary | ICD-10-CM | POA: Diagnosis not present

## 2017-02-17 DIAGNOSIS — L309 Dermatitis, unspecified: Secondary | ICD-10-CM | POA: Diagnosis not present

## 2017-02-17 DIAGNOSIS — L570 Actinic keratosis: Secondary | ICD-10-CM | POA: Diagnosis not present

## 2017-03-16 ENCOUNTER — Encounter: Payer: Self-pay | Admitting: Family Medicine

## 2017-03-16 ENCOUNTER — Ambulatory Visit (INDEPENDENT_AMBULATORY_CARE_PROVIDER_SITE_OTHER): Payer: PPO | Admitting: Family Medicine

## 2017-03-16 VITALS — BP 160/87 | HR 58 | Ht 66.0 in | Wt 198.8 lb

## 2017-03-16 DIAGNOSIS — E559 Vitamin D deficiency, unspecified: Secondary | ICD-10-CM | POA: Diagnosis not present

## 2017-03-16 DIAGNOSIS — M25561 Pain in right knee: Secondary | ICD-10-CM | POA: Diagnosis not present

## 2017-03-16 DIAGNOSIS — R5383 Other fatigue: Secondary | ICD-10-CM | POA: Diagnosis not present

## 2017-03-16 DIAGNOSIS — I10 Essential (primary) hypertension: Secondary | ICD-10-CM | POA: Diagnosis not present

## 2017-03-16 DIAGNOSIS — E785 Hyperlipidemia, unspecified: Secondary | ICD-10-CM

## 2017-03-16 DIAGNOSIS — G8929 Other chronic pain: Secondary | ICD-10-CM | POA: Diagnosis not present

## 2017-03-16 DIAGNOSIS — E669 Obesity, unspecified: Secondary | ICD-10-CM

## 2017-03-16 NOTE — Patient Instructions (Addendum)
Goal is to use her recumbent bike for 10 minutes 3 days a week.  You can actually increase this by 5 minutes every 3-4 weeks as tolerated.  You may also increased resistance just a little bit every couple of weeks.  Eventually the goal after several months would be to be doing 30 minutes 5 days/week  -We will get in touch with you via my chart regarding your labs from today which include the vitamin D, CBC, TSH and T4 levels.   For your upcoming cruise- prevention is the key.  You can get a mask that is called in and 95 mask.  You can use some Imodium AD for antidiarrheal action.  I would bring Benadryl in case you have allergic reaction.  Also bring bonine which is a motion sickness medicine as well.  And most importantly, have fun!!!     Guidelines for a Low Cholesterol, Low Saturated Fat Diet   Fats - Limit total intake of fats and oils. - Avoid butter, stick margarine, shortening, lard, palm and coconut oils. - Limit mayonnaise, salad dressings, gravies and sauces, unless they are homemade with low-fat ingredients. - Limit chocolate. - Choose low-fat and nonfat products, such as low-fat mayonnaise, low-fat or non-hydrogenated peanut butter, low-fat or fat-free salad dressings and nonfat gravy. - Use vegetable oil, such as canola or olive oil. - Look for margarine that does not contain trans fatty acids. - Use nuts in moderate amounts. - Read ingredient labels carefully to determine both amount and type of fat present in foods. Limit saturated and trans fats! - Avoid high-fat processed and convenience foods.  Meats and Meat Alternatives - Choose fish, chicken, [place] and lean meats. - Use dried beans, peas, lentils and tofu. - Limit egg yolks to three to four per week. - If you eat red meat, limit to no more than three servings per week and choose loin or round cuts. - Avoid fatty meats, such as bacon, sausage, franks, luncheon meats and ribs. - Avoid all organ meats, including  liver.  Dairy - Choose nonfat or low-fat milk, yogurt and cottage cheese. - Most cheeses are high in fat. Choose cheeses made from non-fat milk, such as mozzarella and ricotta cheese. - Choose light or fat-free cream cheese and sour cream. - Avoid cream and sauces made with cream.  Fruits and Vegetables - Eat a wide variety of fruits and vegetables. - Use lemon juice, vinegar or "mist" olive oil on vegetables. - Avoid adding sauces, fat or oil to vegetables.  Breads, Cereals and Grains - Choose whole-grain breads, cereals, pastas and rice. - Avoid high-fat snack foods, such as granola, cookies, pies, pastries, doughnuts and croissants.  Cooking Tips - Avoid deep fried foods. - Trim visible fat off meats and remove skin from poultry before cooking. - Bake, broil, boil, poach or roast poultry, fish and lean meats. - Drain and discard fat that drains out of meat as you cook it. - Add little or no fat to foods. - Use vegetable oil sprays to grease pans for cooking or baking. - Steam vegetables. - Use herbs or no-oil marinades to flavor foods.

## 2017-03-16 NOTE — Progress Notes (Signed)
Impression and Recommendations:    1. Essential hypertension   2. Obesity (BMI 30-39.9)   3. Dyslipidemia   4. Other fatigue   5. Vitamin D deficiency   6. Chronic pain of right knee     1. Essential HTN: Pt is compliant with her medications and is tolerating them well. Pt is stable and instructed to continue medications as prescribed. She checks her BP every once every two weeks and reports an average of 145/80 at home. Pt educated about different BP monitors. Information and handouts provided.  2. Obesity BMI 30-39.9. Pt vocalized trying to go to the [facility] which we encouraged her to continue to do so. Pt also reports receiving a recumbent bike for Christmas. Pt recommended to use the bike at first 3x/week for 10 minutes a day. Wait 3-4 weeks before increasing time intervals and intensity as tolerated. Eventually, the goal is to increase her exercise regimen to daily for 30 minutes/day, 5-6 days/week. Dietary and exercise guidelines discussed with patient. Recommended pt to reduce intake of saturated, trans fats and fatty carbohydrates. Handouts provided if desired.  -Last OV, pt had R knee pain. She was given exercises from sports medicine guidelines. She reports doing it 3x/week and her symptoms have improved from last visit. Pt recommended to continue these exercises on both knees.  3. Dyslipidemia- LDL 115 from two months ago, lowered from 131 at previous test 1 year ago. HDL is 55 from 2 months ago, increased from previous 46 at previous test 1 year ago. Dietary and exercise guidelines discussed with patient. Recommended pt to reduce intake of saturated, trans fats and fatty carbohydrates. Handouts provided if desired. Continue riding your bike and recommended doing light walking.  -pt is intolerant to Statins.  Gave patient handout on prudent diet and ways to improve her LDL.  It is came down drastically over the past 2-3 years and now is in the 110s versus 170s it was in the  past  4. Other fatigue: Pt instructed to continue taking her Vitamin D supplements as she is tolerating them well and has improved mood and more energy. If vitamin D does not come back in 50-60 range, we will modify her prescription to take the supplements 50,000 IUs twice a week per patient's request. She reports being compliant with drinking 1/2 her body weight of water in oz.   5. Recheck CBC, TSH, T4, Vit D labs today.   6. Follow up in 4 months, will call her regarding results.  Pt agrees to communicate via [place].   Gross side effects, risk and benefits, and alternatives of medications and treatment plan in general discussed with patient.  Patient is aware that all medications have potential side effects and we are unable to predict every side effect or drug-drug interaction that may occur.   Patient will call with any questions prior to using medication if they have concerns.  Expresses verbal understanding and consents to current therapy and treatment regimen.  No barriers to understanding were identified.  Red flag symptoms and signs discussed in detail.  Patient expressed understanding regarding what to do in case of emergency\urgent symptoms  Please see AVS handed out to patient at the end of our visit for further patient instructions/ counseling done pertaining to today's office visit.   Return for 52mo f/up .    Note: This note was prepared with assistance of Dragon voice recognition software. Occasional wrong-word or sound-a-like substitutions may have occurred due to the  inherent limitations of voice recognition software.    This document serves as a record of services personally performed by Mellody Dance, DO. It was created on her behalf by Mayer Masker, a trained medical scribe. The creation of this record is based on the scribe's personal observations and the provider's statements to them.   I have reviewed the above medical documentation for accuracy and completeness  and I concur.  Mellody Dance 03/16/17 11:13 AM  --------------------------------------------------------------------------------------------------------------------------------------------------------------------------------------------------------------------------------------------    Subjective:     HPI: Bailey [name] is a 67 y.o. female who presents to [place] [facility] today for issues as discussed below.    Vitamin D- Pt states she is feeling great after her supplements. She states it took her about 3 weeks, but now she has more energy and improved mood. Pt states she is taking her once 50,000 IUs/week but not the daily OTC 5000.    HTN: Pt checks her BP once every two weeks and reports an average of 145/80.    Fatigue- After taking the vitamin D supplements, she reports increased energy and decreased fatigue as well as improved mood.  Last OV, pt had R knee pain. We gave her exercises from sports medicine guidelines and she reports feeling much improved. She has been doing it 3x/week. She was using ice at first but stopped after her pain improved.   Pt received a bike for christmas and is enthusiastic to do this type of exercising.   Wt Readings from Last 3 Encounters:  03/16/17 198 lb 12.8 oz (90.2 kg)  12/21/16 196 lb (88.9 kg)  08/17/16 198 lb 12.8 oz (90.2 kg)   BP Readings from Last 3 Encounters:  03/16/17 (!) 143/74  12/21/16 (!) 150/90  08/17/16 127/86   Pulse Readings from Last 3 Encounters:  03/16/17 (!) 58  12/21/16 67  08/17/16 65   BMI Readings from Last 3 Encounters:  03/16/17 32.09 kg/m  12/21/16 31.64 kg/m  08/17/16 32.09 kg/m     Patient Care Team    Relationship Specialty Notifications Start End  Mellody Dance, DO PCP - General Family Medicine  12/05/15   Marchia Bond, MD  Orthopedic Surgery  04/07/12   Ladene Artist, MD  Gastroenterology  07/27/12      Patient Active Problem List   Diagnosis Date  Noted  . Obesity (BMI 30-39.9) 06/22/2013    Priority: High  . Dyslipidemia 09/27/2008    Priority: High  . Essential hypertension 06/28/2008    Priority: High  . Chronic Fatigue 07/27/2014    Priority: Medium  . Chronic constipation- sees GI 07/27/2014    Priority: Medium  . Hx of cardiovascular stress test 12/22/2015    Priority: Low  . Vitamin D deficiency 12/22/2015    Priority: Low  . 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 Chronic pain of right knee 12/21/2016  . Abnormality of heart beat-  08/17/2016  . Acute reaction to situational stress 08/17/2016  . GAD (generalized anxiety disorder) 08/17/2016  . Hematuria 03/13/2016  . Pelvic pressure in female 03/13/2016  . Osteoarthritis of knee, unspecified 10/29/2014  . Rectus diastasis 08/09/2013  . Obesity 06/22/2013  . Gallstone 07/27/2012  . Acute diverticulitis 07/27/2012  . Partial tear of subscapularis tendon 04/22/2012  . Supraspinatus tendon tear 04/22/2012  . Trochanteric bursitis of right hip   . Asymptomatic postmenopausal status 01/04/2009  . Hyperlipidemia 09/27/2008    Past Medical history, Surgical history, Family history, Social history,  Allergies and Medications have been entered into the medical record, reviewed and changed as needed.    Current Meds  Medication Sig  . aspirin EC 81 MG tablet Take 1 tablet (81 mg total) by mouth daily.  Marland Kitchen ezetimibe (ZETIA) 10 MG tablet Take 1 tablet (10 mg total) by mouth daily.  . hydrochlorothiazide (HYDRODIURIL) 12.5 MG tablet Take 1 tablet (12.5 mg total) by mouth daily.  Marland Kitchen olmesartan (BENICAR) 40 MG tablet Take 1 tablet (40 mg total) by mouth daily.  . Vitamin D, Ergocalciferol, (DRISDOL) 50000 units CAPS capsule Take 1 capsule (50,000 Units total) by mouth every 7 (seven) days.    Allergies:  Allergies  Allergen Reactions  . Codeine Nausea Only  . Statins   . Benicar [Olmesartan] Other (See Comments)    ?able hair thinning     Review of Systems:   A fourteen system review of systems was performed and found to be positive as per HPI.   Objective:   Blood pressure (!) 143/74, pulse (!) 58, height 5\' 6"  (1.676 m), weight 198 lb 12.8 oz (90.2 kg), SpO2 99 %. Body mass index is 32.09 kg/m. General:  Well Developed, well nourished, appropriate for stated age.  Neuro:  Alert and oriented,  extra-ocular muscles intact  HEENT:  Normocephalic, atraumatic, neck supple, no carotid bruits appreciated  Skin:  no gross rash, warm, pink. Cardiac:  RRR, S1 S2 Respiratory:  ECTA B/L and A/P, Not using accessory muscles, speaking in full sentences- unlabored. Vascular:  Ext warm, no cyanosis apprec.; 1+ peripheral edema, bilateral extremities Psych:  No HI/SI, judgement and insight good, Euthymic mood. Full Affect.

## 2017-03-17 LAB — CBC WITH DIFFERENTIAL/PLATELET
BASOS ABS: 0 10*3/uL (ref 0.0–0.2)
BASOS: 0 %
EOS (ABSOLUTE): 0.1 10*3/uL (ref 0.0–0.4)
Eos: 1 %
Hematocrit: 43.9 % (ref 34.0–46.6)
Hemoglobin: 15.2 g/dL (ref 11.1–15.9)
Immature Grans (Abs): 0 10*3/uL (ref 0.0–0.1)
Immature Granulocytes: 0 %
LYMPHS ABS: 1.8 10*3/uL (ref 0.7–3.1)
LYMPHS: 25 %
MCH: 32 pg (ref 26.6–33.0)
MCHC: 34.6 g/dL (ref 31.5–35.7)
MCV: 92 fL (ref 79–97)
MONOS ABS: 0.5 10*3/uL (ref 0.1–0.9)
Monocytes: 7 %
NEUTROS ABS: 4.7 10*3/uL (ref 1.4–7.0)
Neutrophils: 67 %
PLATELETS: 236 10*3/uL (ref 150–379)
RBC: 4.75 x10E6/uL (ref 3.77–5.28)
RDW: 13.7 % (ref 12.3–15.4)
WBC: 7.1 10*3/uL (ref 3.4–10.8)

## 2017-03-17 LAB — TSH: TSH: 1.59 u[IU]/mL (ref 0.450–4.500)

## 2017-03-17 LAB — VITAMIN D 25 HYDROXY (VIT D DEFICIENCY, FRACTURES): VIT D 25 HYDROXY: 37.8 ng/mL (ref 30.0–100.0)

## 2017-03-17 LAB — T4, FREE: FREE T4: 1.14 ng/dL (ref 0.82–1.77)

## 2017-03-24 ENCOUNTER — Ambulatory Visit (INDEPENDENT_AMBULATORY_CARE_PROVIDER_SITE_OTHER): Payer: PPO | Admitting: Family Medicine

## 2017-03-24 ENCOUNTER — Encounter: Payer: Self-pay | Admitting: Family Medicine

## 2017-03-24 VITALS — BP 130/86 | HR 62 | Temp 98.1°F | Ht 66.0 in | Wt 206.0 lb

## 2017-03-24 DIAGNOSIS — J069 Acute upper respiratory infection, unspecified: Secondary | ICD-10-CM

## 2017-03-24 NOTE — Progress Notes (Signed)
Acute Care Office visit  Assessment and plan:  1. Viral URI    1. Viral URI- Instructed pt to use sinus rinses, neti pot 2x a day, followed by 1 spray of flonase in each nostril. Pt instructed to take OTC medications like dayquil/nyquil or other sinus medications. Take delsum for cough. Use a humidifier at bedside.  Push fluids and get plenty of rest. Recommended pt sleep up at night in a reclined position. Pt instructed to go to the physician on the cruise if her symptoms worsen or she develops new symptoms like 1-sided face pain, ear pain, and fevers.   - Viral vs Allergic vs Bacterial causes for pt's symptoms reveiwed.    - Supportive care and various OTC medications discussed in addition to any prescribed.   - Will consider ABX if sx continue past 10 days and worsening.    Gross side effects, risk and benefits, and alternatives of medications discussed with patient.  Patient is aware that all medications have potential side effects and we are unable to predict every sideeffect or drug-drug interaction that may occur.  Expresses verbal understanding and consents to current therapy plan and treatment regiment.   Education and routine counseling performed. Handouts provided.  Anticipatory guidance and routine counseling done re: condition, txmnt options and need for follow up. All questions of patient's were answered.  Return if symptoms worsen or fail to improve, for The patient will keep chronic follow-up visits as planned.  Please see AVS handed out to patient at the end of our visit for additional patient instructions/ counseling done pertaining to today's office visit.  Note: This document was partially repared using Dragon voice recognition software and may include unintentional dictation errors.  This document serves as a record of services personally performed by Mellody Dance, DO. It was created on her behalf by Mayer Masker, a trained medical scribe. The creation of this  record is based on the scribe's personal observations and the provider's statements to them.   I have reviewed the above medical documentation for accuracy and completeness and I concur.  Mellody Dance 03/24/17 6:57 PM    Subjective:    Chief Complaint  Patient presents with  . Sinus Problem    sinus congestion and pressure, ear pressure, cough at times mostly at night x 3-4 days    HPI:  Pt presents with Sx for 3 days   C/o: congestion in throat and left ear.  She wakes up during the night with phlegm and has post-nasal drip. She also has a dry cough-only at night with the postnasal drip-that causes pain/discomfort in her throat. She states she just feels "bad".   Denies: rhinorrhea, F/C, one sided face pain, sob, cp, DIB/ wh    For symptoms patient has tried:  Zyrtec and tylenol with mild improvement.  Overall getting:   Not W , nor better-just wanted guidance somewhat you to make it go away  Pt is going on a cruise in the near future and wanted to get checked out before her trip.  She would like to get a "magic pill" from the doctor before her trip.   She is driving, not flying, to her trip.    Patient Care Team    Relationship Specialty Notifications Start End  Mellody Dance, DO PCP - General Family Medicine  12/05/15   Marchia Bond, MD  Orthopedic Surgery  04/07/12   Ladene Artist, MD  Gastroenterology  07/27/12     Past medical  history, Surgical history, Family history reviewed and noted below, Social history, Allergies, and Medications have been entered into the medical record, reviewed and changed as needed.   Allergies  Allergen Reactions  . Codeine Nausea Only  . Statins   . Benicar [Olmesartan] Other (See Comments)    ?able hair thinning    Review of Systems: - see above HPI for pertinent positives General:   No F/C, wt loss Pulm:   No DIB, pleuritic chest pain Card:  No CP, palpitations Abd:  No n/v/d or pain Ext:  No inc edema from  baseline   Objective:   Blood pressure 130/86, pulse 62, temperature 98.1 F (36.7 C), height 5\' 6"  (1.676 m), weight 206 lb (93.4 kg), SpO2 97 %. Body mass index is 33.25 kg/m. General: Well Developed, well nourished, appropriate for stated age.  Neuro: Alert and oriented x3, extra-ocular muscles intact, sensation grossly intact.  HEENT: Normocephalic, atraumatic, pupils equal round reactive to light, neck supple, no masses, no painful lymphadenopathy, TM's intact B/L, no acute findings. Nares- patent, clear d/c, OP- clear, mild erythema, No TTP sinuses Skin: Warm and dry, no gross rash. Cardiac: RRR, S1 S2,  no murmurs rubs or gallops.  Respiratory: ECTA B/L and A/P, Not using accessory muscles, speaking in full sentences- unlabored. Vascular:  No gross lower ext edema, cap RF less 2 sec. Psych: No HI/SI, judgement and insight good, Euthymic mood. Full Affect.

## 2017-03-24 NOTE — Patient Instructions (Signed)
Told patient to use Nettie pot or Neomed sinus rinses or a Y are sinus rinses twice daily followed by 1 spray Flonase each nostril.  She can continue to take Zyrtec but if the congestion and cold symptoms get worse I recommend something cold and sinus daytime as well as nighttime relief tablets.  If the symptoms last more than 7-10 days and she gets worse, she should consider going to see a doctor and consider getting an antibiotic therapy at that time.  Otherwise rest fluids and really take care of herself with eating well and sleeping well is important to help deal with a cold such as this one.   ------------------------------------------------------------------------------------------------------------------------------------------------------------------------------------ You most likely have a viral infection that should resolve on its own over time (or this could be a flare of seasonal allergies as well).   Symptoms for a viral upper respiratory tract infection usually last 3-7 days but can stretch out to 2-3 weeks before you're feeling back to normal.  Your symptoms should not worsen after 7-10 days and if they truely do, please notify our office, as you may need antibiotics.  You can use over-the-counter afrin nasal spray for up to 3 days (NO longer than that) which will help acutely with nasal drainage/ congestion short term.   Also, sterile saline nasal rinses, such as Milta Deiters med or AYR sinus rinses, can be very helpful and should be done twice daily- especially throughout the allergy season.   Remember you should use distilled water or previously boiled water to do this.   You can also use an over the counter cold and flu medication such as Tylenol Severe Cold and Sinus/Flu or Dayquil, Nyquil and the like, which will help with cough, congestion, headache/ pain, fevers/chills etc.  Please note, if you being treated for hypertension or have high blood pressure, you should be using the cold meds  designated "HBP".    Unfortunately, antibiotics are not helpful for viral infections.  Wash your hands frequently, as you did not want to get those around you sick as well. Never sneeze or cough on others.  And you should not be going to school or work if you are running a temperature of 100.5 or more on two separate occasions.   Drink plenty of fluids and stay hydrated, especially if you are running fevers.  We don't know why, but chicken soup also helps, try it! :)

## 2017-03-25 ENCOUNTER — Telehealth: Payer: Self-pay | Admitting: Family Medicine

## 2017-03-25 MED ORDER — FLUTICASONE PROPIONATE 50 MCG/ACT NA SUSP: 2.0000 | Freq: Every day | NASAL | 0 refills | Status: DC

## 2017-03-25 NOTE — Addendum Note (Signed)
Addended by: Lanier Prude D on: 03/25/2017 11:58 AM   Modules accepted: Orders

## 2017-03-25 NOTE — Telephone Encounter (Signed)
Resent, patient notified. MPulliam, CMA/RT(R)

## 2017-03-25 NOTE — Telephone Encounter (Signed)
Patient was told she would get a prescription for Flonase and have it sent to [facility], she went by there and they do not have this order. Can we get that sent in please.

## 2017-04-22 DIAGNOSIS — L661 Lichen planopilaris, unspecified: Secondary | ICD-10-CM | POA: Insufficient documentation

## 2017-04-22 DIAGNOSIS — L658 Other specified nonscarring hair loss: Secondary | ICD-10-CM | POA: Diagnosis not present

## 2017-04-26 DIAGNOSIS — L570 Actinic keratosis: Secondary | ICD-10-CM | POA: Diagnosis not present

## 2017-06-13 ENCOUNTER — Other Ambulatory Visit: Payer: Self-pay | Admitting: Family Medicine

## 2017-06-13 DIAGNOSIS — I10 Essential (primary) hypertension: Secondary | ICD-10-CM

## 2017-06-13 DIAGNOSIS — R5383 Other fatigue: Secondary | ICD-10-CM

## 2017-06-23 ENCOUNTER — Ambulatory Visit (INDEPENDENT_AMBULATORY_CARE_PROVIDER_SITE_OTHER): Payer: PPO | Admitting: Adult Health

## 2017-06-23 ENCOUNTER — Encounter: Payer: Self-pay | Admitting: Adult Health

## 2017-06-23 VITALS — BP 151/80 | HR 60 | Temp 98.3°F | Ht 66.0 in | Wt 199.7 lb

## 2017-06-23 DIAGNOSIS — R8290 Unspecified abnormal findings in urine: Secondary | ICD-10-CM

## 2017-06-23 DIAGNOSIS — R300 Dysuria: Secondary | ICD-10-CM | POA: Diagnosis not present

## 2017-06-23 LAB — POCT URINALYSIS DIPSTICK
Bilirubin, UA: NEGATIVE
GLUCOSE UA: NEGATIVE
Ketones, UA: NEGATIVE
Nitrite, UA: POSITIVE
PH UA: 6.5 (ref 5.0–8.0)
Protein, UA: 30
Spec Grav, UA: 1.02 (ref 1.010–1.025)
Urobilinogen, UA: 0.2 E.U./dL

## 2017-06-23 MED ORDER — NITROFURANTOIN MONOHYD MACRO 100 MG PO CAPS: 100.0000 mg | ORAL_CAPSULE | Freq: Two times a day (BID) | ORAL | 0 refills | Status: DC

## 2017-06-23 NOTE — Progress Notes (Signed)
Subjective:    Patient ID: Bailey [name], female    DOB: 1951/03/02, 67 y.o.   MRN: [id number]  HPI:  Bailey [name] presents with dysuria, urinary frequency, and "low bladder pressure" that started last Thursday.  She denies abdominal or flank pain. She denies hematuria or hematochezia. She denies fever/night sweats/N/V/D She has been pushing water and took a few doses of OTC AZO. She also took a "left over Macrobid from last year" at about 0400 this morning.  She reports not completing full course of Macrobid last year. Discussed the importance of taking entire course of ABX.  Patient Care Team    Relationship Specialty Notifications Start End  Mellody Dance, DO PCP - General Family Medicine  12/05/15   Marchia Bond, MD  Orthopedic Surgery  04/07/12   Ladene Artist, MD  Gastroenterology  07/27/12     Patient Active Problem List   Diagnosis Date Noted  . Dysuria 06/23/2017  . Abnormal urinalysis 06/23/2017  . Chronic pain of right knee 12/21/2016  . Abnormality of heart beat-  08/17/2016  . Acute reaction to situational stress 08/17/2016  . GAD (generalized anxiety disorder) 08/17/2016  . Hematuria 03/13/2016  . Pelvic pressure in female 03/13/2016  . Hx of cardiovascular stress test 12/22/2015  . Vitamin D deficiency 12/22/2015  . Generalized OA 10/29/2014  . Osteoarthritis of knee, unspecified 10/29/2014  . Chronic Fatigue 07/27/2014  . Chronic constipation- sees GI 07/27/2014  . Rectus diastasis 08/09/2013  . Obesity (BMI 30-39.9) 06/22/2013  . Obesity 06/22/2013  . Gallstone 07/27/2012  . Acute diverticulitis 07/27/2012  . Partial tear of subscapularis tendon 04/22/2012  . Supraspinatus tendon tear 04/22/2012  . Trochanteric bursitis of right hip   . Asymptomatic postmenopausal status 01/04/2009  . Dyslipidemia 09/27/2008  . Hyperlipidemia 09/27/2008  . Essential hypertension 06/28/2008     Past Medical History:  Diagnosis Date  . Arthritis   . Colitis     hx colitis-gi bleed-2006  . DYSLIPIDEMIA   . Hx of cardiovascular stress test    Lexiscan Myoview 9/14:  Small, fixed apical anterior perfusion defect (worse at rest) - probable soft tissue attenuation, EF 61%, low risk study  . HYPERTENSION   . Partial tear of subscapularis tendon 04/22/2012  . POSTMENOPAUSAL STATUS   . Supraspinatus tendon tear 04/22/2012     Past Surgical History:  Procedure Laterality Date  . ABDOMINAL HYSTERECTOMY  1987   Partial  . APPENDECTOMY  2003  . CERVICAL FUSION  2002  . COLONOSCOPY  06/03/04   isch colitis (hosp for same)  . HERNIA REPAIR  1975   umb  . SHOULDER ARTHROSCOPY WITH ROTATOR CUFF REPAIR AND SUBACROMIAL DECOMPRESSION Right 04/22/2012   Procedure: SHOULDER ARTHROSCOPY WITH ROTATOR CUFF REPAIR AND SUBACROMIAL DECOMPRESSION;  Surgeon: Johnny Bridge, MD;  Location: [facility];  Service: Orthopedics;  Laterality: Right;  RIGHT SHOULDER ARTHROSCOPY DEBRIDEMENT LIMITED, DECOMPRESSION SUBACROMIAL PARTIAL ACROMIOPLASTY WITH CORACOACROMIAL RELEASE, WITH ROTATOR CUFF REPAIR AND SUBSCAPULARIS REPAIR  . TUBAL LIGATION       Family History  Problem Relation Age of Onset  . Hypertension Father   . Stroke Father   . Aneurysm Mother        brain  . Stroke Sister   . Hypertension Sister   . Diabetes Brother      Social History   Substance and Sexual Activity  Drug Use No     Social History   Substance and Sexual Activity  Alcohol Use No  Social History   Tobacco Use  Smoking Status Never Smoker  Smokeless Tobacco Never Used  Tobacco Comment   exposed to second hand (spouse smokes), Married     Outpatient Encounter Medications as of 06/23/2017  Medication Sig  . aspirin EC 81 MG tablet Take 1 tablet (81 mg total) by mouth daily.  Marland Kitchen ezetimibe (ZETIA) 10 MG tablet Take 1 tablet (10 mg total) by mouth daily.  . hydrochlorothiazide (HYDRODIURIL) 12.5 MG tablet TAKE 1 TABLET BY MOUTH EVERY DAY  . olmesartan (BENICAR)  40 MG tablet TAKE 1 TABLET BY MOUTH EVERY DAY  . Vitamin D, Ergocalciferol, (DRISDOL) 50000 units CAPS capsule Take 1 capsule (50,000 Units total) by mouth every 7 (seven) days.  . fluticasone (FLONASE) 50 MCG/ACT nasal spray Place 2 sprays into both nostrils daily.  . nitrofurantoin, macrocrystal-monohydrate, (MACROBID) 100 MG capsule Take 1 capsule (100 mg total) by mouth 2 (two) times daily.  . [DISCONTINUED] nitrofurantoin, macrocrystal-monohydrate, (MACROBID) 100 MG capsule Take 1 capsule (100 mg total) by mouth 2 (two) times daily.   No facility-administered encounter medications on file as of 06/23/2017.     Allergies: Codeine; Statins; and Benicar [olmesartan]  Body mass index is 32.23 kg/m.  Blood pressure (!) 151/80, pulse 60, temperature 98.3 F (36.8 C), temperature source Oral, height 5\' 6"  (1.676 m), weight 199 lb 11.2 oz (90.6 kg), SpO2 97 %.  Review of Systems  Constitutional: Positive for fatigue. Negative for activity change, appetite change, chills, diaphoresis, fever and unexpected weight change.  Respiratory: Negative for cough, chest tightness, shortness of breath, wheezing and stridor.   Cardiovascular: Negative for chest pain, palpitations and leg swelling.  Gastrointestinal: Negative for abdominal distention, abdominal pain, blood in stool, constipation, nausea and vomiting.  Genitourinary: Positive for dysuria, frequency and urgency. Negative for difficulty urinating, flank pain, hematuria and pelvic pain.  Neurological: Negative for dizziness and headaches.  Hematological: Does not bruise/bleed easily.       Objective:   Physical Exam  Constitutional: She is oriented to person, place, and time. She appears well-developed and well-nourished. No distress.  HENT:  Head: Normocephalic and atraumatic.  Right Ear: External ear normal.  Left Ear: External ear normal.  Eyes: Pupils are equal, round, and reactive to light. Conjunctivae are normal.   Cardiovascular: Normal rate, regular rhythm, normal heart sounds and intact distal pulses.  No murmur heard. Pulmonary/Chest: Effort normal and breath sounds normal. No respiratory distress. She has no wheezes. She has no rales. She exhibits no tenderness.  Abdominal: Soft. Bowel sounds are normal. She exhibits no distension and no mass. There is no tenderness. There is no rebound, no guarding and no CVA tenderness.  Neurological: She is alert and oriented to person, place, and time.  Skin: Skin is warm and dry. No rash noted. She is not diaphoretic. No erythema. No pallor.  Psychiatric: She has a normal mood and affect. Her behavior is normal. Judgment and thought content normal.  Nursing note and vitals reviewed.         Assessment & Plan:   1. Dysuria   2. Abnormal urinalysis     Abnormal urinalysis UA- Blo: Mod NIT: + LEU: Large Urine C/S sent off Please take Macrobid 100mg  twice daily, take for 7 days- please take entire course of antibiotics. Increase fluids. We will call you when urine culture results are available.    FOLLOW-UP:  Return if symptoms worsen or fail to improve.

## 2017-06-23 NOTE — Patient Instructions (Signed)
Urinary Tract Infection, Adult A urinary tract infection (UTI) is an infection of any part of the urinary tract. The urinary tract includes the:  Kidneys.  Ureters.  Bladder.  Urethra.  These organs make, store, and get rid of pee (urine) in the body. Follow these instructions at home:  Take over-the-counter and prescription medicines only as told by your doctor.  If you were prescribed an antibiotic medicine, take it as told by your doctor. Do not stop taking the antibiotic even if you start to feel better.  Avoid the following drinks: ? Alcohol. ? Caffeine. ? Tea. ? Carbonated drinks.  Drink enough fluid to keep your pee clear or pale yellow.  Keep all follow-up visits as told by your doctor. This is important.  Make sure to: ? Empty your bladder often and completely. Do not to hold pee for long periods of time. ? Empty your bladder before and after sex. ? Wipe from front to back after a bowel movement if you are female. Use each tissue one time when you wipe. Contact a doctor if:  You have back pain.  You have a fever.  You feel sick to your stomach (nauseous).  You throw up (vomit).  Your symptoms do not get better after 3 days.  Your symptoms go away and then come back. Get help right away if:  You have very bad back pain.  You have very bad lower belly (abdominal) pain.  You are throwing up and cannot keep down any medicines or water. This information is not intended to replace advice given to you by your health care provider. Make sure you discuss any questions you have with your health care provider. Document Released: 08/05/2007 Document Revised: 07/25/2015 Document Reviewed: 01/07/2015 Elsevier Interactive Patient Education  2018 [employer].  Please take Macrobid 100mg  twice daily, take for 7 days- please take entire course of antibiotics. Increase fluids. We will call you when urine culture results are available. FEEL BETTER!

## 2017-06-23 NOTE — Assessment & Plan Note (Signed)
UA- Blo: Mod NIT: + LEU: Large Urine C/S sent off Please take Macrobid 100mg  twice daily, take for 7 days- please take entire course of antibiotics. Increase fluids. We will call you when urine culture results are available.

## 2017-06-29 ENCOUNTER — Telehealth: Payer: Self-pay

## 2017-06-29 ENCOUNTER — Other Ambulatory Visit: Payer: Self-pay | Admitting: Adult Health

## 2017-06-29 LAB — CULTURE, URINE COMPREHENSIVE

## 2017-06-29 MED ORDER — CIPROFLOXACIN HCL 500 MG PO TABS: 500.0000 mg | ORAL_TABLET | Freq: Two times a day (BID) | ORAL | 3 refills | Status: DC

## 2017-06-29 NOTE — Telephone Encounter (Signed)
Urine C/S  Received: Today  Message Contents  Danford, Berna Spare, NP  Fonnie Mu, CMA        Good Evening Kenney Houseman,  Can you please call Ms. Tetro and share-  Urine C/S shows that organism is susceptible to Cipro.  Please stop Macrobid and take Cipri 500mg  BID x 3 days.  Thanks!  Katy    Pt informed of results.  Pt expressed understanding and is agreeable.  Charyl Bigger, CMA

## 2017-07-05 DIAGNOSIS — L570 Actinic keratosis: Secondary | ICD-10-CM | POA: Diagnosis not present

## 2017-07-14 ENCOUNTER — Encounter: Payer: Self-pay | Admitting: Family Medicine

## 2017-07-14 ENCOUNTER — Ambulatory Visit (INDEPENDENT_AMBULATORY_CARE_PROVIDER_SITE_OTHER): Payer: PPO | Admitting: Family Medicine

## 2017-07-14 VITALS — BP 138/80 | HR 63 | Ht 66.0 in | Wt 198.6 lb

## 2017-07-14 DIAGNOSIS — I10 Essential (primary) hypertension: Secondary | ICD-10-CM | POA: Diagnosis not present

## 2017-07-14 DIAGNOSIS — E785 Hyperlipidemia, unspecified: Secondary | ICD-10-CM | POA: Diagnosis not present

## 2017-07-14 DIAGNOSIS — Z789 Other specified health status: Secondary | ICD-10-CM | POA: Diagnosis not present

## 2017-07-14 DIAGNOSIS — E559 Vitamin D deficiency, unspecified: Secondary | ICD-10-CM

## 2017-07-14 DIAGNOSIS — R5383 Other fatigue: Secondary | ICD-10-CM | POA: Diagnosis not present

## 2017-07-14 DIAGNOSIS — Z723 Lack of physical exercise: Secondary | ICD-10-CM | POA: Diagnosis not present

## 2017-07-14 DIAGNOSIS — G5793 Unspecified mononeuropathy of bilateral lower limbs: Secondary | ICD-10-CM | POA: Diagnosis not present

## 2017-07-14 DIAGNOSIS — E669 Obesity, unspecified: Secondary | ICD-10-CM

## 2017-07-14 NOTE — Progress Notes (Signed)
Impression and Recommendations:    1. Obesity (BMI 30-39.9)   2. Essential hypertension   3. Dyslipidemia   4. Intolerance of drug-  will not take statin   5. Vitamin D deficiency   6. 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 Other fatigue   8. Inactivity     1. Obesity -Pt reports losing 12 lbs since last chronic OV but has gained some back since a recent bladder infection.  -encouraged to continue losing weight.  -AHA dietary and exercise guidelines discussed.  -Goal: get back at the [facility] and weight watchers.   2. Essential HTN -pt is asymptomatic and stable at this time. BP well-controlled. -continue meds. -Check your BP at home and keep a log. Bring this into next OV.   3. Dyslipidemia/intolerance to statins -pt is intolerant to statins.  -Continue zetia.  -prudent diet and exercise discussed.   4. Vit D deficiency -continue supplements   5. Mild sx of neuropathy -recommend weight loss. Will continue to monitor.  -encouraged regular exercise. Pt declines referral at this time.   6. Fatigue/inactivity -prudent diet and exercised discussed. Get back into exercising.   -drink adequate amounts of water, equal to half of your wt in oz per day.   No orders of the defined types were placed in this encounter.   No orders of the defined types were placed in this encounter.   Gross side effects, risk and benefits, and alternatives of medications and treatment plan in general discussed with patient.  Patient is aware that all medications have potential side effects and we are unable to predict every side effect or drug-drug interaction that may occur.   Patient will call with any questions prior to using medication if they have concerns.  Expresses verbal understanding and consents to current therapy and treatment regimen.  No barriers to understanding were identified.  Red flag symptoms and signs discussed in detail.  Patient expressed understanding  regarding what to do in case of emergency\urgent symptoms  Please see AVS handed out to patient at the end of our visit for further patient instructions/ counseling done pertaining to today's office visit.   Return for 4-36mo.    Note: This note was prepared with assistance of Dragon voice recognition software. Occasional wrong-word or sound-a-like substitutions may have occurred due to the inherent limitations of voice recognition software.  This document serves as a record of services personally performed by Mellody Dance, DO. It was created on her behalf by Mayer Masker, a trained medical scribe. The creation of this record is based on the scribe's personal observations and the provider's statements to them.   I have reviewed the above medical documentation for accuracy and completeness and I concur.  Mellody Dance 07/15/17 8:13 AM  --------------------------------------------------------------------------------------------------------------------------------------------------------------------------------------------------------------------------------------------    Subjective:     HPI: Bailey [name] is a 67 y.o. female who presents to [facility] today for issues as discussed below.  Diet/exercise She has been swimming at the [facility] recently. She has also been going 2 classes a week. She has not been in the last month due to a recent bladder infection.   She has lost 12 lbs after being on weight watchers, but has fallen off the wagon after her recent illness. She feels empowered to lose weight and is going to get back on it.   She reports going on another cruise next week.  Restless feet  She complains of restlessness in her bilateral feet with burning and cold intolerance after being on her feet all day. She states this occurs 4 nights a week. She has a h/o neck and back surgery. She denies numbness/tingling.    HTN HPI:  -  Her blood  pressure has been controlled at home.  Pt is checking it at home but not regularly, about 1x/week.  She states it is 130s (<140) / low 80s (<85) at home.   - Patient reports good compliance with blood pressure medications.  - Denies medication S-E   - Smoking Status noted   - She denies new onset of: chest pain, exercise intolerance, shortness of breath, dizziness, visual changes, headache, lower extremity swelling or claudication.   Last 3 blood pressure readings in our office are as follows: BP Readings from Last 3 Encounters:  07/14/17 138/80  06/23/17 (!) 151/80  03/24/17 130/86    Filed Weights   07/14/17 1044  Weight: 198 lb 9.6 oz (90.1 kg)     Wt Readings from Last 3 Encounters:  07/14/17 198 lb 9.6 oz (90.1 kg)  06/23/17 199 lb 11.2 oz (90.6 kg)  03/24/17 206 lb (93.4 kg)   BP Readings from Last 3 Encounters:  07/14/17 138/80  06/23/17 (!) 151/80  03/24/17 130/86   Pulse Readings from Last 3 Encounters:  07/14/17 63  06/23/17 60  03/24/17 62   BMI Readings from Last 3 Encounters:  07/14/17 32.05 kg/m  06/23/17 32.23 kg/m  03/24/17 33.25 kg/m     Patient Care Team    Relationship Specialty Notifications Start End  Mellody Dance, DO PCP - General Family Medicine  12/05/15   Marchia Bond, MD  Orthopedic Surgery  04/07/12   Ladene Artist, MD  Gastroenterology  07/27/12      Patient Active Problem List   Diagnosis Date Noted  . Obesity (BMI 30-39.9) 06/22/2013    Priority: High  . Dyslipidemia 09/27/2008    Priority: High  . Essential hypertension 06/28/2008    Priority: High  . Chronic Fatigue 07/27/2014    Priority: Medium  . Chronic constipation- sees GI 07/27/2014    Priority: Medium  . Hx of cardiovascular stress test 12/22/2015    Priority: Low  . Vitamin D deficiency 12/22/2015    Priority: Low  . 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 Intolerance of drug-  will not take statin 07/14/2017  . mild sx of Neuropathy of  both feet- comes and goes 07/14/2017  . Inactivity 07/14/2017  . Dysuria 06/23/2017  . Abnormal urinalysis 06/23/2017  . Chronic pain of right knee 12/21/2016  . Abnormality of heart beat-  08/17/2016  . Acute reaction to situational stress 08/17/2016  . GAD (generalized anxiety disorder) 08/17/2016  . Hematuria 03/13/2016  . Pelvic pressure in female 03/13/2016  . Osteoarthritis of knee, unspecified 10/29/2014  . Rectus diastasis 08/09/2013  . Obesity 06/22/2013  . Gallstone 07/27/2012  . Acute diverticulitis 07/27/2012  . Partial tear of subscapularis tendon 04/22/2012  . Supraspinatus tendon tear 04/22/2012  . Trochanteric bursitis of right hip   . Asymptomatic postmenopausal status 01/04/2009  . Hyperlipidemia 09/27/2008    Past Medical history, Surgical history, Family history, Social history, Allergies and Medications have been entered into the medical record, reviewed and changed as needed.    Current Meds  Medication Sig  . aspirin EC 81 MG tablet Take 1 tablet (81 mg total) by mouth daily.  Marland Kitchen ezetimibe (ZETIA) 10 MG  tablet Take 1 tablet (10 mg total) by mouth daily.  . fluticasone (FLONASE) 50 MCG/ACT nasal spray Place 2 sprays into both nostrils daily.  . hydrochlorothiazide (HYDRODIURIL) 12.5 MG tablet TAKE 1 TABLET BY MOUTH EVERY DAY  . olmesartan (BENICAR) 40 MG tablet TAKE 1 TABLET BY MOUTH EVERY DAY  . Vitamin D, Ergocalciferol, (DRISDOL) 50000 units CAPS capsule Take 1 capsule (50,000 Units total) by mouth every 7 (seven) days.    Allergies:  Allergies  Allergen Reactions  . Codeine Nausea Only  . Statins   . Benicar [Olmesartan] Other (See Comments)    ?able hair thinning     Review of Systems:  A fourteen system review of systems was performed and found to be positive as per HPI.   Objective:   Blood pressure 138/80, pulse 63, height 5\' 6"  (1.676 m), weight 198 lb 9.6 oz (90.1 kg), SpO2 99 %. Body mass index is 32.05 kg/m. General:  Well  Developed, well nourished, appropriate for stated age.  Neuro:  Alert and oriented,  extra-ocular muscles intact  HEENT:  Normocephalic, atraumatic, neck supple, no carotid bruits appreciated  Skin:  no gross rash, warm, pink. Cardiac:  RRR, S1 S2 Respiratory:  ECTA B/L and A/P, Not using accessory muscles, speaking in full sentences- unlabored. Vascular:  Ext warm, no cyanosis apprec.; cap RF less 2 sec. Psych:  No HI/SI, judgement and insight good, Euthymic mood. Full Affect.

## 2017-07-14 NOTE — Patient Instructions (Addendum)
Goal: get back at the [facility] and [employer].     How to Increase Your Level of Physical Activity  Getting regular physical activity is important for your overall health and well-being. Most people do not get enough exercise. There are easy ways to increase your level of physical activity, even if you have not been very active in the past or you are just starting out. Why is physical activity important? Physical activity has many short-term and long-term health benefits. Regular exercise can:  Help you lose weight or maintain a healthy weight.  Strengthen your muscles and bones.  Boost your mood and improve self-esteem.  Reduce your risk of certain long-term (chronic) diseases, like heart disease, cancer, and diabetes.  Help you stay capable of walking and moving around (mobile) as you age.  Prevent accidents, such as falls, as you age.  Increase life expectancy.  What are the benefits of being physically active on a regular basis? In addition to improving your physical health, being physically active on most days of the week can help you in ways that you may not expect. Benefits of regular physical activity may include:  Feeling good about your body.  Being able to move around more easily and for longer periods of time without getting tired (increased stamina).  Finding new sources of fun and enjoyment.  Meeting new people who share a common interest.  Being able to fight off illness better (enhanced immunity).  Being able to sleep better.  What can happen if I am not physically active on a regular basis? Not getting enough physical activity can lead to an unhealthy lifestyle and future health problems. This can increase your chances of:  Becoming overweight or obese.  Becoming sick.  Developing chronic illnesses, like heart disease or diabetes.  Having mental health problems, like depression or anxiety.  Having sleep problems.  Having trouble walking or getting  yourself around (reduced mobility).  Injuring yourself in a fall as you get older.  What steps can I take to be more physically active?  Check with your health care provider about how to get started. Ask your health care provider what activities are safe for you.  Start out slowly. Walking or doing some simple chair exercises is a good place to start, especially if you have not been active before or for a long time.  Try to find activities that you enjoy. You are more likely to commit to an exercise routine if it does not feel like a chore.  If you have bone or joint problems, choose low-impact exercises, like walking or swimming.  Include physical activity in your everyday routine.  Invite friends or family members to exercise with you. This also will help you commit to your workout plan.  Set goals that you can work toward.  Aim for at least 150 minutes of moderate-intensity exercise each week. Examples of moderate-intensity exercise include walking or riding a bike. Where to find more information:  Centers for Disease Control and Prevention: [employer].is  [employer] on [place] www.http://villegas.org/  [employer]: [employer] Contact a health care provider if:  You have headaches, muscle aches, or joint pain.  You feel dizzy or light-headed while exercising.  You faint.  You have chest pain while exercising. Summary  Exercise benefits your mind and body at any age, even if you are just starting out.  If you have a chronic illness or have not been active for a while, check with your health care  provider before increasing your physical activity.  Choose activities that are safe and enjoyable for you.Ask your health care provider what activities are safe for you.  Start slowly. Tell your health care provider if you have problems as you start to increase your activity  level. This information is not intended to replace advice given to you by your health care provider. Make sure you discuss any questions you have with your health care provider. Document Released: 02/06/2016 Document Revised: 02/06/2016 Document Reviewed: 02/06/2016 Elsevier Interactive Patient Education  2018 [place] [employer] for [employer]  Weight management involves adopting a healthy lifestyle that includes a knowledge of nutrition and exercise, a positive attitude and the right kind of motivation. Internal motives such as better health, increased energy, self-esteem and personal control increase your chances of lifelong weight management success.  Remember to have realistic goals and think long-term success. Believe in yourself and you can do it. The following information will give you ideas to help you meet your goals.  Control Your Home Environment  Eat only while sitting down at the kitchen or dining room table. Do not eat while watching television, reading, cooking, talking on the phone, standing at the refrigerator or working on the computer. Keep tempting foods out of the house -- don't buy them. Keep tempting foods out of sight. Have low-calorie foods ready to eat. Unless you are preparing a meal, stay out of the kitchen. Have healthy snacks at your disposal, such as small pieces of fruit, vegetables, canned fruit, pretzels, low-fat string cheese and nonfat cottage cheese.  Control Your Work Environment  Do not eat at [employer] or keep tempting snacks at your desk. If you get hungry between meals, plan healthy snacks and bring them with you to work. During your breaks, go for a walk instead of eating. If you work around food, plan in advance the one item you will eat at mealtime. Make it inconvenient to nibble on food by chewing gum, sugarless candy or drinking water or another low-calorie beverage. Do not work through meals. Skipping  meals slows down metabolism and may result in overeating at the next meal. If food is available for special occasions, either pick the healthiest item, nibble on low-fat snacks brought from home, don't have anything offered, choose one option and have a small amount, or have only a beverage.  Control Your Mealtime Environment  Serve your plate of food at the stove or kitchen counter. Do not put the serving dishes on the table. If you do put dishes on the table, remove them immediately when finished eating. Fill half of your plate with vegetables, a quarter with lean protein and a quarter with starch. Use smaller plates, bowls and glasses. A smaller portion will look large when it is in a little dish. Politely refuse second helpings. When fixing your plate, limit portions of food to one scoop/serving or less.   Daily Food Management  Replace eating with another activity that you will not associate with food. Wait 20 minutes before eating something you are craving. Drink a large glass of water or diet soda before eating. Always have a big glass or bottle of water to drink throughout the day. Avoid high-calorie add-ons such as cream with your coffee, butter, mayonnaise and salad dressings.  Shopping: Do not shop when hungry or tired. Shop from a list and avoid buying anything that is not on your list. If you must have tempting foods, buy  individual-sized packages and try to find a lower-calorie alternative. Don't taste test in the store. Read food labels. Compare products to help you make the healthiest choices.  Preparation: Chew a piece of gum while cooking meals. Use a quarter teaspoon if you taste test your food. Try to only fix what you are going to eat, leaving yourself no chance for seconds. If you have prepared more food than you need, portion it into individual containers and freeze or refrigerate immediately. Don't snack while cooking meals.  Eating: Eat slowly. Remember it  takes about 20 minutes for your stomach to send a message to your brain that it is full. Don't let fake hunger make you think you need more. The ideal way to eat is to take a bite, put your utensil down, take a sip of water, cut your next bite, take a bit, put your utensil down and so on. Do not cut your food all at one time. Cut only as needed. Take small bites and chew your food well. Stop eating for a minute or two at least once during a meal or snack. Take breaks to reflect and have conversation.  Cleanup and Leftovers: Label leftovers for a specific meal or snack. Freeze or refrigerate individual portions of leftovers. Do not clean up if you are still hungry.  Eating Out and Social Eating  Do not arrive hungry. Eat something light before the meal. Try to fill up on low-calorie foods, such as vegetables and fruit, and eat smaller portions of the high-calorie foods. Eat foods that you like, but choose small portions. If you want seconds, wait at least 20 minutes after you have eaten to see if you are actually hungry or if your eyes are bigger than your stomach. Limit alcoholic beverages. Try a soda water with a twist of lime. Do not skip other meals in the day to save room for the special event.  At Restaurants: Order  la carte rather than buffet style. Order some vegetables or a salad for an appetizer instead of eating bread. If you order a high-calorie dish, share it with someone. Try an after-dinner mint with your coffee. If you do have dessert, share it with two or more people. Don't overeat because you do not want to waste food. Ask for a doggie bag to take extra food home. Tell the server to put half of your entree in a to go bag before the meal is served to you. Ask for salad dressing, gravy or high-fat sauces on the side. Dip the tip of your fork in the dressing before each bite. If bread is served, ask for only one piece. Try it plain without butter or oil. At [employer] where oil and vinegar is served with bread, use only a small amount of oil and a lot of vinegar for dipping.  At a Friend[facility]: Offer to bring a dish, appetizer or dessert that is low in calories. Serve yourself small portions or tell the host that you only want a small amount. Stand or sit away from the snack table. Stay away from the kitchen or stay busy if you are near the food. Limit your alcohol intake.  At [employer] and Cafeterias: Cover most of your plate with lettuce and/or vegetables. Use a salad plate instead of a dinner plate. After eating, clear away your dishes before having coffee or tea.  Entertaining at Home: Explore low-fat, low-cholesterol cookbooks. Use single-serving foods like chicken breasts or hamburger patties. Prepare low-calorie appetizers  and desserts.   Holidays: Keep tempting foods out of sight. Decorate the house without using food. Have low-calorie beverages and foods on hand for guests. Allow yourself one planned treat a day. Don't skip meals to save up for the holiday feast. Eat regular, planned meals.   Exercise Well  Make exercise a priority and a planned activity in the day. If possible, walk the entire or part of the distance to work. Get an exercise buddy. Go for a walk with a colleague during one of your breaks, go to the gym, run or take a walk with a friend, walk in the mall with a shopping companion. Park at the end of the parking lot and walk to the store or office entrance. Always take the stairs all of the way or at least part of the way to your floor. If you have a desk job, walk around the office frequently. Do leg lifts while sitting at your desk. Do something outside on the weekends like going for a hike or a bike ride.   Have a Healthy Attitude  Make health your weight management priority. Be realistic. Have a goal to achieve a healthier you, not necessarily the lowest weight or ideal weight based on calculations  or tables. Focus on a healthy eating style, not on dieting. Dieting usually lasts for a short amount of time and rarely produces long-term success. Think long term. You are developing new healthy behaviors to follow next month, in a year and in a decade.    This information is for educational purposes only and is not intended to replace the advice of your doctor or health care provider. We encourage you to discuss with your doctor any questions or concerns you may have.        Guidelines for Losing Weight   We want weight loss that will last so you should lose 1-2 pounds a week.  THAT IS IT! Please pick THREE things a month to change. Once it is a habit check off the item. Then pick another three items off the list to become habits.  If you are already doing a habit on the list GREAT!  Cross that item off!  Dont drink your calories. Ie, alcohol, soda, fruit juice, and sweet tea.   Drink more water. Drink a glass when you feel hungry or before each meal.   Eat breakfast - Complex carb and protein (likeDannon light and fit yogurt, oatmeal, fruit, eggs, [place] bacon).  Measure your cereal.  Eat no more than one cup a day. (ie Kashi)  Eat an apple a day.  Add a vegetable a day.  Try a new vegetable a month.  Use Pam! Stop using oil or butter to cook.  Dont finish your plate or use smaller plates.  Share your dessert.  Eat sugar free Jello for dessert or frozen grapes.  Dont eat 2-3 hours before bed.  Switch to whole wheat bread, pasta, and brown rice.  Make healthier choices when you eat out. No fries!  Pick baked chicken, NOT fried.  Dont forget to SLOW DOWN when you eat. It is not going anywhere.   Take the stairs.  Park far away in the parking lot  Lift soup cans (or weights) for 10 minutes while watching TV.  Walk at work for 10 minutes during break.  Walk outside 1 time a week with your friend, kids, dog, or significant other.  Start a walking group at  church.  Walk the mall as  much as you can tolerate.   Keep a food diary.  Weigh yourself daily.  Walk for 15 minutes 3 days per week.  Cook at home more often and eat out less. If life happens and you go back to old habits, it is okay.  Just start over. You can do it!  If you experience chest pain, get short of breath, or tired during the exercise, please stop immediately and inform your doctor.    Before you even begin to attack a weight-loss plan, it pays to remember this: You are not fat. You have fat. Losing weight isn't about blame or shame; it's simply another achievement to accomplish. Dieting is like any other skill--you have to buckle down and work at it. As long as you act in a smart, reasonable way, you'll ultimately get where you want to be. Here are some weight loss pearls for you.   1. It's Not a Diet. It's a Lifestyle Thinking of a diet as something you're on and suffering through only for the short term doesn't work. To shed weight and keep it off, you need to make permanent changes to the way you eat. It's OK to indulge occasionally, of course, but if you cut calories temporarily and then revert to your old way of eating, you'll gain back the weight quicker than you can say yo-yo. Use it to lose it. Research shows that one of the best predictors of long-term weight loss is how many pounds you drop in the first month. For that reason, nutritionists often suggest being stricter for the first two weeks of your new eating strategy to build momentum. Cut out added sugar and alcohol and avoid unrefined carbs. After that, figure out how you can reincorporate them in a way that's healthy and maintainable.  2. There's a Right Way to Exercise Working out burns calories and fat and boosts your metabolism by building muscle. But those trying to lose weight are notorious for overestimating the number of calories they burn and underestimating the amount they take in. Unfortunately, your  system is biologically programmed to hold on to extra pounds and that means when you start exercising, your body senses the deficit and ramps up its hunger signals. If you're not diligent, you'll eat everything you burn and then some. Use it, to lose it. Cardio gets all the exercise glory, but strength and interval training are the real heroes. They help you build lean muscle, which in turn increases your metabolism and calorie-burning ability 3. Don't Overreact to Mild Hunger Some people have a hard time losing weight because of hunger anxiety. To them, being hungry is bad--something to be avoided at all costs--so they carry snacks with them and eat when they don't need to. Others eat because they're stressed out or bored. While you never want to get to the point of being ravenous (that's when bingeing is likely to happen), a hunger pang, a craving, or the fact that it's 3:00 p.m. should not send you racing for the vending machine or obsessing about the energy bar in your purse. Ideally, you should put off eating until your stomach is growling and it's difficult to concentrate.  Use it to lose it. When you feel the urge to eat, use the HALT method. Ask yourself, Am I really hungry? Or am I angry or anxious, lonely or bored, or tired? If you're still not certain, try the apple test. If you're truly hungry, an apple should seem delicious; if it doesn't, something  else is going on. Or you can try drinking water and making yourself busy, if you are still hungry try a healthy snack.  4. Not All Calories Are Created Equal The mechanics of weight loss are pretty simple: Take in fewer calories than you use for energy. But the kind of food you eat makes all the difference. Processed food that's high in saturated fat and refined starch or sugar can cause inflammation that disrupts the hormone signals that tell your brain you're full. The result: You eat a lot more.  Use it to lose it. Clean up your diet. Swap in  whole, unprocessed foods, including vegetables, lean protein, and healthy fats that will fill you up and give you the biggest nutritional bang for your calorie buck. In a few weeks, as your brain starts receiving regular hunger and fullness signals once again, you'll notice that you feel less hungry overall and naturally start cutting back on the amount you eat.  5. Protein, Produce, and Plant-Based Fats Are Your Weight-Loss Trinity Here's why eating the three Ps regularly will help you drop pounds. Protein fills you up. You need it to build lean muscle, which keeps your metabolism humming so that you can torch more fat. People in a weight-loss program who ate double the recommended daily allowance for protein (about 110 grams for a 150-pound woman) lost 70 percent of their weight from fat, while people who ate the RDA lost only about 40 percent, one study found. Produce is packed with filling fiber. "It's very difficult to consume too many calories if you're eating a lot of vegetables. Example: Three cups of broccoli is a lot of food, yet only 93 calories. (Fruit is another story. It can be easy to overeat and can contain a lot of calories from sugar, so be sure to monitor your intake.) Plant-based fats like olive oil and those in avocados and nuts are healthy and extra satiating.  Use it to lose it. Aim to incorporate each of the three Ps into every meal and snack. People who eat protein throughout the day are able to keep weight off, according to a study in the [employer] of Clinical Nutrition. In addition to meat, poultry and seafood, good sources are beans, lentils, eggs, tofu, and yogurt. As for fat, keep portion sizes in check by measuring out salad dressing, oil, and nut butters (shoot for one to two tablespoons). Finally, eat veggies or a little fruit at every meal. People who did that consumed 308 fewer calories but didn't feel any hungrier than when they didn't eat more produce.  7. How You  Eat Is As Important As What You Eat In order for your brain to register that you're full, you need to focus on what you're eating. Sit down whenever you eat, preferably at a table. Turn off the TV or computer, put down your phone, and look at your food. Smell it. Chew slowly, and don't put another bite on your fork until you swallow. When women ate lunch this attentively, they consumed 30 percent less when snacking later than those who listened to an audiobook at lunchtime, according to a study in the [place] of Nutrition. 8. Weighing Yourself Really Works The scale provides the best evidence about whether your efforts are paying off. Seeing the numbers tick up or down or stagnate is motivation to keep going--or to rethink your approach. A 2015 study at [facility] found that daily weigh-ins helped people lose more weight, keep it off,  and maintain that loss, even after two years. Use it to lose it. Step on the scale at the same time every day for the best results. If your weight shoots up several pounds from one weigh-in to the next, don't freak out. Eating a lot of salt the night before or having your period is the likely culprit. The number should return to normal in a day or two. It's a steady climb that you need to do something about. 9. Too Much Stress and Too Little Sleep Are Your Enemies When you're tired and frazzled, your body cranks up the production of cortisol, the stress hormone that can cause carb cravings. Not getting enough sleep also boosts your levels of ghrelin, a hormone associated with hunger, while suppressing leptin, a hormone that signals fullness and satiety. People on a diet who slept only five and a half hours a night for two weeks lost 55 percent less fat and were hungrier than those who slept eight and a half hours, according to a study in the [place]. Use it to lose it. Prioritize sleep, aiming for seven hours or more a night, which  research shows helps lower stress. And make sure you're getting quality zzz's. If a snoring spouse or a fidgety cat wakes you up frequently throughout the night, you may end up getting the equivalent of just four hours of sleep, according to a study from [facility]. Keep pets out of the bedroom, and use a white-noise app to drown out snoring. 10. You Will Hit a plateau--And You Can Bust Through It As you slim down, your body releases much less leptin, the fullness hormone.  If you're not strength training, start right now. Building muscle can raise your metabolism to help you overcome a plateau. To keep your body challenged and burning calories, incorporate new moves and more intense intervals into your workouts or add another sweat session to your weekly routine. Alternatively, cut an extra 100 calories or so a day from your diet. Now that you've lost weight, your body simply doesn't need as much fuel.    Since food equals calories, in order to lose weight you must either eat fewer calories, exercise more to burn off calories with activity, or both. Food that is not used to fuel the body is stored as fat. A major component of losing weight is to make smarter food choices. Here's how:  1)   Limit non-nutritious foods, such as: Sugar, honey, syrups and candy Pastries, donuts, pies, cakes and cookies Soft drinks, sweetened juices and alcoholic beverages  2)  Cut down on high-fat foods by: - Choosing poultry, fish or lean red meat - Choosing low-fat cooking methods, such as baking, broiling, steaming, grilling and boiling - Using low-fat or non-fat dairy products - Using vinaigrette, herbs, lemon or fat-free salad dressings - Avoiding fatty meats, such as bacon, sausage, franks, ribs and luncheon meats - Avoiding high-fat snacks like nuts, chips and chocolate - Avoiding fried foods - Using less butter, margarine, oil and mayonnaise - Avoiding high-fat gravies, cream sauces and  cream-based soups  3) Eat a variety of foods, including: - Fruit and vegetables that are raw, steamed or baked - Whole grains, breads, cereal, rice and pasta - Dairy products, such as low-fat or non-fat milk or yogurt, low-fat cottage cheese and low-fat cheese - Protein-rich foods like chicken, [place], fish, lean meat and legumes, or beans  4) Change your eating habits by: - Eat three balanced meals  a day to help control your hunger - Watch portion sizes and eat small servings of a variety of foods - Choose low-calorie snacks - Eat only when you are hungry and stop when you are satisfied - Eat slowly and try not to perform other tasks while eating - Find other activities to distract you from food, such as walking, taking up a hobby or being involved in the community - Include regular exercise in your daily routine ( minimum of 20 min of moderate-intensity exercise at least 5 days/week)  - Find a support group, if necessary, for emotional support in your weight loss journey           Easy ways to cut 100 calories   1. Eat your eggs with hot sauce OR salsa instead of cheese.  Eggs are great for breakfast, but many people consider eggs and cheese to be BFFs. Instead of cheese--1 oz. of cheddar has 114 calories--top your eggs with hot sauce, which contains no calories and helps with satiety and metabolism. Salsa is also a great option!!  2. Top your toast, waffles or pancakes with fresh berries instead of jelly or syrup. Half a cup of berries--fresh, frozen or thawed--has about 40 calories, compared with 2 tbsp. of maple syrup or jelly, which both have about 100 calories. The berries will also give you a good punch of fiber, which helps keep you full and satisfied and wont spike blood sugar quickly like the jelly or syrup. 3. Swap the non-fat latte for black coffee with a splash of half-and-half. Contrary to its name, that non-fat latte has 130 calories and a startling 19g of  carbohydrates per 16 oz. serving. Replacing that light drinkable dessert with a black coffee with a splash of half-and-half saves you more than 100 calories per 16 oz. serving. 4. Sprinkle salads with freeze-dried raspberries instead of dried cranberries. If you want a sweet addition to your nutritious salad, stay away from dried cranberries. They have a whopping 130 calories per  cup and 30g carbohydrates. Instead, sprinkle freeze-dried raspberries guilt-free and save more than 100 calories per  cup serving, adding 3g of belly-filling fiber. 5. Go for mustard in place of mayo on your sandwich. Mustard can add really nice flavor to any sandwich, and there are tons of varieties, from spicy to honey. A serving of mayo is 95 calories, versus 10 calories in a serving of mustard.  Or try an avocado mayo spread: You can find the recipe few click this link: https://www.californiaavocado.com/recipes/recipe-container/[place]-avocado-mayo 6. Choose a DIY salad dressing instead of the store-bought kind. Mix Dijon or whole grain mustard with low-fat Kefir or red wine vinegar and garlic. 7. Use hummus as a spread instead of a dip. Use hummus as a spread on a high-fiber cracker or tortilla with a sandwich and save on calories without sacrificing taste. 8. Pick just one salad accessory. Salad isnt automatically a calorie winner. Its easy to over-accessorize with toppings. Instead of topping your salad with nuts, avocado and cranberries (all three will clock in at 313 calories), just pick one. The next day, choose a different accessory, which will also keep your salad interesting. You dont wear all your jewelry every day, right? 9. Ditch the white pasta in favor of spaghetti squash. One cup of cooked spaghetti squash has about 40 calories, compared with traditional spaghetti, which comes with more than 200. Spaghetti squash is also nutrient-dense. Its a good source of fiber and Vitamins A and C, and it  can  be eaten just like you would eat pasta--with a great tomato sauce and [place] meatballs or with pesto, tofu and spinach, for example. 10. Dress up your chili, soups and stews with non-fat [place] yogurt instead of sour cream. Just a dollop of sour cream can set you back 115 calories and a whopping 12g of fat--seven of which are of the artery-clogging variety. Added bonus: [place] yogurt is packed with muscle-building protein, calcium and B Vitamins. 11. Mash cauliflower instead of mashed potatoes. One cup of traditional mashed potatoes--in all their creamy goodness--has more than 200 calories, compared to mashed cauliflower, which you can typically eat for less than 100 calories per 1 cup serving. Cauliflower is a great source of the antioxidant indole-3-carbinol (I3C), which may help reduce the risk of some cancers, like breast cancer. 12. Ditch the ice cream sundae in favor of a [place] yogurt parfait. Instead of a cup of ice cream or fro-yo for dessert, try 1 cup of nonfat Greek yogurt topped with fresh berries and a sprinkle of cacao nibs. Both toppings are packed with antioxidants, which can help reduce cellular inflammation and oxidative damage. And the comparison is a no-brainer: One cup of ice cream has about 275 calories; one cup of frozen yogurt has about 230; and a cup of Greek yogurt has just 130, plus twice the protein, so youre less likely to return to the freezer for a second helping. 13. Put olive oil in a spray container instead of using it directly from the bottle. Each tablespoon of olive oil is 120 calories and 15g of fat. Use a mister instead of pouring it straight into the pan or onto a salad. This allows for portion control and will save you more than 100 calories. 14. When baking, substitute canned pumpkin for butter or oil. Canned pumpkin--not pumpkin pie mix--is loaded with Vitamin A, which is important for skin and eye health, as well as immunity. And the comparisons are pretty  crazy:  cup of canned pumpkin has about 40 calories, compared to butter or oil, which has more than 800 calories. Yes, 800 calories. Applesauce and mashed banana can also serve as good substitutions for butter or oil, usually in a 1:1 ratio. 15. Top casseroles with high-fiber cereal instead of breadcrumbs. Breadcrumbs are typically made with white bread, while breakfast cereals contain 5-9g of fiber per serving. Not only will you save more than 150 calories per  cup serving, the swap will also keep you more full and youll get a metabolism boost from the added fiber. 16. Snack on pistachios instead of macadamia nuts. Believe it or not, you get the same amount of calories from 35 pistachios (100 calories) as you would from only five macadamia nuts. 17. Chow down on kale chips rather than potato chips. This is my favorite dont knock it till you try it swap. Kale chips are so easy to make at home, and you can spice them up with a little grated parmesan or chili powder. Plus, theyre a mere fraction of the calories of potato chips, but with the same crunch factor we crave so often. 18. Add seltzer and some fruit slices to your cocktail instead of soda or fruit juice. One cup of soda or fruit juice can pack on as much as 140 calories. Instead, use seltzer and fruit slices. The fruit provides valuable phytochemicals, such as flavonoids and anthocyanins, which help to combat cancer and stave off the aging process.

## 2017-09-01 ENCOUNTER — Other Ambulatory Visit: Payer: Self-pay | Admitting: Family Medicine

## 2017-09-01 DIAGNOSIS — E785 Hyperlipidemia, unspecified: Secondary | ICD-10-CM

## 2017-09-29 ENCOUNTER — Telehealth: Payer: Self-pay | Admitting: Family Medicine

## 2017-09-29 NOTE — Telephone Encounter (Signed)
Called the patient and left message for patient to call the office back. MPulliam, CMA/RT(R)

## 2017-09-29 NOTE — Telephone Encounter (Signed)
Called and spoke to the patient.  She states that the HCTZ was causing her to have to get up 3 to 4 times to go to the bathroom every night.  She states that she has slowly came off the medication and has been completely off for 1 month.  She denies any symptoms of high BP, dizziness, chest pains, SOB, or swelling.  Patient states that she is monitoring and BP have been staying in the 130s/80s.  Please advise if there is anything we need to tell the patient at this time. Patient states that she will follow up if any symptoms occur. MPulliam, CMA/RT(R)

## 2017-09-29 NOTE — Telephone Encounter (Signed)
Patient wants to speak with clinic staff, she states that she was put on hydrochlorothiazide but has since stopped this med. She says that it made her get up 3-4 times a night and just didn't like the "feeling" of it. She states that her BP is doing ok and she is not having any swelling. She wanted to make the provider aware of this and wants to speak further about this issue. Please advise

## 2017-09-29 NOTE — Telephone Encounter (Signed)
I saw patient way back in May and she has been on that medicine for her blood pressure for many months even prior to that.  I did not newly start her on it, or any new blood pressure medicines last OV.  Thus, I am not sure why patient is calling now stating it is causing her to get up 3-4 times per night to go to the bathroom if she has been on it for 6+ months.   Please ensure patient has no other additional symptoms- new swelling in her legs, orthopnea, paroxysmal nocturnal dyspnea, etc.  Recommend she continue to monitor her blood pressure as well as her pulse.  She should write this down on a piece of paper that she brings in next office visit with me.  Back in May she was told to follow-up so please make sure she is planning to do so.

## 2017-09-30 ENCOUNTER — Telehealth: Payer: Self-pay | Admitting: Family Medicine

## 2017-09-30 DIAGNOSIS — L661 Lichen planopilaris: Secondary | ICD-10-CM | POA: Diagnosis not present

## 2017-09-30 DIAGNOSIS — L658 Other specified nonscarring hair loss: Secondary | ICD-10-CM | POA: Diagnosis not present

## 2017-09-30 DIAGNOSIS — L570 Actinic keratosis: Secondary | ICD-10-CM | POA: Insufficient documentation

## 2017-09-30 NOTE — Telephone Encounter (Signed)
Patient states medical assistant left her a message to call office.  ----Forwarding to [facility].  --glh

## 2017-09-30 NOTE — Telephone Encounter (Signed)
Called the patient, she states that having to get up in the middle of the night to go to the bathroom got to be that many times a few months ago and had gotten worse.  Patient states that stopping the med has helped with this.  Patient denies any swelling in her legs, orthopena, or paroxysmal nocturnal dyspnea.  Patient will monitor BP and heart rate and bring log to next OV that she plans to call and make in the near future. MPulliam, CMA/RT(R)

## 2017-12-20 DIAGNOSIS — L570 Actinic keratosis: Secondary | ICD-10-CM | POA: Diagnosis not present

## 2017-12-26 ENCOUNTER — Other Ambulatory Visit: Payer: Self-pay | Admitting: Family Medicine

## 2017-12-26 DIAGNOSIS — I10 Essential (primary) hypertension: Secondary | ICD-10-CM

## 2018-01-05 ENCOUNTER — Ambulatory Visit: Payer: PPO | Admitting: Family Medicine

## 2018-01-21 ENCOUNTER — Other Ambulatory Visit: Payer: Self-pay | Admitting: Family Medicine

## 2018-01-21 DIAGNOSIS — I10 Essential (primary) hypertension: Secondary | ICD-10-CM

## 2018-01-24 ENCOUNTER — Encounter: Payer: Self-pay | Admitting: Family Medicine

## 2018-01-24 ENCOUNTER — Ambulatory Visit (INDEPENDENT_AMBULATORY_CARE_PROVIDER_SITE_OTHER): Payer: PPO | Admitting: Family Medicine

## 2018-01-24 VITALS — BP 178/84 | HR 65 | Temp 97.7°F | Ht 66.0 in | Wt 203.3 lb

## 2018-01-24 DIAGNOSIS — I10 Essential (primary) hypertension: Secondary | ICD-10-CM | POA: Diagnosis not present

## 2018-01-24 DIAGNOSIS — E559 Vitamin D deficiency, unspecified: Secondary | ICD-10-CM | POA: Diagnosis not present

## 2018-01-24 DIAGNOSIS — Z789 Other specified health status: Secondary | ICD-10-CM

## 2018-01-24 DIAGNOSIS — E785 Hyperlipidemia, unspecified: Secondary | ICD-10-CM | POA: Diagnosis not present

## 2018-01-24 MED ORDER — OLMESARTAN-AMLODIPINE-HCTZ 20-5-12.5 MG PO TABS: 1.0000 | ORAL_TABLET | Freq: Every day | ORAL | 1 refills | Status: DC

## 2018-01-24 MED ORDER — VITAMIN D (ERGOCALCIFEROL) 1.25 MG (50000 UNIT) PO CAPS: 50000.0000 [IU] | ORAL_CAPSULE | ORAL | 10 refills | Status: DC

## 2018-01-24 MED ORDER — EZETIMIBE 10 MG PO TABS: 10.0000 mg | ORAL_TABLET | Freq: Every day | ORAL | 1 refills | Status: DC

## 2018-01-24 NOTE — Patient Instructions (Signed)
Your goal blood pressure should be 130/80 or less on a regular basis, or medications should be started/ modified.    Normal blood pressure is less than 120/80.    Hypertension Hypertension, commonly called high blood pressure, is when the force of blood pumping through the arteries is too strong. The arteries are the blood vessels that carry blood from the heart throughout the body. Hypertension forces the heart to work harder to pump blood and may cause arteries to become narrow or stiff. Having untreated or uncontrolled hypertension can cause heart attacks, strokes, kidney disease, and other problems. A blood pressure reading consists of a higher number over a lower number. Ideally, your blood pressure should be below 120/80. The first ("top") number is called the systolic pressure. It is a measure of the pressure in your arteries as your heart beats. The second ("bottom") number is called the diastolic pressure. It is a measure of the pressure in your arteries as the heart relaxes. What are the causes? The cause of this condition is not known. What increases the risk? Some risk factors for high blood pressure are under your control. Others are not. Factors you can change  Smoking.  Having type 2 diabetes mellitus, high cholesterol, or both.  Not getting enough exercise or physical activity.  Being overweight.  Having too much fat, sugar, calories, or salt (sodium) in your diet.  Drinking too much alcohol. Factors that are difficult or impossible to change  Having chronic kidney disease.  Having a family history of high blood pressure.  Age. Risk increases with age.  Race. You may be at higher risk if you are African-American.  Gender. Men are at higher risk than women before age 45. After age 65, women are at higher risk than men.  Having obstructive sleep apnea.  Stress. What are the signs or symptoms? Extremely high blood pressure (hypertensive crisis) may cause:   Headache.  Anxiety.  Shortness of breath.  Nosebleed.  Nausea and vomiting.  Severe chest pain.  Jerky movements you cannot control (seizures).  How is this diagnosed? This condition is diagnosed by measuring your blood pressure while you are seated, with your arm resting on a surface. The cuff of the blood pressure monitor will be placed directly against the skin of your upper arm at the level of your heart. It should be measured at least twice using the same arm. Certain conditions can cause a difference in blood pressure between your right and left arms. Certain factors can cause blood pressure readings to be lower or higher than normal (elevated) for a short period of time:  When your blood pressure is higher when you are in a health care provider's office than when you are at home, this is called white coat hypertension. Most people with this condition do not need medicines.  When your blood pressure is higher at home than when you are in a health care provider's office, this is called masked hypertension. Most people with this condition may need medicines to control blood pressure.  If you have a high blood pressure reading during one visit or you have normal blood pressure with other risk factors:  You may be asked to return on a different day to have your blood pressure checked again.  You may be asked to monitor your blood pressure at home for 1 week or longer.  If you are diagnosed with hypertension, you may have other blood or imaging tests to help your health care provider understand   your overall risk for other conditions. How is this treated? This condition is treated by making healthy lifestyle changes, such as eating healthy foods, exercising more, and reducing your alcohol intake. Your health care provider may prescribe medicine if lifestyle changes are not enough to get your blood pressure under control, and if:  Your systolic blood pressure is above 130.  Your  diastolic blood pressure is above 80.  Your personal target blood pressure may vary depending on your medical conditions, your age, and other factors. Follow these instructions at home: Eating and drinking  Eat a diet that is high in fiber and potassium, and low in sodium, added sugar, and fat. An example eating plan is called the DASH (Dietary Approaches to Stop Hypertension) diet. To eat this way: ? Eat plenty of fresh fruits and vegetables. Try to fill half of your plate at each meal with fruits and vegetables. ? Eat whole grains, such as whole wheat pasta, brown rice, or whole grain bread. Fill about one quarter of your plate with whole grains. ? Eat or drink low-fat dairy products, such as skim milk or low-fat yogurt. ? Avoid fatty cuts of meat, processed or cured meats, and poultry with skin. Fill about one quarter of your plate with lean proteins, such as fish, chicken without skin, beans, eggs, and tofu. ? Avoid premade and processed foods. These tend to be higher in sodium, added sugar, and fat.  Reduce your daily sodium intake. Most people with hypertension should eat less than 1,500 mg of sodium a day.  Limit alcohol intake to no more than 1 drink a day for nonpregnant women and 2 drinks a day for men. One drink equals 12 oz of beer, 5 oz of wine, or 1 oz of hard liquor. Lifestyle  Work with your health care provider to maintain a healthy body weight or to lose weight. Ask what an ideal weight is for you.  Get at least 30 minutes of exercise that causes your heart to beat faster (aerobic exercise) most days of the week. Activities may include walking, swimming, or biking.  Include exercise to strengthen your muscles (resistance exercise), such as pilates or lifting weights, as part of your weekly exercise routine. Try to do these types of exercises for 30 minutes at least 3 days a week.  Do not use any products that contain nicotine or tobacco, such as cigarettes and e-cigarettes.  If you need help quitting, ask your health care provider.  Monitor your blood pressure at home as told by your health care provider.  Keep all follow-up visits as told by your health care provider. This is important. Medicines  Take over-the-counter and prescription medicines only as told by your health care provider. Follow directions carefully. Blood pressure medicines must be taken as prescribed.  Do not skip doses of blood pressure medicine. Doing this puts you at risk for problems and can make the medicine less effective.  Ask your health care provider about side effects or reactions to medicines that you should watch for. Contact a health care provider if:  You think you are having a reaction to a medicine you are taking.  You have headaches that keep coming back (recurring).  You feel dizzy.  You have swelling in your ankles.  You have trouble with your vision. Get help right away if:  You develop a severe headache or confusion.  You have unusual weakness or numbness.  You feel faint.  You have severe pain in your chest   or abdomen.  You vomit repeatedly.  You have trouble breathing. Summary  Hypertension is when the force of blood pumping through your arteries is too strong. If this condition is not controlled, it may put you at risk for serious complications.  Your personal target blood pressure may vary depending on your medical conditions, your age, and other factors. For most people, a normal blood pressure is less than 120/80.  Hypertension is treated with lifestyle changes, medicines, or a combination of both. Lifestyle changes include weight loss, eating a healthy, low-sodium diet, exercising more, and limiting alcohol. This information is not intended to replace advice given to you by your health care provider. Make sure you discuss any questions you have with your health care provider. Document Released: 02/16/2005 Document Revised: 01/15/2016 Document  Reviewed: 01/15/2016 Elsevier Interactive Patient Education  2018 [employer]    How to Take Your Blood Pressure   Blood pressure is a measurement of how strongly your blood is pressing against the walls of your arteries. Arteries are blood vessels that carry blood from your heart throughout your body. Your health care provider takes your blood pressure at each office visit. You can also take your own blood pressure at home with a blood pressure machine. You may need to take your own blood pressure:  To confirm a diagnosis of high blood pressure (hypertension).  To monitor your blood pressure over time.  To make sure your blood pressure medicine is working.  Supplies needed: To take your blood pressure, you will need a blood pressure machine. You can buy a blood pressure machine, or blood pressure monitor, at most drugstores or online. There are several types of home blood pressure monitors. When choosing one, consider the following:  Choose a monitor that has an arm cuff.  Choose a monitor that wraps snugly around your upper arm. You should be able to fit only one finger between your arm and the cuff.  Do not choose a monitor that measures your blood pressure from your wrist or finger.  Your health care provider can suggest a reliable monitor that will meet your needs. How to prepare To get the most accurate reading, avoid the following for 30 minutes before you check your blood pressure:  Drinking caffeine.  Drinking alcohol.  Eating.  Smoking.  Exercising.  Five minutes before you check your blood pressure:  Empty your bladder.  Sit quietly without talking in a dining chair, rather than in a soft couch or armchair.  How to take your blood pressure To check your blood pressure, follow the instructions in the manual that came with your blood pressure monitor. If you have a digital blood pressure monitor, the instructions may be as follows: 1. Sit up straight. 2.  Place your feet on the floor. Do not cross your ankles or legs. 3. Rest your left arm at the level of your heart on a table or desk or on the arm of a chair. 4. Pull up your shirt sleeve. 5. Wrap the blood pressure cuff around the upper part of your left arm, 1 inch (2.5 cm) above your elbow. It is best to wrap the cuff around bare skin. 6. Fit the cuff snugly around your arm. You should be able to place only one finger between the cuff and your arm. 7. Position the cord inside the groove of your elbow. 8. Press the power button. 9. Sit quietly while the cuff inflates and deflates. 10. Read the digital reading on the monitor   screen and write it down (record it). 11. Wait 2-3 minutes, then repeat the steps, starting at step 1.  What does my blood pressure reading mean? A blood pressure reading consists of a higher number over a lower number. Ideally, your blood pressure should be below 120/80. The first ("top") number is called the systolic pressure. It is a measure of the pressure in your arteries as your heart beats. The second ("bottom") number is called the diastolic pressure. It is a measure of the pressure in your arteries as the heart relaxes. Blood pressure is classified into four stages. The following are the stages for adults who do not have a short-term serious illness or a chronic condition. Systolic pressure and diastolic pressure are measured in a unit called mm Hg. Normal  Systolic pressure: below 120.  Diastolic pressure: below 80. Elevated  Systolic pressure: 120-129.  Diastolic pressure: below 80. Hypertension stage 1  Systolic pressure: 130-139.  Diastolic pressure: 80-89. Hypertension stage 2  Systolic pressure: 140 or above.  Diastolic pressure: 90 or above. You can have prehypertension or hypertension even if only the systolic or only the diastolic number in your reading is higher than normal. Follow these instructions at home:  Check your blood pressure as  often as recommended by your health care provider.  Take your monitor to the next appointment with your health care provider to make sure: ? That you are using it correctly. ? That it provides accurate readings.  Be sure you understand what your goal blood pressure numbers are.  Tell your health care provider if you are having any side effects from blood pressure medicine. Contact a health care provider if:  Your blood pressure is consistently high. Get help right away if:  Your systolic blood pressure is higher than 180.  Your diastolic blood pressure is higher than 110. This information is not intended to replace advice given to you by your health care provider. Make sure you discuss any questions you have with your health care provider. Document Released: 07/26/2015 Document Revised: 10/08/2015 Document Reviewed: 07/26/2015 Elsevier Interactive Patient Education  2018 [employer]   

## 2018-01-24 NOTE — Progress Notes (Signed)
Impression and Recommendations:    1. Essential hypertension   2. Dyslipidemia   3. Intolerance of drug-  will not take statin   4. Vitamin D deficiency     1. Hypertension - BP not optimally controlled at this time. - Patient discontinued HCTZ since last appointment. - Recommended change in treatment plan today.   - Begin combination BP management.  See med list below. - Patient tolerating meds well without complication.  Denies S-E  - Lifestyle changes such as dash diet and engaging in a regular exercise program discussed with patient.  Educational handouts provided.  - Ambulatory BP monitoring encouraged.  Check your BP on Monday morning, Wednesday afternoon, Friday evening.  Keep log and bring in next OV.  - Educated patient extensively today regarding the causes and onset of high blood pressure. - Counseled patient about the recommended range of blood pressure at heart rate at length today.  2. Hyperlipidemia - Patient with intolerance to statins and several medications.  Per patient, experiences extreme cramps while on statins.   - Educated patient that she may be referred to cardiology for other medications, assistance with tolerance, dosing, etc.  3. BMI Counseling - BMI of 32.8 Explained to patient what BMI refers to, and what it means medically.    Told patient to think about it as a "medical risk stratification measurement" and how increasing BMI is associated with increasing risk/ or worsening state of various diseases such as hypertension, hyperlipidemia, diabetes, premature OA, depression etc.  American Heart Association guidelines for healthy diet, basically Mediterranean diet, and exercise guidelines of 30 minutes 5 days per week or more discussed in detail.  Health counseling performed.  All questions answered.  4. Lifestyle & Preventative Health Maintenance - Advised patient to continue working toward exercising to improve overall mental, physical, and  emotional health.    - Reviewed the "spokes of the wheel" of mood and health management.  Stressed the importance of ongoing prudent habits, including regular exercise, appropriate sleep hygiene, healthful dietary habits, and prayer/meditation to relax.  - Encouraged patient to engage in daily physical activity, especially a formal exercise routine.  Recommended that the patient eventually strive for at least 150 minutes of moderate cardiovascular activity per week according to guidelines established by the [facility].   - Healthy dietary habits encouraged, including low-carb, and high amounts of lean protein in diet.   - Patient should also consume adequate amounts of water.  5. Follow-Up - Prescriptions refilled today PRN. - Patient plans to call her insurance and ask questions about coverage. - Patient knows to let us know about progress on her change in BP medication. - Re-check fasting lab work as recommended. - Otherwise, continue to return for CPE and chronic follow-up as scheduled.   - Patient knows to call in sooner if desired to address acute concerns.    Meds ordered this encounter  Medications  . ezetimibe (ZETIA) 10 MG tablet    Sig: Take 1 tablet (10 mg total) by mouth daily.    Dispense:  90 tablet    Refill:  1  . Vitamin D, Ergocalciferol, (DRISDOL) 1.25 MG (50000 UT) CAPS capsule    Sig: Take 1 capsule (50,000 Units total) by mouth every 7 (seven) days.    Dispense:  12 capsule    Refill:  10  . Olmesartan-amLODIPine-HCTZ 20-5-12.5 MG TABS    Sig: Take 1 tablet by mouth daily.    Dispense:  90 tablet  Refill:  1    Medications Discontinued During This Encounter  Medication Reason  . olmesartan (BENICAR) 40 MG tablet   . hydrochlorothiazide (HYDRODIURIL) 12.5 MG tablet   . ezetimibe (ZETIA) 10 MG tablet Reorder  . Vitamin D, Ergocalciferol, (DRISDOL) 50000 units CAPS capsule Reorder     Gross side effects, risk and benefits, and alternatives of medications and  treatment plan in general discussed with patient.  Patient is aware that all medications have potential side effects and we are unable to predict every side effect or drug-drug interaction that may occur.   Patient will call with any questions prior to using medication if they have concerns.    Expresses verbal understanding and consents to current therapy and treatment regimen.  No barriers to understanding were identified.  Red flag symptoms and signs discussed in detail.  Patient expressed understanding regarding what to do in case of emergency\urgent symptoms  Please see AVS handed out to patient at the end of our visit for further patient instructions/ counseling done pertaining to today's office visit.   Return in about 3 months (around 04/26/2018) for (2) HTN- started new BP med- bring in BP log.     Note:  This note was prepared with assistance of Dragon voice recognition software. Occasional wrong-word or sound-a-like substitutions may have occurred due to the inherent limitations of voice recognition software.   This document serves as a record of services personally performed by Mellody Dance, DO. It was created on her behalf by Toni Amend, a trained medical scribe. The creation of this record is based on the scribe's personal observations and the provider's statements to them.   I have reviewed the above medical documentation for accuracy and completeness and I concur.  Mellody Dance, DO 01/24/2018 9:31 PM       ---------------------------------------------------------------------------------------------------------------------------------    Subjective:     HPI: Bailey [name] is a 67 y.o. female who presents to [facility] today for issues as discussed below.  Patient denies feeling anxiety.  Says "I feel anxiety sometimes when things aren't going my way, like with my blood pressure, but not in general."  States she's not  exercising like she should, "but I'm up [on my feet] all the time."  Feels she exercises an average of two times per week.  She uses the recumbent bike twice weekly.  She continues on her medications as prescribed.  1. HTN HPI:  -  Her blood pressure has not been optimally controlled at home.  Pt is rarely checking it at home.  Patients states it still runs "about 140'ish, 142-147/85-90."  She is checking her blood pressure on average once per week.   Stopped taking her HCTZ and feels the Benicar may not be helping her.  States she was "going to the bathroom too much" on the HCTZ.  Once she stopped taking it, she noticed going to the bathroom much less.  "I really didn't pay attention, but I did notice that I didn't have to go to the bathroom as much."  - Patient reports fair compliance with blood pressure medications  - Denies medication S-E.   - Smoking Status noted.  - She denies new onset of: chest pain, exercise intolerance, shortness of breath, dizziness, visual changes, headache, lower extremity swelling or claudication.   Last 3 blood pressure readings in our office are as follows: BP Readings from Last 3 Encounters:  01/24/18 (!) 178/84  07/14/17 138/80  06/23/17 (!) 151/80  Filed Weights   01/24/18 1429  Weight: 203 lb 4.8 oz (92.2 kg)    Wt Readings from Last 3 Encounters:  01/24/18 203 lb 4.8 oz (92.2 kg)  07/14/17 198 lb 9.6 oz (90.1 kg)  06/23/17 199 lb 11.2 oz (90.6 kg)    Pulse Readings from Last 3 Encounters:  01/24/18 65  07/14/17 63  06/23/17 60   BMI Readings from Last 3 Encounters:  01/24/18 32.81 kg/m  07/14/17 32.05 kg/m  06/23/17 32.23 kg/m     Patient Care Team    Relationship Specialty Notifications Start End  Mellody Dance, DO PCP - General Family Medicine  12/05/15   Marchia Bond, MD  Orthopedic Surgery  04/07/12   Ladene Artist, MD  Gastroenterology  07/27/12      Patient Active Problem List   Diagnosis Date Noted  .  Obesity (BMI 30-39.9) 06/22/2013    Priority: High  . Dyslipidemia 09/27/2008    Priority: High  . Essential hypertension 06/28/2008    Priority: High  . Chronic Fatigue 07/27/2014    Priority: Medium  . Chronic constipation- sees GI 07/27/2014    Priority: Medium  . Hx of cardiovascular stress test 12/22/2015    Priority: Low  . Vitamin D deficiency 12/22/2015    Priority: Low  . 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 Intolerance of drug-  will not take statin 07/14/2017  . 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 Inactivity 07/14/2017  . Dysuria 06/23/2017  . Abnormal urinalysis 06/23/2017  . Chronic pain of right knee 12/21/2016  . Abnormality of heart beat-  08/17/2016  . Acute reaction to situational stress 08/17/2016  . GAD (generalized anxiety disorder) 08/17/2016  . Hematuria 03/13/2016  . Pelvic pressure in female 03/13/2016  . Osteoarthritis of knee, unspecified 10/29/2014  . Rectus diastasis 08/09/2013  . Obesity 06/22/2013  . Gallstone 07/27/2012  . Acute diverticulitis 07/27/2012  . Partial tear of subscapularis tendon 04/22/2012  . Supraspinatus tendon tear 04/22/2012  . Trochanteric bursitis of right hip   . Asymptomatic postmenopausal status 01/04/2009  . Hyperlipidemia 09/27/2008    Past Medical history, Surgical history, Family history, Social history, Allergies and Medications have been entered into the medical record, reviewed and changed as needed.    Current Meds  Medication Sig  . aspirin EC 81 MG tablet Take 1 tablet (81 mg total) by mouth daily.  Marland Kitchen ezetimibe (ZETIA) 10 MG tablet Take 1 tablet (10 mg total) by mouth daily.  . fluticasone (FLONASE) 50 MCG/ACT nasal spray Place 2 sprays into both nostrils daily.  . Vitamin D, Ergocalciferol, (DRISDOL) 1.25 MG (50000 UT) CAPS capsule Take 1 capsule (50,000 Units total) by mouth every 7 (seven) days.  . [DISCONTINUED] ezetimibe (ZETIA) 10 MG tablet TAKE 1 TABLET BY  MOUTH EVERY DAY  . [DISCONTINUED] olmesartan (BENICAR) 40 MG tablet TAKE 1 TABLET (40 MG TOTAL) BY MOUTH DAILY. NEEDS APPOINTMENT WITH PCP  . [DISCONTINUED] Vitamin D, Ergocalciferol, (DRISDOL) 50000 units CAPS capsule Take 1 capsule (50,000 Units total) by mouth every 7 (seven) days.    Allergies:  Allergies  Allergen Reactions  . Codeine Nausea Only  . Statins      Review of Systems:  A fourteen system review of systems was performed and found to be positive as per HPI.   Objective:   Blood pressure (!) 178/84, pulse 65, temperature 97.7 F (36.5 C), height 5\' 6"  (1.676 m), weight 203 lb  4.8 oz (92.2 kg), SpO2 99 %. Body mass index is 32.81 kg/m. General:  Well Developed, well nourished, appropriate for stated age.  Neuro:  Alert and oriented,  extra-ocular muscles intact  HEENT:  Normocephalic, atraumatic, neck supple, no carotid bruits appreciated  Skin:  no gross rash, warm, pink. Cardiac:  RRR, S1 S2 Respiratory:  ECTA B/L and A/P, Not using accessory muscles, speaking in full sentences- unlabored. Vascular:  Ext warm, no cyanosis apprec.; cap RF less 2 sec. Psych:  No HI/SI, judgement and insight good, Euthymic mood. Full Affect.

## 2018-02-03 ENCOUNTER — Telehealth: Payer: Self-pay | Admitting: Family Medicine

## 2018-02-03 NOTE — Telephone Encounter (Signed)
Called and left message for the patient to call the office. MPulliam, CMA/RT(R)  

## 2018-02-03 NOTE — Telephone Encounter (Signed)
Patient is sch Monday for an acute sick visit for what she thinks is a chest cold, she wants to speak with nurse briefly about some home remedies or OTC meds that may help. Patient is on c/x list for tomorrow to get bumped up if we get availability.

## 2018-02-04 ENCOUNTER — Telehealth: Payer: Self-pay | Admitting: Family Medicine

## 2018-02-04 NOTE — Telephone Encounter (Signed)
Patient called 12/5 left message for medical assistant to call her w/ suggestions for Severe Cold symptoms.  --Nurse/ medical asst clld patient to speak w/ patient who did not answer, MA left voicemail for patient to call her on next day Friday, 02/04/18.  --Patient called 12/6 --forwarding message to medical asst.  --glh

## 2018-02-04 NOTE — Telephone Encounter (Signed)
Spoke to the patient advised to do saline rinses, Flonase.  Plain Mucinex and humifier.  If fevers or SOB or worse symptoms before Monday to go to Urgent Care. MPulliam, CMA/RT(R)

## 2018-02-07 ENCOUNTER — Ambulatory Visit: Payer: PPO | Admitting: Family Medicine

## 2018-04-07 DIAGNOSIS — L658 Other specified nonscarring hair loss: Secondary | ICD-10-CM | POA: Diagnosis not present

## 2018-04-07 DIAGNOSIS — L661 Lichen planopilaris: Secondary | ICD-10-CM | POA: Diagnosis not present

## 2018-04-28 ENCOUNTER — Ambulatory Visit: Payer: PPO | Admitting: Family Medicine

## 2018-05-25 ENCOUNTER — Ambulatory Visit: Payer: PPO | Admitting: Family Medicine

## 2018-07-08 ENCOUNTER — Other Ambulatory Visit: Payer: Self-pay | Admitting: Family Medicine

## 2018-07-08 DIAGNOSIS — I10 Essential (primary) hypertension: Secondary | ICD-10-CM

## 2018-07-18 ENCOUNTER — Other Ambulatory Visit: Payer: Self-pay | Admitting: Family Medicine

## 2018-07-18 ENCOUNTER — Other Ambulatory Visit: Payer: Self-pay

## 2018-07-18 ENCOUNTER — Telehealth: Payer: Self-pay | Admitting: Family Medicine

## 2018-07-18 DIAGNOSIS — E785 Hyperlipidemia, unspecified: Secondary | ICD-10-CM

## 2018-07-18 DIAGNOSIS — I10 Essential (primary) hypertension: Secondary | ICD-10-CM

## 2018-07-18 MED ORDER — EZETIMIBE 10 MG PO TABS: 10.0000 mg | ORAL_TABLET | Freq: Every day | ORAL | 0 refills | Status: DC

## 2018-07-18 MED ORDER — OLMESARTAN-AMLODIPINE-HCTZ 20-5-12.5 MG PO TABS: 1.0000 | ORAL_TABLET | Freq: Every day | ORAL | 0 refills | Status: DC

## 2018-07-18 NOTE — Telephone Encounter (Signed)
30 day supply sent in per office policy.  Called patient to notify - unable to reach the patient. MPulliam, CMA/RT(R)

## 2018-07-18 NOTE — Telephone Encounter (Signed)
Pt set up virtual OV on Wed- 5/27 but needs Rx refill on : (   Olmesartan-amLODIPine-HCTZ 20-5-12.5 MG TABS [[id number]]   Order Details  Dose: 1 tablet Route: Oral Frequency: Daily  Dispense Quantity: 90 tablet Refills: 1 Fills remaining: --        Sig: Take 1 tablet by mouth daily.     &   ezetimibe (ZETIA) 10 MG tablet [[id number]]   Order Details  Dose: 10 mg Route: Oral Frequency: Daily  Dispense Quantity: 90 tablet Refills: 1 Fills remaining: --        Sig: Take 1 tablet (10 mg total) by mouth daily.     ----Forwarding request to medical assistant that pt is ( GOING out of [place] ) & needs refill order sent to :   [id number] - [place], [place] [phone] (Phone) [phone] (Fax)   --glh

## 2018-07-27 ENCOUNTER — Other Ambulatory Visit: Payer: Self-pay

## 2018-07-27 ENCOUNTER — Ambulatory Visit (INDEPENDENT_AMBULATORY_CARE_PROVIDER_SITE_OTHER): Payer: Medicare Other | Admitting: Family Medicine

## 2018-07-27 ENCOUNTER — Encounter: Payer: Self-pay | Admitting: Family Medicine

## 2018-07-27 VITALS — BP 108/85 | HR 71 | Temp 96.0°F | Ht 66.0 in | Wt 199.3 lb

## 2018-07-27 DIAGNOSIS — E559 Vitamin D deficiency, unspecified: Secondary | ICD-10-CM | POA: Diagnosis not present

## 2018-07-27 DIAGNOSIS — E785 Hyperlipidemia, unspecified: Secondary | ICD-10-CM | POA: Diagnosis not present

## 2018-07-27 DIAGNOSIS — R319 Hematuria, unspecified: Secondary | ICD-10-CM

## 2018-07-27 DIAGNOSIS — Z723 Lack of physical exercise: Secondary | ICD-10-CM

## 2018-07-27 DIAGNOSIS — I10 Essential (primary) hypertension: Secondary | ICD-10-CM | POA: Diagnosis not present

## 2018-07-27 DIAGNOSIS — R102 Pelvic and perineal pain: Secondary | ICD-10-CM

## 2018-07-27 DIAGNOSIS — R351 Nocturia: Secondary | ICD-10-CM

## 2018-07-27 DIAGNOSIS — R32 Unspecified urinary incontinence: Secondary | ICD-10-CM

## 2018-07-27 MED ORDER — EZETIMIBE 10 MG PO TABS: 10.0000 mg | ORAL_TABLET | Freq: Every day | ORAL | 0 refills | Status: DC

## 2018-07-27 MED ORDER — OLMESARTAN-AMLODIPINE-HCTZ 20-5-12.5 MG PO TABS: 1.0000 | ORAL_TABLET | Freq: Every day | ORAL | 0 refills | Status: DC

## 2018-07-27 NOTE — Progress Notes (Signed)
Virtual / live video office visit note for [employer], D.O- Primary Care Physician at [facility]   I connected with current patient today and beyond visually recognizing the correct individual, I verified that I am speaking with the correct person using two identifiers.   Location of the patient: Home  Location of the provider: Office Only the patient (+/- their family members at pt's discretion) and myself were participating in the encounter    - This visit type was conducted due to national recommendations for restrictions regarding the COVID-19 Pandemic (e.g. social distancing) in an effort to limit this patient's exposure and mitigate transmission in our community.  This format is felt to be most appropriate for this patient at this time.   - The patient did have access to video technology today  - No physical exam could be performed with this format, beyond that communicated to [place] by the patient/ family members as noted.   - Additionally my office staff/ schedulers discussed with the patient that there may be a monetary charge related to this service, depending on patient's medical insurance.   The patient expressed understanding, and agreed to proceed.      History of Present Illness:  Last OV 01/24/18:   Hypertension:    - Blood pressure at that time was 178/84.  We started patient on a combination therapy last office visit.  DOing well on it.  She was told to follow-up with [place] in 3 months which would be 04/26/2018 for starting the new blood pressure medicine and to bring in a blood pressure log.  Because she falls out of the realm where she cannot get her blood pressure pills, she then had to make her office visit today this is what she is here for.  At home- 123/83, 130/82, feb 11- 129/74, P74;  feb12- 121/78, 15th- 128/67,  Feb 23- 133/74,  24th- 114/75 P-60's-70's, In march 113/76, 123/71, 112/82  Within past 1.35yrs- getting up more at night- sometimes can't reach the  commode b-4 she is peeing. No pain/ blood etc.  Been going 3-4 times per night.  Husband sees Dr Jeffie Pollock  Back in 90's- had bladder stretched  Vit D- once wkly  Prescription.  Last vit D was 11/2016 at 29.1.    Impression and Recommendations:    1. Essential hypertension   2. Hyperlipidemia, unspecified hyperlipidemia type   3. Vitamin D deficiency   4. Inactivity   5. Hematuria, unspecified type-unknown etiology-chronic   6. Nocturia-chronic   7. Incontinence in female   8. Pelvic pressure in female     -Blood pressure well controlled continue medicines.-Needs CMP done -Only 30-day supply of cholesterol and blood pressure medicine were given to patient today since we have not obtained labs on her in a year and a half or more.  She has been asked to come in and has not.   - She will come in in 3 weeks for fasting blood work and prefers to see me to discuss the results-she did not want to just be called about them if there were any abnormalities.   -She understands no more medicines will be dispensed until labs are obtained. -I asked my CMA to please put a full set of fasting blood work as well as a urinalysis for history of nocturia, and hematuria. -For incontinence, chronic nocturia and chronic hematuria-we will send to urology especially since she had some kind of bladder surgery in the past and this  could be contributing to her issues.  Once we know work-up is clear, then perhaps something like a Ditropan would be appropriate.  We will await their input -Spoke with patient face-to-face directly for over 30 minutes today, patient has a lot of questions and concerns about medicines, why she should take them, if they can cause side effects etc.  - As part of my medical decision making, I reviewed the following data within the [place] History obtained from pt /family, CMA notes reviewed and incorporated if applicable, Labs reviewed, Radiograph/ tests reviewed if applicable  and OV notes from prior OV's with me, as well as other specialists she/he has seen since seeing me last, were all reviewed and used in my medical decision making process today.   - Additionally, discussion had with patient regarding txmnt plan, their biases about that plan etc were used in my medical decision making today.   - The patient agreed with the plan and demonstrated an understanding of the instructions.   No barriers to understanding were identified.   - Red flag symptoms and signs discussed in detail.  Patient expressed understanding regarding what to do in case of emergency\ urgent symptoms.  The patient was advised to call back or seek an in-person evaluation if the symptoms worsen or if the condition fails to improve as anticipated.   Return for fbw 3 d prior to 2)  f/up ov in about in 3wks- BP, CHOL, vit D, .    Orders Placed This Encounter  Procedures   Ambulatory referral to Urology    Meds ordered this encounter  Medications   Olmesartan-amLODIPine-HCTZ 20-5-12.5 MG TABS    Sig: Take 1 tablet by mouth daily.    Dispense:  30 tablet    Refill:  0    No RF until labs done   ezetimibe (ZETIA) 10 MG tablet    Sig: Take 1 tablet (10 mg total) by mouth at bedtime.    Dispense:  30 tablet    Refill:  0    No RF until labs done    Medications Discontinued During This Encounter  Medication Reason   Olmesartan-amLODIPine-HCTZ 20-5-12.5 MG TABS Reorder   ezetimibe (ZETIA) 10 MG tablet Reorder      I provided 30++ minutes of non-face-to-face time during this encounter,with over 50% of the time in direct counseling on patients medical conditions/ medical concerns.  Additional time was spent with charting and coordination of care after the actual visit commenced.   Note:  This note was prepared with assistance of Dragon voice recognition software. Occasional wrong-word or sound-a-like substitutions may have occurred due to the inherent limitations of voice recognition  software.  Mellody Dance, DO     Patient Care Team    Relationship Specialty Notifications Start End  Mellody Dance, DO PCP - General Family Medicine  12/05/15   Marchia Bond, MD  Orthopedic Surgery  04/07/12   Ladene Artist, MD  Gastroenterology  07/27/12     -Vitals obtained; medications/ allergies reconciled;  personal medical, social, Sx etc.histories were updated by CMA, reviewed by me and are reflected in chart  Patient Active Problem List   Diagnosis Date Noted   Obesity (BMI 30-39.9) 06/22/2013    Priority: High   Dyslipidemia 09/27/2008    Priority: High   Hyperlipidemia 09/27/2008    Priority: High   Essential hypertension 06/28/2008    Priority: High   Hematuria 03/13/2016    Priority: Medium   Chronic Fatigue  07/27/2014    Priority: Medium   Chronic constipation- sees GI 07/27/2014    Priority: Medium   Intolerance of drug-  will not take statin 07/14/2017    Priority: Low   Acute reaction to situational stress 08/17/2016    Priority: Low   Hx of cardiovascular stress test 12/22/2015    Priority: Low   Vitamin D deficiency 12/22/2015    Priority: Low   Generalized OA 10/29/2014    Priority: Low   Asymptomatic postmenopausal status 01/04/2009    Priority: Low   Incontinence in female 07/27/2018   Nocturia-chronic 07/27/2018   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 07/14/2017   Inactivity 07/14/2017   Dysuria 06/23/2017   Abnormal urinalysis 06/23/2017   Chronic pain of right knee 12/21/2016   Abnormality of heart beat-  08/17/2016   GAD (generalized anxiety disorder) 08/17/2016   Pelvic pressure in female 03/13/2016   Osteoarthritis of knee, unspecified 10/29/2014   Rectus diastasis 08/09/2013   Obesity 06/22/2013   Gallstone 07/27/2012   Acute diverticulitis 07/27/2012   Partial tear of subscapularis tendon 04/22/2012   Supraspinatus tendon tear 04/22/2012   Trochanteric bursitis of right hip       Current Meds  Medication Sig   aspirin EC 81 MG tablet Take 1 tablet (81 mg total) by mouth daily.   ezetimibe (ZETIA) 10 MG tablet Take 1 tablet (10 mg total) by mouth at bedtime.   Olmesartan-amLODIPine-HCTZ 20-5-12.5 MG TABS Take 1 tablet by mouth daily.   Vitamin D, Ergocalciferol, (DRISDOL) 1.25 MG (50000 UT) CAPS capsule Take 1 capsule (50,000 Units total) by mouth every 7 (seven) days.   [DISCONTINUED] ezetimibe (ZETIA) 10 MG tablet Take 1 tablet (10 mg total) by mouth daily.   [DISCONTINUED] Olmesartan-amLODIPine-HCTZ 20-5-12.5 MG TABS Take 1 tablet by mouth daily.     Allergies  Allergen Reactions   Codeine Nausea Only   Statins      ROS:  See above HPI for pertinent positives and negatives   Objective:   Blood pressure 108/85, pulse 71, temperature (!) 96 F (35.6 C), height 5\' 6"  (1.676 m), weight 199 lb 4.8 oz (90.4 kg).  (if some vitals are omitted, this means that patient was UNABLE to obtain them even though they were asked to get them prior to OV today.  They were asked to call us at their earliest convenience with these once obtained.)  General: A & O * 3; visually in no acute distress; in usual state of health.  Skin: Visible skin appears normal and pt's usual skin color HEENT:  EOMI, head is normocephalic and atraumatic.  Sclera are anicteric. Neck has a good range of motion.  Lips are noncyanotic Chest: normal chest excursion and movement Respiratory: speaking in full sentences, no conversational dyspnea; no use of accessory muscles Psych: insight good, mood- appears full

## 2018-08-02 ENCOUNTER — Other Ambulatory Visit: Payer: Self-pay

## 2018-08-02 ENCOUNTER — Other Ambulatory Visit (INDEPENDENT_AMBULATORY_CARE_PROVIDER_SITE_OTHER): Payer: Medicare Other

## 2018-08-02 DIAGNOSIS — E559 Vitamin D deficiency, unspecified: Secondary | ICD-10-CM | POA: Diagnosis not present

## 2018-08-02 DIAGNOSIS — E669 Obesity, unspecified: Secondary | ICD-10-CM

## 2018-08-02 DIAGNOSIS — R5383 Other fatigue: Secondary | ICD-10-CM

## 2018-08-02 DIAGNOSIS — I10 Essential (primary) hypertension: Secondary | ICD-10-CM

## 2018-08-02 DIAGNOSIS — E785 Hyperlipidemia, unspecified: Secondary | ICD-10-CM

## 2018-08-03 LAB — CBC WITH DIFFERENTIAL/PLATELET
Basophils Absolute: 0.1 10*3/uL (ref 0.0–0.2)
Basos: 1 %
EOS (ABSOLUTE): 0.1 10*3/uL (ref 0.0–0.4)
Eos: 2 %
Hematocrit: 39.2 % (ref 34.0–46.6)
Hemoglobin: 14.6 g/dL (ref 11.1–15.9)
Immature Grans (Abs): 0 10*3/uL (ref 0.0–0.1)
Immature Granulocytes: 0 %
Lymphocytes Absolute: 1.8 10*3/uL (ref 0.7–3.1)
Lymphs: 27 %
MCH: 33.9 pg — ABNORMAL HIGH (ref 26.6–33.0)
MCHC: 37.2 g/dL — ABNORMAL HIGH (ref 31.5–35.7)
MCV: 91 fL (ref 79–97)
Monocytes Absolute: 0.5 10*3/uL (ref 0.1–0.9)
Monocytes: 8 %
Neutrophils Absolute: 4.1 10*3/uL (ref 1.4–7.0)
Neutrophils: 62 %
Platelets: 245 10*3/uL (ref 150–450)
RBC: 4.31 x10E6/uL (ref 3.77–5.28)
RDW: 12.1 % (ref 11.7–15.4)
WBC: 6.6 10*3/uL (ref 3.4–10.8)

## 2018-08-03 LAB — LIPID PANEL
Chol/HDL Ratio: 4.1 ratio (ref 0.0–4.4)
Cholesterol, Total: 203 mg/dL — ABNORMAL HIGH (ref 100–199)
HDL: 49 mg/dL (ref >39)
LDL Calculated: 131 mg/dL — ABNORMAL HIGH (ref 0–99)
Triglycerides: 114 mg/dL (ref 0–149)
VLDL Cholesterol Cal: 23 mg/dL (ref 5–40)

## 2018-08-03 LAB — VITAMIN D 25 HYDROXY (VIT D DEFICIENCY, FRACTURES): Vit D, 25-Hydroxy: 49.6 ng/mL (ref 30.0–100.0)

## 2018-08-03 LAB — COMPREHENSIVE METABOLIC PANEL
ALT: 18 IU/L (ref 0–32)
AST: 17 IU/L (ref 0–40)
Albumin/Globulin Ratio: 1.5 (ref 1.2–2.2)
Albumin: 4.4 g/dL (ref 3.8–4.8)
Alkaline Phosphatase: 85 IU/L (ref 39–117)
BUN/Creatinine Ratio: 20 (ref 12–28)
BUN: 17 mg/dL (ref 8–27)
Bilirubin Total: 0.5 mg/dL (ref 0.0–1.2)
CO2: 26 mmol/L (ref 20–29)
Calcium: 10.6 mg/dL — ABNORMAL HIGH (ref 8.7–10.3)
Chloride: 99 mmol/L (ref 96–106)
Creatinine, Ser: 0.85 mg/dL (ref 0.57–1.00)
GFR calc Af Amer: 82 mL/min/{1.73_m2} (ref >59)
GFR calc non Af Amer: 71 mL/min/{1.73_m2} (ref >59)
Globulin, Total: 2.9 g/dL (ref 1.5–4.5)
Glucose: 137 mg/dL — ABNORMAL HIGH (ref 65–99)
Potassium: 4.7 mmol/L (ref 3.5–5.2)
Sodium: 138 mmol/L (ref 134–144)
Total Protein: 7.3 g/dL (ref 6.0–8.5)

## 2018-08-03 LAB — T4, FREE: Free T4: 1.06 ng/dL (ref 0.82–1.77)

## 2018-08-03 LAB — VITAMIN B12: Vitamin B-12: 446 pg/mL (ref 232–1245)

## 2018-08-03 LAB — HEMOGLOBIN A1C
Est. average glucose Bld gHb Est-mCnc: 120 mg/dL
Hgb A1c MFr Bld: 5.8 % — ABNORMAL HIGH (ref 4.8–5.6)

## 2018-08-03 LAB — TSH: TSH: 2.5 u[IU]/mL (ref 0.450–4.500)

## 2018-08-05 DIAGNOSIS — R350 Frequency of micturition: Secondary | ICD-10-CM | POA: Diagnosis not present

## 2018-08-08 DIAGNOSIS — H00025 Hordeolum internum left lower eyelid: Secondary | ICD-10-CM | POA: Diagnosis not present

## 2018-08-15 ENCOUNTER — Ambulatory Visit: Payer: Medicare Other | Admitting: Family Medicine

## 2018-08-16 ENCOUNTER — Other Ambulatory Visit: Payer: Self-pay | Admitting: Family Medicine

## 2018-08-16 DIAGNOSIS — I10 Essential (primary) hypertension: Secondary | ICD-10-CM

## 2018-08-17 ENCOUNTER — Other Ambulatory Visit: Payer: Self-pay

## 2018-08-17 ENCOUNTER — Encounter: Payer: Self-pay | Admitting: Family Medicine

## 2018-08-17 ENCOUNTER — Ambulatory Visit (INDEPENDENT_AMBULATORY_CARE_PROVIDER_SITE_OTHER): Payer: Medicare Other | Admitting: Family Medicine

## 2018-08-17 VITALS — BP 125/88 | HR 70 | Temp 96.1°F | Ht 66.0 in | Wt 200.9 lb

## 2018-08-17 DIAGNOSIS — I10 Essential (primary) hypertension: Secondary | ICD-10-CM | POA: Diagnosis not present

## 2018-08-17 DIAGNOSIS — E559 Vitamin D deficiency, unspecified: Secondary | ICD-10-CM

## 2018-08-17 DIAGNOSIS — E785 Hyperlipidemia, unspecified: Secondary | ICD-10-CM | POA: Diagnosis not present

## 2018-08-17 DIAGNOSIS — R7303 Prediabetes: Secondary | ICD-10-CM | POA: Diagnosis not present

## 2018-08-17 DIAGNOSIS — R319 Hematuria, unspecified: Secondary | ICD-10-CM

## 2018-08-17 MED ORDER — EZETIMIBE 10 MG PO TABS: 10.0000 mg | ORAL_TABLET | Freq: Every day | ORAL | 0 refills | Status: DC

## 2018-08-17 MED ORDER — OLMESARTAN-AMLODIPINE-HCTZ 20-5-12.5 MG PO TABS: 1.0000 | ORAL_TABLET | Freq: Every day | ORAL | 1 refills | Status: DC

## 2018-08-17 NOTE — Progress Notes (Signed)
Virtual / live video office visit note for [employer], D.O- Primary Care Physician at [facility]   I connected with current patient today and beyond visually recognizing the correct individual, I verified that I am speaking with the correct person using two identifiers.  . Location of the patient: Home . Location of the provider: Office Only the patient (+/- their family members at pt's discretion) and myself were participating in the encounter    - This visit type was conducted due to national recommendations for restrictions regarding the COVID-19 Pandemic (e.g. social distancing) in an effort to limit this patient's exposure and mitigate transmission in our community.  This format is felt to be most appropriate for this patient at this time.   - The patient did have access to video technology today yet, we had technical difficulties with this method, requiring transitioning to audio only.    - No physical exam could be performed with this format, beyond that communicated to [place] by the patient/ family members as noted.   - Additionally my office staff/ schedulers discussed with the patient that there may be a monetary charge related to this service, depending on patient's medical insurance.   The patient expressed understanding, and agreed to proceed.      History of Present Illness:  Patient last seen 07/27/2018-for blood pressure, cholesterol etc.  She is here today today to discuss recent labs and she has not had them in over a year and a half or more.  HTN:  bp at home- running  120-135/  78-88; nothing over that really- but rare  CHol: intol to statins per pt.  Only tried on lipitor 10mg  onew time asfter review of chart- back in 2012.  Had leg cramps-which she also says states comes from after doing excess work and extra work around the house though.  She also had a history of nocturia and hematuria and a UA was obtained as well.  We also sent her to urology at that time.   -Elevated serum calcium to 10.6 today.  Last done 1018 of 2018 and was at 10.2 prior to that in 07/2014 was 10.3.   Recent Results (from the past 2160 hour(s))  Comprehensive metabolic panel     Status: Abnormal   Collection Time: 08/02/18 10:02 AM  Result Value Ref Range   Glucose 137 (H) 65 - 99 mg/dL   BUN 17 8 - 27 mg/dL   Creatinine, Ser 0.85 0.57 - 1.00 mg/dL   GFR calc non Af Amer 71 >59 mL/min/1.73   GFR calc Af Amer 82 >59 mL/min/1.73   BUN/Creatinine Ratio 20 12 - 28   Sodium 138 134 - 144 mmol/L   Potassium 4.7 3.5 - 5.2 mmol/L   Chloride 99 96 - 106 mmol/L   CO2 26 20 - 29 mmol/L   Calcium 10.6 (H) 8.7 - 10.3 mg/dL   Total Protein 7.3 6.0 - 8.5 g/dL   Albumin 4.4 3.8 - 4.8 g/dL   Globulin, Total 2.9 1.5 - 4.5 g/dL   Albumin/Globulin Ratio 1.5 1.2 - 2.2   Bilirubin Total 0.5 0.0 - 1.2 mg/dL   Alkaline Phosphatase 85 39 - 117 IU/L   AST 17 0 - 40 IU/L   ALT 18 0 - 32 IU/L  CBC with Differential/Platelet     Status: Abnormal   Collection Time: 08/02/18 10:02 AM  Result Value Ref Range   WBC 6.6 3.4 - 10.8 x10E3/uL   RBC  4.31 3.77 - 5.28 x10E6/uL   Hemoglobin 14.6 11.1 - 15.9 g/dL   Hematocrit 39.2 34.0 - 46.6 %   MCV 91 79 - 97 fL   MCH 33.9 (H) 26.6 - 33.0 pg   MCHC 37.2 (H) 31.5 - 35.7 g/dL   RDW 12.1 11.7 - 15.4 %   Platelets 245 150 - 450 x10E3/uL   Neutrophils 62 Not Estab. %   Lymphs 27 Not Estab. %   Monocytes 8 Not Estab. %   Eos 2 Not Estab. %   Basos 1 Not Estab. %   Neutrophils Absolute 4.1 1.4 - 7.0 x10E3/uL   Lymphocytes Absolute 1.8 0.7 - 3.1 x10E3/uL   Monocytes Absolute 0.5 0.1 - 0.9 x10E3/uL   EOS (ABSOLUTE) 0.1 0.0 - 0.4 x10E3/uL   Basophils Absolute 0.1 0.0 - 0.2 x10E3/uL   Immature Granulocytes 0 Not Estab. %   Immature Grans (Abs) 0.0 0.0 - 0.1 x10E3/uL   Hematology Comments: Note:     Comment: Verified by microscopic examination.  Hemoglobin A1c     Status: Abnormal   Collection Time: 08/02/18 10:02 AM  Result Value Ref Range   Hgb  A1c MFr Bld 5.8 (H) 4.8 - 5.6 %    Comment:          Prediabetes: 5.7 - 6.4          Diabetes: >6.4          Glycemic control for adults with diabetes: <7.0    Est. average glucose Bld gHb Est-mCnc 120 mg/dL  Lipid panel     Status: Abnormal   Collection Time: 08/02/18 10:02 AM  Result Value Ref Range   Cholesterol, Total 203 (H) 100 - 199 mg/dL   Triglycerides 114 0 - 149 mg/dL   HDL 49 >39 mg/dL   VLDL Cholesterol Cal 23 5 - 40 mg/dL   LDL Calculated 131 (H) 0 - 99 mg/dL   Chol/HDL Ratio 4.1 0.0 - 4.4 ratio    Comment:                                   T. Chol/HDL Ratio                                             Men  Women                               1/2 Avg.Risk  3.4    3.3                                   Avg.Risk  5.0    4.4                                2X Avg.Risk  9.6    7.1                                3X Avg.Risk 23.4   11.0   VITAMIN D 25 Hydroxy (Vit-D Deficiency, Fractures)     Status: None   Collection Time:  08/02/18 10:02 AM  Result Value Ref Range   Vit D, 25-Hydroxy 49.6 30.0 - 100.0 ng/mL    Comment: Vitamin D deficiency has been defined by the [place] practice guideline as a level of serum 25-OH vitamin D less than 20 ng/mL (1,2). The Endocrine Society went on to further define vitamin D insufficiency as a level between 21 and 29 ng/mL (2). 1. IOM (Institute of Medicine). 2010. Dietary reference    intakes for calcium and D. [place]: The    [employer]. 2. Holick MF, Binkley Hooper, Bischoff-Ferrari HA, et al.    Evaluation, treatment, and prevention of vitamin D    deficiency: an Endocrine Society clinical practice    guideline. JCEM. 2011 Jul; 96(7):1911-30.   TSH     Status: None   Collection Time: 08/02/18 10:02 AM  Result Value Ref Range   TSH 2.500 0.450 - 4.500 uIU/mL  T4, free     Status: None   Collection Time: 08/02/18 10:02 AM  Result Value Ref Range   Free T4 1.06 0.82 - 1.77  ng/dL  Vitamin B12     Status: None   Collection Time: 08/02/18 10:02 AM  Result Value Ref Range   Vitamin B-12 446 232 - 1,245 pg/mL     Impression and Recommendations:    1. Hyperlipidemia, unspecified hyperlipidemia type   2. Essential hypertension   3. Serum calcium elevated   4. Hematuria, unspecified type   5. Vitamin D deficiency   6. Prediabetes     1. Hyperlipidemia, unspecified hyperlipidemia type -LDL not at goal. - Patient declines change in medicine today to statin -she would like to continue the Zetia and try intensive dietary lifestyle changes.  -Go to American Heart Association website for further intensive information.   - We will recheck fasting lipid profile in 4 months and if LDL still above 100 I would recommend changing medicine to a statin again. -Patient had questions about why cholesterol is important and I discussed with patient what elevated LDL cholesterol can do to increase her risk heart attack, stroke and ultimately death.  All questions were answered. - ezetimibe (ZETIA) 10 MG tablet; Take 1 tablet (10 mg total) by mouth at bedtime. (needs ov 4 RF)  Dispense: 90 tablet; Refill: 0 - Amb Referral to Nutrition and Diabetic E  2. Essential hypertension Appears to be at goal.  Medications refilled.  Continue current medications and continue home blood pressure monitoring. - Olmesartan-amLODIPine-HCTZ 20-5-12.5 MG TABS; Take 1 tablet by mouth daily.  Dispense: 90 tablet; Refill: 1 - Amb Referral to Nutrition and Diabetic E  3. Prediabetes Explained to her what an A1c of 5.8 is.  This should be rechecked in 3 to 4 months. -Explained if she does not get better control of her diet, activity levels and weight loss etc., she can progress to a diabetic.  We will send her to nutrition counseling after discussion of treatment plans. - Amb Referral to Nutrition and Diabetic E  4. Vitamin D deficiency Levels within normal limits continue current supplements.  5.  Serum calcium elevated Slightly elevated.  Will recheck in 4 months along with fasting lipid profile and other labs.  6. Hematuria, unspecified type Per urology.     - As part of my medical decision making, I reviewed the following data within the [place] [place] [facility] History obtained from pt /family, CMA notes reviewed and incorporated if applicable, Labs reviewed, Radiograph/ tests reviewed  if applicable and OV notes from prior OV's with me, as well as other specialists she/he has seen since seeing me last, were all reviewed and used in my medical decision making process today.   - Additionally, discussion had with patient regarding txmnt plan, their biases about that plan etc were used in my medical decision making today.   - The patient agreed with the plan and demonstrated an understanding of the instructions.   No barriers to understanding were identified.   - Red flag symptoms and signs discussed in detail.  Patient expressed understanding regarding what to do in case of emergency\ urgent symptoms.  The patient was advised to call back or seek an in-person evaluation if the symptoms worsen or if the condition fails to improve as anticipated.   No follow-ups on file.    No orders of the defined types were placed in this encounter.   No orders of the defined types were placed in this encounter.   There are no discontinued medications.    I provided 18++ minutes of non-face-to-face time during this encounter,with over 50% of the time in direct counseling on patients medical conditions/ medical concerns.  Additional time was spent with charting and coordination of care after the actual visit commenced.   Note:  This note was prepared with assistance of Dragon voice recognition software. Occasional wrong-word or sound-a-like substitutions may have occurred due to the inherent limitations of voice recognition software.  Mellody Dance, DO     Patient Care Team     Relationship Specialty Notifications Start End  Mellody Dance, DO PCP - General Family Medicine  12/05/15   Marchia Bond, MD  Orthopedic Surgery  04/07/12   Ladene Artist, MD  Gastroenterology  07/27/12     -Vitals obtained; medications/ allergies reconciled;  personal medical, social, Sx etc.histories were updated by CMA, reviewed by me and are reflected in chart  Patient Active Problem List   Diagnosis Date Noted  . Obesity (BMI 30-39.9) 06/22/2013    Priority: High  . Dyslipidemia 09/27/2008    Priority: High  . Hyperlipidemia 09/27/2008    Priority: High  . Essential hypertension 06/28/2008    Priority: High  . Hematuria 03/13/2016    Priority: Medium  . Chronic Fatigue 07/27/2014    Priority: Medium  . Chronic constipation- sees GI 07/27/2014    Priority: Medium  . Intolerance of drug-  will not take statin 07/14/2017    Priority: Low  . Acute reaction to situational stress 08/17/2016    Priority: Low  . Hx of cardiovascular stress test 12/22/2015    Priority: Low  . Vitamin D deficiency 12/22/2015    Priority: Low  . 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 Asymptomatic postmenopausal status 01/04/2009    Priority: Low  . Prediabetes 08/17/2018  . Incontinence in female 07/27/2018  . Nocturia-chronic 07/27/2018  . 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 Inactivity 07/14/2017  . Dysuria 06/23/2017  . Abnormal urinalysis 06/23/2017  . Chronic pain of right knee 12/21/2016  . Abnormality of heart beat-  08/17/2016  . GAD (generalized anxiety disorder) 08/17/2016  . Pelvic pressure in female 03/13/2016  . Osteoarthritis of knee, unspecified 10/29/2014  . Rectus diastasis 08/09/2013  . Obesity 06/22/2013  . Gallstone 07/27/2012  . Acute diverticulitis 07/27/2012  . Partial tear of subscapularis tendon 04/22/2012  . Supraspinatus tendon tear 04/22/2012  . Trochanteric bursitis of right hip  Current Meds  Medication Sig   . aspirin EC 81 MG tablet Take 1 tablet (81 mg total) by mouth daily.  Marland Kitchen ezetimibe (ZETIA) 10 MG tablet Take 1 tablet (10 mg total) by mouth at bedtime.  . fluticasone (FLONASE) 50 MCG/ACT nasal spray Place 2 sprays into both nostrils daily.  . Olmesartan-amLODIPine-HCTZ 20-5-12.5 MG TABS Take 1 tablet by mouth daily.  . Vitamin D, Ergocalciferol, (DRISDOL) 1.25 MG (50000 UT) CAPS capsule Take 1 capsule (50,000 Units total) by mouth every 7 (seven) days.     Allergies  Allergen Reactions  . Codeine Nausea Only  . Statins      ROS:  See above HPI for pertinent positives and negatives   Objective:   Blood pressure 125/88, pulse 70, temperature (!) 96.1 F (35.6 C), height 5\' 6"  (1.676 m), weight 200 lb 14.4 oz (91.1 kg).  (if some vitals are omitted, this means that patient was UNABLE to obtain them even though they were asked to get them prior to OV today.  They were asked to call us at their earliest convenience with these once obtained.)  General: A & O * 3; visually in no acute distress; in usual state of health.  Skin: Visible skin appears normal and pt's usual skin color HEENT:  EOMI, head is normocephalic and atraumatic.  Sclera are anicteric. Neck has a good range of motion.  Lips are noncyanotic Chest: normal chest excursion and movement Respiratory: speaking in full sentences, no conversational dyspnea; no use of accessory muscles Psych: insight good, mood- appears full

## 2018-08-25 ENCOUNTER — Telehealth: Payer: Self-pay | Admitting: Family Medicine

## 2018-08-25 NOTE — Telephone Encounter (Signed)
Patient called and wanted to update PCP on nutrition referral that was placed. She was advised by the scheduler that Medicare would not pay for the class until her A1C reached 6.5 and that at this time would be about $200 out of pocket for the patient (which will be hard on her). She was told though that they offer free classes once a month that she will try to get scheduled for and attend. She wanted me to send this message if there was any advice or literature that Dr. Jenetta Downer would like to provide in the mean time. Please advise

## 2018-08-29 NOTE — Telephone Encounter (Signed)
Called left message for patient to call the office. MPulliam, CMA/RT(R)  

## 2018-08-30 NOTE — Telephone Encounter (Signed)
Called patient left message to call the office. MPulliam, CMA/RT(R)  

## 2018-08-31 NOTE — Telephone Encounter (Signed)
Spoke to patient and advised her to go to the www.diabetes.org site for diet information.  Patient states that she is starting a free virtual class for nutrition later this month. MPulliam, CMA/RT(R)

## 2018-09-10 ENCOUNTER — Encounter: Payer: Self-pay | Admitting: Gastroenterology

## 2018-09-16 ENCOUNTER — Ambulatory Visit: Payer: Medicare Other

## 2018-10-07 ENCOUNTER — Encounter: Payer: Self-pay | Admitting: Registered"

## 2018-10-07 ENCOUNTER — Encounter: Payer: Medicare Other | Attending: Family Medicine | Admitting: Registered"

## 2018-10-07 DIAGNOSIS — R7303 Prediabetes: Secondary | ICD-10-CM | POA: Diagnosis not present

## 2018-10-07 NOTE — Progress Notes (Signed)
On 10/07/2018 patient completed Core Session 1 of Diabetes Prevention Program course virtually with [facility]. The following learning objectives were met by the patient during this class:   Learning Objectives:  Be able to explain the purpose and benefits of the National Diabetes Prevention Program.   Be able to describe the events that will take place at every session.   Know the weight loss and physical activity goals established by the National Diabetes Prevention Program.   Know their own individual weight loss and physical activity goals.   Be able to explain the important effect of self-monitoring on behavior change.   Goals:   Record food and beverage intake in "Food and Activity Tracker" over the next week.   E-mail completed "Food and Activity Tracker" to Lifestyle Coach next week before session 2.  Circle the foods or beverages you think are highest in fat and calories in your food tracker.  Read the labels on the food you buy, and consider using measuring cups and spoons to help you calculate the amount you eat. We will talk about measuring in more detail in the coming weeks.   Follow-Up Plan:  Attend Core Session 2 next week.   E-mail completed "Food and Activity Tracker" to Lifestyle Coach next week before class  

## 2018-10-14 ENCOUNTER — Encounter (HOSPITAL_BASED_OUTPATIENT_CLINIC_OR_DEPARTMENT_OTHER): Payer: Medicare Other | Admitting: Registered"

## 2018-10-14 ENCOUNTER — Encounter: Payer: Self-pay | Admitting: Registered"

## 2018-10-14 DIAGNOSIS — R7303 Prediabetes: Secondary | ICD-10-CM

## 2018-10-14 NOTE — Progress Notes (Signed)
On 10/14/2018 patient completed Core Session 2 of Diabetes Prevention Program course virtually with [facility]. The following learning objectives were met by the patient during this class:   Learning Objectives:  Self-monitor their weight during the weeks following Session 2.   Describe the relationship between fat and calories.   Explain the reason for, and basic principles of, self-monitoring fat grams and calories.   Identify their personal fat gram goals.   Use the ?Fat and Calorie Counter to calculate the calories and fat grams of a given selection of foods.   Keep a running total of the fat grams they eat each day.   Calculate fat, calories, and serving sizes from nutrition labels.   Goals:   Weigh yourself at the same time each day, or every few days, and record your weight in your Food and Activity Tracker.  Write down everything you eat and drink in your Food and Activity Tracker.  Measure portions as much as you can, and start reading labels.   Use the ?Fat and Calorie Counter to figure out the amount of fat and calories in what you ate, and write the amount down in your Food and Activity Tracker.  Keep a running fat gram total throughout the day. Come as close to your fat gram goal as you can.   Follow-Up Plan:  Attend Core Session 3 next week.   Email completed  "Food and Activity Tracker" to Lifestyle Coach next week.

## 2018-10-21 ENCOUNTER — Ambulatory Visit (HOSPITAL_BASED_OUTPATIENT_CLINIC_OR_DEPARTMENT_OTHER): Payer: Medicare Other | Admitting: Registered"

## 2018-10-21 DIAGNOSIS — R7303 Prediabetes: Secondary | ICD-10-CM

## 2018-10-27 ENCOUNTER — Encounter: Payer: Self-pay | Admitting: Registered"

## 2018-10-27 NOTE — Progress Notes (Signed)
On 10/21/2018 patient completed Core Session 3 of Diabetes Prevention Program course virtually with [facility]. The following learning objectives were met by the patient during this class:    Learning Objectives:  Weigh and measure foods.  Estimate the fat and calorie content of common foods.  Describe three ways to eat less fat and fewer calories.  Create a plan to eat less fat for the following week.   Goals:   Track weight when weighing outside of class.   Track food and beverages eaten each day in Food and Activity Tracker and include fat grams and calories for each.   Try to stay within fat gram goal.   Complete plan for eating less high fat foods and answer related homework questions.    Follow-Up Plan:  Attend Core Session 4 next week.   Bring completed "Food and Activity Tracker" next week to be reviewed by Lifestyle Coach.

## 2018-10-28 ENCOUNTER — Encounter: Payer: Self-pay | Admitting: Registered"

## 2018-10-28 ENCOUNTER — Encounter (HOSPITAL_BASED_OUTPATIENT_CLINIC_OR_DEPARTMENT_OTHER): Payer: Medicare Other | Admitting: Registered"

## 2018-10-28 DIAGNOSIS — R7303 Prediabetes: Secondary | ICD-10-CM | POA: Diagnosis not present

## 2018-10-28 NOTE — Progress Notes (Addendum)
On 10/28/2018 patient completed Core Session 4 of Diabetes Prevention Program course virtually with [facility]. The following learning objectives were met by the patient during this class:    Learning Objectives:  Explain the health benefits of eating less fat and fewer calories.  Describe the MyPlate food guide and its recommendations, including how to reduce fat and calories in our diet.  Compare and contrast MyPlate guidelines with participants' eating habits.  List ways to replace high-fat and high-calorie foods with low-fat and low-calorie foods.  Explain the importance of eating plenty of whole grains, vegetables, and fruits, while staying within fat gram goals.  Explain the importance of eating foods from all groups of MyPlate and of eating a variety of foods from within each group.  Explain why a balanced diet is beneficial to health.  Explain why eating the same foods over and over is not the best strategy for long-term success.   Goals:   Record weight taken outside of class.   Track foods and beverages eaten each day in the "Food and Activity Tracker," including calories and fat grams for each item.   Practice comparing what you eat with the recommendations of MyPlate using the "Rate Your Plate" handout.   Complete the "Rate Your Plate" handout form on at least 3 days.   Answer homework questions.   Follow-Up Plan:  Attend Core Session 5 next week.   Bring completed "Food and Activity Tracker" next week to be reviewed by Lifestyle Coach.   

## 2018-11-04 ENCOUNTER — Encounter: Payer: Medicare Other | Attending: Family Medicine | Admitting: Registered"

## 2018-11-04 DIAGNOSIS — R7303 Prediabetes: Secondary | ICD-10-CM | POA: Insufficient documentation

## 2018-11-09 ENCOUNTER — Ambulatory Visit: Payer: Medicare Other | Admitting: Registered"

## 2018-11-11 ENCOUNTER — Encounter (HOSPITAL_BASED_OUTPATIENT_CLINIC_OR_DEPARTMENT_OTHER): Payer: Medicare Other | Admitting: Registered"

## 2018-11-11 DIAGNOSIS — R7303 Prediabetes: Secondary | ICD-10-CM | POA: Diagnosis not present

## 2018-11-15 ENCOUNTER — Encounter: Payer: Self-pay | Admitting: Registered"

## 2018-11-15 ENCOUNTER — Ambulatory Visit (HOSPITAL_BASED_OUTPATIENT_CLINIC_OR_DEPARTMENT_OTHER): Payer: Medicare Other | Admitting: Registered"

## 2018-11-15 DIAGNOSIS — H0011 Chalazion right upper eyelid: Secondary | ICD-10-CM | POA: Diagnosis not present

## 2018-11-15 DIAGNOSIS — R7303 Prediabetes: Secondary | ICD-10-CM

## 2018-11-15 NOTE — Progress Notes (Signed)
On 11/15/2018 patient completed Core Session 5 of Diabetes Prevention Program course virtually with [facility]. The following learning objectives were met by the patient during this class:   Learning Objectives:  Establish a physical activity goal.  Explain the importance of the physical activity goal.  Describe their current level of physical activity.  Name ways that they are already physically active.  Develop personal plans for physical activity for the next week.   Goals:   Record weight taken outside of class.   Track foods and beverages eaten each day in the "Food and Activity Tracker," including calories and fat grams for each item.   Make an Activity Plan including date, specific type of activity, and length of time you plan to be active that includes at last 60 minutes of activity for the week.   Track activity type, minutes you were active, and distance you reached each day in the "Food and Activity Tracker."   Follow-Up Plan: . Attend Core Session next week.  . E-mail completed "Food and Activity Tracker" to Lifestyle Coach next week before class

## 2018-11-16 ENCOUNTER — Encounter: Payer: Self-pay | Admitting: Registered"

## 2018-11-16 NOTE — Progress Notes (Signed)
On 11/11/2018 patient completed Core Session 6 of Diabetes Prevention Program course virtually with [facility]. The following learning objectives were met by the patient during this class:   Learning Objectives:  Graph their daily physical activity.   Describe two ways of finding the time to be active.   Define "lifestyle activity."   Describe how to prevent injury.   Develop an activity plan for the coming week.   Goals:   Record weight taken outside of class.   Track foods and beverages eaten each day in the "Food and Activity Tracker," including calories and fat grams for each item.    Track activity type, minutes you were active, and distance you reached each day in the "Food and Activity Tracker."   Set aside one 20 to 30-minute block of time every day or find two or more periods of 10 to15 minutes each for physical activity.   Warm up, cool down, and stretch.  Make a Physical Activities Plan for the Week.   Follow-Up Plan:  Attend Core Session 7 next week.   E-mail completed "Food and Activity Tracker" to Lifestyle Coach next week before class

## 2018-11-18 ENCOUNTER — Encounter (HOSPITAL_BASED_OUTPATIENT_CLINIC_OR_DEPARTMENT_OTHER): Payer: Medicare Other | Admitting: Registered"

## 2018-11-18 ENCOUNTER — Encounter: Payer: Self-pay | Admitting: Registered"

## 2018-11-18 DIAGNOSIS — R7303 Prediabetes: Secondary | ICD-10-CM | POA: Diagnosis not present

## 2018-11-18 NOTE — Progress Notes (Signed)
On 11/18/2018 patient completed Core Session 7 of Diabetes Prevention Program course virtually with [facility]. The following learning objectives were met by the patient during this class:   Learning Objectives:  Define calorie balance.  Explain how healthy eating and being active are related in terms of calorie balance.   Describe the relationship between calorie balance and weight loss.   Describe his or her progress as it relates to calorie balance.   Develop an activity plan for the coming week.   Goals:   Record weight taken outside of class.   Track foods and beverages eaten each day in the "Food and Activity Tracker," including calories and fat grams for each item.    Track activity type, minutes you were active, and distance you reached each day in the "Food and Activity Tracker."   Set aside one 20 to 30-minute block of time every day or find two or more periods of 10 to15 minutes each for physical activity.   Make a Physical Activities Plan for the Week.   Make active lifestyle choices all through the day   Stay at or go slightly over activity goal.   Follow-Up Plan:  Attend Core Session 8 next week.   E-mail completed "Food and Activity Tracker" to Lifestyle Coach next week before class

## 2018-11-25 ENCOUNTER — Encounter (HOSPITAL_BASED_OUTPATIENT_CLINIC_OR_DEPARTMENT_OTHER): Payer: Medicare Other | Admitting: Registered"

## 2018-11-25 DIAGNOSIS — R7303 Prediabetes: Secondary | ICD-10-CM | POA: Diagnosis not present

## 2018-11-28 ENCOUNTER — Encounter: Payer: Self-pay | Admitting: Registered"

## 2018-11-28 NOTE — Progress Notes (Signed)
On 11/25/2018 patient completed Core Session 8 of Diabetes Prevention Program course virtually with [facility]. The following learning objectives were met by the patient during this class:   Learning Objectives:  Recognize positive and negative food and activity cues.   Change negative food and activity cues to positive cues.   Add positive cues for activity and eliminate cues for inactivity.   Develop a plan for removing one problem food cue for the coming week.   Goals:   Record weight taken outside of class.   Track foods and beverages eaten each day in the "Food and Activity Tracker," including calories and fat grams for each item.    Track activity type, minutes you were active, and distance you reached each day in the "Food and Activity Tracker."   Set aside one 20 to 30-minute block of time every day or find two or more periods of 10 to15 minutes each for physical activity.   Remove one problem food cue.   Add one positive cue for being more active.  Follow-Up Plan: . Attend Core Session 9 next week.  . Email completed "Food and Activity Tracker" next week to be reviewed by Lifestyle Coach.

## 2018-12-02 ENCOUNTER — Encounter: Payer: Self-pay | Admitting: Registered"

## 2018-12-02 ENCOUNTER — Encounter: Payer: Medicare Other | Attending: Family Medicine | Admitting: Registered"

## 2018-12-02 DIAGNOSIS — R7303 Prediabetes: Secondary | ICD-10-CM | POA: Insufficient documentation

## 2018-12-02 NOTE — Progress Notes (Signed)
On 12/02/2018 patient completed Core Session 9 of Diabetes Prevention Program course virtually with [facility]. The following learning objectives were met by the patient during this class:   Learning Objectives:  List and describe five steps to problem solving.   Apply the five problem solving steps to resolve a problem he or she has with eating less fat and fewer calories or being more active.   Goals:   Record weight taken outside of class.   Track foods and beverages eaten each day in the "Food and Activity Tracker," including calories and fat grams for each item.    Track activity type, minutes you were active, and distance you reached each day in the "Food and Activity Tracker."   Set aside one 20 to 30-minute block of time every day or find two or more periods of 10 to15 minutes each for physical activity.   Use problem solving action plan created during session to problem solve.   Follow-Up Plan:  Attend Core Session 10 next week.   Email completed "Food and Activity Tracker" next week to be reviewed by Lifestyle Coach.  Email menus from favorite restaurants to next session for future discussion.

## 2018-12-09 ENCOUNTER — Ambulatory Visit (HOSPITAL_BASED_OUTPATIENT_CLINIC_OR_DEPARTMENT_OTHER): Payer: Medicare Other | Admitting: Registered"

## 2018-12-09 ENCOUNTER — Encounter: Payer: Self-pay | Admitting: Registered"

## 2018-12-09 DIAGNOSIS — R7303 Prediabetes: Secondary | ICD-10-CM

## 2018-12-09 NOTE — Progress Notes (Signed)
On 12/09/2018 patient completed Core Session 10 of Diabetes Prevention Program course virtually with [facility]. The following learning objectives were met by the patient during this class:   Learning Objectives:  List and describe the four keys for healthy eating out.   Give examples of how to apply these keys at the type of restaurants that the participants go to regularly.   Make an appropriate meal selection from a restaurant menu.   Demonstrate how to ask for a substitute item using assertive language and a polite tone of voice.    Goals:   Record weight taken outside of class.   Track foods and beverages eaten each day in the "Food and Activity Tracker," including calories and fat grams for each item.    Track activity type, minutes you were active, and distance you reached each day in the "Food and Activity Tracker."   Set aside one 20 to 30-minute block of time every day or find two or more periods of 10 to15 minutes each for physical activity.   Utilize positive action plan and complete questions on "To Do List."   Follow-Up Plan:  Attend Core Session 11.   Email completed "Food and Activity Tracker" to be reviewed by Lifestyle Coach.   

## 2018-12-15 DIAGNOSIS — L658 Other specified nonscarring hair loss: Secondary | ICD-10-CM | POA: Diagnosis not present

## 2018-12-15 DIAGNOSIS — L661 Lichen planopilaris: Secondary | ICD-10-CM | POA: Diagnosis not present

## 2018-12-15 DIAGNOSIS — L570 Actinic keratosis: Secondary | ICD-10-CM | POA: Diagnosis not present

## 2018-12-23 ENCOUNTER — Encounter: Payer: Medicare Other | Admitting: Registered"

## 2018-12-30 ENCOUNTER — Encounter: Payer: Self-pay | Admitting: Registered"

## 2018-12-30 ENCOUNTER — Ambulatory Visit (HOSPITAL_BASED_OUTPATIENT_CLINIC_OR_DEPARTMENT_OTHER): Payer: Medicare Other | Admitting: Registered"

## 2018-12-30 DIAGNOSIS — R7303 Prediabetes: Secondary | ICD-10-CM | POA: Diagnosis not present

## 2018-12-30 NOTE — Progress Notes (Signed)
On 12/30/2018 pt completed Core Session 12 of Diabetes Prevention Program course virtually with [facility]. By the end of this session patients are able to complete the following objectives:   Learning Objectives:  Describe their current progress toward defined goals.  Describe common causes for slipping from healthy eating or being  active.  Explain what to do to get back on their feet after a slip.  Goals:   Record weight taken outside of class.   Track foods and beverages eaten each day in the "Food and Activity Tracker," including calories and fat grams for each item.    Track activity type, minutes active, and distance reached each day in the "Food and Activity Tracker."   Try out the two action plans created during session- "Slips from Healthy Eating: Action Plan" and "Slips from Being Active: Action Plan"  Answer questions on the handout.   Follow-Up Plan:  Attend Core Session 13 next week.   Email completed "Food and Activity Tracker" next week to be reviewed by Lifestyle Coach.

## 2019-01-03 ENCOUNTER — Encounter: Payer: Medicare Other | Attending: Family Medicine | Admitting: Registered"

## 2019-01-03 ENCOUNTER — Other Ambulatory Visit: Payer: Self-pay

## 2019-01-03 DIAGNOSIS — R7303 Prediabetes: Secondary | ICD-10-CM

## 2019-01-05 ENCOUNTER — Encounter: Payer: Self-pay | Admitting: Registered"

## 2019-01-05 NOTE — Progress Notes (Signed)
On 01/03/2019 pt completed Core Session 11 of Diabetes Prevention Program course virtually with [facility]. By the end of this session patients are able to complete the following objectives:   Learning Objectives:  Give examples of negative thoughts that could prevent them from meeting their goals of losing weight and being more physically active.   Describe how to stop negative thoughts and talk back to them with positive thoughts.   Practice 1) stopping negative thoughts and 2) talking back to negative thoughts with positive ones.    Goals:   Record weight taken outside of class.   Track foods and beverages eaten each day in the "Food and Activity Tracker," including calories and fat grams for each item.    Track activity type, minutes you were active, and distance you reached each day in the "Food and Activity Tracker."   If you have any negative thoughts-write them in your Food and Activity Trackers, along with how you talked back to them. Practice stopping negative thoughts and talking back to them with positive thoughts.   Follow-Up Plan:  Attend Core Session 12 next week.   Email completed "Food and Activity Tracker" next week to be reviewed by Lifestyle Coach.

## 2019-01-06 ENCOUNTER — Encounter: Payer: Self-pay | Admitting: Registered"

## 2019-01-06 ENCOUNTER — Encounter (HOSPITAL_BASED_OUTPATIENT_CLINIC_OR_DEPARTMENT_OTHER): Payer: Medicare Other | Admitting: Registered"

## 2019-01-06 DIAGNOSIS — R7303 Prediabetes: Secondary | ICD-10-CM

## 2019-01-06 NOTE — Progress Notes (Signed)
On 01/06/2019 pt completed Core Session 13 of Diabetes Prevention Program course virtually with [facility]. By the end of this session patients are able to complete the following objectives:   Learning Objectives:  Describe ways to add interest and variety to their activity plans.  Define ?aerobic fitness.  Explain the four F.I.T.T. principles (frequency, intensity, time, and type of activity) and how they relate to aerobic fitness.   Goals:   Record weight taken outside of class.   Track foods and beverages eaten each day in the "Food and Activity Tracker," including calories and fat grams for each item.    Track activity type, minutes you were active, and distance you reached each day in the "Food and Activity Tracker."   Do your best to reach activity goal for the week.  Use one of the F.I.T.T. principles to jump start workouts.  Document activity level on the "To Do Next Week" handout.   Follow-Up Plan:  Attend Core Session 14 next week.   Email completed "Food and Activity Tracker" next week to be reviewed by Lifestyle Coach.

## 2019-01-13 ENCOUNTER — Encounter: Payer: Self-pay | Admitting: Registered"

## 2019-01-13 ENCOUNTER — Encounter (HOSPITAL_BASED_OUTPATIENT_CLINIC_OR_DEPARTMENT_OTHER): Payer: Medicare Other | Admitting: Registered"

## 2019-01-13 DIAGNOSIS — R7303 Prediabetes: Secondary | ICD-10-CM | POA: Diagnosis not present

## 2019-01-13 NOTE — Progress Notes (Signed)
On 01/13/2019 patient completed Core Session 14 of Diabetes Prevention Program course virtually with [facility]. By the end of this session patients are able to complete the following objectives:   Learning Objectives:  Give examples of problem social cues and helpful social cues.   Explain how to remove problem social cues and add helpful ones.   Describe ways of coping with vacations and social events such as parties, holidays, and visits from relatives and friends.   Create an action plan to change a problem social cue and add a helpful one.   Goals:   Record weight taken outside of class.   Track foods and beverages eaten each day in the "Food and Activity Tracker," including calories and fat grams for each item.    Track activity type, minutes you were active, and distance you reached each day in the "Food and Activity Tracker."   Do your best to reach activity goal for the week.  Use action plan created during session to change a problem social cue and add a helpful social cue.   Answer questions regarding success of changing social cues on "To Do Next Week" handout.   Follow-Up Plan:  Attend Core Session 15 next week.   Email completed "Food and Activity Tracker" next week to be reviewed by Lifestyle Coach.

## 2019-01-20 ENCOUNTER — Encounter: Payer: Self-pay | Admitting: Registered"

## 2019-01-20 ENCOUNTER — Encounter: Payer: Medicare Other | Attending: Family Medicine | Admitting: Registered"

## 2019-01-20 DIAGNOSIS — R7303 Prediabetes: Secondary | ICD-10-CM | POA: Diagnosis not present

## 2019-01-20 NOTE — Progress Notes (Signed)
On 01/20/2019 pt completed the Core Session 15 of Diabetes Prevention Program course virtually with [facility]. By the end of this session patients are able to complete the following objectives:   Learning Objectives:  Explain how to prevent stress or cope with unavoidable stress.   Describe how this program can be a source of stress.   Explain how to manage stressful situations.   Create and follow an action plan for either preventing or coping with a stressful situation.   Goals:   Record weight taken outside of class.   Track foods and beverages eaten each day in the "Food and Activity Tracker," including calories and fat grams for each item.    Track activity type, minutes you were active, and distance you reached each day in the "Food and Activity Tracker."   Do your best to reach activity goal for the week.  Follow your action plan to reduce stress.   Answer questions on handout regarding success of action plan.   Follow-Up Plan:  Attend Core Session 16.   Email completed "Food and Activity Tracker" next week to be reviewed by Lifestyle Coach.

## 2019-02-03 ENCOUNTER — Encounter: Payer: Medicare Other | Attending: Family Medicine | Admitting: Registered"

## 2019-02-03 DIAGNOSIS — R7303 Prediabetes: Secondary | ICD-10-CM | POA: Insufficient documentation

## 2019-02-10 ENCOUNTER — Encounter (HOSPITAL_BASED_OUTPATIENT_CLINIC_OR_DEPARTMENT_OTHER): Payer: Medicare Other | Admitting: Registered"

## 2019-02-10 DIAGNOSIS — R7303 Prediabetes: Secondary | ICD-10-CM

## 2019-02-11 ENCOUNTER — Encounter: Payer: Self-pay | Admitting: Registered"

## 2019-02-11 NOTE — Progress Notes (Signed)
On 02/10/2019 patient completed a post core session of the Diabetes Prevention Program course virtually with [facility]. By the end of this session patients are able to complete the following objectives:   Learning Objectives:  Describe the importance of having regular meals each day and how skipping meals can negatively affect food choices and weight.   Plan out balanced meals and snacks.  List ways to avoid unplanned snacking.   Goals:   Record weight taken outside of class.   Track foods and beverages eaten each day in the "Food and Activity Tracker," including calories and fat grams for each item.    Track activity type, minutes you were active, and distance you reached each day in the "Food and Activity Tracker."   Follow-Up Plan:  Attend next session.   Email completed "Food and Activity Trackers" before next session to be reviewed by Lifestyle Coach.

## 2019-02-13 ENCOUNTER — Other Ambulatory Visit: Payer: Self-pay

## 2019-02-13 ENCOUNTER — Other Ambulatory Visit: Payer: Self-pay | Admitting: Family Medicine

## 2019-02-13 ENCOUNTER — Other Ambulatory Visit: Payer: Medicare Other

## 2019-02-13 DIAGNOSIS — E785 Hyperlipidemia, unspecified: Secondary | ICD-10-CM

## 2019-02-13 DIAGNOSIS — I10 Essential (primary) hypertension: Secondary | ICD-10-CM

## 2019-02-13 DIAGNOSIS — M159 Polyosteoarthritis, unspecified: Secondary | ICD-10-CM

## 2019-02-13 DIAGNOSIS — R7303 Prediabetes: Secondary | ICD-10-CM

## 2019-02-14 ENCOUNTER — Other Ambulatory Visit: Payer: Self-pay | Admitting: Family Medicine

## 2019-02-14 DIAGNOSIS — E785 Hyperlipidemia, unspecified: Secondary | ICD-10-CM

## 2019-02-14 LAB — LIPID PANEL
Chol/HDL Ratio: 4 ratio (ref 0.0–4.4)
Cholesterol, Total: 212 mg/dL — ABNORMAL HIGH (ref 100–199)
HDL: 53 mg/dL (ref >39)
LDL Chol Calc (NIH): 137 mg/dL — ABNORMAL HIGH (ref 0–99)
Triglycerides: 125 mg/dL (ref 0–149)
VLDL Cholesterol Cal: 22 mg/dL (ref 5–40)

## 2019-02-14 LAB — CALCIUM: Calcium: 10.1 mg/dL (ref 8.7–10.3)

## 2019-02-14 LAB — HEMOGLOBIN A1C
Est. average glucose Bld gHb Est-mCnc: 123 mg/dL
Hgb A1c MFr Bld: 5.9 % — ABNORMAL HIGH (ref 4.8–5.6)

## 2019-02-15 ENCOUNTER — Ambulatory Visit (INDEPENDENT_AMBULATORY_CARE_PROVIDER_SITE_OTHER): Payer: Medicare Other | Admitting: Family Medicine

## 2019-02-15 ENCOUNTER — Other Ambulatory Visit: Payer: Self-pay

## 2019-02-15 ENCOUNTER — Encounter: Payer: Self-pay | Admitting: Family Medicine

## 2019-02-15 VITALS — BP 137/84 | HR 65 | Temp 96.7°F | Ht 66.5 in | Wt 193.3 lb

## 2019-02-15 DIAGNOSIS — Z789 Other specified health status: Secondary | ICD-10-CM

## 2019-02-15 DIAGNOSIS — R351 Nocturia: Secondary | ICD-10-CM | POA: Diagnosis not present

## 2019-02-15 DIAGNOSIS — I10 Essential (primary) hypertension: Secondary | ICD-10-CM | POA: Diagnosis not present

## 2019-02-15 DIAGNOSIS — Z7189 Other specified counseling: Secondary | ICD-10-CM

## 2019-02-15 DIAGNOSIS — Z5320 Procedure and treatment not carried out because of patient's decision for unspecified reasons: Secondary | ICD-10-CM

## 2019-02-15 DIAGNOSIS — E669 Obesity, unspecified: Secondary | ICD-10-CM

## 2019-02-15 DIAGNOSIS — E559 Vitamin D deficiency, unspecified: Secondary | ICD-10-CM

## 2019-02-15 DIAGNOSIS — E785 Hyperlipidemia, unspecified: Secondary | ICD-10-CM

## 2019-02-15 DIAGNOSIS — R7303 Prediabetes: Secondary | ICD-10-CM

## 2019-02-15 MED ORDER — OLMESARTAN-AMLODIPINE-HCTZ 20-5-12.5 MG PO TABS: 1.0000 | ORAL_TABLET | Freq: Every day | ORAL | 1 refills | Status: DC

## 2019-02-15 MED ORDER — HYDROCHLOROTHIAZIDE 12.5 MG PO TABS: 12.5000 mg | ORAL_TABLET | Freq: Every day | ORAL | 1 refills | Status: DC

## 2019-02-15 MED ORDER — EZETIMIBE 10 MG PO TABS: 10.0000 mg | ORAL_TABLET | Freq: Every day | ORAL | 0 refills | Status: DC

## 2019-02-15 MED ORDER — VITAMIN D (ERGOCALCIFEROL) 1.25 MG (50000 UNIT) PO CAPS: 50000.0000 [IU] | ORAL_CAPSULE | ORAL | 10 refills | Status: DC

## 2019-02-15 MED ORDER — ROSUVASTATIN CALCIUM 5 MG PO TABS: ORAL_TABLET | ORAL | 0 refills | Status: DC

## 2019-02-15 NOTE — Assessment & Plan Note (Signed)
The 10-year ASCVD risk score Mikey Bussing DC Brooke Bonito., et al., 2013) is: 12%   Values used to calculate the score:     Age: 67 years     Sex: Female     Is Non-Hispanic African American: No     Diabetic: No     Tobacco smoker: No     Systolic Blood Pressure: 0000000 mmHg     Is BP treated: Yes     HDL Cholesterol: 53 mg/dL     Total Cholesterol: 212 mg/dL

## 2019-02-15 NOTE — Progress Notes (Signed)
Virtual / live video office visit note for [employer], D.O- Primary Care Physician at [facility]   I connected with current patient today and beyond visually recognizing the correct individual, I verified that I am speaking with the correct person using two identifiers.  . Location of the patient: Home . Location of the provider: Office Only the patient (+/- their family members at pt's discretion) and myself were participating in the encounter    - This visit type was conducted due to national recommendations for restrictions regarding the COVID-19 Pandemic (e.g. social distancing) in an effort to limit this patient's exposure and mitigate transmission in our community.  This format is felt to be most appropriate for this patient at this time.   - The patient did have access to video technology today  - No physical exam could be performed with this format, beyond that communicated to [place] by the patient/ family members as noted.   - Additionally my office staff/ schedulers discussed with the patient that there may be a monetary charge related to this service, depending on patient's medical insurance.   The patient expressed understanding, and agreed to proceed.      History of Present Illness: Hypertension   I, Toni Amend, am serving as scribe for Dr. Mellody Dance.  - Nutritionist Follow-Up Confirms that she has been going to nutritionist for consultation.  Notes "it's been awesome, and I know it's taken me a long time to get all this in my body, and it's going to take me a while to understand the process, understand what I'm putting into my body, and what I can do to help my cholesterol levels."  Says "I've got until August to finish this course."  Her insurance has paid for this.  She has been meeting with nutritionist every week, every Friday.  In January this will stretch out to twice a month, and then in March, her visits will stretch out to once per month.  Notes  "it's really been a great experience for me."  Says she knows she has a lot to work on and hasn't dropped a lot of weight, but "this, to me is like learning about my body and what I can do to live healthier.  I just appreciate it from the bottom of my heart that you recommended that I go there, because I do want to get off of my cholesterol medicine and eventually try to get off of my blood pressure medication."  - Urination at Night She has been urinating three times nightly for many years and recently went to see urology for this complaint.  Notes "I walked away with everything looking great with my bladder and kidneys, but I still get up and sometimes can hardly make it to the bathroom."   States she drinks a lot of water, and thinks when she takes her BP meds in the morning with the HCTZ, she feels the urge to urinate within a few hours.  "I reckon when I'm laying down at night in all the water I drank during the day, it's sort of like all that water's coming out."  Confirms she wakes up 2-3 times per night to go to the bathroom.  She wonders if she could change her blood pressure medicine to help alleviate this.  Urologist sent a copy of recent visit to clinic.  Says "he didn't see any issues; he gave me a great checkup.  He said there's no need  for any [medications], and he seems to think there may be an issue with the HCTZ."  Says she was told that other issues could come up with recommended prescriptions, and she wanted to wait on that.  - Prediabetes Last A1C in the office was:  Lab Results  Component Value Date   HGBA1C 5.9 (H) 02/13/2019   HGBA1C 5.8 (H) 08/02/2018   HGBA1C 5.4 12/17/2016   Lab Results  Component Value Date   LDLCALC 137 (H) 02/13/2019   CREATININE 0.85 08/02/2018   BP Readings from Last 3 Encounters:  02/15/19 137/84  08/17/18 125/88  07/27/18 108/85   Wt Readings from Last 3 Encounters:  02/15/19 193 lb 4.8 oz (87.7 kg)  08/17/18 200 lb 14.4 oz (91.1 kg)   07/27/18 199 lb 4.8 oz (90.4 kg)   HPI:  Hyperlipidemia:  68 y.o. female here for cholesterol follow-up.   - Patient reports good compliance with treatment plan of:  medication and/ or lifestyle management.  She has been taking Zetia and trying to adjust her eating habits per recommendations of nutrition.  Notes she had bad leg cramps with Crestor and Lipitor in the past.    - Patient denies any acute concerns or problems with management plan   - She denies new onset of: myalgias, arthralgias, increased fatigue more than normal, chest pains, exercise intolerance, shortness of breath, dizziness, visual changes, headache, lower extremity swelling or claudication.   Most recent cholesterol panel was:  Lab Results  Component Value Date   CHOL 212 (H) 02/13/2019   HDL 53 02/13/2019   LDLCALC 137 (H) 02/13/2019   LDLDIRECT 177.1 09/20/2012   TRIG 125 02/13/2019   CHOLHDL 4.0 02/13/2019   Hepatic Function Latest Ref Rng & Units 08/02/2018 12/17/2016 12/04/2015  Total Protein 6.0 - 8.5 g/dL 7.3 7.1 -  Albumin 3.8 - 4.8 g/dL 4.4 4.3 -  AST 0 - 40 IU/L _0 ALT 0 - 32 IU/L _1 Alk Phosphatase 39 - 117 IU/L 85 69 85  Total Bilirubin 0.0 - 1.2 mg/dL 0.5 0.6 -  Bilirubin, Direct 0.0 - 0.3 mg/dL - - -   HPI:  Hypertension:  -  Her blood pressure at home since October has been running:  114/72, HR 68 138/81, HR 62 147/80, HR 64 136/82, HR 67 140/84, HR  53  - Patient reports good compliance with medication and/or lifestyle modification  - Her denies acute concerns or problems related to treatment plan, aside from her nightly urination.  Notes she's been on Benicar since before 2008 and "I wonder if my body is not working with that medication anymore."   - She denies new onset of: chest pain, exercise intolerance, shortness of breath, dizziness, visual changes, headache, lower extremity swelling or claudication.   Last 3 blood pressure readings in our office are as  follows: BP Readings from Last 3 Encounters:  02/15/19 137/84  08/17/18 125/88  07/27/18 108/85   Filed Weights   02/15/19 1332  Weight: 193 lb 4.8 oz (87.7 kg)      Depression screen [facility] 2/9 02/15/2019 08/17/2018 07/27/2018 01/24/2018 07/14/2017  Decreased Interest 0 0 0 0 0  Down, Depressed, Hopeless 0 0 0 0 0  PHQ - 2 Score 0 0 0 0 0  Altered sleeping 0 0 0 0 0  Tired, decreased energy 0 0 0 0 0  Change in appetite 0 0 0 0 0  Feeling bad or failure about yourself  0  0 0 0 0  Trouble concentrating 0 0 0 0 0  Moving slowly or fidgety/restless 0 0 0 0 0  Suicidal thoughts 0 0 0 0 0  PHQ-9 Score 0 0 0 0 0  Difficult doing work/chores - Not difficult at all Not difficult at all Not difficult at all Not difficult at all    GAD 7 : Generalized Anxiety Score 08/17/2018 07/27/2018  Nervous, Anxious, on Edge 0 0  Control/stop worrying 0 0  Worry too much - different things 0 0  Trouble relaxing 0 0  Restless 0 0  Easily annoyed or irritable 0 0  Afraid - awful might happen 0 0  Total GAD 7 Score 0 0  Anxiety Difficulty Not difficult at all Not difficult at all     Impression and Recommendations:    1. Essential hypertension   2. Hyperlipidemia, unspecified hyperlipidemia type- poorly controlled   3. Statin declined   4. Nocturia more than twice per night   5. Statin intolerance-   muscle cramps   6. Prediabetes   7. Hyperlipidemia, unspecified hyperlipidemia type   8. Vitamin D deficiency     Nocturia more than twice per night - Discussed reported symptoms with patient today. - Extensively reviewed results/ OV notes of pt's recent urology appointment.  - Reviewed that urologist recommended potentially increasing HCTZ dosage to help increase diuresis during the day.  Education provided and all pt's questions answered.  - Begin HCTZ 12.5 daily around lunch time, IN ADDITION TO HER olmesartan-amlodipine-HCTZ and see if it helps with complaints.  - If this change does not  aid with patient's urinary symptoms at night, recommended that patient follow up with urology for further assessment.  - With change in HCTZ dosage, patient knows to keep a closer eye on her BP daily and monitor along with heart rate.  Patient knows to watch for symptoms of dizziness or lightheadedness- indicating bp can be too low.  - Reviewed importance of ongoing adequate hydration. - Advised patient do NOT drink liquids after 6 PM- to prevent nocturnal urination.  - Encouraged patient to follow up with urology regarding all urological concerns. - Patient understands need to direct all questions to specialist for further evaluation.  - Extensive discussion had regarding txmnt, effects of txmnt, change in meds, etc. Pt's questions answered   Essential Hypertension - Blood pressure currently is at goal in 130's/80's. Reviewed goals with pt/  - Patient will continue current treatment regimen.  See med list.  - Counseled patient on pathophysiology of disease and discussed various treatment options, which always includes dietary and lifestyle modification as first line.   Pt asked several questions about the difference between all her BP meds and MOA of each one etc.  She expressed desire to know exactly what she is putting in her body.  Extensive discussion had with pt.   - Lifestyle changes such as dash and heart healthy. med diets and engaging in a regular exercise program discussed extensively with patient.   - Ambulatory blood pressure monitoring encouraged at least 3 times weekly.  Keep log and have for every visit.   - Reminded patient that if they ever feel poorly in any way, to check their blood pressure and pulse.  - Handouts provided at patient's desire and/or told to go online at the [place] website for further information  - We will continue to monitor   Hyperlipidemia - Statin Intolerance, Muscle Cramps - Last FLP obtained two days  ago. - Triglycerides  = 125, up from 114 six months prior. - LDL = 137, up from 131 six months ago, and 115 one year prior. - HDL = 53, up from 49 six months prior.  The 10-year ASCVD risk score Mikey Bussing DC Brooke Bonito., et al., 2013) is: 9%   Values used to calculate the score:     Age: 23 years     Sex: Female     Is Non-Hispanic African American: No     Diabetic: No     Tobacco smoker: No     Systolic Blood Pressure: 539 mmHg     Is BP treated: No     HDL Cholesterol: 53 mg/dL     Total Cholesterol: 212 mg/dL  - Patient was referred to nutrition for dietary education last OV.  Patient has been following up with nutrition as scheduled, however cholesterol levels have slightly worsened.  - Was managed on Lipitor and Crestor in the past- not sure of dose given, and notes "those are the only two that gave me the leg cramps."  - Recommended beginning statin today based on ASCVD risk  - Patient agrees to try low-dose statin.  See med list. - Patient will take half of a tablet every other night before bed.  - If patient cannot tolerate statin management, discussed need for referral to cardiology lipid clinic.  - Otherwise continue Zetia and ongoing nutritional changes as established.  - Dietary changes such as low saturated & trans fat diets for hyperlipidemia and low carb diets for hypertriglyceridemia discussed with patient.    - Encouraged patient to follow AHA guidelines for regular exercise and also engage in weight loss if BMI above 25.   - Educational handouts provided at patient's desire and/ or told to look online at the [place] website for further information.  - We will continue to monitor.   Prediabetes - HbA1c two days ago measured at 5.9, up from 5.8 prior.  - Counseled patient on prevention of diabetes and discussed dietary and lifestyle modification as first line.    - Importance of low carb, heart-healthy diet discussed with patient in addition to regular aerobic exercise of  53mn 5d/week or more.   - We will continue to monitor   REcommendations - Re-check FLP and CMP in 6-8 weeks after starting VERY LOW DOSE statin - Will re-check electrolytes after increasing HCTZ- obtain CMP.   Patient was interviewed and evaluated by me today for 40+ minutes, with over 50% of my time spent in face to face counseling of patients various medical conditions, treatment plans of those medical conditions including medicine management and lifestyle modification, strategies to improve health and well being; and in coordination of care.    - As part of my medical decision making, I reviewed the following data within the [facility] obtained from pt /family, CMA notes reviewed and incorporated if applicable, Labs reviewed, Radiograph/ tests reviewed if applicable and OV notes from prior OV's with me, as well as other specialists she/he has seen since seeing me last, were all reviewed and used in my medical decision making process today.   - Additionally, discussion had with patient regarding txmnt plan, their biases about that plan etc were used in my medical decision making today.   - The patient agreed with the plan and demonstrated an understanding of the instructions.   No barriers to understanding were identified.     Return for [place]-check [place] 6-8  weeks, then DOXY 3-4 days later.    Orders Placed This Encounter  Procedures  . Lipid panel  . Comprehensive metabolic panel    Meds ordered this encounter  Medications  . hydrochlorothiazide (HYDRODIURIL) 12.5 MG tablet    Sig: Take 1 tablet (12.5 mg total) by mouth daily.    Dispense:  30 tablet    Refill:  1  . rosuvastatin (CRESTOR) 5 MG tablet    Sig: 0.5 tab every other night before bedtime    Dispense:  23 tablet    Refill:  0  . ezetimibe (ZETIA) 10 MG tablet    Sig: Take 1 tablet (10 mg total) by mouth at bedtime.    Dispense:  90 tablet    Refill:  0  . Olmesartan-amLODIPine-HCTZ  20-5-12.5 MG TABS    Sig: Take 1 tablet by mouth daily.    Dispense:  90 tablet    Refill:  1  . DISCONTD: Vitamin D, Ergocalciferol, (DRISDOL) 1.25 MG (50000 UT) CAPS capsule    Sig: Take 1 capsule (50,000 Units total) by mouth every 7 (seven) days.    Dispense:  12 capsule    Refill:  10    Medications Discontinued During This Encounter  Medication Reason  . Vitamin D, Ergocalciferol, (DRISDOL) 1.25 MG (50000 UT) CAPS capsule Reorder  . Olmesartan-amLODIPine-HCTZ 20-5-12.5 MG TABS Reorder  . ezetimibe (ZETIA) 10 MG tablet Reorder     Note:  This note was prepared with assistance of Dragon voice recognition software. Occasional wrong-word or sound-a-like substitutions may have occurred due to the inherent limitations of voice recognition software.  This document serves as a record of services personally performed by Mellody Dance, DO. It was created on her behalf by Toni Amend, a trained medical scribe. The creation of this record is based on the scribe's personal observations and the provider's statements to them.   This case required medical decision making of at least moderate complexity. The above documentation has been reviewed to be accurate and was completed by Marjory Sneddon, D.O.    Patient Care Team    Relationship Specialty Notifications Start End  Mellody Dance, DO PCP - General Family Medicine  12/05/15   Marchia Bond, MD  Orthopedic Surgery  04/07/12   Ladene Artist, MD  Gastroenterology  07/27/12   [facility], [facility]    02/15/19    Comment: MAcDermoid- urology    -Vitals obtained; medications/ allergies reconciled;  personal medical, social, Sx etc.histories were updated by CMA, reviewed by me and are reflected in chart  Patient Active Problem List   Diagnosis Date Noted  . Obesity (BMI 30-39.9) 06/22/2013  . Dyslipidemia 09/27/2008  . Hyperlipidemia 09/27/2008  . Essential hypertension 06/28/2008  . Hematuria 03/13/2016  .  Chronic Fatigue 07/27/2014  . Chronic constipation- sees GI 07/27/2014  . Intolerance of drug-  will not take statin 07/14/2017  . Acute reaction to situational stress 08/17/2016  . Hx of cardiovascular stress test 12/22/2015  . Vitamin D deficiency 12/22/2015  . Generalized OA 10/29/2014  . Asymptomatic postmenopausal status 01/04/2009  . Statin declined 02/15/2019  . Nocturia more than twice per night 02/15/2019  . Statin intolerance-   muscle cramps 02/15/2019  . Prediabetes 08/17/2018  . Incontinence in female 07/27/2018  . Nocturia-chronic 07/27/2018  . Actinic keratoses 09/30/2017  . 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 Inactivity 07/14/2017  . Dysuria 06/23/2017  . Abnormal urinalysis 06/23/2017  . Chronic  pain of right knee 12/21/2016  . Abnormality of heart beat-  08/17/2016  . GAD (generalized anxiety disorder) 08/17/2016  . Pelvic pressure in female 03/13/2016  . Osteoarthritis of knee, unspecified 10/29/2014  . Rectus diastasis 08/09/2013  . Obesity 06/22/2013  . Gallstone 07/27/2012  . Acute diverticulitis 07/27/2012  . Partial tear of subscapularis tendon 04/22/2012  . Supraspinatus tendon tear 04/22/2012  . Trochanteric bursitis of right hip      Current Meds  Medication Sig  . aspirin EC 81 MG tablet Take 1 tablet (81 mg total) by mouth daily.  Marland Kitchen ezetimibe (ZETIA) 10 MG tablet Take 1 tablet (10 mg total) by mouth at bedtime.  . Olmesartan-amLODIPine-HCTZ 20-5-12.5 MG TABS Take 1 tablet by mouth daily.  . [DISCONTINUED] ezetimibe (ZETIA) 10 MG tablet TAKE 1 TABLET (10 MG TOTAL) BY MOUTH AT BEDTIME. (NEEDS OV 4 RF)  . [DISCONTINUED] Olmesartan-amLODIPine-HCTZ 20-5-12.5 MG TABS Take 1 tablet by mouth daily.  . [DISCONTINUED] Vitamin D, Ergocalciferol, (DRISDOL) 1.25 MG (50000 UT) CAPS capsule Take 1 capsule (50,000 Units total) by mouth every 7 (seven) days.  . [DISCONTINUED] Vitamin D, Ergocalciferol, (DRISDOL) 1.25 MG (50000 UT)  CAPS capsule Take 1 capsule (50,000 Units total) by mouth every 7 (seven) days.     Allergies  Allergen Reactions  . Codeine Nausea Only  . Statins      ROS:  See above HPI for pertinent positives and negatives   Objective:   Blood pressure 137/84, pulse 65, temperature (!) 96.7 F (35.9 C), height 5' 6.5" (1.689 m), weight 193 lb 4.8 oz (87.7 kg).  (if some vitals are omitted, this means that patient was UNABLE to obtain them even though they were asked to get them prior to OV today.  They were asked to call us at their earliest convenience with these once obtained.)  General: A & O * 3; visually in no acute distress; in usual state of health.  Skin: Visible skin appears normal and pt's usual skin color HEENT:  EOMI, head is normocephalic and atraumatic.  Sclera are anicteric. Neck has a good range of motion.  Lips are noncyanotic Chest: normal chest excursion and movement Respiratory: speaking in full sentences, no conversational dyspnea; no use of accessory muscles Psych: insight good, mood- appears full

## 2019-02-16 ENCOUNTER — Other Ambulatory Visit: Payer: Self-pay | Admitting: Family Medicine

## 2019-02-16 DIAGNOSIS — E559 Vitamin D deficiency, unspecified: Secondary | ICD-10-CM

## 2019-02-17 ENCOUNTER — Encounter: Payer: Self-pay | Admitting: Registered"

## 2019-02-17 ENCOUNTER — Encounter: Payer: Medicare Other | Admitting: Registered"

## 2019-02-17 DIAGNOSIS — R7303 Prediabetes: Secondary | ICD-10-CM

## 2019-02-17 NOTE — Progress Notes (Signed)
On 02/17/2019 patient attended a virtual grocery store tour session as part of the Diabetes Prevention Program with [facility].   Learning Objectives:  Develop a plan for our grocery shopping experience  Putting together a list  How to navigate the grocery store  Identify 4 main sections of the grocery store  Produce  Meat/Poultry/Fish  [place]  Consider tips for shopping in each of these four sections  Reflect on our own shopping habits  Create a new goal for our next grocery shopping experience  Engage in a group discussion  Goals:   Record weight taken outside of class.   Track foods and beverages eaten each day in the "Food and Activity Tracker," including calories and fat grams for each item.   Create one new goal for the next grocery shopping experience based on the information provided today  Follow-Up Plan:  Attend next session.   Email completed "Food and Activity Tracker" before next session to be reviewed by Lifestyle Coach.

## 2019-02-19 NOTE — Patient Instructions (Signed)
Risk factors for prediabetes and type 2 diabetes  Researchers don't fully understand why some people develop prediabetes and type 2 diabetes and others don't.  It's clear that certain factors increase the risk, however, including:  Weight. The more fatty tissue you have, the more resistant your cells become to insulin.  Inactivity. The less active you are, the greater your risk. Physical activity helps you control your weight, uses up glucose as energy and makes your cells more sensitive to insulin.  Family history. Your risk increases if a parent or sibling has type 2 diabetes.  Race. Although it's unclear why, people of certain races -- including blacks, Hispanics, American Indians and Asian-Americans -- are at higher risk.  Age. Your risk increases as you get older. This may be because you tend to exercise less, lose muscle mass and gain weight as you age. But type 2 diabetes is also increasing dramatically among children, adolescents and younger adults.  Gestational diabetes. If you developed gestational diabetes when you were pregnant, your risk of developing prediabetes and type 2 diabetes later increases. If you gave birth to a baby weighing more than 9 pounds (4 kilograms), you're also at risk of type 2 diabetes.  Polycystic ovary syndrome. For women, having polycystic ovary syndrome -- a common condition characterized by irregular menstrual periods, excess hair growth and obesity -- increases the risk of diabetes.  High blood pressure. Having blood pressure over 140/90 millimeters of mercury (mm Hg) is linked to an increased risk of type 2 diabetes.  Abnormal cholesterol and triglyceride levels. If you have low levels of high-density lipoprotein (HDL), or "good," cholesterol, your risk of type 2 diabetes is higher. Triglycerides are another type of fat carried in the blood. People with high levels of triglycerides have an increased risk of type 2 diabetes. Your doctor can let you know what your  cholesterol and triglyceride levels are.  A good guide to good carbs: The glycemic index ---If you have diabetes, or at risk for diabetes, you know all too well that when you eat carbohydrates, your blood sugar goes up. The total amount of carbs you consume at a meal or in a snack mostly determines what your blood sugar will do. But the food itself also plays a role. A serving of white rice has almost the same effect as eating pure table sugar -- a quick, high spike in blood sugar. A serving of lentils has a slower, smaller effect.  ---Picking good sources of carbs can help you control your blood sugar and your weight. Even if you don't have diabetes, eating healthier carbohydrate-rich foods can help ward off a host of chronic conditions, from heart disease to various cancers to, well, diabetes.  ---One way to choose foods is with the glycemic index (GI). This tool measures how much a food boosts blood sugar.  The glycemic index rates the effect of a specific amount of a food on blood sugar compared with the same amount of pure glucose. A food with a glycemic index of 28 boosts blood sugar only 28% as much as pure glucose. One with a GI of 95 acts like pure glucose.    High glycemic foods result in a quick spike in insulin and blood sugar (also known as blood glucose).  Low glycemic foods have a slower, smaller effect- these are healthier for you.   Using the glycemic index Using the glycemic index is easy: choose foods in the low GI category instead of those in the high  GI category (see below), and go easy on those in between. Low glycemic index (GI of 55 or less): Most fruits and vegetables, beans, minimally processed grains, pasta, low-fat dairy foods, and nuts.  Moderate glycemic index (GI 56 to 69): White and sweet potatoes, corn, white rice, couscous, breakfast cereals such as Cream of Wheat and Mini Wheats.  High glycemic index (GI of 70 or higher): White bread, rice cakes, most crackers,  bagels, cakes, doughnuts, croissants, most packaged breakfast cereals. You can see the values for 100 commons foods and get links to more at www.[email or web address].si.  Swaps for lowering glycemic index  Instead of this high-glycemic index food Eat this lower-glycemic index food  White rice Brown rice or converted rice  Instant oatmeal Steel-cut oats  Cornflakes Bran flakes  Baked potato Pasta, bulgur  White bread Whole-grain bread  Corn Peas or leafy greens       Prediabetes Eating Plan  Prediabetes--also called impaired glucose tolerance or impaired fasting glucose--is a condition that causes blood sugar (blood glucose) levels to be higher than normal. Following a healthy diet can help to keep prediabetes under control. It can also help to lower the risk of type 2 diabetes and heart disease, which are increased in people who have prediabetes. Along with regular exercise, a healthy diet:  Promotes weight loss.  Helps to control blood sugar levels.  Helps to improve the way that the body uses insulin.   WHAT DO I NEED TO KNOW ABOUT THIS EATING PLAN?   Use the glycemic index (GI) to plan your meals. The index tells you how quickly a food will raise your blood sugar. Choose low-GI foods. These foods take a longer time to raise blood sugar.  Pay close attention to the amount of carbohydrates in the food that you eat. Carbohydrates increase blood sugar levels.  Keep track of how many calories you take in. Eating the right amount of calories will help you to achieve a healthy weight. Losing about 7 percent of your starting weight can help to prevent type 2 diabetes.  You may want to follow a Mediterranean diet. This diet includes a lot of vegetables, lean meats or fish, whole grains, fruits, and healthy oils and fats.   WHAT FOODS CAN I EAT?  Grains Whole grains, such as whole-wheat or whole-grain breads, crackers, cereals, and pasta. Unsweetened oatmeal. Bulgur. Barley.  Quinoa. Brown rice. Corn or whole-wheat flour tortillas or taco shells. Vegetables Lettuce. Spinach. Peas. Beets. Cauliflower. Cabbage. Broccoli. Carrots. Tomatoes. Squash. Eggplant. Herbs. Peppers. Onions. Cucumbers. Brussels sprouts. Fruits Berries. Bananas. Apples. Oranges. Grapes. Papaya. Mango. Pomegranate. Kiwi. Grapefruit. Cherries. Meats and Other Protein Sources Seafood. Lean meats, such as chicken and [place] or lean cuts of pork and beef. Tofu. Eggs. Nuts. Beans. Dairy Low-fat or fat-free dairy products, such as yogurt, cottage cheese, and cheese. Beverages Water. Tea. Coffee. Sugar-free or diet soda. Seltzer water. Milk. Milk alternatives, such as soy or almond milk. Condiments Mustard. Relish. Low-fat, low-sugar ketchup. Low-fat, low-sugar barbecue sauce. Low-fat or fat-free mayonnaise. Sweets and Desserts Sugar-free or low-fat pudding. Sugar-free or low-fat ice cream and other frozen treats. Fats and Oils Avocado. Walnuts. Olive oil. The items listed above may not be a complete list of recommended foods or beverages. Contact your dietitian for more options.    WHAT FOODS ARE NOT RECOMMENDED?  Grains Refined white flour and flour products, such as bread, pasta, snack foods, and cereals. Beverages Sweetened drinks, such as sweet iced tea and soda. Sweets and Desserts  Baked goods, such as cake, cupcakes, pastries, cookies, and cheesecake. The items listed above may not be a complete list of foods and beverages to avoid. Contact your dietitian for more information.   This information is not intended to replace advice given to you by your health care provider. Make sure you discuss any questions you have with your health care provider.   Document Released: 07/03/2014 Document Reviewed: 07/03/2014 Elsevier Interactive Patient Education 2016 [place] for a Low Cholesterol, Low Saturated Fat Diet   Fats - Limit total intake of fats and oils. -  Avoid butter, stick margarine, shortening, lard, palm and coconut oils. - Limit mayonnaise, salad dressings, gravies and sauces, unless they are homemade with low-fat ingredients. - Limit chocolate. - Choose low-fat and nonfat products, such as low-fat mayonnaise, low-fat or non-hydrogenated peanut butter, low-fat or fat-free salad dressings and nonfat gravy. - Use vegetable oil, such as canola or olive oil. - Look for margarine that does not contain trans fatty acids. - Use nuts in moderate amounts. - Read ingredient labels carefully to determine both amount and type of fat present in foods. Limit saturated and trans fats! - Avoid high-fat processed and convenience foods.  Meats and Meat Alternatives - Choose fish, chicken, [place] and lean meats. - Use dried beans, peas, lentils and tofu. - Limit egg yolks to three to four per week. - If you eat red meat, limit to no more than three servings per week and choose loin or round cuts. - Avoid fatty meats, such as bacon, sausage, franks, luncheon meats and ribs. - Avoid all organ meats, including liver.  Dairy - Choose nonfat or low-fat milk, yogurt and cottage cheese. - Most cheeses are high in fat. Choose cheeses made from non-fat milk, such as mozzarella and ricotta cheese. - Choose light or fat-free cream cheese and sour cream. - Avoid cream and sauces made with cream.  Fruits and Vegetables - Eat a wide variety of fruits and vegetables. - Use lemon juice, vinegar or "mist" olive oil on vegetables. - Avoid adding sauces, fat or oil to vegetables.  Breads, Cereals and Grains - Choose whole-grain breads, cereals, pastas and rice. - Avoid high-fat snack foods, such as granola, cookies, pies, pastries, doughnuts and croissants.  Cooking Tips - Avoid deep fried foods. - Trim visible fat off meats and remove skin from poultry before cooking. - Bake, broil, boil, poach or roast poultry, fish and lean meats. - Drain and discard fat that  drains out of meat as you cook it. - Add little or no fat to foods. - Use vegetable oil sprays to grease pans for cooking or baking. - Steam vegetables. - Use herbs or no-oil marinades to flavor foods.     Nine ways to increase your "good" HDL cholesterol  High-density lipoprotein (HDL) is often referred to as the "good" cholesterol. Having high HDL levels helps carry cholesterol from your arteries to your liver, where it can be used or excreted.  Having high levels of HDL also has antioxidant and anti-inflammatory effects, and is linked to a reduced risk of heart disease (1, 2).  Most health experts recommend minimum blood levels of 40 mg/dl in men and 50 mg/dl in women.  While genetics definitely play a role, there are several other factors that affect HDL levels.  Here are nine healthy ways to raise your "good" HDL cholesterol.  1. Consume olive oil  two pieces of salmon on a plate  olive oil being poured into a small dish Extra virgin olive oil may be more healthful than processed olive oils. Olive oil is one of the healthiest fats around.  A large analysis of 42 studies with more than 800,000 participants found that olive oil was the only source of monounsaturated fat that seemed to reduce heart disease risk (3).  Research has shown that one of olive oil's heart-healthy effects is an increase in HDL cholesterol. This effect is thought to be caused by antioxidants it contains called polyphenols (4, 5, 6, 7).  Extra virgin olive oil has more polyphenols than more processed olive oils, although the amount can still vary among different types and brands.  One study gave 200 healthy young men about 2 tablespoons (25 ml) of different olive oils per day for three weeks.  The researchers found that participants' HDL levels increased significantly more after they consumed the olive oil with the highest polyphenol content (6).  In another study, when 42 older adults consumed about 4  tablespoons (50 ml) of high-polyphenol extra virgin olive oil every day for six weeks, their HDL cholesterol increased by 6.5 mg/dl, on average (7).  In addition to raising HDL levels, olive oil has been found to boost HDL's anti-inflammatory and antioxidant function in studies of older people and individuals with high cholesterol levels ( 7, 8, 9).  Whenever possible, select high-quality, certified extra virgin olive oils, which tend to be highest in polyphenols.  Bottom line: Extra virgin olive oil with a high polyphenol content has been shown to increase HDL levels in healthy people, the elderly and individuals with high cholesterol.  2. Follow a low-carb or ketogenic diet  Low-carb and ketogenic diets provide a number of health benefits, including weight loss and reduced blood sugar levels.  They have also been shown to increase HDL cholesterol in people who tend to have lower levels.  This includes those who are obese, insulin resistant or diabetic (10, 11, 12, 13, 14, 15, 16, 17).  In one study, people with type 2 diabetes were split into two groups.  One followed a diet consuming less than 50 grams of carbs per day. The other followed a high-carb diet.  Although both groups lost weight, the low-carb group's HDL cholesterol increased almost twice as much as the high-carb group's did (14).  In another study, obese people who followed a low-carb diet experienced an increase in HDL cholesterol of 5 mg/dl overall.  Meanwhile, in the same study, the participants who ate a low-fat, high-carb diet showed a decrease in HDL cholesterol (15).  This response may partially be due to the higher levels of fat people typically consume on low-carb diets.  One study in overweight women found that diets high in meat and cheese increased HDL levels by 5-8%, compared to a higher-carb diet (18).  What's more, in addition to raising HDL cholesterol, very-low-carb diets have been shown to decrease  triglycerides and improve several other risk factors for heart disease (13, 14, 16, 17).  Bottom line: Low-carb and ketogenic diets typically increase HDL cholesterol levels in people with diabetes, metabolic syndrome and obesity.  3. Exercise regularly  Being physically active is important for heart health.  Studies have shown that many different types of exercise are effective at raising HDL cholesterol, including strength training, high-intensity exercise and aerobic exercise (19, 20, 21, 22, 23, 24).  However, the biggest increases in HDL are typically seen with high-intensity exercise.  One small study followed women who were  living with polycystic ovary syndrome (PCOS), which is linked to a higher risk of insulin resistance. The study required them to perform high-intensity exercise three times a week.  The exercise led to an increase in HDL cholesterol of 8 mg/dL after 10 weeks. The women also showed improvements in other health markers, including decreased insulin resistance and improved arterial function (23).  In a 12-week study, overweight men who performed high-intensity exercise experienced a 10% increase in HDL cholesterol.  In contrast, the low-intensity exercise group showed only a 2% increase and the endurance training group experienced no change (24).  However, even lower-intensity exercise seems to increase HDL's anti-inflammatory and antioxidant capacities, whether or not HDL levels change (20, 21, 25).  Overall, high-intensity exercise such as high-intensity interval training (HIIT) and high-intensity circuit training (HICT) may boost HDL cholesterol levels the most.  Bottom line: Exercising several times per week can help raise HDL cholesterol and enhance its anti-inflammatory and antioxidant effects. High-intensity forms of exercise may be especially effective.  4. Add coconut oil to your diet  Studies have shown that coconut oil may reduce appetite, increase  metabolic rate and help protect brain health, among other benefits.  Some people may be concerned about coconut oil's effects on heart health due to its high saturated fat content.  However, it appears that coconut oil is actually quite heart healthy.  Coconut oil tends to raise HDL cholesterol more than many other types of fat.  In addition, it may improve the ratio of low-density-lipoprotein (LDL) cholesterol, the "bad" cholesterol, to HDL cholesterol. Improving this ratio reduces heart disease risk (26, 27, 28, 29).  One study examined the health effects of coconut oil on 62 women with excess belly fat. The researchers found that participants who took coconut oil daily experienced increased HDL cholesterol and a lower LDL-to-HDL ratio.  In contrast, the group who took soybean oil daily had a decrease in HDL cholesterol and an increase in the LDL-to-HDL ratio (29).  Most studies have found these health benefits occur at a dosage of about 2 tablespoons (30 ml) of coconut oil per day. It's best to incorporate this into cooking rather than eating spoonfuls of coconut oil on their own.  Bottom line: Consuming 2 tablespoons (30 ml) of coconut oil per day may help increase HDL cholesterol levels.  5. Stop smoking  cigarette butt Quitting smoking can reduce the risk of heart disease and lung cancer. Smoking increases the risk of many health problems, including heart disease and lung cancer (30).  One of its negative effects is a suppression of HDL cholesterol.  Some studies have found that quitting smoking can increase HDL levels. Indeed, one study found no significant differences in HDL levels between former smokers and people who had never smoked (31, 32, 33, 34, 35).  In a one-year study of more than 1,500 people, those who quit smoking had twice the increase in HDL as those who resumed smoking within the year. The number of large HDL particles also increased, which further reduced heart  disease risk (32).  One study followed smokers who switched from traditional cigarettes to electronic cigarettes for one year. They found that the switch was associated with an increase in HDL cholesterol of 5 mg/dl, on average (33).  When it comes to the effect of nicotine replacement patches on HDL levels, research results have been mixed.  One study found that nicotine replacement therapy led to higher HDL cholesterol. However, other research suggests that people who use nicotine  patches likely won't see increases in HDL levels until after replacement therapy is completed (34, 36).  Even in studies where HDL cholesterol levels didn't increase after people quit smoking, HDL function improved, resulting in less inflammation and other beneficial effects on heart health (37).  Bottom line: Quitting smoking can increase HDL levels, improve HDL function and help protect heart health.  6. Lose weight  When overweight and obese people lose weight, their HDL cholesterol levels usually increase.  What's more, this benefit seems to occur whether weight loss is achieved by calorie counting, carb restriction, intermittent fasting, weight loss surgery or a combination of diet and exercise (16, 38, 39, 40, 41, 42).  One study examined HDL levels in more than 3,000 overweight and obese [place] adults who followed a lifestyle modification program for one year.  The researchers found that losing at least 6.6 lbs (3 kg) led to an increase in HDL cholesterol of 4 mg/dl, on average (41).  In another study, when obese people with type 2 diabetes consumed calorie-restricted diets that provided 20-30% of calories from protein, they experienced significant increases in HDL cholesterol levels (42).  The key to achieving and maintaining healthy HDL cholesterol levels is choosing the type of diet that makes it easiest for you to lose weight and keep it off.  Bottom Line: Several methods of weight loss have been  shown to increase HDL cholesterol levels in people who are overweight or obese.  7. Choose purple produce  Consuming purple-colored fruits and vegetables is a delicious way to potentially increase HDL cholesterol.  Purple produce contains antioxidants known as anthocyanins.  Studies using anthocyanin extracts have shown that they help fight inflammation, protect your cells from damaging free radicals and may also raise HDL cholesterol levels (43, 44, 45, 46).  In a 24-week study of 22 people with diabetes, those who took an anthocyanin supplement twice a day experienced a 19% increase in HDL cholesterol, on average, along with other improvements in heart health markers (45).  In another study, when people with cholesterol issues took anthocyanin extract for 12 weeks, their HDL cholesterol levels increased by 13.7% (46).  Although these studies used extracts instead of foods, there are several fruits and vegetables that are very high in anthocyanins. These include eggplant, purple corn, red cabbage, blueberries, blackberries and black raspberries.  Bottom line: Consuming fruits and vegetables rich in anthocyanins may help increase HDL cholesterol levels.  8. Eat fatty fish often  The omega-3 fats in fatty fish provide major benefits to heart health, including a reduction in inflammation and better functioning of the cells that line your arteries (47, 48).  There's some research showing that eating fatty fish or taking fish oil may also help raise low levels of HDL cholesterol (49, 50, 51, 52, 53).  In a study of 33 heart disease patients, participants that consumed fatty fish four times per week experienced an increase in HDL cholesterol levels. The particle size of their HDL also increased (52).  In another study, overweight men who consumed herring five days a week for six weeks had a 5% increase in HDL cholesterol, compared with their levels after eating lean pork and chicken five days a  week (53).  However, there are a few studies that found no increase in HDL cholesterol in response to increased fish or omega-3 supplement intake (54, 55).  In addition to herring, other types of fatty fish that may help raise HDL cholesterol include salmon, sardines, mackerel and anchovies.  Bottom line: Eating fatty fish several times per week may help increase HDL cholesterol levels and provide other benefits to heart health.  9. Avoid artificial trans fats  Artificial trans fats have many negative health effects due to their inflammatory properties (56, 57).  There are two types of trans fats. One kind occurs naturally in animal products, including full-fat dairy.  In contrast, the artificial trans fats found in margarines and processed foods are created by adding hydrogen to unsaturated vegetable and seed oils. These fats are also known as industrial trans fats or partially hydrogenated fats.  Research has shown that, in addition to increasing inflammation and contributing to several health problems, these artificial trans fats may lower HDL cholesterol levels.  In one study, researchers compared how people's HDL levels responded when they consumed different margarines.  The study found that participants' HDL cholesterol levels were 10% lower after consuming margarine containing partially hydrogenated soybean oil, compared to their levels after consuming palm oil (58).  Another controlled study followed 40 adults who had diets high in different types of trans fats.  They found that HDL cholesterol levels in women were significantly lower after they consumed the diet high in industrial trans fats, compared to the diet containing naturally occurring trans fats (59).  To protect heart health and keep HDL cholesterol in the healthy range, it's best to avoid artificial trans fats altogether.  Bottom line: Artificial trans fats have been shown to lower HDL levels and increase inflammation,  compared to other fats.  Take home message  Although your HDL cholesterol levels are partly determined by your genetics, there are many things you can do to naturally increase your own levels.  Fortunately, the practices that raise HDL cholesterol often provide other health benefits as well.

## 2019-03-10 ENCOUNTER — Other Ambulatory Visit: Payer: Self-pay | Admitting: Family Medicine

## 2019-03-10 ENCOUNTER — Encounter: Payer: Medicare Other | Attending: Family Medicine | Admitting: Registered"

## 2019-03-10 DIAGNOSIS — R7303 Prediabetes: Secondary | ICD-10-CM | POA: Insufficient documentation

## 2019-03-10 DIAGNOSIS — H00014 Hordeolum externum left upper eyelid: Secondary | ICD-10-CM | POA: Diagnosis not present

## 2019-03-10 DIAGNOSIS — R351 Nocturia: Secondary | ICD-10-CM

## 2019-03-24 ENCOUNTER — Encounter (HOSPITAL_BASED_OUTPATIENT_CLINIC_OR_DEPARTMENT_OTHER): Payer: Medicare Other | Admitting: Registered"

## 2019-03-24 DIAGNOSIS — R7303 Prediabetes: Secondary | ICD-10-CM

## 2019-03-28 ENCOUNTER — Ambulatory Visit: Payer: Medicare Other | Attending: Internal Medicine

## 2019-03-28 DIAGNOSIS — Z20822 Contact with and (suspected) exposure to covid-19: Secondary | ICD-10-CM

## 2019-03-29 LAB — NOVEL CORONAVIRUS, NAA: SARS-CoV-2, NAA: NOT DETECTED

## 2019-03-30 ENCOUNTER — Encounter: Payer: Self-pay | Admitting: Registered"

## 2019-03-30 NOTE — Progress Notes (Signed)
On 03/24/2019 pt completed Session 18 of Diabetes Prevention Program course virtually with [facility]. By the end of this session patients are able to complete the following objectives:   Learning Objectives:  Explain how glucose is used in the body and it's relationship with insulin/insulin resistance.   Identify symptoms of diabetes.   Describe lab tests used to diagnose diabetes.   Describe health complications and conditions related to diabetes.   Goals:   Record weight taken outside of class.   Track foods and beverages eaten each day in the "Food and Activity Tracker," including calories and fat grams for each item.    Track activity type, minutes you were active, and distance you reached each day in the "Food and Activity Tracker."   Follow-Up Plan:  Attend session 19 in two weeks.   Email completed "Food and Activity Trackers" to next session to be reviewed by Lifestyle Coach.

## 2019-04-04 ENCOUNTER — Other Ambulatory Visit: Payer: Self-pay

## 2019-04-04 ENCOUNTER — Other Ambulatory Visit: Payer: Medicare Other

## 2019-04-04 DIAGNOSIS — E785 Hyperlipidemia, unspecified: Secondary | ICD-10-CM

## 2019-04-05 LAB — COMPREHENSIVE METABOLIC PANEL
ALT: 16 IU/L (ref 0–32)
AST: 19 IU/L (ref 0–40)
Albumin/Globulin Ratio: 1.7 (ref 1.2–2.2)
Albumin: 4.4 g/dL (ref 3.8–4.8)
Alkaline Phosphatase: 68 IU/L (ref 39–117)
BUN/Creatinine Ratio: 24 (ref 12–28)
BUN: 16 mg/dL (ref 8–27)
Bilirubin Total: 0.6 mg/dL (ref 0.0–1.2)
CO2: 25 mmol/L (ref 20–29)
Calcium: 10.4 mg/dL — ABNORMAL HIGH (ref 8.7–10.3)
Chloride: 102 mmol/L (ref 96–106)
Creatinine, Ser: 0.66 mg/dL (ref 0.57–1.00)
GFR calc Af Amer: 105 mL/min/{1.73_m2} (ref >59)
GFR calc non Af Amer: 91 mL/min/{1.73_m2} (ref >59)
Globulin, Total: 2.6 g/dL (ref 1.5–4.5)
Glucose: 135 mg/dL — ABNORMAL HIGH (ref 65–99)
Potassium: 3.8 mmol/L (ref 3.5–5.2)
Sodium: 141 mmol/L (ref 134–144)
Total Protein: 7 g/dL (ref 6.0–8.5)

## 2019-04-05 LAB — LIPID PANEL
Chol/HDL Ratio: 2.9 ratio (ref 0.0–4.4)
Cholesterol, Total: 154 mg/dL (ref 100–199)
HDL: 53 mg/dL (ref >39)
LDL Chol Calc (NIH): 84 mg/dL (ref 0–99)
Triglycerides: 90 mg/dL (ref 0–149)
VLDL Cholesterol Cal: 17 mg/dL (ref 5–40)

## 2019-04-06 ENCOUNTER — Encounter: Payer: Self-pay | Admitting: Family Medicine

## 2019-04-06 ENCOUNTER — Other Ambulatory Visit: Payer: Self-pay

## 2019-04-06 ENCOUNTER — Ambulatory Visit (INDEPENDENT_AMBULATORY_CARE_PROVIDER_SITE_OTHER): Payer: Medicare Other | Admitting: Family Medicine

## 2019-04-06 VITALS — BP 121/76 | HR 64 | Temp 97.7°F | Ht 66.0 in | Wt 195.0 lb

## 2019-04-06 DIAGNOSIS — Z719 Counseling, unspecified: Secondary | ICD-10-CM

## 2019-04-06 DIAGNOSIS — R351 Nocturia: Secondary | ICD-10-CM

## 2019-04-06 DIAGNOSIS — K5904 Chronic idiopathic constipation: Secondary | ICD-10-CM | POA: Diagnosis not present

## 2019-04-06 DIAGNOSIS — E669 Obesity, unspecified: Secondary | ICD-10-CM

## 2019-04-06 DIAGNOSIS — Z789 Other specified health status: Secondary | ICD-10-CM | POA: Diagnosis not present

## 2019-04-06 DIAGNOSIS — R350 Frequency of micturition: Secondary | ICD-10-CM

## 2019-04-06 DIAGNOSIS — I10 Essential (primary) hypertension: Secondary | ICD-10-CM

## 2019-04-06 DIAGNOSIS — E785 Hyperlipidemia, unspecified: Secondary | ICD-10-CM

## 2019-04-06 NOTE — Progress Notes (Signed)
Telehealth office visit note for Bailey [name], D.O- at [facility] at [facility]   I connected with current patient today and verified that I am speaking with the correct person using two identifiers.   . Location of the patient: Home . Location of the provider: Office Only the patient (+/- their family members at pt's discretion) and myself were participating in the encounter - This visit type was conducted due to national recommendations for restrictions regarding the COVID-19 Pandemic (e.g. social distancing) in an effort to limit this patient's exposure and mitigate transmission in our community.  This format is felt to be most appropriate for this patient at this time.   - No physical exam could be performed with this format, beyond that communicated to [place] by the patient/ family members as noted.   - Additionally my office staff/ schedulers discussed with the patient that there may be a monetary charge related to this service, depending on their medical insurance.   The patient expressed understanding, and agreed to proceed.       History of Present Illness:  Last OV on 02/15/19 - Started HCTZ 12.5 in afternoon  - started statin every other night  F/up at that time was:  "- Re-check FLP and CMP in 6-8 weeks after starting VERY LOW DOSE statin - Will re-check electrolytes after increasing HCTZ- obtain CMP."  - Last Ov pt was given extensive educational materials on Pre-Dm and what to eat, Low chol diet and how to lower LDL, as well as extensive info on how to inc HDL chol   I, Toni Amend, am serving as Education administrator for [name].    - Hydration States "I'm a water drinker."  Thinks she drinks between 80 to 100 ounces of water per day.  Notes she has a 24 ounce Yeti that she fills up between 3-4 times per day.  Notes "I do drink my water."  - Exercise She exercises using her recumbent bike. Notes "it's better for my knee."  Also tries to engage in "a little  bit of weight lifting" and her stretches.   - Chronic Idiopathic Constipation - has seen GI in remote past and w/up was negative.  Told needed to improve bowel habits States "I know that I drink my water, and maybe I'm not eating nutritionally like I should as far as the fiber to help my bowels regulate more than 2-3 times a week, and I was thinking maybe the water would help me a lot to do that more."    Urinary frequency at night - sees Urology regularly Notes with the additional 12.5 HCTZ we did last OV per recs of her Urologist per his OV notes, she still has to urinate often after she lays down.  Notes "has to get up 2-3 times per night and hardly makes it to the bathroom."  She has discussed this with urology multiple times  ---> She continues drinking water if she wakes up at night; notes "if I get up, I drink more water. I can't help it".   Last Ov we discussed her cutting off water intake 2-3 hrs prior to bed but pt has not done so yet  Says "I feel like I'm full and bloated, but I don't go to the bathroom as much as I would like to go."  Says "I know it has a lot to do with my nutrition, but I need somebody to help me with that."   (( per  subjective notes from [place] with me on 02/15/19:  - [place] that she has been going to nutritionist for consultation.   - Notes "it's been awesome, and I know it's taken me a long time to get all this in my body, and it's going to take me a while to understand the process, understand what I'm putting into my body, and what I can do to help my cholesterol levels."  Says "I've got until August to finish this course."  Her insurance has paid for this.  She has been meeting with nutritionist every week, every Friday.  -  In January this will stretch out to twice a month, and then in March, her visits will stretch out to once per month.  Notes "it's really been a great experience for me."  Says she knows she has a lot to work on and  hasn't dropped a lot of weight, but "this, to me is like learning about my body and what I can do to live healthier.  I just appreciate it from the bottom of my heart that you recommended that I go there, because I do want to get off of my cholesterol medicine and eventually try to get off of my blood pressure medication."  ))    - Weight Concerns Notes she's always had a problem with her weight; "I can be down 10 lbs, and then be up 15 lbs afterward."  Notes she doesn't understand how to control her weight, notes again she is not sure what to eat    HPI:  Hypertension: -  Her blood pressure at home has been running: "Fantastic."  Notes in the last 5 weeks, her BP has been 127/82, 130/81.  Notes once prior to exercise, it was 121/78, and after exercise it was 131/78. - Patient reports good compliance with medication and/or lifestyle modification - Her denies acute concerns or problems related to treatment plan - She denies new onset of: chest pain, exercise intolerance, shortness of breath, dizziness, visual changes etc  Last 3 blood pressure readings in our office are as follows: BP Readings from Last 3 Encounters:  04/06/19 121/76  02/15/19 137/84  08/17/18 125/88   Filed Weights   04/06/19 1321  Weight: 195 lb (88.5 kg)    HPI:  Hyperlipidemia: 69 y.o. female here for cholesterol follow-up.  - Patient reports good compliance with treatment plan of:  medication and/ or lifestyle management.   - Has been taking low dose statin every other night before bedtime- started last OV. - Notes "it wasn't like it was before, but I do every once in a while get a little hinge of a muscle cramp, like going to have a charlie horse, but it goes right away; whereas before I had to actually get up out of the bed it was hurting so bad."  Notes "if it's going to help my cholesterol I'm willing to sacrifice that little bitty feeling like I'm going to get a charlie horse, but it doesn't pan out."  -  Patient denies any acute concerns or problems with management plan   - She denies new onset of concerning sx  Most recent cholesterol panel was:  Lab Results  Component Value Date   CHOL 154 04/04/2019   HDL 53 04/04/2019   LDLCALC 84 04/04/2019   LDLDIRECT 177.1 09/20/2012   TRIG 90 04/04/2019   CHOLHDL 2.9 04/04/2019   Hepatic Function Latest Ref Rng & Units 04/04/2019 08/02/2018 12/17/2016  Total Protein 6.0 -  8.5 g/dL 7.0 7.3 7.1  Albumin 3.8 - 4.8 g/dL 4.4 4.4 4.3  AST 0 - 40 IU/L 19 17 16   ALT 0 - 32 IU/L 16 18 16   Alk Phosphatase 39 - 117 IU/L 68 85 69  Total Bilirubin 0.0 - 1.2 mg/dL 0.6 0.5 0.6  Bilirubin, Direct 0.0 - 0.3 mg/dL - - -       GAD 7 : Generalized Anxiety Score 08/17/2018 07/27/2018  Nervous, Anxious, on Edge 0 0  Control/stop worrying 0 0  Worry too much - different things 0 0  Trouble relaxing 0 0  Restless 0 0  Easily annoyed or irritable 0 0  Afraid - awful might happen 0 0  Total GAD 7 Score 0 0  Anxiety Difficulty Not difficult at all Not difficult at all    Depression screen [facility] 2/9 04/06/2019 02/15/2019 08/17/2018 07/27/2018 01/24/2018  Decreased Interest 0 0 0 0 0  Down, Depressed, Hopeless 0 0 0 0 0  PHQ - 2 Score 0 0 0 0 0  Altered sleeping 0 0 0 0 0  Tired, decreased energy 0 0 0 0 0  Change in appetite 0 0 0 0 0  Feeling bad or failure about yourself  0 0 0 0 0  Trouble concentrating 0 0 0 0 0  Moving slowly or fidgety/restless 0 0 0 0 0  Suicidal thoughts 0 0 0 0 0  PHQ-9 Score 0 0 0 0 0  Difficult doing work/chores - - Not difficult at all Not difficult at all Not difficult at all  Some recent data might be hidden      Impression and Recommendations:    1. Essential hypertension   2. Hyperlipidemia, unspecified hyperlipidemia type- poorly controlled   3. h/o Intolerance of drug- statin   4. Chronic idiopathic constipation   5. Increased urinary frequency   6. Nocturia more than twice per night   7. Obesity (BMI 30-39.9)   8.  Health education/counseling      Chronic Idiopathic Constipation -  - Seen by GI in past.  Pt desires re-eval from them to ensure "nothing is wrong."  - Encouraged patient to speak with gastroenterology regarding her chronic constipation and related concerns. - Per patient, has not been to see gastroenterology for four-five years.  - Ambulatory referral provided to gastroenterology today.  See orders.  - Has seen Dr. Fuller Plan in past and prefers to continue with him. - Patient knows to wait 3-4 business days and if she has not heard from specialist, call Dorothea Ogle at front desk to ask for further assistance.  - Will continue to monitor alongside specialist.  - we discussed extensively nutritional modification ( also advised to follow recs of her nutritionist she is seeing!) as well as OTC miralax to be taken DAILY to help in regulation of bowels.  -  Also d/c pt importance of regular exercise in addition to proper hydration.    Increased Urinary Frequency - Last OV, began HCTZ at lunch time per recs of pt's Urologist to help the patient urinate more during the day so she could decrease her urination at night. - Bp stable but little to no affect on urinary freq at night - Again encouraged patient to dec liquid intake and stop w/in 3 hrs or bedtime  - If her urination continues to be bothersome, encouraged patient to return to her specialist/ Urologist for further assessment.  - Will continue to monitor alongside specialist.   Essential Hypertension - Blood  pressure currently is at goal, stable.  - Patient will continue current treatment regimen.  See med list. - Encouraged patient to continue HCTZ at lunch time as prescribed last OV.  - Counseled patient on pathophysiology of disease and discussed various treatment options, which always includes dietary and lifestyle modification as first line.   - Lifestyle changes such as dash and heart healthy diets and engaging in a regular exercise  program discussed extensively with patient.   She will CONTINUE with her NUTRITIONIST   - Ambulatory blood pressure monitoring encouraged at least 3 times weekly.  Keep log and bring in every office visit.  Reminded patient that if they ever feel poorly in any way, to check their blood pressure and pulse.  - We will continue to monitor    Hyperlipidemia  - Last OV, began low dose statin every other night before bedtime. Much improvement in lipid profile with this small amount of statin!  - last FLP obtained 1 month ago: Triglycerides = down to 90 from 125 prior. HDL = 53, stable from 53 prior. LDL = down to 84 from 137 prior.  - Pt will continue current treatment regimen.  See med list. - Patient IS tolerating statin very well with minor muscle cramping that only lasts a few seconds and occurs rarely.    - Strongly encouraged patient to hydrate adequately and engage in daily physical activity, especially a formal exercise routine.  - Prudent dietary changes such as low saturated & trans fat diets for hyperlipidemia and low carb diets for hypertriglyceridemia discussed with patient.  - Again, told pt I would provide her with educational handouts that she could find on MyChart and also she will continue with her nutritionist who she sees every two weeks now, in Dec- saw nutrition every week    - To help increase and maintain HDL, encouraged patient to follow AHA guidelines for regular exercise and also engage in weight loss if BMI above 25.   - We will continue to monitor and re-check as discussed.   BMI Counseling - Body mass index is 31.47 kg/m Explained to patient what BMI refers to, and what it means medically.    Told patient to think about it as a "medical risk stratification measurement" and how increasing BMI is associated with increasing risk/ or worsening state of various diseases such as hypertension, hyperlipidemia, diabetes, premature OA, depression etc.  American Heart  Association guidelines for healthy diet, basically Mediterranean diet, and exercise guidelines of 30 minutes 5 days per week or more discussed in detail.  Health counseling performed.  All questions answered.  - Patient with desire to work on losing weight. - Ambulatory referral to [facility] d/c pt today and pt desired referral.  See orders.    Lifestyle & Preventative Health Maintenance - Advised patient to continue working toward exercising and prudent weight loss to improve overall mental, physical, and emotional health.    - Reviewed the "spokes of the wheel" of mood and health management.  Stressed the importance of ongoing prudent habits, including regular exercise, appropriate sleep hygiene, healthful dietary habits, and prayer/meditation to relax.  - Encouraged patient to engage in daily physical activity as tolerated, especially a formal exercise routine.  Recommended that the patient eventually strive for at least 150 minutes of moderate cardiovascular activity per week according to guidelines established by the [facility].   - Healthy dietary habits encouraged, including low-carb, and high amounts of lean protein in diet.   -  Patient should also consume adequate amounts of water.  - Health counseling performed.  All questions answered.   - As part of my medical decision making, I reviewed the following data within the [place] History obtained from pt, Labs reviewed and OV notes from prior OV's with me, as well as other specialists she/he has seen since seeing me last, were all reviewed and used in my medical decision making process today.    - Additionally, discussion had with patient regarding our treatment plan, and their biases/concerns about that plan were used in my medical decision making today.    - The patient agreed with the plan and demonstrated an understanding of the instructions.   No barriers to understanding were identified.       Return for f/up in 3-4 months to assess BP and chronic concerns.     Orders Placed This Encounter  Procedures  . Ambulatory referral to Gastroenterology  . Amb Ref to Medical Weight Management    I provided 23+ minutes of non face-to-face time during this encounter.  Additional time was spent with charting and coordination of care before and after the actual visit commenced.   Note:  This note was prepared with assistance of Dragon voice recognition software. Occasional wrong-word or sound-a-like substitutions may have occurred due to the inherent limitations of voice recognition software.  This document serves as a record of services personally performed by Bailey Dance, DO. It was created on her behalf by Toni Amend, a trained medical scribe. The creation of this record is based on the scribe's personal observations and the provider's statements to them.   This case required medical decision making of at least moderate complexity. The above documentation from Toni Amend, medical scribe, has been reviewed by Marjory Sneddon, D.O.       Patient Care Team    Relationship Specialty Notifications Start End  Bailey Dance, DO PCP - General Family Medicine  12/05/15   Marchia Bond, MD  Orthopedic Surgery  04/07/12   Ladene Artist, MD  Gastroenterology  07/27/12   [facility], [facility]    02/15/19    Comment: MAcDermoid- urology  Loney Loh, MD  Dermatology  04/06/19      -Vitals obtained; medications/ allergies reconciled;  personal medical, social, Sx etc.histories were updated by CMA, reviewed by me and are reflected in chart   Patient Active Problem List   Diagnosis Date Noted  . Obesity (BMI 30-39.9) 06/22/2013  . Dyslipidemia 09/27/2008  . Hyperlipidemia 09/27/2008  . Essential hypertension 06/28/2008  . Prediabetes 08/17/2018  . Hematuria 03/13/2016  . Chronic Fatigue 07/27/2014  . Chronic constipation- sees GI 07/27/2014   . h/o Intolerance of drug- statin 07/14/2017  . Acute reaction to situational stress 08/17/2016  . Hx of cardiovascular stress test 12/22/2015  . Vitamin D deficiency 12/22/2015  . Generalized OA 10/29/2014  . Asymptomatic postmenopausal status 01/04/2009  . Increased urinary frequency 04/06/2019  . Statin declined 02/15/2019  . Nocturia more than twice per night 02/15/2019  . Statin intolerance-   muscle cramps 02/15/2019  . Incontinence in female 07/27/2018  . Nocturia-chronic 07/27/2018  . Actinic keratoses 09/30/2017  . 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 Inactivity 07/14/2017  . Dysuria 06/23/2017  . Abnormal urinalysis 06/23/2017  . Chronic pain of right knee 12/21/2016  . Abnormality of heart beat-  08/17/2016  . GAD (generalized anxiety disorder) 08/17/2016  . Pelvic pressure in  female 03/13/2016  . Osteoarthritis of knee, unspecified 10/29/2014  . Rectus diastasis 08/09/2013  . Obesity 06/22/2013  . Gallstone 07/27/2012  . Acute diverticulitis 07/27/2012  . Partial tear of subscapularis tendon 04/22/2012  . Supraspinatus tendon tear 04/22/2012  . Trochanteric bursitis of right hip      Current Meds  Medication Sig  . aspirin EC 81 MG tablet Take 1 tablet (81 mg total) by mouth daily.  Marland Kitchen ezetimibe (ZETIA) 10 MG tablet Take 1 tablet (10 mg total) by mouth at bedtime.  . hydrochlorothiazide (HYDRODIURIL) 12.5 MG tablet TAKE 1 TABLET BY MOUTH EVERY DAY  . Olmesartan-amLODIPine-HCTZ 20-5-12.5 MG TABS Take 1 tablet by mouth daily.  . rosuvastatin (CRESTOR) 5 MG tablet 0.5 tab every other night before bedtime  . Vitamin D, Ergocalciferol, (DRISDOL) 1.25 MG (50000 UT) CAPS capsule TAKE 1 CAPSULE BY MOUTH EVERY 7 DAYS     Allergies:  Allergies  Allergen Reactions  . Codeine Nausea Only  . Statins      ROS:  See above HPI for pertinent positives and negatives   Objective:   Blood pressure 121/76, pulse 64, temperature 97.7 F (36.5  C), height 5' 6"  (1.676 m), weight 195 lb (88.5 kg).  (if some vitals are omitted, this means that patient was UNABLE to obtain them even though they were asked to get them prior to OV today.  They were asked to call us at their earliest convenience with these once obtained. )  General: A & O * 3; sounds in no acute distress; in usual state of health.  Respiratory: speaking in full sentences, no conversational dyspnea; patient confirms no use of accessory muscles Psych: insight appears good, mood- appears full

## 2019-04-07 ENCOUNTER — Encounter: Payer: Medicare Other | Attending: Family Medicine | Admitting: Registered"

## 2019-04-07 DIAGNOSIS — R7303 Prediabetes: Secondary | ICD-10-CM | POA: Insufficient documentation

## 2019-04-08 NOTE — Patient Instructions (Addendum)
Your goal blood pressure should be less than 140/90 on a regular basis, or medications should be started/ modified.       Hypertension Hypertension, commonly called high blood pressure, is when the force of blood pumping through the arteries is too strong. The arteries are the blood vessels that carry blood from the heart throughout the body. Hypertension forces the heart to work harder to pump blood and may cause arteries to become narrow or stiff. Having untreated or uncontrolled hypertension can cause heart attacks, strokes, kidney disease, and other problems. A blood pressure reading consists of a higher number over a lower number. Ideally, your blood pressure should be below 120/80. The first ("top") number is called the systolic pressure. It is a measure of the pressure in your arteries as your heart beats. The second ("bottom") number is called the diastolic pressure. It is a measure of the pressure in your arteries as the heart relaxes. What are the causes? The cause of this condition is not known. What increases the risk? Some risk factors for high blood pressure are under your control. Others are not. Factors you can change  Smoking.  Having type 2 diabetes mellitus, high cholesterol, or both.  Not getting enough exercise or physical activity.  Being overweight.  Having too much fat, sugar, calories, or salt (sodium) in your diet.  Drinking too much alcohol. Factors that are difficult or impossible to change  Having chronic kidney disease.  Having a family history of high blood pressure.  Age. Risk increases with age.  Race. You may be at higher risk if you are African-American.  Gender. Men are at higher risk than women before age 65. After age 38, women are at higher risk than men.  Having obstructive sleep apnea.  Stress. What are the signs or symptoms? Extremely high blood pressure (hypertensive crisis) may cause:  Headache.  Anxiety.  Shortness of  breath.  Nosebleed.  Nausea and vomiting.  Severe chest pain.  Jerky movements you cannot control (seizures).  How is this diagnosed? This condition is diagnosed by measuring your blood pressure while you are seated, with your arm resting on a surface. The cuff of the blood pressure monitor will be placed directly against the skin of your upper arm at the level of your heart. It should be measured at least twice using the same arm. Certain conditions can cause a difference in blood pressure between your right and left arms. Certain factors can cause blood pressure readings to be lower or higher than normal (elevated) for a short period of time:  When your blood pressure is higher when you are in a health care provider's office than when you are at home, this is called white coat hypertension. Most people with this condition do not need medicines.  When your blood pressure is higher at home than when you are in a health care provider's office, this is called masked hypertension. Most people with this condition may need medicines to control blood pressure.  If you have a high blood pressure reading during one visit or you have normal blood pressure with other risk factors:  You may be asked to return on a different day to have your blood pressure checked again.  You may be asked to monitor your blood pressure at home for 1 week or longer.  If you are diagnosed with hypertension, you may have other blood or imaging tests to help your health care provider understand your overall risk for other conditions. How  is this treated? This condition is treated by making healthy lifestyle changes, such as eating healthy foods, exercising more, and reducing your alcohol intake. Your health care provider may prescribe medicine if lifestyle changes are not enough to get your blood pressure under control, and if:  Your systolic blood pressure is above 130.  Your diastolic blood pressure is above  80.  Your personal target blood pressure may vary depending on your medical conditions, your age, and other factors. Follow these instructions at home: Eating and drinking  Eat a diet that is high in fiber and potassium, and low in sodium, added sugar, and fat. An example eating plan is called the DASH (Dietary Approaches to Stop Hypertension) diet. To eat this way: ? Eat plenty of fresh fruits and vegetables. Try to fill half of your plate at each meal with fruits and vegetables. ? Eat whole grains, such as whole wheat pasta, brown rice, or whole grain bread. Fill about one quarter of your plate with whole grains. ? Eat or drink low-fat dairy products, such as skim milk or low-fat yogurt. ? Avoid fatty cuts of meat, processed or cured meats, and poultry with skin. Fill about one quarter of your plate with lean proteins, such as fish, chicken without skin, beans, eggs, and tofu. ? Avoid premade and processed foods. These tend to be higher in sodium, added sugar, and fat.  Reduce your daily sodium intake. Most people with hypertension should eat less than 1,500 mg of sodium a day.  Limit alcohol intake to no more than 1 drink a day for nonpregnant women and 2 drinks a day for men. One drink equals 12 oz of beer, 5 oz of wine, or 1 oz of hard liquor. Lifestyle  Work with your health care provider to maintain a healthy body weight or to lose weight. Ask what an ideal weight is for you.  Get at least 30 minutes of exercise that causes your heart to beat faster (aerobic exercise) most days of the week. Activities may include walking, swimming, or biking.  Include exercise to strengthen your muscles (resistance exercise), such as pilates or lifting weights, as part of your weekly exercise routine. Try to do these types of exercises for 30 minutes at least 3 days a week.  Do not use any products that contain nicotine or tobacco, such as cigarettes and e-cigarettes. If you need help quitting, ask  your health care provider.  Monitor your blood pressure at home as told by your health care provider.  Keep all follow-up visits as told by your health care provider. This is important. Medicines  Take over-the-counter and prescription medicines only as told by your health care provider. Follow directions carefully. Blood pressure medicines must be taken as prescribed.  Do not skip doses of blood pressure medicine. Doing this puts you at risk for problems and can make the medicine less effective.  Ask your health care provider about side effects or reactions to medicines that you should watch for. Contact a health care provider if:  You think you are having a reaction to a medicine you are taking.  You have headaches that keep coming back (recurring).  You feel dizzy.  You have swelling in your ankles.  You have trouble with your vision. Get help right away if:  You develop a severe headache or confusion.  You have unusual weakness or numbness.  You feel faint.  You have severe pain in your chest or abdomen.  You vomit repeatedly.  You have trouble breathing. Summary  Hypertension is when the force of blood pumping through your arteries is too strong. If this condition is not controlled, it may put you at risk for serious complications.  Your personal target blood pressure may vary depending on your medical conditions, your age, and other factors. For most people, a normal blood pressure is less than 120/80.  Hypertension is treated with lifestyle changes, medicines, or a combination of both. Lifestyle changes include weight loss, eating a healthy, low-sodium diet, exercising more, and limiting alcohol. This information is not intended to replace advice given to you by your health care provider. Make sure you discuss any questions you have with your health care provider. Document Released: 02/16/2005 Document Revised: 01/15/2016 Document Reviewed: 01/15/2016 Elsevier  Interactive Patient Education  2018 [employer].    How to Take Your Blood Pressure   Blood pressure is a measurement of how strongly your blood is pressing against the walls of your arteries. Arteries are blood vessels that carry blood from your heart throughout your body. Your health care provider takes your blood pressure at each office visit. You can also take your own blood pressure at home with a blood pressure machine. You may need to take your own blood pressure:  To confirm a diagnosis of high blood pressure (hypertension).  To monitor your blood pressure over time.  To make sure your blood pressure medicine is working.  Supplies needed: To take your blood pressure, you will need a blood pressure machine. You can buy a blood pressure machine, or blood pressure monitor, at most drugstores or online. There are several types of home blood pressure monitors. When choosing one, consider the following:  Choose a monitor that has an arm cuff.  Choose a monitor that wraps snugly around your upper arm. You should be able to fit only one finger between your arm and the cuff.  Do not choose a monitor that measures your blood pressure from your wrist or finger.  Your health care provider can suggest a reliable monitor that will meet your needs. How to prepare To get the most accurate reading, avoid the following for 30 minutes before you check your blood pressure:  Drinking caffeine.  Drinking alcohol.  Eating.  Smoking.  Exercising.  Five minutes before you check your blood pressure:  Empty your bladder.  Sit quietly without talking in a dining chair, rather than in a soft couch or armchair.  How to take your blood pressure To check your blood pressure, follow the instructions in the manual that came with your blood pressure monitor. If you have a digital blood pressure monitor, the instructions may be as follows: 1. Sit up straight. 2. Place your feet on the floor. Do  not cross your ankles or legs. 3. Rest your left arm at the level of your heart on a table or desk or on the arm of a chair. 4. Pull up your shirt sleeve. 5. Wrap the blood pressure cuff around the upper part of your left arm, 1 inch (2.5 cm) above your elbow. It is best to wrap the cuff around bare skin. 6. Fit the cuff snugly around your arm. You should be able to place only one finger between the cuff and your arm. 7. Position the cord inside the groove of your elbow. 8. Press the power button. 9. Sit quietly while the cuff inflates and deflates. 10. Read the digital reading on the monitor screen and write it down (record it).  11. Wait 2-3 minutes, then repeat the steps, starting at step 1.  What does my blood pressure reading mean? A blood pressure reading consists of a higher number over a lower number. Ideally, your blood pressure should be below 120/80. The first ("top") number is called the systolic pressure. It is a measure of the pressure in your arteries as your heart beats. The second ("bottom") number is called the diastolic pressure. It is a measure of the pressure in your arteries as the heart relaxes. Blood pressure is classified into four stages. The following are the stages for adults who do not have a short-term serious illness or a chronic condition. Systolic pressure and diastolic pressure are measured in a unit called mm Hg. Normal  Systolic pressure: below 123456.  Diastolic pressure: below 80. Elevated  Systolic pressure: [id number].  Diastolic pressure: below 80. Hypertension stage 1  Systolic pressure: 0000000.  Diastolic pressure: [id number]. Hypertension stage 2  Systolic pressure: [id number] or above.  Diastolic pressure: 90 or above. You can have prehypertension or hypertension even if only the systolic or only the diastolic number in your reading is higher than normal. Follow these instructions at home:  Check your blood pressure as often as recommended by your  health care provider.  Take your monitor to the next appointment with your health care provider to make sure: ? That you are using it correctly. ? That it provides accurate readings.  Be sure you understand what your goal blood pressure numbers are.  Tell your health care provider if you are having any side effects from blood pressure medicine. Contact a health care provider if:  Your blood pressure is consistently high. Get help right away if:  Your systolic blood pressure is higher than 180.  Your diastolic blood pressure is higher than 110. This information is not intended to replace advice given to you by your health care provider. Make sure you discuss any questions you have with your health care provider. Document Released: 07/26/2015 Document Revised: 10/08/2015 Document Reviewed: 07/26/2015 Elsevier Interactive Patient Education  2018 [employer] for a Low Cholesterol, Low Saturated Fat Diet   Fats - Limit total intake of fats and oils. - Avoid butter, stick margarine, shortening, lard, palm and coconut oils. - Limit mayonnaise, salad dressings, gravies and sauces, unless they are homemade with low-fat ingredients. - Limit chocolate. - Choose low-fat and nonfat products, such as low-fat mayonnaise, low-fat or non-hydrogenated peanut butter, low-fat or fat-free salad dressings and nonfat gravy. - Use vegetable oil, such as canola or olive oil. - Look for margarine that does not contain trans fatty acids. - Use nuts in moderate amounts. - Read ingredient labels carefully to determine both amount and type of fat present in foods. Limit saturated and trans fats! - Avoid high-fat processed and convenience foods.  Meats and Meat Alternatives - Choose fish, chicken, [place] and lean meats. - Use dried beans, peas, lentils and tofu. - Limit egg yolks to three to four per week. - If you eat red meat, limit to no more than three servings per week and choose  loin or round cuts. - Avoid fatty meats, such as bacon, sausage, franks, luncheon meats and ribs. - Avoid all organ meats, including liver.  Dairy - Choose nonfat or low-fat milk, yogurt and cottage cheese. - Most cheeses are high in fat. Choose cheeses made from non-fat milk, such as mozzarella and ricotta cheese. - Choose light or fat-free cream cheese and  sour cream. - Avoid cream and sauces made with cream.  Fruits and Vegetables - Eat a wide variety of fruits and vegetables. - Use lemon juice, vinegar or "mist" olive oil on vegetables. - Avoid adding sauces, fat or oil to vegetables.  Breads, Cereals and Grains - Choose whole-grain breads, cereals, pastas and rice. - Avoid high-fat snack foods, such as granola, cookies, pies, pastries, doughnuts and croissants.  Cooking Tips - Avoid deep fried foods. - Trim visible fat off meats and remove skin from poultry before cooking. - Bake, broil, boil, poach or roast poultry, fish and lean meats. - Drain and discard fat that drains out of meat as you cook it. - Add little or no fat to foods. - Use vegetable oil sprays to grease pans for cooking or baking. - Steam vegetables. - Use herbs or no-oil marinades to flavor foods.         Nine ways to increase your "good" HDL cholesterol  High-density lipoprotein (HDL) is often referred to as the "good" cholesterol. Having high HDL levels helps carry cholesterol from your arteries to your liver, where it can be used or excreted.  Having high levels of HDL also has antioxidant and anti-inflammatory effects, and is linked to a reduced risk of heart disease (1, 2).  Most health experts recommend minimum blood levels of 40 mg/dl in men and 50 mg/dl in women.  While genetics definitely play a role, there are several other factors that affect HDL levels.  Here are nine healthy ways to raise your "good" HDL cholesterol.  1. Consume olive oil  two pieces of salmon on a plate olive oil  being poured into a small dish Extra virgin olive oil may be more healthful than processed olive oils. Olive oil is one of the healthiest fats around.  A large analysis of 42 studies with more than 800,000 participants found that olive oil was the only source of monounsaturated fat that seemed to reduce heart disease risk (3).  Research has shown that one of olive oil's heart-healthy effects is an increase in HDL cholesterol. This effect is thought to be caused by antioxidants it contains called polyphenols (4, 5, 6, 7).  Extra virgin olive oil has more polyphenols than more processed olive oils, although the amount can still vary among different types and brands.  One study gave 200 healthy young men about 2 tablespoons (25 ml) of different olive oils per day for three weeks.  The researchers found that participants' HDL levels increased significantly more after they consumed the olive oil with the highest polyphenol content (6).  In another study, when 48 older adults consumed about 4 tablespoons (50 ml) of high-polyphenol extra virgin olive oil every day for six weeks, their HDL cholesterol increased by 6.5 mg/dl, on average (7).  In addition to raising HDL levels, olive oil has been found to boost HDL's anti-inflammatory and antioxidant function in studies of older people and individuals with high cholesterol levels ( 7, 8, 9).  Whenever possible, select high-quality, certified extra virgin olive oils, which tend to be highest in polyphenols.  Bottom line: Extra virgin olive oil with a high polyphenol content has been shown to increase HDL levels in healthy people, the elderly and individuals with high cholesterol.  2. Follow a low-carb or ketogenic diet  Low-carb and ketogenic diets provide a number of health benefits, including weight loss and reduced blood sugar levels.  They have also been shown to increase HDL cholesterol in people who tend  to have lower levels.  This includes  those who are obese, insulin resistant or diabetic (10, 11, 12, 13, 14, 15, 16, 17).  In one study, people with type 2 diabetes were split into two groups.  One followed a diet consuming less than 50 grams of carbs per day. The other followed a high-carb diet.  Although both groups lost weight, the low-carb group's HDL cholesterol increased almost twice as much as the high-carb group's did (14).  In another study, obese people who followed a low-carb diet experienced an increase in HDL cholesterol of 5 mg/dl overall.  Meanwhile, in the same study, the participants who ate a low-fat, high-carb diet showed a decrease in HDL cholesterol (15).  This response may partially be due to the higher levels of fat people typically consume on low-carb diets.  One study in overweight women found that diets high in meat and cheese increased HDL levels by 5-8%, compared to a higher-carb diet (18).  What's more, in addition to raising HDL cholesterol, very-low-carb diets have been shown to decrease triglycerides and improve several other risk factors for heart disease (13, 14, 16, 17).  Bottom line: Low-carb and ketogenic diets typically increase HDL cholesterol levels in people with diabetes, metabolic syndrome and obesity.  3. Exercise regularly  Being physically active is important for heart health.  Studies have shown that many different types of exercise are effective at raising HDL cholesterol, including strength training, high-intensity exercise and aerobic exercise (19, 20, 21, 22, 23, 24).  However, the biggest increases in HDL are typically seen with high-intensity exercise.  One small study followed women who were living with polycystic ovary syndrome (PCOS), which is linked to a higher risk of insulin resistance. The study required them to perform high-intensity exercise three times a week.  The exercise led to an increase in HDL cholesterol of 8 mg/dL after 10 weeks. The women also showed  improvements in other health markers, including decreased insulin resistance and improved arterial function (23).  In a 12-week study, overweight men who performed high-intensity exercise experienced a 10% increase in HDL cholesterol.  In contrast, the low-intensity exercise group showed only a 2% increase and the endurance training group experienced no change (24).  However, even lower-intensity exercise seems to increase HDL's anti-inflammatory and antioxidant capacities, whether or not HDL levels change (20, 21, 25).  Overall, high-intensity exercise such as high-intensity interval training (HIIT) and high-intensity circuit training (HICT) may boost HDL cholesterol levels the most.  Bottom line: Exercising several times per week can help raise HDL cholesterol and enhance its anti-inflammatory and antioxidant effects. High-intensity forms of exercise may be especially effective.  4. Add coconut oil to your diet  Studies have shown that coconut oil may reduce appetite, increase metabolic rate and help protect brain health, among other benefits.  Some people may be concerned about coconut oil's effects on heart health due to its high saturated fat content.  However, it appears that coconut oil is actually quite heart healthy.  Coconut oil tends to raise HDL cholesterol more than many other types of fat.  In addition, it may improve the ratio of low-density-lipoprotein (LDL) cholesterol, the "bad" cholesterol, to HDL cholesterol. Improving this ratio reduces heart disease risk (26, 27, 28, 29).  One study examined the health effects of coconut oil on 32 women with excess belly fat. The researchers found that participants who took coconut oil daily experienced increased HDL cholesterol and a lower LDL-to-HDL ratio.  In contrast, the group  who took soybean oil daily had a decrease in HDL cholesterol and an increase in the LDL-to-HDL ratio (29).  Most studies have found these health benefits  occur at a dosage of about 2 tablespoons (30 ml) of coconut oil per day. It's best to incorporate this into cooking rather than eating spoonfuls of coconut oil on their own.  Bottom line: Consuming 2 tablespoons (30 ml) of coconut oil per day may help increase HDL cholesterol levels.  5. Stop smoking  cigarette butt Quitting smoking can reduce the risk of heart disease and lung cancer. Smoking increases the risk of many health problems, including heart disease and lung cancer (30).  One of its negative effects is a suppression of HDL cholesterol.  Some studies have found that quitting smoking can increase HDL levels. Indeed, one study found no significant differences in HDL levels between former smokers and people who had never smoked (31, 32, 33, 34, 35).  In a one-year study of more than 1,500 people, those who quit smoking had twice the increase in HDL as those who resumed smoking within the year. The number of large HDL particles also increased, which further reduced heart disease risk (32).  One study followed smokers who switched from traditional cigarettes to electronic cigarettes for one year. They found that the switch was associated with an increase in HDL cholesterol of 5 mg/dl, on average (33).  When it comes to the effect of nicotine replacement patches on HDL levels, research results have been mixed.  One study found that nicotine replacement therapy led to higher HDL cholesterol. However, other research suggests that people who use nicotine patches likely won't see increases in HDL levels until after replacement therapy is completed (34, 36).  Even in studies where HDL cholesterol levels didn't increase after people quit smoking, HDL function improved, resulting in less inflammation and other beneficial effects on heart health (37).  Bottom line: Quitting smoking can increase HDL levels, improve HDL function and help protect heart health.  6. Lose weight  When overweight  and obese people lose weight, their HDL cholesterol levels usually increase.  What's more, this benefit seems to occur whether weight loss is achieved by calorie counting, carb restriction, intermittent fasting, weight loss surgery or a combination of diet and exercise (16, 38, 39, 40, 41, 42).  One study examined HDL levels in more than 3,000 overweight and obese [place] adults who followed a lifestyle modification program for one year.  The researchers found that losing at least 6.6 lbs (3 kg) led to an increase in HDL cholesterol of 4 mg/dl, on average (41).  In another study, when obese people with type 2 diabetes consumed calorie-restricted diets that provided 20-30% of calories from protein, they experienced significant increases in HDL cholesterol levels (42).  The key to achieving and maintaining healthy HDL cholesterol levels is choosing the type of diet that makes it easiest for you to lose weight and keep it off.  Bottom Line: Several methods of weight loss have been shown to increase HDL cholesterol levels in people who are overweight or obese.  7. Choose purple produce  Consuming purple-colored fruits and vegetables is a delicious way to potentially increase HDL cholesterol.  Purple produce contains antioxidants known as anthocyanins.  Studies using anthocyanin extracts have shown that they help fight inflammation, protect your cells from damaging free radicals and may also raise HDL cholesterol levels (43, 44, 45, 46).  In a 24-week study of 85 people with diabetes, those who took  an anthocyanin supplement twice a day experienced a 19% increase in HDL cholesterol, on average, along with other improvements in heart health markers (45).  In another study, when people with cholesterol issues took anthocyanin extract for 12 weeks, their HDL cholesterol levels increased by 13.7% (46).  Although these studies used extracts instead of foods, there are several fruits and vegetables  that are very high in anthocyanins. These include eggplant, purple corn, red cabbage, blueberries, blackberries and black raspberries.  Bottom line: Consuming fruits and vegetables rich in anthocyanins may help increase HDL cholesterol levels.  8. Eat fatty fish often  The omega-3 fats in fatty fish provide major benefits to heart health, including a reduction in inflammation and better functioning of the cells that line your arteries (47, 48).  There's some research showing that eating fatty fish or taking fish oil may also help raise low levels of HDL cholesterol (49, 50, 51, 52, 53).  In a study of 33 heart disease patients, participants that consumed fatty fish four times per week experienced an increase in HDL cholesterol levels. The particle size of their HDL also increased (52).  In another study, overweight men who consumed herring five days a week for six weeks had a 5% increase in HDL cholesterol, compared with their levels after eating lean pork and chicken five days a week (53).  However, there are a few studies that found no increase in HDL cholesterol in response to increased fish or omega-3 supplement intake (54, 55).  In addition to herring, other types of fatty fish that may help raise HDL cholesterol include salmon, sardines, mackerel and anchovies.  Bottom line: Eating fatty fish several times per week may help increase HDL cholesterol levels and provide other benefits to heart health.  9. Avoid artificial trans fats  Artificial trans fats have many negative health effects due to their inflammatory properties (56, 57).  There are two types of trans fats. One kind occurs naturally in animal products, including full-fat dairy.  In contrast, the artificial trans fats found in margarines and processed foods are created by adding hydrogen to unsaturated vegetable and seed oils. These fats are also known as industrial trans fats or partially hydrogenated fats.  Research has  shown that, in addition to increasing inflammation and contributing to several health problems, these artificial trans fats may lower HDL cholesterol levels.  In one study, researchers compared how people's HDL levels responded when they consumed different margarines.  The study found that participants' HDL cholesterol levels were 10% lower after consuming margarine containing partially hydrogenated soybean oil, compared to their levels after consuming palm oil (58).  Another controlled study followed 40 adults who had diets high in different types of trans fats.  They found that HDL cholesterol levels in women were significantly lower after they consumed the diet high in industrial trans fats, compared to the diet containing naturally occurring trans fats (59).  To protect heart health and keep HDL cholesterol in the healthy range, it's best to avoid artificial trans fats altogether.  Bottom line: Artificial trans fats have been shown to lower HDL levels and increase inflammation, compared to other fats.  Take home message  Although your HDL cholesterol levels are partly determined by your genetics, there are many things you can do to naturally increase your own levels.  Fortunately, the practices that raise HDL cholesterol often provide other health benefits as well.         Constipation, Adult Constipation is when a person has  fewer bowel movements in a week than normal, has difficulty having a bowel movement, or has stools that are dry, hard, or larger than normal. Constipation may be caused by an underlying condition. It may become worse with age if a person takes certain medicines and does not take in enough fluids. Follow these instructions at home: Eating and drinking  Eat foods that have a lot of fiber, such as fresh fruits and vegetables, whole grains, and beans.  Limit foods that are high in fat, low in fiber, or overly processed, such as french fries, hamburgers,  cookies, candies, and soda.  Drink enough fluid to keep your urine clear or pale yellow. General instructions  Exercise regularly or as told by your health care provider.  Go to the restroom when you have the urge to go. Do not hold it in.  Take over-the-counter and prescription medicines only as told by your health care provider. These include any fiber supplements.  Practice pelvic floor retraining exercises, such as deep breathing while relaxing the lower abdomen and pelvic floor relaxation during bowel movements.  Watch your condition for any changes.  Keep all follow-up visits as told by your health care provider. This is important. Contact a health care provider if:  You have pain that gets worse.  You have a fever.  You do not have a bowel movement after 4 days.  You vomit.  You are not hungry.  You lose weight.  You are bleeding from the anus.  You have thin, pencil-like stools. Get help right away if:  You have a fever and your symptoms suddenly get worse.  You leak stool or have blood in your stool.  Your abdomen is bloated.  You have severe pain in your abdomen.  You feel dizzy or you faint. This information is not intended to replace advice given to you by your health care provider. Make sure you discuss any questions you have with your health care provider. Document Revised: 01/29/2017 Document Reviewed: 08/07/2015 Elsevier Patient Education  [place] Modification Ideas for Weight Management  Weight management involves adopting a healthy lifestyle that includes a knowledge of nutrition and exercise, a positive attitude and the right kind of motivation. Internal motives such as better health, increased energy, self-esteem and personal control increase your chances of lifelong weight management success.  Remember to have realistic goals and think long-term success. Believe in yourself and you can do it. The following  information will give you ideas to help you meet your goals.  Control Your Home Environment  Eat only while sitting down at the kitchen or dining room table. Do not eat while watching television, reading, cooking, talking on the phone, standing at the refrigerator or working on the computer. Keep tempting foods out of the house -- don't buy them. Keep tempting foods out of sight. Have low-calorie foods ready to eat. Unless you are preparing a meal, stay out of the kitchen. Have healthy snacks at your disposal, such as small pieces of fruit, vegetables, canned fruit, pretzels, low-fat string cheese and nonfat cottage cheese.  Control Your Work Environment  Do not eat at [employer] or keep tempting snacks at your desk. If you get hungry between meals, plan healthy snacks and bring them with you to work. During your breaks, go for a walk instead of eating. If you work around food, plan in advance the one item you will eat at mealtime. Make it inconvenient to nibble  on food by chewing gum, sugarless candy or drinking water or another low-calorie beverage. Do not work through meals. Skipping meals slows down metabolism and may result in overeating at the next meal. If food is available for special occasions, either pick the healthiest item, nibble on low-fat snacks brought from home, don't have anything offered, choose one option and have a small amount, or have only a beverage.  Control Your Mealtime Environment  Serve your plate of food at the stove or kitchen counter. Do not put the serving dishes on the table. If you do put dishes on the table, remove them immediately when finished eating. Fill half of your plate with vegetables, a quarter with lean protein and a quarter with starch. Use smaller plates, bowls and glasses. A smaller portion will look large when it is in a little dish. Politely refuse second helpings. When fixing your plate, limit portions of food to one scoop/serving or  less.   Daily Food Management  Replace eating with another activity that you will not associate with food. Wait 20 minutes before eating something you are craving. Drink a large glass of water or diet soda before eating. Always have a big glass or bottle of water to drink throughout the day. Avoid high-calorie add-ons such as cream with your coffee, butter, mayonnaise and salad dressings.  Shopping: Do not shop when hungry or tired. Shop from a list and avoid buying anything that is not on your list. If you must have tempting foods, buy individual-sized packages and try to find a lower-calorie alternative. Don't taste test in the store. Read food labels. Compare products to help you make the healthiest choices.  Preparation: Chew a piece of gum while cooking meals. Use a quarter teaspoon if you taste test your food. Try to only fix what you are going to eat, leaving yourself no chance for seconds. If you have prepared more food than you need, portion it into individual containers and freeze or refrigerate immediately. Don't snack while cooking meals.  Eating: Eat slowly. Remember it takes about 20 minutes for your stomach to send a message to your brain that it is full. Don't let fake hunger make you think you need more. The ideal way to eat is to take a bite, put your utensil down, take a sip of water, cut your next bite, take a bit, put your utensil down and so on. Do not cut your food all at one time. Cut only as needed. Take small bites and chew your food well. Stop eating for a minute or two at least once during a meal or snack. Take breaks to reflect and have conversation.  Cleanup and Leftovers: Label leftovers for a specific meal or snack. Freeze or refrigerate individual portions of leftovers. Do not clean up if you are still hungry.  Eating Out and Social Eating  Do not arrive hungry. Eat something light before the meal. Try to fill up on low-calorie foods, such as  vegetables and fruit, and eat smaller portions of the high-calorie foods. Eat foods that you like, but choose small portions. If you want seconds, wait at least 20 minutes after you have eaten to see if you are actually hungry or if your eyes are bigger than your stomach. Limit alcoholic beverages. Try a soda water with a twist of lime. Do not skip other meals in the day to save room for the special event.  At Restaurants: Order  la carte rather than buffet style. Order some  vegetables or a salad for an appetizer instead of eating bread. If you order a high-calorie dish, share it with someone. Try an after-dinner mint with your coffee. If you do have dessert, share it with two or more people. Don't overeat because you do not want to waste food. Ask for a doggie bag to take extra food home. Tell the server to put half of your entree in a to go bag before the meal is served to you. Ask for salad dressing, gravy or high-fat sauces on the side. Dip the tip of your fork in the dressing before each bite. If bread is served, ask for only one piece. Try it plain without butter or oil. At [employer] where oil and vinegar is served with bread, use only a small amount of oil and a lot of vinegar for dipping.  At a [facility]: Offer to bring a dish, appetizer or dessert that is low in calories. Serve yourself small portions or tell the host that you only want a small amount. Stand or sit away from the snack table. Stay away from the kitchen or stay busy if you are near the food. Limit your alcohol intake.  At [employer] and Cafeterias: Cover most of your plate with lettuce and/or vegetables. Use a salad plate instead of a dinner plate. After eating, clear away your dishes before having coffee or tea.  Entertaining at Home: Explore low-fat, low-cholesterol cookbooks. Use single-serving foods like chicken breasts or hamburger patties. Prepare low-calorie appetizers and  desserts.   Holidays: Keep tempting foods out of sight. Decorate the house without using food. Have low-calorie beverages and foods on hand for guests. Allow yourself one planned treat a day. Don't skip meals to save up for the holiday feast. Eat regular, planned meals.   Exercise Well  Make exercise a priority and a planned activity in the day. If possible, walk the entire or part of the distance to work. Get an exercise buddy. Go for a walk with a colleague during one of your breaks, go to the gym, run or take a walk with a friend, walk in the mall with a shopping companion. Park at the end of the parking lot and walk to the store or office entrance. Always take the stairs all of the way or at least part of the way to your floor. If you have a desk job, walk around the office frequently. Do leg lifts while sitting at your desk. Do something outside on the weekends like going for a hike or a bike ride.   Have a Healthy Attitude  Make health your weight management priority. Be realistic. Have a goal to achieve a healthier you, not necessarily the lowest weight or ideal weight based on calculations or tables. Focus on a healthy eating style, not on dieting. Dieting usually lasts for a short amount of time and rarely produces long-term success. Think long term. You are developing new healthy behaviors to follow next month, in a year and in a decade.    This information is for educational purposes only and is not intended to replace the advice of your doctor or health care provider. We encourage you to discuss with your doctor any questions or concerns you may have.        Guidelines for Losing Weight   We want weight loss that will last so you should lose 1-2 pounds a week.  THAT IS IT! Please pick THREE things a month to change. Once it is  a habit check off the item. Then pick another three items off the list to become habits.  If you are already doing a habit on the list  GREAT!  Cross that item off!  Don't drink your calories. Ie, alcohol, soda, fruit juice, and sweet tea.   Drink more water. Drink a glass when you feel hungry or before each meal.   Eat breakfast - Complex carb and protein (likeDannon light and fit yogurt, oatmeal, fruit, eggs, [place] bacon).  Measure your cereal.  Eat no more than one cup a day. (ie Kashi)  Eat an apple a day.  Add a vegetable a day.  Try a new vegetable a month.  Use Pam! Stop using oil or butter to cook.  Don't finish your plate or use smaller plates.  Share your dessert.  Eat sugar free Jello for dessert or frozen grapes.  Don't eat 2-3 hours before bed.  Switch to whole wheat bread, pasta, and brown rice.  Make healthier choices when you eat out. No fries!  Pick baked chicken, NOT fried.  Don't forget to SLOW DOWN when you eat. It is not going anywhere.   Take the stairs.  Park far away in the parking lot  Lift soup cans (or weights) for 10 minutes while watching TV.  Walk at work for 10 minutes during break.  Walk outside 1 time a week with your friend, kids, dog, or significant other.  Start a walking group at church.  Walk the mall as much as you can tolerate.   Keep a food diary.  Weigh yourself daily.  Walk for 15 minutes 3 days per week.  Cook at home more often and eat out less. If life happens and you go back to old habits, it is okay.  Just start over. You can do it!  If you experience chest pain, get short of breath, or tired during the exercise, please stop immediately and inform your doctor.    Before you even begin to attack a weight-loss plan, it pays to remember this: You are not fat. You have fat. Losing weight isn't about blame or shame; it's simply another achievement to accomplish. Dieting is like any other skill--you have to buckle down and work at it. As long as you act in a smart, reasonable way, you'll ultimately get where you want to be. Here are some weight  loss pearls for you.   1. It's Not a Diet. It's a Lifestyle Thinking of a diet as something you're on and suffering through only for the short term doesn't work. To shed weight and keep it off, you need to make permanent changes to the way you eat. It's OK to indulge occasionally, of course, but if you cut calories temporarily and then revert to your old way of eating, you'll gain back the weight quicker than you can say yo-yo. Use it to lose it. Research shows that one of the best predictors of long-term weight loss is how many pounds you drop in the first month. For that reason, nutritionists often suggest being stricter for the first two weeks of your new eating strategy to build momentum. Cut out added sugar and alcohol and avoid unrefined carbs. After that, figure out how you can reincorporate them in a way that's healthy and maintainable.  2. There's a Right Way to Exercise Working out burns calories and fat and boosts your metabolism by building muscle. But those trying to lose weight are notorious for overestimating the number of  calories they burn and underestimating the amount they take in. Unfortunately, your system is biologically programmed to hold on to extra pounds and that means when you start exercising, your body senses the deficit and ramps up its hunger signals. If you're not diligent, you'll eat everything you burn and then some. Use it, to lose it. Cardio gets all the exercise glory, but strength and interval training are the real heroes. They help you build lean muscle, which in turn increases your metabolism and calorie-burning ability 3. Don't Overreact to Mild Hunger Some people have a hard time losing weight because of hunger anxiety. To them, being hungry is bad--something to be avoided at all costs--so they carry snacks with them and eat when they don't need to. Others eat because they're stressed out or bored. While you never want to get to the point of being ravenous (that's  when bingeing is likely to happen), a hunger pang, a craving, or the fact that it's 3:00 p.m. should not send you racing for the vending machine or obsessing about the energy bar in your purse. Ideally, you should put off eating until your stomach is growling and it's difficult to concentrate.  Use it to lose it. When you feel the urge to eat, use the HALT method. Ask yourself, Am I really hungry? Or am I angry or anxious, lonely or bored, or tired? If you're still not certain, try the apple test. If you're truly hungry, an apple should seem delicious; if it doesn't, something else is going on. Or you can try drinking water and making yourself busy, if you are still hungry try a healthy snack.  4. Not All Calories Are Created Equal The mechanics of weight loss are pretty simple: Take in fewer calories than you use for energy. But the kind of food you eat makes all the difference. Processed food that's high in saturated fat and refined starch or sugar can cause inflammation that disrupts the hormone signals that tell your brain you're full. The result: You eat a lot more.  Use it to lose it. Clean up your diet. Swap in whole, unprocessed foods, including vegetables, lean protein, and healthy fats that will fill you up and give you the biggest nutritional bang for your calorie buck. In a few weeks, as your brain starts receiving regular hunger and fullness signals once again, you'll notice that you feel less hungry overall and naturally start cutting back on the amount you eat.  5. Protein, Produce, and Plant-Based Fats Are Your Weight-Loss Trinity Here's why eating the three Ps regularly will help you drop pounds. Protein fills you up. You need it to build lean muscle, which keeps your metabolism humming so that you can torch more fat. People in a weight-loss program who ate double the recommended daily allowance for protein (about 110 grams for a 150-pound woman) lost 70 percent of their weight from fat,  while people who ate the RDA lost only about 40 percent, one study found. Produce is packed with filling fiber. "It's very difficult to consume too many calories if you're eating a lot of vegetables. Example: Three cups of broccoli is a lot of food, yet only 93 calories. (Fruit is another story. It can be easy to overeat and can contain a lot of calories from sugar, so be sure to monitor your intake.) Plant-based fats like olive oil and those in avocados and nuts are healthy and extra satiating.  Use it to lose it. Aim to incorporate each  of the three Ps into every meal and snack. People who eat protein throughout the day are able to keep weight off, according to a study in the [place] of Clinical Nutrition. In addition to meat, poultry and seafood, good sources are beans, lentils, eggs, tofu, and yogurt. As for fat, keep portion sizes in check by measuring out salad dressing, oil, and nut butters (shoot for one to two tablespoons). Finally, eat veggies or a little fruit at every meal. People who did that consumed 308 fewer calories but didn't feel any hungrier than when they didn't eat more produce.  7. How You Eat Is As Important As What You Eat In order for your brain to register that you're full, you need to focus on what you're eating. Sit down whenever you eat, preferably at a table. Turn off the TV or computer, put down your phone, and look at your food. Smell it. Chew slowly, and don't put another bite on your fork until you swallow. When women ate lunch this attentively, they consumed 30 percent less when snacking later than those who listened to an audiobook at lunchtime, according to a study in the [place] of Nutrition. 8. Weighing Yourself Really Works The scale provides the best evidence about whether your efforts are paying off. Seeing the numbers tick up or down or stagnate is motivation to keep going--or to rethink your approach. A 2015 study at [facility] found that  daily weigh-ins helped people lose more weight, keep it off, and maintain that loss, even after two years. Use it to lose it. Step on the scale at the same time every day for the best results. If your weight shoots up several pounds from one weigh-in to the next, don't freak out. Eating a lot of salt the night before or having your period is the likely culprit. The number should return to normal in a day or two. It's a steady climb that you need to do something about. 9. Too Much Stress and Too Little Sleep Are Your Enemies When you're tired and frazzled, your body cranks up the production of cortisol, the stress hormone that can cause carb cravings. Not getting enough sleep also boosts your levels of ghrelin, a hormone associated with hunger, while suppressing leptin, a hormone that signals fullness and satiety. People on a diet who slept only five and a half hours a night for two weeks lost 55 percent less fat and were hungrier than those who slept eight and a half hours, according to a study in the [place]. Use it to lose it. Prioritize sleep, aiming for seven hours or more a night, which research shows helps lower stress. And make sure you're getting quality zzz's. If a snoring spouse or a fidgety cat wakes you up frequently throughout the night, you may end up getting the equivalent of just four hours of sleep, according to a study from [facility]. Keep pets out of the bedroom, and use a white-noise app to drown out snoring. 10. You Will Hit a plateau--And You Can Bust Through It As you slim down, your body releases much less leptin, the fullness hormone.  If you're not strength training, start right now. Building muscle can raise your metabolism to help you overcome a plateau. To keep your body challenged and burning calories, incorporate new moves and more intense intervals into your workouts or add another sweat session to your weekly routine. Alternatively, cut an  extra 100 calories or  so a day from your diet. Now that you've lost weight, your body simply doesn't need as much fuel.    Since food equals calories, in order to lose weight you must either eat fewer calories, exercise more to burn off calories with activity, or both. Food that is not used to fuel the body is stored as fat. A major component of losing weight is to make smarter food choices. Here's how:  1)   Limit non-nutritious foods, such as: Sugar, honey, syrups and candy Pastries, donuts, pies, cakes and cookies Soft drinks, sweetened juices and alcoholic beverages  2)  Cut down on high-fat foods by: - Choosing poultry, fish or lean red meat - Choosing low-fat cooking methods, such as baking, broiling, steaming, grilling and boiling - Using low-fat or non-fat dairy products - Using vinaigrette, herbs, lemon or fat-free salad dressings - Avoiding fatty meats, such as bacon, sausage, franks, ribs and luncheon meats - Avoiding high-fat snacks like nuts, chips and chocolate - Avoiding fried foods - Using less butter, margarine, oil and mayonnaise - Avoiding high-fat gravies, cream sauces and cream-based soups  3) Eat a variety of foods, including: - Fruit and vegetables that are raw, steamed or baked - Whole grains, breads, cereal, rice and pasta - Dairy products, such as low-fat or non-fat milk or yogurt, low-fat cottage cheese and low-fat cheese - Protein-rich foods like chicken, [place], fish, lean meat and legumes, or beans  4) Change your eating habits by: - Eat three balanced meals a day to help control your hunger - Watch portion sizes and eat small servings of a variety of foods - Choose low-calorie snacks - Eat only when you are hungry and stop when you are satisfied - Eat slowly and try not to perform other tasks while eating - Find other activities to distract you from food, such as walking, taking up a hobby or being involved in the community - Include regular exercise  in your daily routine ( minimum of 20 min of moderate-intensity exercise at least 5 days/week)  - Find a support group, if necessary, for emotional support in your weight loss journey           Easy ways to cut 100 calories   1. Eat your eggs with hot sauce OR salsa instead of cheese.  Eggs are great for breakfast, but many people consider eggs and cheese to be BFFs. Instead of cheese--1 oz. of cheddar has 114 calories--top your eggs with hot sauce, which contains no calories and helps with satiety and metabolism. Salsa is also a great option!!  2. Top your toast, waffles or pancakes with fresh berries instead of jelly or syrup. Half a cup of berries--fresh, frozen or thawed--has about 40 calories, compared with 2 tbsp. of maple syrup or jelly, which both have about 100 calories. The berries will also give you a good punch of fiber, which helps keep you full and satisfied and won't spike blood sugar quickly like the jelly or syrup. 3. Swap the non-fat latte for black coffee with a splash of half-and-half. Contrary to its name, that non-fat latte has 130 calories and a startling 19g of carbohydrates per 16 oz. serving. Replacing that 'light' drinkable dessert with a black coffee with a splash of half-and-half saves you more than 100 calories per 16 oz. serving. 4. Sprinkle salads with freeze-dried raspberries instead of dried cranberries. If you want a sweet addition to your nutritious salad, stay away from dried cranberries. They have a whopping 130  calories per  cup and 30g carbohydrates. Instead, sprinkle freeze-dried raspberries guilt-free and save more than 100 calories per  cup serving, adding 3g of belly-filling fiber. 5. Go for mustard in place of mayo on your sandwich. Mustard can add really nice flavor to any sandwich, and there are tons of varieties, from spicy to honey. A serving of mayo is 95 calories, versus 10 calories in a serving of mustard.  Or try an avocado mayo spread:  You can find the recipe few click this link: https://www.californiaavocado.com/recipes/recipe-container/[place]-avocado-mayo 6. Choose a DIY salad dressing instead of the store-bought kind. Mix Dijon or whole grain mustard with low-fat Kefir or red wine vinegar and garlic. 7. Use hummus as a spread instead of a dip. Use hummus as a spread on a high-fiber cracker or tortilla with a sandwich and save on calories without sacrificing taste. 8. Pick just one salad "accessory." Salad isn't automatically a calorie winner. It's easy to over-accessorize with toppings. Instead of topping your salad with nuts, avocado and cranberries (all three will clock in at 313 calories), just pick one. The next day, choose a different accessory, which will also keep your salad interesting. You don't wear all your jewelry every day, right? 9. Ditch the white pasta in favor of spaghetti squash. One cup of cooked spaghetti squash has about 40 calories, compared with traditional spaghetti, which comes with more than 200. Spaghetti squash is also nutrient-dense. It's a good source of fiber and Vitamins A and C, and it can be eaten just like you would eat pasta--with a great tomato sauce and [place] meatballs or with pesto, tofu and spinach, for example. 10. Dress up your chili, soups and stews with non-fat [place] yogurt instead of sour cream. Just a 'dollop' of sour cream can set you back 115 calories and a whopping 12g of fat--seven of which are of the artery-clogging variety. Added bonus: [place] yogurt is packed with muscle-building protein, calcium and B Vitamins. 11. Mash cauliflower instead of mashed potatoes. One cup of traditional mashed potatoes--in all their creamy goodness--has more than 200 calories, compared to mashed cauliflower, which you can typically eat for less than 100 calories per 1 cup serving. Cauliflower is a great source of the antioxidant indole-3-carbinol (I3C), which may help reduce the risk of some  cancers, like breast cancer. 12. Ditch the ice cream sundae in favor of a [place] yogurt parfait. Instead of a cup of ice cream or fro-yo for dessert, try 1 cup of nonfat Greek yogurt topped with fresh berries and a sprinkle of cacao nibs. Both toppings are packed with antioxidants, which can help reduce cellular inflammation and oxidative damage. And the comparison is a no-brainer: One cup of ice cream has about 275 calories; one cup of frozen yogurt has about 230; and a cup of Greek yogurt has just 130, plus twice the protein, so you're less likely to return to the freezer for a second helping. 13. Put olive oil in a spray container instead of using it directly from the bottle. Each tablespoon of olive oil is 120 calories and 15g of fat. Use a mister instead of pouring it straight into the pan or onto a salad. This allows for portion control and will save you more than 100 calories. 14. When baking, substitute canned pumpkin for butter or oil. Canned pumpkin--not pumpkin pie mix--is loaded with Vitamin A, which is important for skin and eye health, as well as immunity. And the comparisons are pretty crazy:  cup of canned pumpkin  has about 40 calories, compared to butter or oil, which has more than 800 calories. Yes, 800 calories. Applesauce and mashed banana can also serve as good substitutions for butter or oil, usually in a 1:1 ratio. 15. Top casseroles with high-fiber cereal instead of breadcrumbs. Breadcrumbs are typically made with white bread, while breakfast cereals contain 5-9g of fiber per serving. Not only will you save more than 150 calories per  cup serving, the swap will also keep you more full and you'll get a metabolism boost from the added fiber. 16. Snack on pistachios instead of macadamia nuts. Believe it or not, you get the same amount of calories from 35 pistachios (100 calories) as you would from only five macadamia nuts. 17. Chow down on kale chips rather than potato chips. This  is my favorite 'don't knock it 'till you try it' swap. Kale chips are so easy to make at home, and you can spice them up with a little grated parmesan or chili powder. Plus, they're a mere fraction of the calories of potato chips, but with the same crunch factor we crave so often. 18. Add seltzer and some fruit slices to your cocktail instead of soda or fruit juice. One cup of soda or fruit juice can pack on as much as 140 calories. Instead, use seltzer and fruit slices. The fruit provides valuable phytochemicals, such as flavonoids and anthocyanins, which help to combat cancer and stave off the aging process.

## 2019-04-13 ENCOUNTER — Other Ambulatory Visit: Payer: Self-pay | Admitting: Family Medicine

## 2019-04-13 ENCOUNTER — Encounter: Payer: Self-pay | Admitting: Registered"

## 2019-04-13 DIAGNOSIS — R351 Nocturia: Secondary | ICD-10-CM

## 2019-04-13 NOTE — Progress Notes (Signed)
On 04/07/19 pt completed a post core session of the Diabetes Prevention Program course virtually with [facility]. By the end of this session patients are able to complete the following objectives:   Learning Objectives:  Counter self-defeating thoughts with positive self-statements  Define assertiveness.   List examples of ways to practice assertiveness.   Goals:   Record weight taken outside of class.   Track foods and beverages eaten each day in the "Food and Activity Tracker," including calories and fat grams for each item.    Track activity type, minutes you were active, and distance you reached each day in the "Food and Activity Tracker."   Follow-Up Plan:  Attend next session.   Email completed "Food and Activity Trackers" before next session to be reviewed by Lifestyle Coach.

## 2019-04-14 ENCOUNTER — Encounter (HOSPITAL_BASED_OUTPATIENT_CLINIC_OR_DEPARTMENT_OTHER): Payer: Medicare Other | Admitting: Registered"

## 2019-04-14 DIAGNOSIS — R7303 Prediabetes: Secondary | ICD-10-CM

## 2019-04-18 ENCOUNTER — Other Ambulatory Visit: Payer: Self-pay | Admitting: Family Medicine

## 2019-04-18 DIAGNOSIS — E785 Hyperlipidemia, unspecified: Secondary | ICD-10-CM

## 2019-04-20 ENCOUNTER — Encounter: Payer: Self-pay | Admitting: Registered"

## 2019-04-20 NOTE — Progress Notes (Signed)
On 04/14/19 pt completed a Session of the Diabetes Prevention Program course virtually with [facility]. By the end of this session patients are able to complete the following objectives:   Learning Objectives:  Identify how to maintain and/or continue working toward program goals for the remainder of the program.   Describe ways that food and activity tracking can assist them in maintaining/reaching program goals.   Identify progress they have made since the beginning of the program.   Goals:   Record weight taken outside of class.   Track foods and beverages eaten each day in the "Food and Activity Tracker," including calories and fat grams for each item.    Track activity type, minutes you were active, and distance you reached each day in the "Food and Activity Tracker."   Follow-Up Plan:  Attend next session.   Email completed "Food and Activity Trackers" next session to be reviewed by Lifestyle Coach.

## 2019-04-21 ENCOUNTER — Encounter (HOSPITAL_BASED_OUTPATIENT_CLINIC_OR_DEPARTMENT_OTHER): Payer: Medicare Other | Admitting: Registered"

## 2019-04-21 ENCOUNTER — Encounter: Payer: Self-pay | Admitting: Registered"

## 2019-04-21 DIAGNOSIS — R7303 Prediabetes: Secondary | ICD-10-CM

## 2019-04-21 NOTE — Progress Notes (Signed)
On 04/21/19 pt completed a session of the Diabetes Prevention Program course virtually with [facility]. By the end of this session patients are able to complete the following objectives:   Learning Objectives:  List risk factors for heart disease.   Define the difference between HDL and LDL cholesterol  List ways to reduce risk for heart disease.   Goals:   Record weight taken outside of class.   Track foods and beverages eaten each day in the "Food and Activity Tracker," including calories and fat grams for each item.    Track activity type, minutes you were active, and distance you reached each day in the "Food and Activity Tracker."   Follow-Up Plan:  Attend next session.   Email completed "Food and Activity Trackers" before next session to be reviewed by Lifestyle Coach.

## 2019-04-25 ENCOUNTER — Telehealth: Payer: Self-pay | Admitting: Family Medicine

## 2019-04-25 DIAGNOSIS — M1711 Unilateral primary osteoarthritis, right knee: Secondary | ICD-10-CM | POA: Diagnosis not present

## 2019-04-25 NOTE — Telephone Encounter (Signed)
Called patient to see what meds she is requesting a tier reduction on. Patient did not answer and unable to leave voicemail due to patient vm being full. AS, CMA

## 2019-04-25 NOTE — Telephone Encounter (Signed)
Patient called states her [employer] replacement rep @ [phone] told her she could be getting All her medicines cheaper if her PCP wrote a letter to them requesting a (Tier 1 reduction)--per patient if there are any question they suggested that we call them @ [phone].  --Forwarding request to med asst if any questions pls call patient @ [phone]  --glh

## 2019-04-27 ENCOUNTER — Other Ambulatory Visit: Payer: Self-pay

## 2019-04-27 ENCOUNTER — Encounter (INDEPENDENT_AMBULATORY_CARE_PROVIDER_SITE_OTHER): Payer: Self-pay | Admitting: Family Medicine

## 2019-04-27 ENCOUNTER — Ambulatory Visit (INDEPENDENT_AMBULATORY_CARE_PROVIDER_SITE_OTHER): Payer: Medicare Other | Admitting: Family Medicine

## 2019-04-27 VITALS — BP 146/79 | HR 56 | Temp 97.9°F | Ht 65.0 in | Wt 196.0 lb

## 2019-04-27 DIAGNOSIS — G8929 Other chronic pain: Secondary | ICD-10-CM

## 2019-04-27 DIAGNOSIS — R0602 Shortness of breath: Secondary | ICD-10-CM

## 2019-04-27 DIAGNOSIS — R7303 Prediabetes: Secondary | ICD-10-CM | POA: Diagnosis not present

## 2019-04-27 DIAGNOSIS — E669 Obesity, unspecified: Secondary | ICD-10-CM

## 2019-04-27 DIAGNOSIS — Z6832 Body mass index (BMI) 32.0-32.9, adult: Secondary | ICD-10-CM

## 2019-04-27 DIAGNOSIS — Z0289 Encounter for other administrative examinations: Secondary | ICD-10-CM

## 2019-04-27 DIAGNOSIS — E7849 Other hyperlipidemia: Secondary | ICD-10-CM

## 2019-04-27 DIAGNOSIS — K5900 Constipation, unspecified: Secondary | ICD-10-CM

## 2019-04-27 DIAGNOSIS — M25561 Pain in right knee: Secondary | ICD-10-CM

## 2019-04-27 DIAGNOSIS — Z1331 Encounter for screening for depression: Secondary | ICD-10-CM | POA: Diagnosis not present

## 2019-04-27 DIAGNOSIS — R5383 Other fatigue: Secondary | ICD-10-CM

## 2019-04-27 DIAGNOSIS — Z9189 Other specified personal risk factors, not elsewhere classified: Secondary | ICD-10-CM | POA: Diagnosis not present

## 2019-04-27 DIAGNOSIS — I10 Essential (primary) hypertension: Secondary | ICD-10-CM | POA: Diagnosis not present

## 2019-04-27 DIAGNOSIS — E559 Vitamin D deficiency, unspecified: Secondary | ICD-10-CM

## 2019-04-27 NOTE — Progress Notes (Signed)
Dear Dr. Mellody [name],   Thank you for referring Bailey [name] to our clinic. The following note includes my evaluation and treatment recommendations.  Chief Complaint:   OBESITY Bailey [name] (MR# [id number]) is a 69 y.o. female who presents for evaluation and treatment of obesity and related comorbidities. Current BMI is Body mass index is 32.62 kg/m. Bailey [name] has been struggling with her weight for many years and has been unsuccessful in either losing weight, maintaining weight loss, or reaching her healthy weight goal.  Bailey [name] is currently in the action stage of change and ready to dedicate time achieving and maintaining a healthier weight. Bailey [name] is interested in becoming our patient and working on intensive lifestyle modifications including (but not limited to) diet and exercise for weight loss.  Bailey [name]'s habits were reviewed today and are as follows: Her family eats meals together, she thinks her family will eat healthier with her, she struggles with family and or coworkers weight loss sabotage, her desired weight loss is 50-60 pounds, she has been heavy most of her life, she started gaining weight after childbirth, her heaviest weight ever was 206 pounds, she craves milk with anything chocolate, she is frequently drinking liquids with calories and she struggles with emotional eating.  Depression Screen Bailey [name]'s Food and Mood (modified PHQ-9) score was 1.  Depression screen Bailey [name] 2/9 04/27/2019  Decreased Interest 0  Down, Depressed, Hopeless 0  PHQ - 2 Score 0  Altered sleeping 0  Tired, decreased energy 1  Change in appetite 0  Feeling bad or failure about yourself  0  Trouble concentrating 0  Moving slowly or fidgety/restless 0  Suicidal thoughts 0  PHQ-9 Score 1  Difficult doing work/chores Not difficult at all  Some recent data might be hidden   Subjective:   1. Other fatigue Bailey [name] denies daytime somnolence and denies waking up still tired.  Bailey [name] generally  gets 9 hours of sleep per night, and states that she has generally restful sleep. Snoring is not present. Apneic episodes are not present. Epworth Sleepiness Score is 1.  2. SOB (shortness of breath) on exertion Bailey [name] notes increasing shortness of breath with exercising and seems to be worsening over time with weight gain. She notes getting out of breath sooner with activity than she used to. This has not gotten worse recently. Bailey [name] denies shortness of breath at rest or orthopnea.  3. Prediabetes Bailey [name] has a diagnosis of prediabetes based on her elevated HgA1c and was informed this puts her at greater risk of developing diabetes. She continues to work on diet and exercise to decrease her risk of diabetes. She denies nausea or hypoglycemia.  Lab Results  Component Value Date   HGBA1C 5.9 (H) 02/13/2019   4. Essential hypertension Review: taking medications as instructed, no medication side effects noted, no chest pain on exertion, no dyspnea on exertion, no swelling of ankles.   BP Readings from Last 3 Encounters:  04/27/19 (!) 146/79  04/06/19 121/76  02/15/19 137/84   5. Other hyperlipidemia Bailey [name] has hyperlipidemia and has been trying to improve her cholesterol levels with intensive lifestyle modification including a low saturated fat diet, exercise and weight loss. She denies any chest pain, claudication or myalgias.  Lab Results  Component Value Date   ALT 16 04/04/2019   AST 19 04/04/2019   ALKPHOS 68 04/04/2019   BILITOT 0.6 04/04/2019   Lab Results  Component Value Date   CHOL 154 04/04/2019   HDL 53 04/04/2019  LDLCALC 84 04/04/2019   LDLDIRECT 177.1 09/20/2012   TRIG 90 04/04/2019   CHOLHDL 2.9 04/04/2019   6. Vitamin D deficiency Bailey [name]'s Vitamin D level was 49.6 on 08/02/2018. She is currently taking vit D. She denies nausea, vomiting or muscle weakness.  7. Chronic pain of right knee Bailey [name] needs a right total knee replacement.  She sees Bailey [name].  8.  Constipation, unspecified constipation type Bailey [name] notes constipation, has a BM about every 3 days.  9. Serum calcium elevated Bailey [name] takes 4 Tums about 3 times per month.   Lab Results  Component Value Date   CALCIUM 10.4 (H) 04/04/2019   Assessment/Plan:   1. Other fatigue Bailey [name] does not feel that her weight is causing her energy to be lower than it should be. Fatigue may be related to obesity, depression or many other causes. Labs will be ordered, and in the meanwhile, Bailey [name] will focus on self care including making healthy food choices, increasing physical activity and focusing on stress reduction.  Orders - EKG 12-Lead  2. SOB (shortness of breath) on exertion Bailey [name] does feel that she gets out of breath more easily that she used to when she exercises. Bailey [name] shortness of breath appears to be obesity related and exercise induced. She has agreed to work on weight loss and gradually increase exercise to treat her exercise induced shortness of breath. Will continue to monitor closely.  3. Prediabetes Bailey [name] will continue to work on weight loss, exercise, and decreasing simple carbohydrates to help decrease the risk of diabetes.   4. Essential hypertension Bailey [name] is working on healthy weight loss and exercise to improve blood pressure control. We will watch for signs of hypotension as she continues her lifestyle modifications.  5. Other hyperlipidemia Cardiovascular risk and specific lipid/LDL goals reviewed.  We discussed several lifestyle modifications today and Bailey [name] will continue to work on diet, exercise and weight loss efforts. Orders and follow up as documented in patient record.   Counseling Intensive lifestyle modifications are the first line treatment for this issue. . Dietary changes: Increase soluble fiber. Decrease simple carbohydrates. . Exercise changes: Moderate to vigorous-intensity aerobic activity 150 minutes per week if tolerated. . Lipid-lowering  medications: see documented in medical record.  6. Vitamin D deficiency Low Vitamin D level contributes to fatigue and are associated with obesity, breast, and colon cancer. She agrees to continue to take prescription Vitamin D @50 ,000 IU every week and will follow-up for routine testing of Vitamin D, at least 2-3 times per year to avoid over-replacement.  7. Chronic pain of right knee Will follow because mobility and pain control are important for weight management.  8. Constipation Bailey [name] was informed that a decrease in bowel movement frequency is normal while losing weight, but stools should not be hard or painful. Orders and follow up as documented in patient record.   Counseling Getting to Good Bowel Health: Your goal is to have one soft bowel movement each day. Drink at least 8 glasses of water each day. Eat plenty of fiber (goal is over 25 grams each day). It is best to get most of your fiber from dietary sources which includes leafy green vegetables, fresh fruit, and whole grains. You may need to add fiber with the help of OTC fiber supplements. These include Metamucil, Citrucel, and Flaxseed. If you are still having trouble, try adding Miralax or Magnesium Citrate. If all of these changes do not work, Cabin crew.  Addressing this patient's constipation, may help decrease her  nightly urinary incontinence as well.  9. Serum calcium elevated Will monitor. I instructed her to stop using Tums. Her abdominal discomfort seems to be related to constipation. We reviewed a bowel regimen.  10. Depression screening Depression screen was negative today.  PHQ was 1.  11. At risk for diabetes mellitus Bailey [name] was given approximately 15 minutes of diabetes education and counseling today. We discussed intensive lifestyle modifications today with an emphasis on weight loss as well as increasing exercise and decreasing simple carbohydrates in her diet. We also reviewed medication options with  an emphasis on risk versus benefit of those discussed.   Repetitive spaced learning was employed today to elicit superior memory formation and behavioral change.  12. Class 1 obesity with serious comorbidity and body mass index (BMI) of 32.0 to 32.9 in adult, unspecified obesity type Bailey [name] is currently in the action stage of change and her goal is to continue with weight loss efforts. I recommend Bailey [name] begin the structured treatment plan as follows:  She has agreed to the Category 2 Plan.  Exercise goals: No exercise has been prescribed at this time.   Behavioral modification strategies: increasing lean protein intake, decreasing simple carbohydrates, increasing vegetables, increasing water intake, decreasing liquid calories and decreasing sodium intake.  She was informed of the importance of frequent follow-up visits to maximize her success with intensive lifestyle modifications for her multiple health conditions. She was informed we would discuss her lab results at her next visit unless there is a critical issue that needs to be addressed sooner. Bailey [name] agreed to keep her next visit at the agreed upon time to discuss these results.  Objective:   Blood pressure (!) 146/79, pulse (!) 56, temperature 97.9 F (36.6 C), temperature source Oral, height 5\' 5"  (1.651 m), weight 196 lb (88.9 kg), SpO2 98 %. Body mass index is 32.62 kg/m.  EKG: Normal sinus rhythm, rate 63 bpm.  Indirect Calorimeter completed today shows a VO2 of 230 and a REE of 1607.  Her calculated basal metabolic rate is [id number] thus her basal metabolic rate is better than expected.  General: Cooperative, alert, well developed, in no acute distress. HEENT: Conjunctivae and lids unremarkable. Cardiovascular: Regular rhythm.  Lungs: Normal work of breathing. Neurologic: No focal deficits.   Lab Results  Component Value Date   CREATININE 0.66 04/04/2019   BUN 16 04/04/2019   NA 141 04/04/2019   K 3.8 04/04/2019   CL 102  04/04/2019   CO2 25 04/04/2019   Lab Results  Component Value Date   ALT 16 04/04/2019   AST 19 04/04/2019   ALKPHOS 68 04/04/2019   BILITOT 0.6 04/04/2019   Lab Results  Component Value Date   HGBA1C 5.9 (H) 02/13/2019   HGBA1C 5.8 (H) 08/02/2018   HGBA1C 5.4 12/17/2016   Lab Results  Component Value Date   TSH 2.500 08/02/2018   Lab Results  Component Value Date   CHOL 154 04/04/2019   HDL 53 04/04/2019   LDLCALC 84 04/04/2019   LDLDIRECT 177.1 09/20/2012   TRIG 90 04/04/2019   CHOLHDL 2.9 04/04/2019   Lab Results  Component Value Date   WBC 6.6 08/02/2018   HGB 14.6 08/02/2018   HCT 39.2 08/02/2018   MCV 91 08/02/2018   PLT 245 08/02/2018   Attestation Statements:   This is the patient's first visit at [facility]. The patient's NEW PATIENT PACKET was reviewed at length. Included in the packet: current and past health history, medications, allergies,  ROS, gynecologic history (women only), surgical history, family history, social history, weight history, weight loss surgery history (for those that have had weight loss surgery), nutritional evaluation, mood and food questionnaire, PHQ9, Epworth questionnaire, sleep habits questionnaire, patient life and health improvement goals questionnaire. These will all be scanned into the patient's chart under media.   During the visit, I independently reviewed the patient's EKG, bioimpedance scale results, and indirect calorimeter results. I used this information to tailor a meal plan for the patient that will help her to lose weight and will improve her obesity-related conditions going forward. I performed a medically necessary appropriate examination and/or evaluation. I discussed the assessment and treatment plan with the patient. The patient was provided an opportunity to ask questions and all were answered. The patient agreed with the plan and demonstrated an understanding of the instructions. Labs were ordered at  this visit and will be reviewed at the next visit unless more critical results need to be addressed immediately. Clinical information was updated and documented in the EMR.   I, Water quality scientist, CMA, am acting as Location manager for [employer], DO.  I have reviewed the above documentation for accuracy and completeness, and I agree with the above. Briscoe Deutscher, DO

## 2019-05-02 ENCOUNTER — Telehealth: Payer: Self-pay | Admitting: Family Medicine

## 2019-05-02 DIAGNOSIS — M1711 Unilateral primary osteoarthritis, right knee: Secondary | ICD-10-CM | POA: Diagnosis not present

## 2019-05-02 DIAGNOSIS — M25561 Pain in right knee: Secondary | ICD-10-CM | POA: Diagnosis not present

## 2019-05-02 DIAGNOSIS — M6281 Muscle weakness (generalized): Secondary | ICD-10-CM | POA: Diagnosis not present

## 2019-05-02 NOTE — Telephone Encounter (Signed)
Thank you for letting us know.    Please tell patient that this could be from starting the Crestor however, I recommend she does not stop it but take the half a tablet 2 days weekly such as Tuesdays and Fridays only not daily.  This should significantly decrease chances of side effect and current symptoms  Additionally, I recommend she drink one half of her weight in ounces of water per day or close to it, and additionally walking daily, which will significantly decrease her chances of side effects and current symptoms of cramps.

## 2019-05-02 NOTE — Telephone Encounter (Signed)
Patient states that she is having cramps in her legs and she thinks this is in relation to the Crestor that she is taking. Patient currently taking 5mg  1/2 tab daily.   Patient states that this has been happening for 2 weeks. AS, CMA

## 2019-05-02 NOTE — Telephone Encounter (Signed)
When I spoke with patient this morning she states she was driving and had errands to run and would call our office back in regards to this message and another message concerning tier reduction for medication. AS, CMA

## 2019-05-03 NOTE — Telephone Encounter (Signed)
Patient is aware of the below and verbalized understanding.    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

## 2019-05-03 NOTE — Telephone Encounter (Signed)
Called patient and she said she is unable to give me the number at this time because she is at [facility] and that she will call back when she is able to get the information I need. AS, CMA

## 2019-05-04 NOTE — Telephone Encounter (Signed)
[id number] Exception Application on maintenance meds.

## 2019-05-04 NOTE — Telephone Encounter (Signed)
Patient called office this morning while I was with a patient. Returned call and left message for her to return my call. AS, CMA

## 2019-05-05 ENCOUNTER — Encounter: Payer: Medicare Other | Attending: Family Medicine

## 2019-05-05 DIAGNOSIS — R7303 Prediabetes: Secondary | ICD-10-CM | POA: Insufficient documentation

## 2019-05-09 NOTE — Telephone Encounter (Signed)
Tier reductions submitted on [employer] on 05/09/2019. AS, CMA

## 2019-05-11 ENCOUNTER — Other Ambulatory Visit: Payer: Self-pay

## 2019-05-11 ENCOUNTER — Ambulatory Visit (INDEPENDENT_AMBULATORY_CARE_PROVIDER_SITE_OTHER): Payer: Medicare Other | Admitting: Family Medicine

## 2019-05-11 ENCOUNTER — Encounter (INDEPENDENT_AMBULATORY_CARE_PROVIDER_SITE_OTHER): Payer: Self-pay | Admitting: Family Medicine

## 2019-05-11 ENCOUNTER — Telehealth: Payer: Self-pay

## 2019-05-11 VITALS — BP 117/78 | HR 60 | Temp 98.2°F | Ht 65.0 in | Wt 193.0 lb

## 2019-05-11 DIAGNOSIS — M1711 Unilateral primary osteoarthritis, right knee: Secondary | ICD-10-CM | POA: Diagnosis not present

## 2019-05-11 DIAGNOSIS — R7309 Other abnormal glucose: Secondary | ICD-10-CM | POA: Diagnosis not present

## 2019-05-11 DIAGNOSIS — R7303 Prediabetes: Secondary | ICD-10-CM

## 2019-05-11 DIAGNOSIS — E559 Vitamin D deficiency, unspecified: Secondary | ICD-10-CM

## 2019-05-11 DIAGNOSIS — R252 Cramp and spasm: Secondary | ICD-10-CM | POA: Diagnosis not present

## 2019-05-11 DIAGNOSIS — Z6832 Body mass index (BMI) 32.0-32.9, adult: Secondary | ICD-10-CM

## 2019-05-11 DIAGNOSIS — K5904 Chronic idiopathic constipation: Secondary | ICD-10-CM

## 2019-05-11 DIAGNOSIS — E7849 Other hyperlipidemia: Secondary | ICD-10-CM

## 2019-05-11 DIAGNOSIS — N39498 Other specified urinary incontinence: Secondary | ICD-10-CM

## 2019-05-11 DIAGNOSIS — M6281 Muscle weakness (generalized): Secondary | ICD-10-CM | POA: Diagnosis not present

## 2019-05-11 DIAGNOSIS — E66811 Obesity, class 1: Secondary | ICD-10-CM

## 2019-05-11 DIAGNOSIS — I10 Essential (primary) hypertension: Secondary | ICD-10-CM

## 2019-05-11 DIAGNOSIS — M25561 Pain in right knee: Secondary | ICD-10-CM | POA: Diagnosis not present

## 2019-05-11 DIAGNOSIS — E669 Obesity, unspecified: Secondary | ICD-10-CM | POA: Diagnosis not present

## 2019-05-11 DIAGNOSIS — G8929 Other chronic pain: Secondary | ICD-10-CM

## 2019-05-11 DIAGNOSIS — R300 Dysuria: Secondary | ICD-10-CM | POA: Diagnosis not present

## 2019-05-11 NOTE — Telephone Encounter (Signed)
Patient is aware of the below and verbalized understanding. AS, CMA 

## 2019-05-11 NOTE — Telephone Encounter (Signed)
Received fax from [place] denying tier exception for ezetimibe and olmesartan/amlodipine/hctz combination therapy.  LVM for pt to return call to discuss.  Charyl Bigger, CMA

## 2019-05-11 NOTE — Progress Notes (Signed)
Chief Complaint:   OBESITY Bailey [name] is here to discuss her progress with her obesity treatment plan along with follow-up of her obesity related diagnoses. Bailey [name] is on the Category 2 Plan and states she is following her eating plan approximately 60-70% of the time. Bailey [name] states she is exercising for 0 minutes 0 times per week.  Today's visit was #: 2 Starting weight: 196 lbs Starting date: 04/27/2019 Today's weight: 193 lbs Today's date: 05/11/2019 Total lbs lost to date: 3 lbs Total lbs lost since last in-office visit: 3 lbs  Interim History:   Bailey [name] provided the following food recall today: Breakfast:  Sauteed onion, spinach, egg, and cheese Lunch:  Salads Dinner:  With husband "Snack sheet is awesome!"  Bailey [name] continues to have lower extremity cramping at night. She DC'd her statin 5 days ago but has not found relief.  Miralax daily has helped her to have daily BMs. Unfortunately, she is still getting up a few times per night to urinate and having accidents. She wants to know if she may stop the extra HCTZ, stating that it was started by her Urologist hoping that she would diurese during the day.  Subjective:   1. Prediabetes Bailey [name] has a diagnosis of prediabetes based on her elevated HgA1c and was informed this puts her at greater risk of developing diabetes. She continues to work on diet and exercise to decrease her risk of diabetes. She denies nausea or hypoglycemia.  Lab Results  Component Value Date   HGBA1C 5.9 (H) 02/13/2019   2. Vitamin D deficiency Bailey [name]'s Vitamin D level was 49.6 on 08/02/2018. She is currently taking vit D. She denies nausea, vomiting or muscle weakness.  3. Chronic pain of right knee Bailey [name] has chronic pain in her right knee. She is followed by Orthopedics.  4. Essential hypertension Review: taking medications as instructed, no medication side effects noted, no chest pain on exertion, no dyspnea on exertion, no swelling of ankles.   BP  Readings from Last 3 Encounters:  05/11/19 117/78  04/27/19 (!) 146/79  04/06/19 121/76   5. Chronic idiopathic constipation Bailey [name] has chronic constipation and says the MiraLAX is helping.  6. Other hyperlipidemia Bailey [name] has hyperlipidemia and has been trying to improve her cholesterol levels with intensive lifestyle modification including a low saturated fat diet, exercise and weight loss. She denies any chest pain, claudication or myalgias.  Lab Results  Component Value Date   ALT 16 04/04/2019   AST 19 04/04/2019   ALKPHOS 68 04/04/2019   BILITOT 0.6 04/04/2019   Lab Results  Component Value Date   CHOL 154 04/04/2019   HDL 53 04/04/2019   LDLCALC 84 04/04/2019   LDLDIRECT 177.1 09/20/2012   TRIG 90 04/04/2019   CHOLHDL 2.9 04/04/2019   7. Cramps of lower extremity Bailey [name] has been having some cramping in her lower extremities. She only has this at night. She described burning feet at the last visit, but now says the pain is muscle spasms when she points her toes.   8. Other urinary incontinence She has been suffering from some urinary incontinence. She has been seen by Urology in the past. Hx of urethral (?) dilation per patient. Controlling her constipation has not helped with this, nor has the extra HCTZ during the day.  Assessment/Plan:   1. Prediabetes Bailey [name] will continue to work on weight loss, exercise, and decreasing simple carbohydrates to help decrease the risk of diabetes.   2. Vitamin D deficiency Low Vitamin D  level contributes to fatigue and are associated with obesity, breast, and colon cancer. She agrees to continue to take prescription Vitamin D @50 ,000 IU every week and will follow-up for routine testing of Vitamin D, at least 2-3 times per year to avoid over-replacement.  3. Chronic pain of right knee Will follow because mobility and pain control are important for weight management.  4. Essential hypertension Bailey [name] is working on healthy weight loss  and exercise to improve blood pressure control. We will watch for signs of hypotension as she continues her lifestyle modifications.  Bailey [name] will stop the extra HCTZ for now. Labs to be checked include: CMP, mag.  5. Chronic idiopathic constipation Bailey [name] was informed that a decrease in bowel movement frequency is normal while losing weight, but stools should not be hard or painful. Orders and follow up as documented in patient record.   Counseling Getting to Good Bowel Health: Your goal is to have one soft bowel movement each day. Drink at least 8 glasses of water each day. Eat plenty of fiber (goal is over 25 grams each day). It is best to get most of your fiber from dietary sources which includes leafy green vegetables, fresh fruit, and whole grains. You may need to add fiber with the help of OTC fiber supplements. These include Metamucil, Citrucel, and Flaxseed. If you are still having trouble, try adding Miralax or Magnesium Citrate. If all of these changes do not work, Cabin crew.  6. Other hyperlipidemia Cardiovascular risk and specific lipid/LDL goals reviewed.  We discussed several lifestyle modifications today and Bailey [name] will continue to work on diet, exercise and weight loss efforts. Orders and follow up as documented in patient record.   Counseling Intensive lifestyle modifications are the first line treatment for this issue. . Dietary changes: Increase soluble fiber. Decrease simple carbohydrates. . Exercise changes: Moderate to vigorous-intensity aerobic activity 150 minutes per week if tolerated. . Lipid-lowering medications: see documented in medical record.  7. Cramps of lower extremity Will check labs, as below.  Orders - Comprehensive metabolic panel - Brain natriuretic peptide - Magnesium - CK  8. Other urinary incontinence Will check a urine sample today, as below.  Orders - Urinalysis, Routine w reflex microscopic  9. Class 1 obesity with serious  comorbidity and body mass index (BMI) of 32.0 to 32.9 in adult, unspecified obesity type Bailey [name] is currently in the action stage of change. As such, her goal is to continue with weight loss efforts. She has agreed to the Category 2 Plan.   Exercise goals: No exercise has been prescribed at this time.  Behavioral modification strategies: increasing lean protein intake and increasing water intake.  Bailey [name] has agreed to follow-up with our clinic in 2 weeks. She was informed of the importance of frequent follow-up visits to maximize her success with intensive lifestyle modifications for her multiple health conditions.   Bailey [name] was informed we would discuss her lab results at her next visit unless there is a critical issue that needs to be addressed sooner. Bailey [name] agreed to keep her next visit at the agreed upon time to discuss these results.  Objective:   Blood pressure 117/78, pulse 60, temperature 98.2 F (36.8 C), temperature source Oral, height 5\' 5"  (1.651 m), weight 193 lb (87.5 kg), SpO2 97 %. Body mass index is 32.12 kg/m.  General: Cooperative, alert, well developed, in no acute distress. HEENT: Conjunctivae and lids unremarkable. Cardiovascular: Regular rhythm.  Lungs: Normal work of breathing. Neurologic: No focal deficits.  Lab Results  Component Value Date   CREATININE 0.66 04/04/2019   BUN 16 04/04/2019   NA 141 04/04/2019   K 3.8 04/04/2019   CL 102 04/04/2019   CO2 25 04/04/2019   Lab Results  Component Value Date   ALT 16 04/04/2019   AST 19 04/04/2019   ALKPHOS 68 04/04/2019   BILITOT 0.6 04/04/2019   Lab Results  Component Value Date   HGBA1C 5.9 (H) 02/13/2019   HGBA1C 5.8 (H) 08/02/2018   HGBA1C 5.4 12/17/2016   No results found for: INSULIN Lab Results  Component Value Date   TSH 2.500 08/02/2018   Lab Results  Component Value Date   CHOL 154 04/04/2019   HDL 53 04/04/2019   LDLCALC 84 04/04/2019   LDLDIRECT 177.1 09/20/2012   TRIG 90  04/04/2019   CHOLHDL 2.9 04/04/2019   Lab Results  Component Value Date   WBC 6.6 08/02/2018   HGB 14.6 08/02/2018   HCT 39.2 08/02/2018   MCV 91 08/02/2018   PLT 245 08/02/2018   Obesity Behavioral Intervention:   Approximately 15 minutes were spent on the discussion below.  ASK: We discussed the diagnosis of obesity with Horris Latino today and Ahmyah agreed to give [place] permission to discuss obesity behavioral modification therapy today.  ASSESS: Beverlyann has the diagnosis of obesity and her BMI today is 32.1. Nari is in the action stage of change.   ADVISE: Carlea was educated on the multiple health risks of obesity as well as the benefit of weight loss to improve her health. She was advised of the need for long term treatment and the importance of lifestyle modifications to improve her current health and to decrease her risk of future health problems.  AGREE: Multiple dietary modification options and treatment options were discussed and Varina agreed to follow the recommendations documented in the above note.  ARRANGE: Jaliah was educated on the importance of frequent visits to treat obesity as outlined per CMS and USPSTF guidelines and agreed to schedule her next follow up appointment today.  Attestation Statements:   Reviewed by clinician on day of visit: allergies, medications, problem list, medical history, surgical history, family history, social history, and previous encounter notes.  CPT Code 1) Number and Complexity of Problems Addressed 2) Amount and/or Complexity of Data to be Reviewed and Analyzed 3) Risk of Complications and/or Morbidity or Mortality of Patient Management  [phone]  or  99213 Low  []   2 or more self-limited or minor problems []   1 stable chronic illness []   1 acute, uncomplicated illness or injury Limited (Must meet the requirements of at least 1 out of 2 categories)  Category 1:  Any combination of 2 from the following:  []   Review of prior external  note(s) from unique source []   Review of the result(s) of unique test  []   Ordering of each unique test OR Category 2:  []   Assessment requiring an independent historian Low risk of morbidity from additional diagnostic testing or treatment  99204  or  99214 Moderate  []   1 or more chronic illnesses with exacerbation, progression, or side effects of treatment [x]   2 or more stable chronic illnesses [x]   1 undiagnosed new problem with uncertain prognosis []   1 acute illness with systemic symptoms []   1 acute complicated injury Moderate (Must meet the requirements of at least 1 out of 3 categories)  Category 1:  Any combination of 3 from the following: []   Review of prior external  note(s) from unique source []   Review of the result(s) of unique test [x]   Ordering of each test []   Assessment requiring an independent historian OR Category 2:  []   Independent interpretation of tests Independent interpretation of a test performed by another physician/other qualified health care professional (not separately reported)  OR Category 3:  []   Discussion of management or test interpretation  Moderate risk of morbidity from additional diagnostic testing or treatment   Examples:  []   Prescription drug management  []   Diagnosis or treatment significantly limited by social determinants of health   I, Water quality scientist, CMA, am acting as Location manager for [employer], DO.  I have reviewed the above documentation for accuracy and completeness, and I agree with the above. Briscoe Deutscher, DO

## 2019-05-12 LAB — COMPREHENSIVE METABOLIC PANEL
ALT: 19 IU/L (ref 0–32)
AST: 19 IU/L (ref 0–40)
Albumin/Globulin Ratio: 1.7 (ref 1.2–2.2)
Albumin: 4.5 g/dL (ref 3.8–4.8)
Alkaline Phosphatase: 70 IU/L (ref 39–117)
BUN/Creatinine Ratio: 23 (ref 12–28)
BUN: 18 mg/dL (ref 8–27)
Bilirubin Total: 0.6 mg/dL (ref 0.0–1.2)
CO2: 25 mmol/L (ref 20–29)
Calcium: 10.6 mg/dL — ABNORMAL HIGH (ref 8.7–10.3)
Chloride: 100 mmol/L (ref 96–106)
Creatinine, Ser: 0.78 mg/dL (ref 0.57–1.00)
GFR calc Af Amer: 90 mL/min/{1.73_m2} (ref >59)
GFR calc non Af Amer: 78 mL/min/{1.73_m2} (ref >59)
Globulin, Total: 2.7 g/dL (ref 1.5–4.5)
Glucose: 116 mg/dL — ABNORMAL HIGH (ref 65–99)
Potassium: 4.2 mmol/L (ref 3.5–5.2)
Sodium: 138 mmol/L (ref 134–144)
Total Protein: 7.2 g/dL (ref 6.0–8.5)

## 2019-05-12 LAB — CK: Total CK: 114 U/L (ref 32–182)

## 2019-05-12 LAB — URINALYSIS, ROUTINE W REFLEX MICROSCOPIC
Bilirubin, UA: NEGATIVE
Glucose, UA: NEGATIVE
Ketones, UA: NEGATIVE
Leukocytes,UA: NEGATIVE
Nitrite, UA: NEGATIVE
Protein,UA: NEGATIVE
Specific Gravity, UA: 1.009 (ref 1.005–1.030)
Urobilinogen, Ur: 0.2 mg/dL (ref 0.2–1.0)
pH, UA: 6 (ref 5.0–7.5)

## 2019-05-12 LAB — MICROSCOPIC EXAMINATION
Bacteria, UA: NONE SEEN
Casts: NONE SEEN /lpf
RBC, Urine: NONE SEEN /hpf (ref 0–2)
WBC, UA: NONE SEEN /hpf (ref 0–5)

## 2019-05-12 LAB — BRAIN NATRIURETIC PEPTIDE: BNP: 29.5 pg/mL (ref 0.0–100.0)

## 2019-05-12 LAB — MAGNESIUM: Magnesium: 2 mg/dL (ref 1.6–2.3)

## 2019-05-17 ENCOUNTER — Ambulatory Visit: Payer: Medicare Other | Admitting: Gastroenterology

## 2019-05-17 ENCOUNTER — Encounter: Payer: Self-pay | Admitting: Gastroenterology

## 2019-05-17 VITALS — BP 138/80 | HR 59 | Temp 97.2°F | Ht 65.0 in | Wt 199.0 lb

## 2019-05-17 DIAGNOSIS — Z01818 Encounter for other preprocedural examination: Secondary | ICD-10-CM | POA: Diagnosis not present

## 2019-05-17 DIAGNOSIS — K5904 Chronic idiopathic constipation: Secondary | ICD-10-CM | POA: Diagnosis not present

## 2019-05-17 DIAGNOSIS — Z86010 Personal history of colonic polyps: Secondary | ICD-10-CM

## 2019-05-17 MED ORDER — NA SULFATE-K SULFATE-MG SULF 17.5-3.13-1.6 GM/177ML PO SOLN: 1.0000 | Freq: Once | ORAL | 0 refills | Status: AC

## 2019-05-17 NOTE — Patient Instructions (Signed)
Start over the counter Miralax mixing 17 grams in 8 oz of water daily. Make sure you are taking Miralax regularly one week prior leading up to your Colonoscopy.   You have been scheduled for a colonoscopy. Please follow written instructions given to you at your visit today.  Please pick up your prep supplies at the pharmacy within the next 1-3 days. If you use inhalers (even only as needed), please bring them with you on the day of your procedure.  Thank you for choosing me and [facility].  Pricilla Riffle. Dagoberto Ligas., MD., Marval Regal

## 2019-05-17 NOTE — Progress Notes (Signed)
History of Present Illness: This is a 69 year old female referred by Mellody Dance, DO for the evaluation of constipation.  She was previously evaluated in 2015 with constipation, bloating and right-sided abdominal pain.  She has a history of diverticulitis and cholelithiasis.  She relates many years of constipation and over the past few years generally has 2 bowel movements per week.  In between bowel movements she notes bloating and mild discomfort.  She tried MiraLAX at the recommendation of another physician and this was effective.  She drinks plenty water and has a reasonable amount of fiber intake. Denies weight loss, abdominal pain, diarrhea, change in stool caliber, melena, hematochezia, nausea, vomiting, dysphagia, reflux symptoms, chest pain.   Colonoscopy July 2015  1. Pedunculated polyp measuring 1 cm in the sigmoid colon; polypectomy performed using snare cautery 2. Sessile polyp measuring 4 mm in the sigmoid colon; polypectomy performed with a cold snare 3. Moderate diverticulosis in the descending colon and sigmoid colon Path: Tubular adenoma and prolapse type polyp   Allergies  Allergen Reactions  . Codeine Nausea Only  . Statins    Outpatient Medications Prior to Visit  Medication Sig Dispense Refill  . aspirin EC 81 MG tablet Take 1 tablet (81 mg total) by mouth daily. 100 tablet 3  . COLLAGEN PO Take by mouth. Take one scoop by mouth every morning    . ezetimibe (ZETIA) 10 MG tablet Take 1 tablet (10 mg total) by mouth at bedtime. 90 tablet 0  . hydrochlorothiazide (HYDRODIURIL) 12.5 MG tablet TAKE 1 TABLET BY MOUTH EVERY DAY 90 tablet 1  . Olmesartan-amLODIPine-HCTZ 20-5-12.5 MG TABS Take 1 tablet by mouth daily. 90 tablet 1  . rosuvastatin (CRESTOR) 5 MG tablet TAKE 1/2 TABLET EVERY OTHER NIGHT BEFORE BEDTIME 23 tablet 0  . Vitamin D, Ergocalciferol, (DRISDOL) 1.25 MG (50000 UT) CAPS capsule TAKE 1 CAPSULE BY MOUTH EVERY 7 DAYS 12 capsule 10   No  facility-administered medications prior to visit.   Past Medical History:  Diagnosis Date  . Arthritis   . Arthritis of right knee   . Borderline diabetes   . Burning sensation of feet   . Colitis    hx colitis-gi bleed-2006  . Constipation   . DYSLIPIDEMIA   . Gallbladder problem   . Hx of cardiovascular stress test    Lexiscan Myoview 9/14:  Small, fixed apical anterior perfusion defect (worse at rest) - probable soft tissue attenuation, EF 61%, low risk study  . HYPERTENSION   . Lower extremity edema   . Partial tear of subscapularis tendon 04/22/2012  . POSTMENOPAUSAL STATUS   . Supraspinatus tendon tear 04/22/2012  . Tubular adenoma of colon 08/2013  . Vitamin D deficiency    Past Surgical History:  Procedure Laterality Date  . ABDOMINAL HYSTERECTOMY  1987   Partial  . APPENDECTOMY  2003  . CERVICAL FUSION  2002  . COLONOSCOPY  06/03/04   isch colitis (hosp for same)  . HERNIA REPAIR  1975   umb  . SHOULDER ARTHROSCOPY WITH ROTATOR CUFF REPAIR AND SUBACROMIAL DECOMPRESSION Right 04/22/2012   Procedure: SHOULDER ARTHROSCOPY WITH ROTATOR CUFF REPAIR AND SUBACROMIAL DECOMPRESSION;  Surgeon: Johnny Bridge, MD;  Location: [facility];  Service: Orthopedics;  Laterality: Right;  RIGHT SHOULDER ARTHROSCOPY DEBRIDEMENT LIMITED, DECOMPRESSION SUBACROMIAL PARTIAL ACROMIOPLASTY WITH CORACOACROMIAL RELEASE, WITH ROTATOR CUFF REPAIR AND SUBSCAPULARIS REPAIR  . TUBAL LIGATION     Social History   Socioeconomic History  . Marital status: Married  Spouse name: Adron Bene  . Number of children: Not on file  . Years of education: Not on file  . Highest education level: Not on file  Occupational History  . Not on file  Tobacco Use  . Smoking status: Never Smoker  . Smokeless tobacco: Never Used  . Tobacco comment: exposed to second hand (spouse smokes), Married  Substance and Sexual Activity  . Alcohol use: No  . Drug use: No  . Sexual activity: Yes    Birth  control/protection: Surgical  Other Topics Concern  . Not on file  Social History Narrative  . Not on file   Social Determinants of Health   Financial Resource Strain:   . Difficulty of Paying Living Expenses:   Food Insecurity:   . Worried About Charity fundraiser in the Last Year:   . Arboriculturist in the Last Year:   Transportation Needs:   . Film/video editor (Medical):   Marland Kitchen Lack of Transportation (Non-Medical):   Physical Activity:   . Days of Exercise per Week:   . Minutes of Exercise per Session:   Stress:   . Feeling of Stress :   Social Connections:   . Frequency of Communication with Friends and Family:   . Frequency of Social Gatherings with Friends and Family:   . Attends Religious Services:   . Active Member of Clubs or Organizations:   . Attends Archivist Meetings:   Marland Kitchen Marital Status:    Family History  Problem Relation Age of Onset  . Hypertension Father   . Stroke Father   . Heart disease Father   . Aneurysm Mother        brain  . Stroke Mother   . Stroke Sister   . Hypertension Sister   . Diabetes Brother       Review of Systems: Pertinent positive and negative review of systems were noted in the above HPI section. All other review of systems were otherwise negative.   Physical Exam: General: Well developed, well nourished, no acute distress Head: Normocephalic and atraumatic Eyes:  sclerae anicteric, EOMI Ears: Normal auditory acuity Mouth: Not examined, mask on during Covid-19 pandemic Neck: Supple, no masses or thyromegaly Lungs: Clear throughout to auscultation Heart: Regular rate and rhythm; no murmurs, rubs or bruits Abdomen: Soft, non tender and non distended. No masses, hepatosplenomegaly or hernias noted. Normal Bowel sounds Rectal: Not done Musculoskeletal: Symmetrical with no gross deformities  Skin: No lesions on visible extremities Pulses:  Normal pulses noted Extremities: No clubbing, cyanosis, edema or  deformities noted Neurological: Alert oriented x 4, grossly nonfocal Cervical Nodes:  No significant cervical adenopathy Inguinal Nodes: No significant inguinal adenopathy Psychological:  Alert and cooperative. Normal mood and affect   Assessment and Recommendations:  1. Chronic idiopathic constipation.  MiraLAX qd or qod for long-term management.  Continue with 6 to 8 glasses of water daily and a moderate amount of fiber as tolerated.  2. Personal history of adenomatous colon polyps.  She is due for surveillance colonoscopy.  Schedule colonoscopy. The risks (including bleeding, perforation, infection, missed lesions, medication reactions and possible hospitalization or surgery if complications occur), benefits, and alternatives to colonoscopy with possible biopsy and possible polypectomy were discussed with the patient and they consent to proceed.   3.  Cholelithiasis, asymptomatic.    cc: Mellody Dance, DO [address] [place],  [place] [id number]

## 2019-05-19 ENCOUNTER — Encounter (HOSPITAL_BASED_OUTPATIENT_CLINIC_OR_DEPARTMENT_OTHER): Payer: Medicare Other | Admitting: Registered"

## 2019-05-19 ENCOUNTER — Encounter: Payer: Self-pay | Admitting: Gastroenterology

## 2019-05-19 DIAGNOSIS — R7303 Prediabetes: Secondary | ICD-10-CM | POA: Diagnosis not present

## 2019-05-22 ENCOUNTER — Telehealth: Payer: Self-pay | Admitting: Gastroenterology

## 2019-05-22 DIAGNOSIS — H2513 Age-related nuclear cataract, bilateral: Secondary | ICD-10-CM | POA: Diagnosis not present

## 2019-05-22 DIAGNOSIS — H43813 Vitreous degeneration, bilateral: Secondary | ICD-10-CM | POA: Diagnosis not present

## 2019-05-22 DIAGNOSIS — H5202 Hypermetropia, left eye: Secondary | ICD-10-CM | POA: Diagnosis not present

## 2019-05-22 DIAGNOSIS — H0100A Unspecified blepharitis right eye, upper and lower eyelids: Secondary | ICD-10-CM | POA: Diagnosis not present

## 2019-05-22 MED ORDER — NA SULFATE-K SULFATE-MG SULF 17.5-3.13-1.6 GM/177ML PO SOLN: 1.0000 | Freq: Once | ORAL | 0 refills | Status: AC

## 2019-05-22 NOTE — Telephone Encounter (Signed)
Prescription for Suprep with coupon code sent to patient's pharmacy. Left message for patient informing her.

## 2019-05-23 DIAGNOSIS — M25561 Pain in right knee: Secondary | ICD-10-CM | POA: Diagnosis not present

## 2019-05-23 DIAGNOSIS — M1711 Unilateral primary osteoarthritis, right knee: Secondary | ICD-10-CM | POA: Diagnosis not present

## 2019-05-23 DIAGNOSIS — M6281 Muscle weakness (generalized): Secondary | ICD-10-CM | POA: Diagnosis not present

## 2019-05-24 ENCOUNTER — Encounter: Payer: Self-pay | Admitting: Registered"

## 2019-05-24 NOTE — Progress Notes (Signed)
On 05/19/19 pt completed a post core session of the Diabetes Prevention Program course virtually with [facility]. By the end of this session patients are able to complete the following objectives:   Learning Objectives:  List ways to make recipes healthier.   List lower-fat and lower-calorie substitutions for common ingredients.   Identify low-fat cooking methods.   Describe how to choose a cookbook that works best for their needs.   Goals:   Record weight taken outside of class.   Track foods and beverages eaten each day in the "Food and Activity Tracker," including calories and fat grams for each item.    Track activity type, minutes you were active, and distance you reached each day in the "Food and Activity Tracker."   Follow-Up Plan:  Attend next session.   Email completed "Food and Activity Trackers" before next session to be reviewed by Lifestyle Coach.

## 2019-05-25 ENCOUNTER — Ambulatory Visit (INDEPENDENT_AMBULATORY_CARE_PROVIDER_SITE_OTHER): Payer: Medicare Other

## 2019-05-25 ENCOUNTER — Other Ambulatory Visit: Payer: Self-pay | Admitting: Gastroenterology

## 2019-05-25 DIAGNOSIS — Z1159 Encounter for screening for other viral diseases: Secondary | ICD-10-CM | POA: Diagnosis not present

## 2019-05-25 LAB — SARS CORONAVIRUS 2 (TAT 6-24 HRS): SARS Coronavirus 2: NEGATIVE

## 2019-05-29 ENCOUNTER — Encounter: Payer: Self-pay | Admitting: Gastroenterology

## 2019-05-29 ENCOUNTER — Other Ambulatory Visit: Payer: Self-pay

## 2019-05-29 ENCOUNTER — Ambulatory Visit (AMBULATORY_SURGERY_CENTER): Payer: Medicare Other | Admitting: Gastroenterology

## 2019-05-29 VITALS — BP 173/79 | HR 54 | Temp 96.8°F | Resp 8 | Ht 65.0 in | Wt 199.0 lb

## 2019-05-29 DIAGNOSIS — Z86010 Personal history of colonic polyps: Secondary | ICD-10-CM | POA: Diagnosis not present

## 2019-05-29 DIAGNOSIS — Z1211 Encounter for screening for malignant neoplasm of colon: Secondary | ICD-10-CM | POA: Diagnosis not present

## 2019-05-29 DIAGNOSIS — D122 Benign neoplasm of ascending colon: Secondary | ICD-10-CM | POA: Diagnosis not present

## 2019-05-29 DIAGNOSIS — D124 Benign neoplasm of descending colon: Secondary | ICD-10-CM | POA: Diagnosis not present

## 2019-05-29 DIAGNOSIS — D125 Benign neoplasm of sigmoid colon: Secondary | ICD-10-CM | POA: Diagnosis not present

## 2019-05-29 DIAGNOSIS — K5904 Chronic idiopathic constipation: Secondary | ICD-10-CM

## 2019-05-29 DIAGNOSIS — K635 Polyp of colon: Secondary | ICD-10-CM | POA: Diagnosis not present

## 2019-05-29 MED ORDER — SODIUM CHLORIDE 0.9 % IV SOLN: 500.0000 mL | Freq: Once | INTRAVENOUS | Status: DC

## 2019-05-29 NOTE — Progress Notes (Signed)
Called to room to assist during endoscopic procedure.  Patient ID and intended procedure confirmed with present staff. Received instructions for my participation in the procedure from the performing physician.  

## 2019-05-29 NOTE — Progress Notes (Signed)
To PACU, VSS. Report to [email or web address] 

## 2019-05-29 NOTE — Patient Instructions (Signed)
Discharge instructions given. Handouts on polyps and diverticulosis. Resume previous medications. YOU HAD AN ENDOSCOPIC PROCEDURE TODAY AT THE [facility]:   Refer to the procedure report that was given to you for any specific questions about what was found during the examination.  If the procedure report does not answer your questions, please call your gastroenterologist to clarify.  If you requested that your care partner not be given the details of your procedure findings, then the procedure report has been included in a sealed envelope for you to review at your convenience later.  YOU SHOULD EXPECT: Some feelings of bloating in the abdomen. Passage of more gas than usual.  Walking can help get rid of the air that was put into your GI tract during the procedure and reduce the bloating. If you had a lower endoscopy (such as a colonoscopy or flexible sigmoidoscopy) you may notice spotting of blood in your stool or on the toilet paper. If you underwent a bowel prep for your procedure, you may not have a normal bowel movement for a few days.  Please Note:  You might notice some irritation and congestion in your nose or some drainage.  This is from the oxygen used during your procedure.  There is no need for concern and it should clear up in a day or so.  SYMPTOMS TO REPORT IMMEDIATELY:   Following lower endoscopy (colonoscopy or flexible sigmoidoscopy):  Excessive amounts of blood in the stool  Significant tenderness or worsening of abdominal pains  Swelling of the abdomen that is new, acute  Fever of 100F or higher   For urgent or emergent issues, a gastroenterologist can be reached at any hour by calling [phone]. Do not use MyChart messaging for urgent concerns.    DIET:  We do recommend a small meal at first, but then you may proceed to your regular diet.  Drink plenty of fluids but you should avoid alcoholic beverages for 24 hours.  ACTIVITY:  You should plan to take  it easy for the rest of today and you should NOT DRIVE or use heavy machinery until tomorrow (because of the sedation medicines used during the test).    FOLLOW UP: Our staff will call the number listed on your records 48-72 hours following your procedure to check on you and address any questions or concerns that you may have regarding the information given to you following your procedure. If we do not reach you, we will leave a message.  We will attempt to reach you two times.  During this call, we will ask if you have developed any symptoms of COVID 19. If you develop any symptoms (ie: fever, flu-like symptoms, shortness of breath, cough etc.) before then, please call [phone].  If you test positive for Covid 19 in the 2 weeks post procedure, please call and report this information to us.    If any biopsies were taken you will be contacted by phone or by letter within the next 1-3 weeks.  Please call us at [phone] if you have not heard about the biopsies in 3 weeks.    SIGNATURES/CONFIDENTIALITY: You and/or your care partner have signed paperwork which will be entered into your electronic medical record.  These signatures attest to the fact that that the information above on your After Visit Summary has been reviewed and is understood.  Full responsibility of the confidentiality of this discharge information lies with you and/or your care-partner. 

## 2019-05-29 NOTE — Op Note (Signed)
[facility] Patient Name: Darriell Demory Procedure Date: 05/29/2019 3:46 PM MRN: [id number] Endoscopist: Ladene Artist , MD Age: 69 Referring MD:  Date of Birth: [birth year] Gender: Female Account #: [id number] Procedure:                Colonoscopy Indications:              Surveillance: Personal history of adenomatous                            polyps on last colonoscopy > 5 years ago Medicines:                Monitored Anesthesia Care Procedure:                Pre-Anesthesia Assessment:                           - Prior to the procedure, a History and Physical                            was performed, and patient medications and                            allergies were reviewed. The patient's tolerance of                            previous anesthesia was also reviewed. The risks                            and benefits of the procedure and the sedation                            options and risks were discussed with the patient.                            All questions were answered, and informed consent                            was obtained. Prior Anticoagulants: The patient has                            taken no previous anticoagulant or antiplatelet                            agents. ASA Grade Assessment: II - A patient with                            mild systemic disease. After reviewing the risks                            and benefits, the patient was deemed in                            satisfactory condition to undergo the procedure.  After obtaining informed consent, the colonoscope                            was passed under direct vision. Throughout the                            procedure, the patient's blood pressure, pulse, and                            oxygen saturations were monitored continuously. The                            Colonoscope was introduced through the anus and                            advanced to the the  cecum, identified by                            appendiceal orifice and ileocecal valve. The                            ileocecal valve, appendiceal orifice, and rectum                            were photographed. The quality of the bowel                            preparation was adequate after extensive lavage and                            suctioning. The colonoscopy was performed without                            difficulty. The patient tolerated the procedure                            well. Scope In: 3:49:46 PM Scope Out: 4:10:50 PM Scope Withdrawal Time: 0 hours 17 minutes 33 seconds  Total Procedure Duration: 0 hours 21 minutes 4 seconds  Findings:                 The perianal and digital rectal examinations were                            normal.                           Five sessile polyps were found in the sigmoid colon                            (1), descending colon (1) and ascending colon (3).                            The polyps were 5 to 8 mm in size. These polyps  were removed with a cold snare. One 6 mm ascending                            colon polyp that appeared to be an adenoma was not                            retrieved. Resection and retrieval were complete.                           Scattered small-mouthed diverticula were found in                            the right colon. There was no evidence of                            diverticular bleeding.                           Multiple medium-mouthed diverticula were found in                            the left colon. There was narrowing of the colon in                            association with the diverticular opening. There                            was evidence of diverticular spasm. There was no                            evidence of diverticular bleeding.                           The exam was otherwise without abnormality on                            direct and retroflexion  views. Complications:            No immediate complications. Estimated blood loss:                            None. Estimated Blood Loss:     Estimated blood loss: none. Impression:               - Five 5 to 8 mm polyps in the sigmoid colon, in                            the descending colon and in the ascending colon,                            removed with a cold snare. Resected and retrieved.                           - Moderate diverticulosis in the left colon.                           -  Mild diverticulosis in the right colon.                           - The examination was otherwise normal on direct                            and retroflexion views. Recommendation:           - Repeat colonoscopy in 3 years for surveillance                            with a more extensive bowel prep.                           - Patient has a contact number available for                            emergencies. The signs and symptoms of potential                            delayed complications were discussed with the                            patient. Return to normal activities tomorrow.                            Written discharge instructions were provided to the                            patient.                           - High fiber diet.                           - Continue present medications.                           - Await pathology results. Ladene Artist, MD 05/29/2019 4:17:27 PM This report has been signed electronically.

## 2019-05-31 ENCOUNTER — Telehealth: Payer: Self-pay

## 2019-05-31 ENCOUNTER — Encounter (INDEPENDENT_AMBULATORY_CARE_PROVIDER_SITE_OTHER): Payer: Self-pay | Admitting: Family Medicine

## 2019-05-31 ENCOUNTER — Other Ambulatory Visit: Payer: Self-pay

## 2019-05-31 ENCOUNTER — Ambulatory Visit (INDEPENDENT_AMBULATORY_CARE_PROVIDER_SITE_OTHER): Payer: Medicare Other | Admitting: Family Medicine

## 2019-05-31 VITALS — BP 132/79 | HR 58 | Temp 98.4°F | Ht 65.0 in | Wt 196.0 lb

## 2019-05-31 DIAGNOSIS — E7849 Other hyperlipidemia: Secondary | ICD-10-CM

## 2019-05-31 DIAGNOSIS — K5904 Chronic idiopathic constipation: Secondary | ICD-10-CM | POA: Diagnosis not present

## 2019-05-31 DIAGNOSIS — R7303 Prediabetes: Secondary | ICD-10-CM

## 2019-05-31 DIAGNOSIS — E669 Obesity, unspecified: Secondary | ICD-10-CM | POA: Diagnosis not present

## 2019-05-31 DIAGNOSIS — Z6832 Body mass index (BMI) 32.0-32.9, adult: Secondary | ICD-10-CM

## 2019-05-31 DIAGNOSIS — I10 Essential (primary) hypertension: Secondary | ICD-10-CM | POA: Diagnosis not present

## 2019-05-31 NOTE — Progress Notes (Signed)
Chief Complaint:   OBESITY Yemariam is here to discuss her progress with her obesity treatment plan along with follow-up of her obesity related diagnoses. Jaley is on the Category 2 Plan and states she is following her eating plan approximately 70% of the time. Asheli states she is doing PT for 45 minutes 3-5 times per week.  Today's visit was #: 3 Starting weight: 196 lbs Starting date: 04/27/2019 Today's weight: 196 lbs Today's date: 05/31/2019 Total lbs lost to date: 0 Total lbs lost since last in-office visit: 0  Interim History: Alan had a colonoscopy and had 5 polyps removed.  She is due back in 3 years.  She reports that her lower extremity cramping is better.  She is off the statin as well as the extra HCTZ.  She has also increased her water intake.  Subjective:   1. Essential hypertension Review: taking medications as instructed, no medication side effects noted, no chest pain on exertion, no dyspnea on exertion, no swelling of ankles.   BP Readings from Last 3 Encounters:  05/31/19 132/79  05/29/19 (!) 173/79  05/17/19 138/80   2. Prediabetes Teresia has a diagnosis of prediabetes based on her elevated HgA1c and was informed this puts her at greater risk of developing diabetes. She continues to work on diet and exercise to decrease her risk of diabetes. She denies nausea or hypoglycemia.  Lab Results  Component Value Date   HGBA1C 5.9 (H) 02/13/2019   3. Chronic idiopathic constipation Jainaba notes constipation.  She takes MiraLAX, Metamucil, and eats Fiber One cereal.  4. Other hyperlipidemia Kaleena has hyperlipidemia and has been trying to improve her cholesterol levels with intensive lifestyle modification including a low saturated fat diet, exercise and weight loss. She denies any chest pain, claudication or myalgias.  She has stopped taking Crestor.  Lab Results  Component Value Date   ALT 19 05/11/2019   AST 19 05/11/2019   ALKPHOS 70 05/11/2019   BILITOT 0.6 05/11/2019   Lab Results  Component Value Date   CHOL 154 04/04/2019   HDL 53 04/04/2019   LDLCALC 84 04/04/2019   LDLDIRECT 177.1 09/20/2012   TRIG 90 04/04/2019   CHOLHDL 2.9 04/04/2019   Assessment/Plan:   1. Essential hypertension Roslyn is working on healthy weight loss and exercise to improve blood pressure control. We will watch for signs of hypotension as she continues her lifestyle modifications.  2. Prediabetes Rafa will continue to work on weight loss, exercise, and decreasing simple carbohydrates to help decrease the risk of diabetes.   3. Chronic idiopathic constipation Enjolie was informed that a decrease in bowel movement frequency is normal while losing weight, but stools should not be hard or painful. Orders and follow up as documented in patient record.   Counseling Getting to Good Bowel Health: Your goal is to have one soft bowel movement each day. Drink at least 8 glasses of water each day. Eat plenty of fiber (goal is over 25 grams each day). It is best to get most of your fiber from dietary sources which includes leafy green vegetables, fresh fruit, and whole grains. You may need to add fiber with the help of OTC fiber supplements. These include Metamucil, Citrucel, and Flaxseed. If you are still having trouble, try adding Miralax or Magnesium Citrate. If all of these changes do not work, Cabin crew.  4. Other hyperlipidemia Cardiovascular risk and specific lipid/LDL goals reviewed.  We discussed several lifestyle modifications today and Merrell will  continue to work on diet, exercise and weight loss efforts. Orders and follow up as documented in patient record.   Counseling Intensive lifestyle modifications are the first line treatment for this issue. . Dietary changes: Increase soluble fiber. Decrease simple carbohydrates. . Exercise changes: Moderate to vigorous-intensity aerobic activity 150 minutes per week if  tolerated. . Lipid-lowering medications: see documented in medical record.  5. Class 1 obesity with serious comorbidity and body mass index (BMI) of 32.0 to 32.9 in adult, unspecified obesity type Zanaya is currently in the action stage of change. As such, her goal is to continue with weight loss efforts. She has agreed to the Category 2 Plan.   Exercise goals: As is.  Behavioral modification strategies: increasing water intake.  Mell has agreed to follow-up with our clinic in 3 weeks. She was informed of the importance of frequent follow-up visits to maximize her success with intensive lifestyle modifications for her multiple health conditions.   Objective:   Blood pressure 132/79, pulse (!) 58, temperature 98.4 F (36.9 C), temperature source Oral, height 5\' 5"  (1.651 m), weight 196 lb (88.9 kg), SpO2 99 %. Body mass index is 32.62 kg/m.  General: Cooperative, alert, well developed, in no acute distress. HEENT: Conjunctivae and lids unremarkable. Cardiovascular: Regular rhythm.  Lungs: Normal work of breathing. Neurologic: No focal deficits.   Lab Results  Component Value Date   CREATININE 0.78 05/11/2019   BUN 18 05/11/2019   NA 138 05/11/2019   K 4.2 05/11/2019   CL 100 05/11/2019   CO2 25 05/11/2019   Lab Results  Component Value Date   ALT 19 05/11/2019   AST 19 05/11/2019   ALKPHOS 70 05/11/2019   BILITOT 0.6 05/11/2019   Lab Results  Component Value Date   HGBA1C 5.9 (H) 02/13/2019   HGBA1C 5.8 (H) 08/02/2018   HGBA1C 5.4 12/17/2016   Lab Results  Component Value Date   TSH 2.500 08/02/2018   Lab Results  Component Value Date   CHOL 154 04/04/2019   HDL 53 04/04/2019   LDLCALC 84 04/04/2019   LDLDIRECT 177.1 09/20/2012   TRIG 90 04/04/2019   CHOLHDL 2.9 04/04/2019   Lab Results  Component Value Date   WBC 6.6 08/02/2018   HGB 14.6 08/02/2018   HCT 39.2 08/02/2018   MCV 91 08/02/2018   PLT 245 08/02/2018   Obesity Behavioral Intervention  Documentation:   Approximately 15 minutes were spent on the discussion below.  ASK: We discussed the diagnosis of obesity with Horris Latino today and Lekia agreed to give [place] permission to discuss obesity behavioral modification therapy today.  ASSESS: Truly has the diagnosis of obesity and her BMI today is 32.7. Maizee is in the action stage of change.   ADVISE: Ares was educated on the multiple health risks of obesity as well as the benefit of weight loss to improve her health. She was advised of the need for long term treatment and the importance of lifestyle modifications to improve her current health and to decrease her risk of future health problems.  AGREE: Multiple dietary modification options and treatment options were discussed and Corayma agreed to follow the recommendations documented in the above note.  ARRANGE: Johannah was educated on the importance of frequent visits to treat obesity as outlined per CMS and USPSTF guidelines and agreed to schedule her next follow up appointment today.  Attestation Statements:   Reviewed by clinician on day of visit: allergies, medications, problem list, medical history, surgical history, family history,  social history, and previous encounter notes.  I, Water quality scientist, CMA, am acting as Location manager for [employer], DO.  I have reviewed the above documentation for accuracy and completeness, and I agree with the above. Briscoe Deutscher, DO

## 2019-05-31 NOTE — Telephone Encounter (Signed)
Covid-19 screening questions   Do you now or have you had a fever in the last 14 days? No.  Do you have any respiratory symptoms of shortness of breath or cough now or in the last 14 days? No.  Do you have any family members or close contacts with diagnosed or suspected Covid-19 in the past 14 days? No.  Have you been tested for Covid-19 and found to be positive? No.       Follow up Call-  Call back number 05/29/2019  Post procedure Call Back phone  # [phone]  Permission to leave phone message Yes  Some recent data might be hidden     Patient questions:  Do you have a fever, pain , or abdominal swelling? No. Pain Score  0 *  Have you tolerated food without any problems? Yes.    Have you been able to return to your normal activities? Yes.    Do you have any questions about your discharge instructions: Diet   No. Medications  No. Follow up visit  No.  Do you have questions or concerns about your Care? Yes.  Pt. Wanted to review could she ride her recumbent bike, and wants to do well with taking on a high fiber diet.  Encouraged pt. To drink plenty of water, follow her high fiber diet outline, and exercise is fine, and encouraged.  Actions: * If pain score is 4 or above: No action needed, pain <4.

## 2019-06-01 ENCOUNTER — Encounter: Payer: Self-pay | Admitting: Gastroenterology

## 2019-06-08 ENCOUNTER — Other Ambulatory Visit: Payer: Self-pay | Admitting: Obstetrics and Gynecology

## 2019-06-08 DIAGNOSIS — Z1231 Encounter for screening mammogram for malignant neoplasm of breast: Secondary | ICD-10-CM

## 2019-06-19 ENCOUNTER — Ambulatory Visit (INDEPENDENT_AMBULATORY_CARE_PROVIDER_SITE_OTHER): Payer: Medicare Other | Admitting: Family Medicine

## 2019-06-20 DIAGNOSIS — R3121 Asymptomatic microscopic hematuria: Secondary | ICD-10-CM | POA: Diagnosis not present

## 2019-06-26 ENCOUNTER — Encounter (INDEPENDENT_AMBULATORY_CARE_PROVIDER_SITE_OTHER): Payer: Self-pay | Admitting: Family Medicine

## 2019-06-26 ENCOUNTER — Ambulatory Visit (INDEPENDENT_AMBULATORY_CARE_PROVIDER_SITE_OTHER): Payer: Medicare Other | Admitting: Family Medicine

## 2019-06-26 ENCOUNTER — Other Ambulatory Visit: Payer: Self-pay

## 2019-06-26 VITALS — BP 133/78 | HR 64 | Temp 97.4°F | Ht 65.0 in | Wt 196.0 lb

## 2019-06-26 DIAGNOSIS — E7849 Other hyperlipidemia: Secondary | ICD-10-CM | POA: Diagnosis not present

## 2019-06-26 DIAGNOSIS — E669 Obesity, unspecified: Secondary | ICD-10-CM

## 2019-06-26 DIAGNOSIS — Z6832 Body mass index (BMI) 32.0-32.9, adult: Secondary | ICD-10-CM | POA: Diagnosis not present

## 2019-06-27 ENCOUNTER — Encounter (INDEPENDENT_AMBULATORY_CARE_PROVIDER_SITE_OTHER): Payer: Self-pay | Admitting: Family Medicine

## 2019-06-27 DIAGNOSIS — M1711 Unilateral primary osteoarthritis, right knee: Secondary | ICD-10-CM | POA: Diagnosis not present

## 2019-06-27 NOTE — Progress Notes (Signed)
Chief Complaint:   OBESITY Bailey [name] is here to discuss her progress with her obesity treatment plan along with follow-up of her obesity related diagnoses. Bailey [name] is on the Category 2 Plan and states she is following her eating plan approximately 50% of the time. Bailey [name] states she is physical therapy for 30-45 minutes 3 times per week.  Today's visit was #: 4 Starting weight: 196 lbs Starting date: 04/27/2019 Today's weight: 196 lbs Today's date: 06/26/2019 Total lbs lost to date: 0 Total lbs lost since last in-office visit: 0  Interim History: Bailey [name] is able to stick to the plan at breakfast and lunch. She struggles at dinner because she and her husband are both used to eating "country cooking". Her husband is not open to change, and he tends to sabotage her. She tends to cook gravies and starches with meat for dinner. She notes that it is not an option for her to stop cooking these foods due to probable conflict with her husband.   Subjective:   1. Other hyperlipidemia Bailey [name]'s lipid profile is at goal on Zetia. She is unable to tolerate Crestor due to muscle cramps. The 10-year ASCVD risk score Bailey [name] DC Bailey [name]., et al., 2013) is: 7.4%   Values used to calculate the score:     Age: 69 years     Sex: Female     Is Non-Hispanic African American: No     Diabetic: No     Tobacco smoker: No     Systolic Blood Pressure: [id number] mmHg     Is BP treated: No     HDL Cholesterol: 53 mg/dL     Total Cholesterol: 154 mg/dL Lab Results  Component Value Date   CHOL 154 04/04/2019   HDL 53 04/04/2019   LDLCALC 84 04/04/2019   LDLDIRECT 177.1 09/20/2012   TRIG 90 04/04/2019   CHOLHDL 2.9 04/04/2019     Assessment/Plan:   1. Other hyperlipidemia Cardiovascular risk and specific lipid/LDL goals reviewed. We discussed several lifestyle modifications today. Bailey [name] will continue Zetia, and will continue to work on diet, exercise and weight loss efforts. Orders and follow up as documented in  patient record.    2. Class 1 obesity with serious comorbidity and body mass index (BMI) of 32.0 to 32.9 in adult, unspecified obesity type Bailey [name] is currently in the action stage of change. As such, her goal is to continue with weight loss efforts. She has [name] to the Category 2 Plan.   We discussed weighing meat at lunch. We discussed reducing simple starches such as potatoes at dinner and adding extra non-starchy vegetables for her plate. She will limit her serving of gravies and sauces.   Exercise goals: Older adults should follow the adult guidelines. When older adults cannot meet the adult guidelines, they should be as physically active as their abilities and conditions will allow.   Behavioral modification strategies: increasing lean protein intake, decreasing simple carbohydrates, meal planning and cooking strategies, dealing with family or coworker sabotage and planning for success.  Bailey [name] to follow-up with our clinic in 2 to 3 weeks. She was informed of the importance of frequent follow-up visits to maximize her success with intensive lifestyle modifications for her multiple health conditions.   Objective:   Pulse 64, temperature (!) 97.4 F (36.3 C), temperature source Oral, height 5\' 5"  (1.651 m), weight 196 lb (88.9 kg), SpO2 99 %. Body mass index is 32.62 kg/m.  General: Cooperative, alert, well developed, in no acute distress.  HEENT: Conjunctivae and lids unremarkable. Cardiovascular: Regular rhythm.  Lungs: Normal work of breathing. Neurologic: No focal deficits.   Lab Results  Component Value Date   CREATININE 0.78 05/11/2019   BUN 18 05/11/2019   NA 138 05/11/2019   K 4.2 05/11/2019   CL 100 05/11/2019   CO2 25 05/11/2019   Lab Results  Component Value Date   ALT 19 05/11/2019   AST 19 05/11/2019   ALKPHOS 70 05/11/2019   BILITOT 0.6 05/11/2019   Lab Results  Component Value Date   HGBA1C 5.9 (H) 02/13/2019   HGBA1C 5.8 (H) 08/02/2018    HGBA1C 5.4 12/17/2016   No results found for: INSULIN Lab Results  Component Value Date   TSH 2.500 08/02/2018   Lab Results  Component Value Date   CHOL 154 04/04/2019   HDL 53 04/04/2019   LDLCALC 84 04/04/2019   LDLDIRECT 177.1 09/20/2012   TRIG 90 04/04/2019   CHOLHDL 2.9 04/04/2019   Lab Results  Component Value Date   WBC 6.6 08/02/2018   HGB 14.6 08/02/2018   HCT 39.2 08/02/2018   MCV 91 08/02/2018   PLT 245 08/02/2018   No results found for: IRON, TIBC, FERRITIN  Obesity Behavioral Intervention Documentation for Insurance:   Approximately 15 minutes were spent on the discussion below.  ASK: We discussed the diagnosis of obesity with Bailey [name] today and Bailey [name] [name] to give [place] permission to discuss obesity behavioral modification therapy today.  ASSESS: Bailey [name] has the diagnosis of obesity and her BMI today is 32.62. Bailey [name] is in the action stage of change.   ADVISE: Bailey [name] was educated on the multiple health risks of obesity as well as the benefit of weight loss to improve her health. She was advised of the need for long term treatment and the importance of lifestyle modifications to improve her current health and to decrease her risk of future health problems.  AGREE: Multiple dietary modification options and treatment options were discussed and Bailey [name] [name] to follow the recommendations documented in the above note.  ARRANGE: Bailey [name] was educated on the importance of frequent visits to treat obesity as outlined per CMS and USPSTF guidelines and [name] to schedule her next follow up appointment today.  Attestation Statements:   Reviewed by clinician on day of visit: allergies, medications, problem list, medical history, surgical history, family history, social history, and previous encounter notes.    Wilhemena Durie, am acting as Location manager for [employer], FNP-C.  I have reviewed the above documentation for accuracy and completeness, and I agree  with the above. -  Georgianne Fick, FNP

## 2019-06-28 DIAGNOSIS — R3121 Asymptomatic microscopic hematuria: Secondary | ICD-10-CM | POA: Diagnosis not present

## 2019-06-28 DIAGNOSIS — K8020 Calculus of gallbladder without cholecystitis without obstruction: Secondary | ICD-10-CM | POA: Diagnosis not present

## 2019-06-28 DIAGNOSIS — R3129 Other microscopic hematuria: Secondary | ICD-10-CM | POA: Diagnosis not present

## 2019-07-03 ENCOUNTER — Telehealth: Payer: Self-pay | Admitting: Internal Medicine

## 2019-07-03 NOTE — Telephone Encounter (Signed)
   Are you willing to re- establish with patient? She has not been seen since 2017. Her current PCP she has been seeing is leaving the practice (close to her home) Please advise

## 2019-07-05 ENCOUNTER — Ambulatory Visit
Admission: RE | Admit: 2019-07-05 | Discharge: 2019-07-05 | Disposition: A | Discharge status: Home / Self Care | Payer: Medicare Other | Source: Ambulatory Visit | Attending: Obstetrics and Gynecology | Admitting: Obstetrics and Gynecology

## 2019-07-05 ENCOUNTER — Other Ambulatory Visit: Payer: Self-pay

## 2019-07-05 DIAGNOSIS — Z1231 Encounter for screening mammogram for malignant neoplasm of breast: Secondary | ICD-10-CM

## 2019-07-05 NOTE — Telephone Encounter (Signed)
Ok thx.

## 2019-07-07 ENCOUNTER — Encounter: Payer: Medicare Other | Attending: Family Medicine | Admitting: Registered"

## 2019-07-07 DIAGNOSIS — R7303 Prediabetes: Secondary | ICD-10-CM

## 2019-07-10 ENCOUNTER — Ambulatory Visit (INDEPENDENT_AMBULATORY_CARE_PROVIDER_SITE_OTHER): Payer: Medicare Other | Admitting: Internal Medicine

## 2019-07-10 ENCOUNTER — Encounter: Payer: Self-pay | Admitting: Internal Medicine

## 2019-07-10 ENCOUNTER — Other Ambulatory Visit: Payer: Self-pay

## 2019-07-10 VITALS — BP 124/70 | HR 65 | Temp 98.1°F | Ht 65.0 in | Wt 202.0 lb

## 2019-07-10 DIAGNOSIS — M179 Osteoarthritis of knee, unspecified: Secondary | ICD-10-CM

## 2019-07-10 DIAGNOSIS — R319 Hematuria, unspecified: Secondary | ICD-10-CM | POA: Diagnosis not present

## 2019-07-10 DIAGNOSIS — R300 Dysuria: Secondary | ICD-10-CM

## 2019-07-10 DIAGNOSIS — Z01818 Encounter for other preprocedural examination: Secondary | ICD-10-CM

## 2019-07-10 DIAGNOSIS — E559 Vitamin D deficiency, unspecified: Secondary | ICD-10-CM

## 2019-07-10 DIAGNOSIS — R739 Hyperglycemia, unspecified: Secondary | ICD-10-CM | POA: Diagnosis not present

## 2019-07-10 DIAGNOSIS — R32 Unspecified urinary incontinence: Secondary | ICD-10-CM

## 2019-07-10 DIAGNOSIS — I10 Essential (primary) hypertension: Secondary | ICD-10-CM | POA: Diagnosis not present

## 2019-07-10 DIAGNOSIS — E785 Hyperlipidemia, unspecified: Secondary | ICD-10-CM

## 2019-07-10 DIAGNOSIS — E7849 Other hyperlipidemia: Secondary | ICD-10-CM

## 2019-07-10 MED ORDER — AMLODIPINE-OLMESARTAN 5-20 MG PO TABS: 1.0000 | ORAL_TABLET | Freq: Every day | ORAL | 3 refills | Status: DC

## 2019-07-10 MED ORDER — NORTRIPTYLINE HCL 10 MG PO CAPS: 10.0000 mg | ORAL_CAPSULE | Freq: Every day | ORAL | 5 refills | Status: DC

## 2019-07-10 NOTE — Assessment & Plan Note (Signed)
D/c HCT UA

## 2019-07-10 NOTE — Assessment & Plan Note (Signed)
CT calcium score test offered

## 2019-07-10 NOTE — Assessment & Plan Note (Signed)
UA

## 2019-07-10 NOTE — Assessment & Plan Note (Signed)
A  cardiac CT scan for calcium scoring offered 5/21 D/c HCTZ

## 2019-07-10 NOTE — Patient Instructions (Signed)

## 2019-07-10 NOTE — Assessment & Plan Note (Signed)
  Dr Noemi Chapel -  TKR 6.14.21  R>>L Chronic

## 2019-07-10 NOTE — Assessment & Plan Note (Signed)
A cardiac CT scan for calcium scoring offered 

## 2019-07-10 NOTE — Assessment & Plan Note (Addendum)
On Vit D 50000 iu weekly Rx Labs

## 2019-07-10 NOTE — Progress Notes (Signed)
Subjective:  Patient ID: Bailey [name], female    DOB: 04/03/1950  Age: 69 y.o. MRN: [id number]  CC: No chief complaint on file.   HPI   Bailey [name] presents for IM consult  Reason - preop clearance  for R TKR  Req by Bailey [name]  Hx: The pt has dyslipidemia, HTN, Vit D def - all stable. C/o R knee OA pain - she  saw Bailey [name] - surgery TKR is planned for 6.14.21. F/u OAB - seeing Bailey [name]: going 3 times at [facility]. F/u elevated lipids  Outpatient Medications Prior to Visit  Medication Sig Dispense Refill  . aspirin EC 81 MG tablet Take 1 tablet (81 mg total) by mouth daily. 100 tablet 3  . COLLAGEN PO Take by mouth. Take one scoop by mouth every morning    . ezetimibe (ZETIA) 10 MG tablet Take 1 tablet (10 mg total) by mouth at bedtime. 90 tablet 0  . Olmesartan-amLODIPine-HCTZ 20-5-12.5 MG TABS Take 1 tablet by mouth daily. 90 tablet 1  . Vitamin D, Ergocalciferol, (DRISDOL) 1.25 MG (50000 UT) CAPS capsule TAKE 1 CAPSULE BY MOUTH EVERY 7 DAYS 12 capsule 10   No facility-administered medications prior to visit.   Past Medical History:  Diagnosis Date  . Arthritis   . Arthritis of right knee   . Borderline diabetes   . Burning sensation of feet   . Colitis    hx colitis-gi bleed-2006  . Constipation   . DYSLIPIDEMIA   . Gallbladder problem   . Hx of cardiovascular stress test    Lexiscan Myoview 9/14:  Small, fixed apical anterior perfusion defect (worse at rest) - probable soft tissue attenuation, EF 61%, low risk study  . HYPERTENSION   . Lower extremity edema   . Partial tear of subscapularis tendon 04/22/2012  . POSTMENOPAUSAL STATUS   . Supraspinatus tendon tear 04/22/2012  . Tubular adenoma of colon 08/2013  . Vitamin D deficiency    Past Surgical History:  Procedure Laterality Date  . ABDOMINAL HYSTERECTOMY  1987   Partial  . APPENDECTOMY  2003  . CERVICAL FUSION  2002  . COLONOSCOPY  06/03/04   isch colitis (hosp for same)  . HERNIA REPAIR  1975   umb  . SHOULDER ARTHROSCOPY WITH ROTATOR CUFF REPAIR AND SUBACROMIAL DECOMPRESSION Right 04/22/2012   Procedure: SHOULDER ARTHROSCOPY WITH ROTATOR CUFF REPAIR AND SUBACROMIAL DECOMPRESSION;  Surgeon: Bailey Bridge, MD;  Location: [facility];  Service: Orthopedics;  Laterality: Right;  RIGHT SHOULDER ARTHROSCOPY DEBRIDEMENT LIMITED, DECOMPRESSION SUBACROMIAL PARTIAL ACROMIOPLASTY WITH CORACOACROMIAL RELEASE, WITH ROTATOR CUFF REPAIR AND SUBSCAPULARIS REPAIR  . TUBAL LIGATION      reports that she has never smoked. She has never used smokeless tobacco. She reports that she does not drink alcohol or use drugs. family history includes Aneurysm in her mother; Diabetes in her brother; Heart disease in her father; Hypertension in her father and sister; Stroke in her father, mother, and sister. Allergies  Allergen Reactions  . Codeine Nausea Only  . Hctz [Hydrochlorothiazide]     incontinence  . Statins     ROS: Review of Systems  Constitutional: Positive for unexpected weight change. Negative for activity change, appetite change, chills and fatigue.  HENT: Negative for congestion, mouth sores and sinus pressure.   Eyes: Negative for visual disturbance.  Respiratory: Negative for cough and chest tightness.   Cardiovascular: Negative for leg swelling.  Gastrointestinal: Negative for abdominal pain and nausea.  Genitourinary: Negative  for difficulty urinating, frequency and vaginal pain.  Musculoskeletal: Positive for arthralgias and gait problem. Negative for back pain.  Skin: Negative for pallor and rash.  Neurological: Negative for dizziness, tremors, weakness, numbness and headaches.  Psychiatric/Behavioral: Negative for confusion, sleep disturbance and suicidal ideas.    Objective:  BP 124/70 (BP Location: Left Arm, Patient Position: Sitting, Cuff Size: Large)   Pulse 65   Temp 98.1 F (36.7 C) (Oral)   Ht 5\' 5"  (1.651 m)   Wt 202 lb (91.6 kg)   SpO2 97%   BMI 33.61  kg/m   BP Readings from Last 3 Encounters:  07/10/19 124/70  06/26/19 133/78  05/31/19 132/79    Wt Readings from Last 3 Encounters:  07/10/19 202 lb (91.6 kg)  06/26/19 196 lb (88.9 kg)  05/31/19 196 lb (88.9 kg)    Physical Exam Constitutional:      General: She is not in acute distress.    Appearance: She is well-developed. She is obese.  HENT:     Head: Normocephalic.     Right Ear: External ear normal.     Left Ear: External ear normal.     Nose: Nose normal.  Eyes:     General:        Right eye: No discharge.        Left eye: No discharge.     Conjunctiva/sclera: Conjunctivae normal.     Pupils: Pupils are equal, round, and reactive to light.  Neck:     Thyroid: No thyromegaly.     Vascular: No JVD.     Trachea: No tracheal deviation.  Cardiovascular:     Rate and Rhythm: Normal rate and regular rhythm.     Heart sounds: Normal heart sounds.  Pulmonary:     Effort: No respiratory distress.     Breath sounds: No stridor. No wheezing.  Abdominal:     General: Bowel sounds are normal. There is no distension.     Palpations: Abdomen is soft. There is no mass.     Tenderness: There is no abdominal tenderness. There is no guarding or rebound.  Musculoskeletal:        General: Tenderness present.     Cervical back: Normal range of motion and neck supple.  Lymphadenopathy:     Cervical: No cervical adenopathy.  Skin:    Findings: No erythema or rash.  Neurological:     Cranial Nerves: No cranial nerve deficit.     Motor: No abnormal muscle tone.     Coordination: Coordination normal.     Deep Tendon Reflexes: Reflexes normal.  Psychiatric:        Behavior: Behavior normal.        Thought Content: Thought content normal.        Judgment: Judgment normal.   R knee w/pain  Lab Results  Component Value Date   WBC 6.6 08/02/2018   HGB 14.6 08/02/2018   HCT 39.2 08/02/2018   PLT 245 08/02/2018   GLUCOSE 116 (H) 05/11/2019   CHOL 154 04/04/2019   TRIG 90  04/04/2019   HDL 53 04/04/2019   LDLDIRECT 177.1 09/20/2012   LDLCALC 84 04/04/2019   ALT 19 05/11/2019   AST 19 05/11/2019   NA 138 05/11/2019   K 4.2 05/11/2019   CL 100 05/11/2019   CREATININE 0.78 05/11/2019   BUN 18 05/11/2019   CO2 25 05/11/2019   TSH 2.500 08/02/2018   HGBA1C 5.9 (H) 02/13/2019    MM 3D SCREEN  BREAST BILATERAL  Result Date: 07/05/2019 CLINICAL DATA:  Screening. EXAM: DIGITAL SCREENING BILATERAL MAMMOGRAM WITH TOMO AND CAD COMPARISON:  Previous exam(s). ACR Breast Density Category b: There are scattered areas of fibroglandular density. FINDINGS: There are no findings suspicious for malignancy. Images were processed with CAD. IMPRESSION: No mammographic evidence of malignancy. A result letter of this screening mammogram will be mailed directly to the patient. RECOMMENDATION: Screening mammogram in one year. (Code:SM-B-01Y) BI-RADS CATEGORY  1: Negative. Electronically Signed   By: Margarette Canada M.D.   On: 07/05/2019 15:43    Assessment & Plan:   There are no diagnoses linked to this encounter.   No orders of the defined types were placed in this encounter.    Follow-up: No follow-ups on file.  Walker Kehr, MD

## 2019-07-11 LAB — LIPID PANEL
Cholesterol: 220 mg/dL — ABNORMAL HIGH (ref 0–200)
HDL: 53.7 mg/dL (ref >39.00)
NonHDL: 166.71
Total CHOL/HDL Ratio: 4
Triglycerides: 202 mg/dL — ABNORMAL HIGH (ref 0.0–149.0)
VLDL: 40.4 mg/dL — ABNORMAL HIGH (ref 0.0–40.0)

## 2019-07-11 LAB — URINALYSIS
Bilirubin Urine: NEGATIVE
Ketones, ur: NEGATIVE
Leukocytes,Ua: NEGATIVE
Nitrite: NEGATIVE
Specific Gravity, Urine: 1.01 (ref 1.000–1.030)
Total Protein, Urine: NEGATIVE
Urine Glucose: NEGATIVE
Urobilinogen, UA: 0.2 (ref 0.0–1.0)
pH: 6 (ref 5.0–8.0)

## 2019-07-11 LAB — BASIC METABOLIC PANEL
BUN: 14 mg/dL (ref 6–23)
CO2: 32 mEq/L (ref 19–32)
Calcium: 10.4 mg/dL (ref 8.4–10.5)
Chloride: 98 mEq/L (ref 96–112)
Creatinine, Ser: 0.74 mg/dL (ref 0.40–1.20)
GFR: 77.83 mL/min (ref >60.00)
Glucose, Bld: 138 mg/dL — ABNORMAL HIGH (ref 70–99)
Potassium: 3.7 mEq/L (ref 3.5–5.1)
Sodium: 139 mEq/L (ref 135–145)

## 2019-07-11 LAB — HEMOGLOBIN A1C: Hgb A1c MFr Bld: 5.8 % (ref 4.6–6.5)

## 2019-07-11 LAB — VITAMIN D 25 HYDROXY (VIT D DEFICIENCY, FRACTURES): VITD: 68.96 ng/mL (ref 30.00–100.00)

## 2019-07-11 LAB — LDL CHOLESTEROL, DIRECT: Direct LDL: 132 mg/dL

## 2019-07-12 ENCOUNTER — Ambulatory Visit: Payer: Medicare Other

## 2019-07-13 ENCOUNTER — Encounter: Payer: Self-pay | Admitting: Registered"

## 2019-07-13 NOTE — Progress Notes (Signed)
On 07/07/19 pt completed a post core session of the Diabetes Prevention Program course virtually with [facility]. By the end of this session patients are able to complete the following objectives:   Learning Objectives:  Define fiber and describe the difference between insoluble and soluble fiber   List foods that are good sources of fiber  Explain the health benefits of fiber   Describe ways to increase volume of meals and snacks while staying within fat goal.   Goals:   Record weight taken outside of class.   Track foods and beverages eaten each day in the "Food and Activity Tracker," including calories and fat grams for each item.    Track activity type, minutes you were active, and distance you reached each day in the "Food and Activity Tracker."   Follow-Up Plan:  Attend next session.   Email completed "Food and Activity Trackers" before next session to be reviewed by Lifestyle Coach.

## 2019-07-14 ENCOUNTER — Encounter: Payer: Self-pay | Admitting: Registered"

## 2019-07-14 ENCOUNTER — Encounter (HOSPITAL_BASED_OUTPATIENT_CLINIC_OR_DEPARTMENT_OTHER): Payer: Medicare Other | Admitting: Registered"

## 2019-07-14 DIAGNOSIS — R7303 Prediabetes: Secondary | ICD-10-CM

## 2019-07-14 NOTE — Progress Notes (Signed)
On 07/14/19 pt completed a post-core session of the Diabetes Prevention Program course virtually with [facility]. By the end of this session patients are able to complete the following objectives:   Learning Objectives:  Describe how to incorporate more fruits and vegetables into meals.  List criteria for selecting good quality fruits and vegetables at the store.   Define mindful eating.  List the benefits of eating mindfully.   Goals:   Record weight taken outside of class.   Track foods and beverages eaten each day in the "Food and Activity Tracker," including calories and fat grams for each item.    Track activity type, minutes you were active, and distance you reached each day in the "Food and Activity Tracker."   Follow-Up Plan:  Attend next session.   Email completed "Food and Activity Trackers" before next session to be reviewed by Lifestyle Coach.

## 2019-07-17 ENCOUNTER — Encounter (INDEPENDENT_AMBULATORY_CARE_PROVIDER_SITE_OTHER): Payer: Self-pay | Admitting: Family Medicine

## 2019-07-17 ENCOUNTER — Other Ambulatory Visit: Payer: Self-pay | Admitting: Family Medicine

## 2019-07-17 ENCOUNTER — Other Ambulatory Visit: Payer: Self-pay

## 2019-07-17 ENCOUNTER — Ambulatory Visit (INDEPENDENT_AMBULATORY_CARE_PROVIDER_SITE_OTHER): Payer: Medicare Other | Admitting: Family Medicine

## 2019-07-17 VITALS — BP 155/77 | HR 54 | Temp 97.8°F | Ht 65.0 in | Wt 198.0 lb

## 2019-07-17 DIAGNOSIS — M25561 Pain in right knee: Secondary | ICD-10-CM

## 2019-07-17 DIAGNOSIS — Z6833 Body mass index (BMI) 33.0-33.9, adult: Secondary | ICD-10-CM

## 2019-07-17 DIAGNOSIS — G8929 Other chronic pain: Secondary | ICD-10-CM | POA: Diagnosis not present

## 2019-07-17 DIAGNOSIS — E7849 Other hyperlipidemia: Secondary | ICD-10-CM | POA: Diagnosis not present

## 2019-07-17 DIAGNOSIS — E66811 Obesity, class 1: Secondary | ICD-10-CM

## 2019-07-17 DIAGNOSIS — E669 Obesity, unspecified: Secondary | ICD-10-CM

## 2019-07-17 DIAGNOSIS — E785 Hyperlipidemia, unspecified: Secondary | ICD-10-CM

## 2019-07-17 NOTE — Progress Notes (Signed)
Chief Complaint:   OBESITY Bailey [name] is here to discuss her progress with her obesity treatment plan along with follow-up of her obesity related diagnoses. Bailey [name] is on the Category 2 Plan and states she is following her eating plan approximately 40-50% of the time. Bailey [name] states she is exercising for 0 minutes 0 times per week.  Today's visit was #: 5 Starting weight: 196 lbs Starting date: 04/27/2019 Today's weight: 198 lbs Today's date: 07/17/2019 Total lbs lost to date: 0 Total lbs lost since last in-office visit: 0  Interim History: Bailey [name] has stopped taking HCTZ.  She has been prescribed nortriptyline flor bladder spasms and nocturia, but she has not started taking it yet.  She has knee surgery scheduled for June and would like to pause care at [facility]  Until after.  Subjective:   1. Chronic pain of right knee Bailey [name] has an upcoming TKR.    2. Other hyperlipidemia Bailey [name] has hyperlipidemia and has been trying to improve her cholesterol levels with intensive lifestyle modification including a low saturated fat diet, exercise and weight loss. She denies any chest pain, claudication or myalgias.  Triglycerides were elevated at last draw, but she was not fasting.  Lab Results  Component Value Date   ALT 19 05/11/2019   AST 19 05/11/2019   ALKPHOS 70 05/11/2019   BILITOT 0.6 05/11/2019   Lab Results  Component Value Date   CHOL 220 (H) 07/10/2019   HDL 53.70 07/10/2019   LDLCALC 84 04/04/2019   LDLDIRECT 132.0 07/10/2019   TRIG 202.0 (H) 07/10/2019   CHOLHDL 4 07/10/2019   Assessment/Plan:   1. Chronic pain of right knee We discussed range of motion and importance of nutrition status post surgery.  2. Other hyperlipidemia Cardiovascular risk and specific lipid/LDL goals reviewed.  We discussed several lifestyle modifications today and Bailey [name] will continue to work on diet, exercise and weight loss efforts. Orders and follow up as documented in patient record.    Counseling Intensive lifestyle modifications are the first line treatment for this issue. . Dietary changes: Increase soluble fiber. Decrease simple carbohydrates. . Exercise changes: Moderate to vigorous-intensity aerobic activity 150 minutes per week if tolerated. . Lipid-lowering medications: see documented in medical record.  3. Class 1 obesity with serious comorbidity and body mass index (BMI) of 33.0 to 33.9 in adult, unspecified obesity type Bailey [name] is currently in the action stage of change. As such, her goal is to continue with weight loss efforts. She has agreed to the Category 2 Plan.   Exercise goals: All adults should avoid inactivity. Some physical activity is better than none, and adults who participate in any amount of physical activity gain some health benefits.  Behavioral modification strategies: increasing lean protein intake and increasing water intake.  Bailey [name] has agreed to follow-up with our clinic as needed. She was informed of the importance of frequent follow-up visits to maximize her success with intensive lifestyle modifications for her multiple health conditions.   Objective:   Blood pressure (!) 155/77, pulse (!) 54, temperature 97.8 F (36.6 C), temperature source Oral, height 5\' 5"  (1.651 m), weight 198 lb (89.8 kg), SpO2 99 %. Body mass index is 32.95 kg/m.  General: Cooperative, alert, well developed, in no acute distress. HEENT: Conjunctivae and lids unremarkable. Cardiovascular: Regular rhythm.  Lungs: Normal work of breathing. Neurologic: No focal deficits.   Lab Results  Component Value Date   CREATININE 0.74 07/10/2019   BUN 14 07/10/2019   NA 139 07/10/2019  K 3.7 07/10/2019   CL 98 07/10/2019   CO2 32 07/10/2019   Lab Results  Component Value Date   ALT 19 05/11/2019   AST 19 05/11/2019   ALKPHOS 70 05/11/2019   BILITOT 0.6 05/11/2019   Lab Results  Component Value Date   HGBA1C 5.8 07/10/2019   HGBA1C 5.9 (H) 02/13/2019    HGBA1C 5.8 (H) 08/02/2018   HGBA1C 5.4 12/17/2016   Lab Results  Component Value Date   TSH 2.500 08/02/2018   Lab Results  Component Value Date   CHOL 220 (H) 07/10/2019   HDL 53.70 07/10/2019   LDLCALC 84 04/04/2019   LDLDIRECT 132.0 07/10/2019   TRIG 202.0 (H) 07/10/2019   CHOLHDL 4 07/10/2019   Lab Results  Component Value Date   WBC 6.6 08/02/2018   HGB 14.6 08/02/2018   HCT 39.2 08/02/2018   MCV 91 08/02/2018   PLT 245 08/02/2018   Attestation Statements:   Reviewed by clinician on day of visit: allergies, medications, problem list, medical history, surgical history, family history, social history, and previous encounter notes.  Time spent on visit including pre-visit chart review and post-visit care and charting was 27 minutes.   I, Water quality scientist, CMA, am acting as Location manager for [employer], DO.  I have reviewed the above documentation for accuracy and completeness, and I agree with the above. Briscoe Deutscher, DO

## 2019-07-18 DIAGNOSIS — Z01818 Encounter for other preprocedural examination: Secondary | ICD-10-CM | POA: Insufficient documentation

## 2019-07-18 NOTE — Assessment & Plan Note (Signed)
The pt is medically clear for R TKR surgery. Peri op complication risk is average. Please use your routine peri-op protocols. Thank you, AP

## 2019-07-20 DIAGNOSIS — R3121 Asymptomatic microscopic hematuria: Secondary | ICD-10-CM | POA: Diagnosis not present

## 2019-07-25 ENCOUNTER — Telehealth: Payer: Self-pay | Admitting: Internal Medicine

## 2019-07-25 NOTE — Telephone Encounter (Signed)
I called pt- she c/o ankle edema since she recently stopped HCTZ. She wants to know if she should wait a while and try elevating her legs. She states her overactive bladder has gotten some better.  She has been trying to get things done and up on her feet a little more prior to her upcoming surgery. Please advise.

## 2019-07-25 NOTE — Telephone Encounter (Signed)
New Message:    Pt is calling in reference to her BP and she has some general questions about it. Please advise.

## 2019-07-25 NOTE — Telephone Encounter (Signed)
Please observe for another couple weeks.  Let me know if swelling is bothersome.  Thanks

## 2019-07-26 NOTE — Telephone Encounter (Signed)
Pt informed of below.  

## 2019-08-01 DIAGNOSIS — M1711 Unilateral primary osteoarthritis, right knee: Secondary | ICD-10-CM | POA: Diagnosis not present

## 2019-08-01 NOTE — H&P (Signed)
TOTAL KNEE ADMISSION H&P  Patient is being admitted for right total knee arthroplasty.  Subjective:  Chief Complaint:right knee pain.  HPI: Bailey [name], 69 y.o. female, has a history of pain and functional disability in the right knee due to arthritis and has failed non-surgical conservative treatments for greater than 12 weeks to includeNSAID's and/or analgesics, corticosteriod injections, viscosupplementation injections, flexibility and strengthening excercises, use of assistive devices, weight reduction as appropriate and activity modification.  Onset of symptoms was gradual, starting 10 years ago with gradually worsening course since that time. The patient noted prior procedures on the knee to include  arthroscopy and menisectomy on the right knee(s).  Patient currently rates pain in the right knee(s) at 10 out of 10 with activity. Patient has night pain, worsening of pain with activity and weight bearing, pain that interferes with activities of daily living, crepitus and joint swelling.  Patient has evidence of subchondral sclerosis, periarticular osteophytes and joint space narrowing by imaging studies.  There is no active infection.  Patient Active Problem List   Diagnosis Date Noted  . Preop exam for internal medicine 07/18/2019  . Increased urinary frequency 04/06/2019  . Statin declined 02/15/2019  . Nocturia more than twice per night 02/15/2019  . Statin intolerance-   muscle cramps 02/15/2019  . Prediabetes 08/17/2018  . Incontinence in female 07/27/2018  . Nocturia-chronic 07/27/2018  . Actinic keratoses 09/30/2017  . h/o Intolerance of drug- statin 07/14/2017  . 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 Inactivity 07/14/2017  . Dysuria 06/23/2017  . Abnormal urinalysis 06/23/2017  . Chronic pain of right knee 12/21/2016  . Abnormality of heart beat-  08/17/2016  . Acute reaction to situational stress 08/17/2016  . GAD (generalized anxiety  disorder) 08/17/2016  . Hematuria 03/13/2016  . Pelvic pressure in female 03/13/2016  . Hx of cardiovascular stress test 12/22/2015  . Vitamin D deficiency 12/22/2015  . Generalized OA 10/29/2014  . Primary localized osteoarthritis of right knee 10/29/2014  . Chronic Fatigue 07/27/2014  . Chronic constipation- sees GI 07/27/2014  . Rectus diastasis 08/09/2013  . Obesity (BMI 30-39.9) 06/22/2013  . Obesity 06/22/2013  . Gallstone 07/27/2012  . Acute diverticulitis 07/27/2012  . Partial tear of subscapularis tendon 04/22/2012  . Supraspinatus tendon tear 04/22/2012  . Trochanteric bursitis of right hip   . Asymptomatic postmenopausal status 01/04/2009  . Dyslipidemia 09/27/2008  . Hyperlipidemia 09/27/2008  . Essential hypertension 06/28/2008   Past Medical History:  Diagnosis Date  . Arthritis   . Arthritis of right knee   . Borderline diabetes   . Burning sensation of feet   . Colitis    hx colitis-gi bleed-2006  . Constipation   . DYSLIPIDEMIA   . Gallbladder problem   . Hx of cardiovascular stress test    Lexiscan Myoview 9/14:  Small, fixed apical anterior perfusion defect (worse at rest) - probable soft tissue attenuation, EF 61%, low risk study  . HYPERTENSION   . Lower extremity edema   . Partial tear of subscapularis tendon 04/22/2012  . POSTMENOPAUSAL STATUS   . Supraspinatus tendon tear 04/22/2012  . Tubular adenoma of colon 08/2013  . Vitamin D deficiency     Past Surgical History:  Procedure Laterality Date  . ABDOMINAL HYSTERECTOMY  1987   Partial  . APPENDECTOMY  2003  . CERVICAL FUSION  2002  . COLONOSCOPY  06/03/04   isch colitis (hosp for same)  . [place]  umb  . SHOULDER ARTHROSCOPY WITH ROTATOR CUFF REPAIR AND SUBACROMIAL DECOMPRESSION Right 04/22/2012   Procedure: SHOULDER ARTHROSCOPY WITH ROTATOR CUFF REPAIR AND SUBACROMIAL DECOMPRESSION;  Surgeon: Johnny Bridge, MD;  Location: [facility];  Service: Orthopedics;   Laterality: Right;  RIGHT SHOULDER ARTHROSCOPY DEBRIDEMENT LIMITED, DECOMPRESSION SUBACROMIAL PARTIAL ACROMIOPLASTY WITH CORACOACROMIAL RELEASE, WITH ROTATOR CUFF REPAIR AND SUBSCAPULARIS REPAIR  . TUBAL LIGATION      No current facility-administered medications for this encounter.   Current Outpatient Medications  Medication Sig Dispense Refill Last Dose  . amLODipine-olmesartan (AZOR) 5-20 MG tablet Take 1 tablet by mouth daily. 90 tablet 3   . aspirin EC 81 MG tablet Take 1 tablet (81 mg total) by mouth daily. (Patient taking differently: Take 81 mg by mouth at bedtime. ) 100 tablet 3   . COLLAGEN PO Take 1 Scoop by mouth daily.      Marland Kitchen ezetimibe (ZETIA) 10 MG tablet Take 1 tablet (10 mg total) by mouth at bedtime. 90 tablet 0   . Vitamin D, Ergocalciferol, (DRISDOL) 1.25 MG (50000 UT) CAPS capsule TAKE 1 CAPSULE BY MOUTH EVERY 7 DAYS (Patient taking differently: Take 50,000 Units by mouth every 7 (seven) days. Wednesday) 12 capsule 10   . nortriptyline (PAMELOR) 10 MG capsule Take 1 capsule (10 mg total) by mouth at bedtime. Use for your bladder problem. 30 capsule 5    Allergies  Allergen Reactions  . Codeine Nausea Only  . Hctz [Hydrochlorothiazide]     incontinence  . Statins     Leg cramps    Social History   Tobacco Use  . Smoking status: Never Smoker  . Smokeless tobacco: Never Used  . Tobacco comment: exposed to second hand (spouse smokes), Married  Substance Use Topics  . Alcohol use: No    Family History  Problem Relation Age of Onset  . Hypertension Father   . Stroke Father   . Heart disease Father   . Aneurysm Mother        brain  . Stroke Mother   . Stroke Sister   . Hypertension Sister   . Diabetes Brother   . Colon cancer Neg Hx   . Esophageal cancer Neg Hx   . Rectal cancer Neg Hx   . Stomach cancer Neg Hx      Review of Systems  Constitutional: Negative.   HENT: Negative.   Eyes: Negative.   Respiratory: Negative.   Cardiovascular: Negative.    Gastrointestinal: Negative.   Endocrine: Negative.   Genitourinary: Negative.   Musculoskeletal: Positive for arthralgias, back pain, gait problem and joint swelling.  Skin: Negative.   Allergic/Immunologic: Negative.   Hematological: Negative.   Psychiatric/Behavioral: Negative.     Objective:  Physical Exam  Constitutional: She is oriented to person, place, and time. She appears well-developed and well-nourished.  HENT:  Head: Normocephalic.  Mouth/Throat: Oropharynx is clear and moist.  Eyes: Pupils are equal, round, and reactive to light. Conjunctivae are normal.  Cardiovascular: Normal rate.  Respiratory: Effort normal.  GI: Soft.  Genitourinary:    Genitourinary Comments: Not pertinent to current symptomatology therefore not examined.   Musculoskeletal:     Cervical back: Neck supple.     Comments: Examination of her right knee reveals pain medially and laterally.  Moderate varus deformity.  Range of motion -5-120 degrees. 2+ crepitus  2+synovitis. Knee is stable with normal patella tracking.  Examination of the left knee reveals full range of motion with mild to moderate pain  and swelling, no weakness or instability.    Neurological: She is alert and oriented to person, place, and time.  Skin: Skin is warm and dry.  Psychiatric: She has a normal mood and affect. Her behavior is normal.    Vital signs in last 24 hours:    Labs:   Estimated body mass index is 32.95 kg/m as calculated from the following:   Height as of 07/17/19: 5\' 5"  (1.651 m).   Weight as of 07/17/19: 89.8 kg.   Imaging Review Plain radiographs demonstrate severe degenerative joint disease of the right knee(s). The overall alignment issignificant varus. The bone quality appears to be good for age and reported activity level.      Assessment/Plan:  End stage arthritis, right knee   The patient history, physical examination, clinical judgment of the provider and imaging studies are consistent  with end stage degenerative joint disease of the right knee(s) and total knee arthroplasty is deemed medically necessary. The treatment options including medical management, injection therapy arthroscopy and arthroplasty were discussed at length. The risks and benefits of total knee arthroplasty were presented and reviewed. The risks due to aseptic loosening, infection, stiffness, patella tracking problems, thromboembolic complications and other imponderables were discussed. The patient acknowledged the explanation, agreed to proceed with the plan and consent was signed. Patient is being admitted for inpatient treatment for surgery, pain control, PT, OT, prophylactic antibiotics, VTE prophylaxis, progressive ambulation and ADL's and discharge planning. The patient is planning to be discharged home with home health services     Patient's anticipated LOS is less than 2 midnights, meeting these requirements: - Younger than 37 - Lives within 1 hour of care - Has a competent adult at home to recover with post-op recover - NO history of  - Chronic pain requiring opiods  - Diabetes  - Coronary Artery Disease  - Heart failure  - Heart attack  - Stroke  - DVT/VTE  - Cardiac arrhythmia  - Respiratory Failure/COPD  - Renal failure  - Anemia  - Advanced Liver disease

## 2019-08-02 NOTE — Care Plan (Signed)
Ortho Bundle Case Management Note  Patient Details  Name: Bailey [name] MRN: [id number] Date of Birth: 07/14/1950     Patient seen in the office prior to surgery. She plans to discharge to home with family. Rolling walker and CPM ordered for home use. HHPT referral to [facility] and OPPT set up at [facility].  Patient and MD in agreement with plan and choice offered                 DME Arranged:  Walker rolling, CPM DME Agency:  [employer]  HH Arranged:  PT [place] Agency:  [facility] (formerly [facility])  Additional Comments: Please contact me with any questions of if this plan should need to change.  Ladell Heads,  [place] [facility]  [phone] 08/02/2019, 10:38 AM

## 2019-08-04 ENCOUNTER — Encounter (HOSPITAL_COMMUNITY): Payer: Self-pay

## 2019-08-04 ENCOUNTER — Encounter: Payer: Self-pay | Admitting: Registered"

## 2019-08-04 ENCOUNTER — Encounter: Payer: Medicare Other | Attending: Family Medicine | Admitting: Registered"

## 2019-08-04 DIAGNOSIS — R7303 Prediabetes: Secondary | ICD-10-CM | POA: Insufficient documentation

## 2019-08-04 NOTE — Progress Notes (Signed)
On 08/04/19 patient completed a Post Core Session of the Diabetes Prevention Program course virtually with [facility]. The following learning objectives were met by the patient during this class:    Learning Objectives:  List past challenges to making positive lifestyle changes.   Describe strategies for positive lifestyle change.   Make a plan for maintaining positive lifestyle changes and continuing toward goals after the program ends.  Goals:   Record weight taken outside of class.   Track foods and beverages eaten each day in the "Food and Activity Tracker," including calories and fat grams for each item.   Track physical activity minutes in the "Food and Activity Tracker."   Complete future healthy lifestyle changes plan.   Follow-Up Plan: . Attend next post core session  . Email completed "Food and Activity Tracker" before next session to be reviewed by Lifestyle Coach.   

## 2019-08-04 NOTE — Progress Notes (Signed)
PCP - Tyrone Apple Plotnikovlov 07-10-19 clearance note  epic Cardiologist -   PPM/ICD -  Device Orders -  Rep Notified -   Chest x-ray -  EKG - 04-27-19 epic Stress Test -  ECHO -  Cardiac Cath -  hgba1c 07-10-19 5.8 epic  Sleep Study -  CPAP -   Fasting Blood Sugar -  Checks Blood Sugar _____ times a day  Blood Thinner Instructions: Aspirin Instructions:  ERAS Protcol - PRE-SURGERY  G2-   COVID TEST- 6-10   Anesthesia review:   Patient denies shortness of breath, fever, cough and chest pain at PAT appointment  none  All instructions explained to the patient, with a verbal understanding of the material. Patient agrees to go over the instructions while at home for a better understanding. Patient also instructed to self quarantine after being tested for COVID-19. The opportunity to ask questions was provided.

## 2019-08-04 NOTE — Patient Instructions (Addendum)
DUE TO COVID-19 ONLY ONE VISITOR IS ALLOWED TO COME WITH YOU AND STAY IN THE WAITING ROOM ONLY DURING PRE OP AND PROCEDURE DAY OF SURGERY. TWO VISITOR MAY VISIT WITH YOU AFTER SURGERY IN YOUR PRIVATE ROOM DURING VISITING HOURS ONLY! 10a-8pm  YOU NEED TO HAVE A COVID 19 TEST ON__6-10-21_____ @__11 :05_____, THIS TEST MUST BE DONE BEFORE SURGERY, COME  [address], [place] , [address].  ([place]) ONCE YOUR COVID TEST IS COMPLETED, PLEASE BEGIN THE QUARANTINE INSTRUCTIONS AS OUTLINED IN YOUR HANDOUT.                JULIANNA VANWAGNER  08/04/2019   Your procedure is scheduled on: 08-14-19   Report to [facility] Main  Entrance   Report to short stay  at      0530 AM     Call this number if you have problems the morning of surgery [phone]    Remember: NO SOLID FOOD AFTER MIDNIGHT THE NIGHT PRIOR TO SURGERY. NOTHING BY MOUTH EXCEPT CLEAR LIQUIDS UNTIL   0415am . PLEASE FINISH G2  DRINK PER SURGEON ORDER  WHICH NEEDS TO BE COMPLETED AT    9169 am then nothing by mouth.    CLEAR LIQUID DIET   Foods Allowed                                                                                        Foods Excluded  Coffee and tea, regular and decaf  NO creamer                              liquids that you cannot  Plain Jell-O any favor except red or purple                                           see through such as: Fruit ices (not with fruit pulp)                                                            milk, soups, orange juice  Iced Popsicles                                                            All solid food Carbonated beverages, regular and diet                                                 Cranberry, grape and apple juices Sports  drinks like Gatorade Lightly seasoned clear broth or consume(fat free) Sugar, honey syrup _____________________________________________________________________    BRUSH YOUR TEETH MORNING OF SURGERY AND RINSE YOUR  MOUTH OUT, NO CHEWING GUM CANDY OR MINTS.     Take these medicines the morning of surgery with A SIP OF WATER: none                                 You may not have any metal on your body including hair pins and              piercings  Do not wear jewelry, make-up, lotions, powders or perfumes, deodorant             Do not wear nail polish on your fingernails.  Do not shave  48 hours prior to surgery.     Do not bring valuables to the hospital. [place].  Contacts, dentures or bridgework may not be worn into surgery.      Patients discharged the day of surgery will not be allowed to drive home. IF YOU ARE HAVING SURGERY AND GOING HOME THE SAME DAY, YOU MUST HAVE AN ADULT TO DRIVE YOU HOME AND BE WITH YOU FOR 24 HOURS. YOU MAY GO HOME BY TAXI OR UBER OR ORTHERWISE, BUT AN ADULT MUST ACCOMPANY YOU HOME AND STAY WITH YOU FOR 24 HOURS.  Name and phone number of your driver:  Special Instructions: N/A              Please read over the following fact sheets you were given: _____________________________________________________________________             [facility] - Preparing for Surgery Before surgery, you can play an important role.  Because skin is not sterile, your skin needs to be as free of germs as possible.  You can reduce the number of germs on your skin by washing with CHG (chlorahexidine gluconate) soap before surgery.  CHG is an antiseptic cleaner which kills germs and bonds with the skin to continue killing germs even after washing. Please DO NOT use if you have an allergy to CHG or antibacterial soaps.  If your skin becomes reddened/irritated stop using the CHG and inform your nurse when you arrive at Short Stay. Do not shave (including legs and underarms) for at least 48 hours prior to the first CHG shower.  You may shave your face/neck. Please follow these instructions carefully:  1.  Shower with CHG Soap the night before  surgery and the  morning of Surgery.  2.  If you choose to wash your hair, wash your hair first as usual with your  normal  shampoo.  3.  After you shampoo, rinse your hair and body thoroughly to remove the  shampoo.                           4.  Use CHG as you would any other liquid soap.  You can apply chg directly  to the skin and wash                       Gently with a scrungie or clean washcloth.  5.  Apply the CHG Soap to your body ONLY FROM THE NECK DOWN.   Do  not use on face/ open                           Wound or open sores. Avoid contact with eyes, ears mouth and genitals (private parts).                       Wash face,  Genitals (private parts) with your normal soap.             6.  Wash thoroughly, paying special attention to the area where your surgery  will be performed.  7.  Thoroughly rinse your body with warm water from the neck down.  8.  DO NOT shower/wash with your normal soap after using and rinsing off  the CHG Soap.                9.  Pat yourself dry with a clean towel.            10.  Wear clean pajamas.            11.  Place clean sheets on your bed the night of your first shower and do not  sleep with pets. Day of Surgery : Do not apply any lotions/deodorants the morning of surgery.  Please wear clean clothes to the hospital/surgery center.  FAILURE TO FOLLOW THESE INSTRUCTIONS MAY RESULT IN THE CANCELLATION OF YOUR SURGERY PATIENT SIGNATURE_________________________________  NURSE SIGNATURE__________________________________  ________________________________________________________________________   Adam Phenix  An incentive spirometer is a tool that can help keep your lungs clear and active. This tool measures how well you are filling your lungs with each breath. Taking long deep breaths may help reverse or decrease the chance of developing breathing (pulmonary) problems (especially infection) following:  A long period of time when you are unable to  move or be active. BEFORE THE PROCEDURE   If the spirometer includes an indicator to show your best effort, your nurse or respiratory therapist will set it to a desired goal.  If possible, sit up straight or lean slightly forward. Try not to slouch.  Hold the incentive spirometer in an upright position. INSTRUCTIONS FOR USE  1. Sit on the edge of your bed if possible, or sit up as far as you can in bed or on a chair. 2. Hold the incentive spirometer in an upright position. 3. Breathe out normally. 4. Place the mouthpiece in your mouth and seal your lips tightly around it. 5. Breathe in slowly and as deeply as possible, raising the piston or the ball toward the top of the column. 6. Hold your breath for 3-5 seconds or for as long as possible. Allow the piston or ball to fall to the bottom of the column. 7. Remove the mouthpiece from your mouth and breathe out normally. 8. Rest for a few seconds and repeat Steps 1 through 7 at least 10 times every 1-2 hours when you are awake. Take your time and take a few normal breaths between deep breaths. 9. The spirometer may include an indicator to show your best effort. Use the indicator as a goal to work toward during each repetition. 10. After each set of 10 deep breaths, practice coughing to be sure your lungs are clear. If you have an incision (the cut made at the time of surgery), support your incision when coughing by placing a pillow or rolled up towels firmly against it. Once you are able to get out of bed,  walk around indoors and cough well. You may stop using the incentive spirometer when instructed by your caregiver.  RISKS AND COMPLICATIONS  Take your time so you do not get dizzy or light-headed.  If you are in pain, you may need to take or ask for pain medication before doing incentive spirometry. It is harder to take a deep breath if you are having pain. AFTER USE  Rest and breathe slowly and easily.  It can be helpful to keep track of  a log of your progress. Your caregiver can provide you with a simple table to help with this. If you are using the spirometer at home, follow these instructions: [place] IF:   You are having difficultly using the spirometer.  You have trouble using the spirometer as often as instructed.  Your pain medication is not giving enough relief while using the spirometer.  You develop fever of 100.5 F (38.1 C) or higher. SEEK IMMEDIATE MEDICAL CARE IF:   You cough up bloody sputum that had not been present before.  You develop fever of 102 F (38.9 C) or greater.  You develop worsening pain at or near the incision site. MAKE SURE YOU:   Understand these instructions.  Will watch your condition.  Will get help right away if you are not doing well or get worse. Document Released: 06/29/2006 Document Revised: 05/11/2011 Document Reviewed: 08/30/2006 [facility] Patient Information 2014 [place], [place].   ________________________________________________________________________

## 2019-08-07 ENCOUNTER — Encounter (HOSPITAL_COMMUNITY): Payer: Self-pay

## 2019-08-07 ENCOUNTER — Other Ambulatory Visit: Payer: Self-pay

## 2019-08-07 ENCOUNTER — Encounter (HOSPITAL_COMMUNITY)
Admission: RE | Admit: 2019-08-07 | Discharge: 2019-08-07 | Disposition: A | Discharge status: Home / Self Care | Payer: Medicare Other | Source: Ambulatory Visit | Attending: Orthopedic Surgery | Admitting: Orthopedic Surgery

## 2019-08-07 ENCOUNTER — Ambulatory Visit: Payer: Medicare Other | Admitting: Physician Assistant

## 2019-08-07 DIAGNOSIS — Z01812 Encounter for preprocedural laboratory examination: Secondary | ICD-10-CM | POA: Diagnosis not present

## 2019-08-07 HISTORY — DX: Headache, unspecified: R51.9

## 2019-08-07 HISTORY — DX: Prediabetes: R73.03

## 2019-08-07 LAB — CBC WITH DIFFERENTIAL/PLATELET
Abs Immature Granulocytes: 0.03 10*3/uL (ref 0.00–0.07)
Basophils Absolute: 0 10*3/uL (ref 0.0–0.1)
Basophils Relative: 0 %
Eosinophils Absolute: 0.1 10*3/uL (ref 0.0–0.5)
Eosinophils Relative: 1 %
HCT: 43.6 % (ref 36.0–46.0)
Hemoglobin: 14.4 g/dL (ref 12.0–15.0)
Immature Granulocytes: 0 %
Lymphocytes Relative: 36 %
Lymphs Abs: 2.6 10*3/uL (ref 0.7–4.0)
MCH: 32.1 pg (ref 26.0–34.0)
MCHC: 33 g/dL (ref 30.0–36.0)
MCV: 97.1 fL (ref 80.0–100.0)
Monocytes Absolute: 0.6 10*3/uL (ref 0.1–1.0)
Monocytes Relative: 8 %
Neutro Abs: 3.9 10*3/uL (ref 1.7–7.7)
Neutrophils Relative %: 55 %
Platelets: 258 10*3/uL (ref 150–400)
RBC: 4.49 MIL/uL (ref 3.87–5.11)
RDW: 13 % (ref 11.5–15.5)
WBC: 7.2 10*3/uL (ref 4.0–10.5)
nRBC: 0 % (ref 0.0–0.2)

## 2019-08-07 LAB — COMPREHENSIVE METABOLIC PANEL
ALT: 22 U/L (ref 0–44)
AST: 21 U/L (ref 15–41)
Albumin: 4.5 g/dL (ref 3.5–5.0)
Alkaline Phosphatase: 60 U/L (ref 38–126)
Anion gap: 9 (ref 5–15)
BUN: 11 mg/dL (ref 8–23)
CO2: 28 mmol/L (ref 22–32)
Calcium: 10.1 mg/dL (ref 8.9–10.3)
Chloride: 104 mmol/L (ref 98–111)
Creatinine, Ser: 0.7 mg/dL (ref 0.44–1.00)
GFR calc Af Amer: >60 mL/min (ref >60)
GFR calc non Af Amer: >60 mL/min (ref >60)
Glucose, Bld: 106 mg/dL — ABNORMAL HIGH (ref 70–99)
Potassium: 4.7 mmol/L (ref 3.5–5.1)
Sodium: 141 mmol/L (ref 135–145)
Total Bilirubin: 1.2 mg/dL (ref 0.3–1.2)
Total Protein: 7.9 g/dL (ref 6.5–8.1)

## 2019-08-07 LAB — SURGICAL PCR SCREEN
MRSA, PCR: NEGATIVE
Staphylococcus aureus: NEGATIVE

## 2019-08-07 LAB — APTT: aPTT: 31 seconds (ref 24–36)

## 2019-08-07 LAB — PROTIME-INR
INR: 1 (ref 0.8–1.2)
Prothrombin Time: 12.6 seconds (ref 11.4–15.2)

## 2019-08-08 LAB — URINE CULTURE: Culture: <10000 — AB

## 2019-08-10 ENCOUNTER — Other Ambulatory Visit (HOSPITAL_COMMUNITY)
Admission: RE | Admit: 2019-08-10 | Discharge: 2019-08-10 | Disposition: A | Discharge status: Home / Self Care | Payer: Medicare Other | Source: Ambulatory Visit | Attending: Orthopedic Surgery | Admitting: Orthopedic Surgery

## 2019-08-10 DIAGNOSIS — Z20822 Contact with and (suspected) exposure to covid-19: Secondary | ICD-10-CM | POA: Diagnosis not present

## 2019-08-10 DIAGNOSIS — Z01812 Encounter for preprocedural laboratory examination: Secondary | ICD-10-CM | POA: Insufficient documentation

## 2019-08-10 LAB — SARS CORONAVIRUS 2 (TAT 6-24 HRS): SARS Coronavirus 2: NEGATIVE

## 2019-08-13 MED ORDER — BUPIVACAINE LIPOSOME 1.3 % IJ SUSP
20.0000 mL | INTRAMUSCULAR | Status: DC
  Filled 2019-08-13: qty 20

## 2019-08-14 ENCOUNTER — Other Ambulatory Visit: Payer: Self-pay

## 2019-08-14 ENCOUNTER — Observation Stay (HOSPITAL_COMMUNITY)
Admission: RE | Admit: 2019-08-14 | Discharge: 2019-08-15 | Disposition: A | Discharge status: Home / Self Care | Payer: Medicare Other | Attending: Orthopedic Surgery | Admitting: Orthopedic Surgery

## 2019-08-14 ENCOUNTER — Encounter (HOSPITAL_COMMUNITY): Payer: Self-pay | Admitting: Orthopedic Surgery

## 2019-08-14 ENCOUNTER — Ambulatory Visit (HOSPITAL_COMMUNITY): Payer: Medicare Other | Admitting: Certified Registered Nurse Anesthetist

## 2019-08-14 ENCOUNTER — Encounter (HOSPITAL_COMMUNITY)
Admission: RE | Disposition: A | Discharge status: Home / Self Care | Payer: Self-pay | Source: Home / Self Care | Attending: Orthopedic Surgery

## 2019-08-14 DIAGNOSIS — R7303 Prediabetes: Secondary | ICD-10-CM | POA: Diagnosis not present

## 2019-08-14 DIAGNOSIS — E559 Vitamin D deficiency, unspecified: Secondary | ICD-10-CM | POA: Diagnosis not present

## 2019-08-14 DIAGNOSIS — M1711 Unilateral primary osteoarthritis, right knee: Secondary | ICD-10-CM | POA: Diagnosis not present

## 2019-08-14 DIAGNOSIS — G8918 Other acute postprocedural pain: Secondary | ICD-10-CM | POA: Diagnosis not present

## 2019-08-14 DIAGNOSIS — G5793 Unspecified mononeuropathy of bilateral lower limbs: Secondary | ICD-10-CM | POA: Diagnosis present

## 2019-08-14 DIAGNOSIS — Z7982 Long term (current) use of aspirin: Secondary | ICD-10-CM | POA: Diagnosis not present

## 2019-08-14 DIAGNOSIS — I10 Essential (primary) hypertension: Secondary | ICD-10-CM | POA: Diagnosis present

## 2019-08-14 DIAGNOSIS — E785 Hyperlipidemia, unspecified: Secondary | ICD-10-CM | POA: Diagnosis not present

## 2019-08-14 DIAGNOSIS — F411 Generalized anxiety disorder: Secondary | ICD-10-CM | POA: Diagnosis present

## 2019-08-14 DIAGNOSIS — Z6833 Body mass index (BMI) 33.0-33.9, adult: Secondary | ICD-10-CM | POA: Insufficient documentation

## 2019-08-14 DIAGNOSIS — E669 Obesity, unspecified: Secondary | ICD-10-CM | POA: Diagnosis not present

## 2019-08-14 DIAGNOSIS — M21161 Varus deformity, not elsewhere classified, right knee: Secondary | ICD-10-CM | POA: Diagnosis not present

## 2019-08-14 DIAGNOSIS — K5792 Diverticulitis of intestine, part unspecified, without perforation or abscess without bleeding: Secondary | ICD-10-CM | POA: Diagnosis present

## 2019-08-14 DIAGNOSIS — F430 Acute stress reaction: Secondary | ICD-10-CM | POA: Diagnosis present

## 2019-08-14 DIAGNOSIS — Z780 Asymptomatic menopausal state: Secondary | ICD-10-CM

## 2019-08-14 DIAGNOSIS — Z79899 Other long term (current) drug therapy: Secondary | ICD-10-CM | POA: Insufficient documentation

## 2019-08-14 DIAGNOSIS — G629 Polyneuropathy, unspecified: Secondary | ICD-10-CM | POA: Diagnosis not present

## 2019-08-14 DIAGNOSIS — R32 Unspecified urinary incontinence: Secondary | ICD-10-CM | POA: Diagnosis present

## 2019-08-14 HISTORY — PX: TOTAL KNEE ARTHROPLASTY: SHX125

## 2019-08-14 SURGERY — ARTHROPLASTY, KNEE, TOTAL
Anesthesia: Spinal | Site: Knee | Laterality: Right

## 2019-08-14 MED ORDER — CEFAZOLIN SODIUM-DEXTROSE 2-4 GM/100ML-% IV SOLN
2.0000 g | INTRAVENOUS | Status: AC
  Administered 2019-08-14: 2 g via INTRAVENOUS
  Filled 2019-08-14: qty 100

## 2019-08-14 MED ORDER — 0.9 % SODIUM CHLORIDE (POUR BTL) OPTIME
TOPICAL | Status: DC | PRN
  Administered 2019-08-14: 1000 mL

## 2019-08-14 MED ORDER — FENTANYL CITRATE (PF) 100 MCG/2ML IJ SOLN
INTRAMUSCULAR | Status: AC
  Filled 2019-08-14: qty 2

## 2019-08-14 MED ORDER — HYDROMORPHONE HCL 1 MG/ML IJ SOLN: 0.2500 mg | INTRAMUSCULAR | Status: DC | PRN

## 2019-08-14 MED ORDER — PROPOFOL 1000 MG/100ML IV EMUL
INTRAVENOUS | Status: AC
  Filled 2019-08-14: qty 100

## 2019-08-14 MED ORDER — NORTRIPTYLINE HCL 10 MG PO CAPS
10.0000 mg | ORAL_CAPSULE | Freq: Every day | ORAL | Status: DC
  Filled 2019-08-14: qty 1

## 2019-08-14 MED ORDER — SODIUM CHLORIDE 0.9 % IJ SOLN
INTRAMUSCULAR | Status: DC | PRN
  Administered 2019-08-14: 50 mL

## 2019-08-14 MED ORDER — GABAPENTIN 300 MG PO CAPS
300.0000 mg | ORAL_CAPSULE | Freq: Every day | ORAL | Status: DC
  Administered 2019-08-14: 300 mg via ORAL
  Filled 2019-08-14: qty 1

## 2019-08-14 MED ORDER — POVIDONE-IODINE 10 % EX SWAB
2.0000 "application " | Freq: Once | CUTANEOUS | Status: AC
  Administered 2019-08-14: 2 via TOPICAL

## 2019-08-14 MED ORDER — FENTANYL CITRATE (PF) 100 MCG/2ML IJ SOLN
INTRAMUSCULAR | Status: DC | PRN
  Administered 2019-08-14 (×2): 50 ug via INTRAVENOUS

## 2019-08-14 MED ORDER — BUPIVACAINE IN DEXTROSE 0.75-8.25 % IT SOLN
INTRATHECAL | Status: DC | PRN
Start: 2019-08-14 — End: 2019-08-14
  Administered 2019-08-14: 2 mL via INTRATHECAL

## 2019-08-14 MED ORDER — ONDANSETRON HCL 4 MG/2ML IJ SOLN: 4.0000 mg | Freq: Once | INTRAMUSCULAR | Status: DC | PRN

## 2019-08-14 MED ORDER — PHENYLEPHRINE HCL-NACL 10-0.9 MG/250ML-% IV SOLN
INTRAVENOUS | Status: DC | PRN
  Administered 2019-08-14: 40 ug/min via INTRAVENOUS

## 2019-08-14 MED ORDER — METOCLOPRAMIDE HCL 5 MG PO TABS: 5.0000 mg | ORAL_TABLET | Freq: Three times a day (TID) | ORAL | Status: DC | PRN

## 2019-08-14 MED ORDER — ONDANSETRON HCL 4 MG/2ML IJ SOLN
INTRAMUSCULAR | Status: AC
  Filled 2019-08-14: qty 2

## 2019-08-14 MED ORDER — POVIDONE-IODINE 7.5 % EX SOLN: Freq: Once | CUTANEOUS | Status: DC

## 2019-08-14 MED ORDER — DEXAMETHASONE SODIUM PHOSPHATE 10 MG/ML IJ SOLN
INTRAMUSCULAR | Status: AC
  Filled 2019-08-14: qty 1

## 2019-08-14 MED ORDER — PHENOL 1.4 % MT LIQD: 1.0000 | OROMUCOSAL | Status: DC | PRN

## 2019-08-14 MED ORDER — HYDROMORPHONE HCL 1 MG/ML IJ SOLN
0.5000 mg | INTRAMUSCULAR | Status: DC | PRN
  Administered 2019-08-14: 1 mg via INTRAVENOUS
  Filled 2019-08-14: qty 1

## 2019-08-14 MED ORDER — DIPHENHYDRAMINE HCL 12.5 MG/5ML PO ELIX: 12.5000 mg | ORAL_SOLUTION | ORAL | Status: DC | PRN

## 2019-08-14 MED ORDER — MIDAZOLAM HCL 2 MG/2ML IJ SOLN
INTRAMUSCULAR | Status: AC
  Filled 2019-08-14: qty 2

## 2019-08-14 MED ORDER — DEXAMETHASONE SODIUM PHOSPHATE 10 MG/ML IJ SOLN
10.0000 mg | Freq: Three times a day (TID) | INTRAMUSCULAR | Status: DC
  Administered 2019-08-14 – 2019-08-15 (×3): 10 mg via INTRAVENOUS
  Filled 2019-08-14 (×3): qty 1

## 2019-08-14 MED ORDER — MIDAZOLAM HCL 5 MG/5ML IJ SOLN
INTRAMUSCULAR | Status: DC | PRN
  Administered 2019-08-14 (×2): 1 mg via INTRAVENOUS

## 2019-08-14 MED ORDER — LACTATED RINGERS IV SOLN: INTRAVENOUS | Status: DC

## 2019-08-14 MED ORDER — DEXAMETHASONE SODIUM PHOSPHATE 10 MG/ML IJ SOLN: 8.0000 mg | Freq: Once | INTRAMUSCULAR | Status: DC

## 2019-08-14 MED ORDER — DEXAMETHASONE SODIUM PHOSPHATE 10 MG/ML IJ SOLN
INTRAMUSCULAR | Status: DC | PRN
  Administered 2019-08-14: 10 mg via INTRAVENOUS

## 2019-08-14 MED ORDER — BUPIVACAINE HCL (PF) 0.25 % IJ SOLN
INTRAMUSCULAR | Status: AC
  Filled 2019-08-14: qty 30

## 2019-08-14 MED ORDER — SODIUM CHLORIDE 0.9 % IR SOLN
Status: DC | PRN
  Administered 2019-08-14: 2000 mL

## 2019-08-14 MED ORDER — POLYETHYLENE GLYCOL 3350 17 G PO PACK
17.0000 g | PACK | Freq: Two times a day (BID) | ORAL | Status: DC
  Administered 2019-08-14 – 2019-08-15 (×2): 17 g via ORAL
  Filled 2019-08-14 (×2): qty 1

## 2019-08-14 MED ORDER — SODIUM CHLORIDE (PF) 0.9 % IJ SOLN
INTRAMUSCULAR | Status: AC
  Filled 2019-08-14: qty 50

## 2019-08-14 MED ORDER — PROPOFOL 10 MG/ML IV BOLUS
INTRAVENOUS | Status: DC | PRN
  Administered 2019-08-14: 30 mg via INTRAVENOUS

## 2019-08-14 MED ORDER — ASPIRIN EC 325 MG PO TBEC
325.0000 mg | DELAYED_RELEASE_TABLET | Freq: Every day | ORAL | Status: DC
  Administered 2019-08-15: 325 mg via ORAL
  Filled 2019-08-14: qty 1

## 2019-08-14 MED ORDER — POTASSIUM CHLORIDE IN NACL 20-0.9 MEQ/L-% IV SOLN
INTRAVENOUS | Status: DC
  Filled 2019-08-14 (×3): qty 1000

## 2019-08-14 MED ORDER — MEPERIDINE HCL 50 MG/ML IJ SOLN: 6.2500 mg | INTRAMUSCULAR | Status: DC | PRN

## 2019-08-14 MED ORDER — ORAL CARE MOUTH RINSE: 15.0000 mL | Freq: Once | OROMUCOSAL | Status: AC

## 2019-08-14 MED ORDER — ALUM & MAG HYDROXIDE-SIMETH 200-200-20 MG/5ML PO SUSP: 30.0000 mL | ORAL | Status: DC | PRN

## 2019-08-14 MED ORDER — ONDANSETRON HCL 4 MG/2ML IJ SOLN
4.0000 mg | Freq: Four times a day (QID) | INTRAMUSCULAR | Status: DC | PRN
  Administered 2019-08-14 (×2): 4 mg via INTRAVENOUS
  Filled 2019-08-14 (×2): qty 2

## 2019-08-14 MED ORDER — METOCLOPRAMIDE HCL 5 MG/ML IJ SOLN: 5.0000 mg | Freq: Three times a day (TID) | INTRAMUSCULAR | Status: DC | PRN

## 2019-08-14 MED ORDER — TRANEXAMIC ACID-NACL 1000-0.7 MG/100ML-% IV SOLN
1000.0000 mg | INTRAVENOUS | Status: AC
  Administered 2019-08-14: 1000 mg via INTRAVENOUS
  Filled 2019-08-14: qty 100

## 2019-08-14 MED ORDER — ROPIVACAINE HCL 7.5 MG/ML IJ SOLN
INTRAMUSCULAR | Status: DC | PRN
  Administered 2019-08-14: 20 mL via PERINEURAL

## 2019-08-14 MED ORDER — ACETAMINOPHEN 500 MG PO TABS
1000.0000 mg | ORAL_TABLET | Freq: Four times a day (QID) | ORAL | Status: AC
  Administered 2019-08-14 – 2019-08-15 (×4): 1000 mg via ORAL
  Filled 2019-08-14 (×4): qty 2

## 2019-08-14 MED ORDER — ONDANSETRON HCL 4 MG/2ML IJ SOLN
INTRAMUSCULAR | Status: DC | PRN
  Administered 2019-08-14: 4 mg via INTRAVENOUS

## 2019-08-14 MED ORDER — CHLORHEXIDINE GLUCONATE 0.12 % MT SOLN
15.0000 mL | Freq: Once | OROMUCOSAL | Status: AC
  Administered 2019-08-14: 15 mL via OROMUCOSAL

## 2019-08-14 MED ORDER — BUPIVACAINE LIPOSOME 1.3 % IJ SUSP
INTRAMUSCULAR | Status: DC | PRN
  Administered 2019-08-14: 20 mL

## 2019-08-14 MED ORDER — BUPIVACAINE HCL (PF) 0.25 % IJ SOLN
INTRAMUSCULAR | Status: DC | PRN
  Administered 2019-08-14: 30 mL

## 2019-08-14 MED ORDER — DOCUSATE SODIUM 100 MG PO CAPS
100.0000 mg | ORAL_CAPSULE | Freq: Two times a day (BID) | ORAL | Status: DC
  Administered 2019-08-14 – 2019-08-15 (×2): 100 mg via ORAL
  Filled 2019-08-14 (×2): qty 1

## 2019-08-14 MED ORDER — CEFAZOLIN SODIUM-DEXTROSE 2-4 GM/100ML-% IV SOLN
2.0000 g | Freq: Four times a day (QID) | INTRAVENOUS | Status: AC
  Administered 2019-08-14 (×2): 2 g via INTRAVENOUS
  Filled 2019-08-14 (×2): qty 100

## 2019-08-14 MED ORDER — PROPOFOL 500 MG/50ML IV EMUL
INTRAVENOUS | Status: AC
  Filled 2019-08-14: qty 50

## 2019-08-14 MED ORDER — MENTHOL 3 MG MT LOZG: 1.0000 | LOZENGE | OROMUCOSAL | Status: DC | PRN

## 2019-08-14 MED ORDER — WATER FOR IRRIGATION, STERILE IR SOLN
Status: DC | PRN
  Administered 2019-08-14: 1000 mL

## 2019-08-14 MED ORDER — ONDANSETRON HCL 4 MG PO TABS: 4.0000 mg | ORAL_TABLET | Freq: Four times a day (QID) | ORAL | Status: DC | PRN

## 2019-08-14 MED ORDER — POVIDONE-IODINE 10 % EX SWAB: 2.0000 "application " | Freq: Once | CUTANEOUS | Status: DC

## 2019-08-14 MED ORDER — OXYCODONE HCL 5 MG PO TABS
5.0000 mg | ORAL_TABLET | ORAL | Status: DC | PRN
  Administered 2019-08-14 – 2019-08-15 (×5): 10 mg via ORAL
  Filled 2019-08-14 (×5): qty 2

## 2019-08-14 MED ORDER — PROPOFOL 500 MG/50ML IV EMUL
INTRAVENOUS | Status: DC | PRN
  Administered 2019-08-14: 70 ug/kg/min via INTRAVENOUS

## 2019-08-14 MED FILL — Sodium Chloride Inj 0.9%: INTRAMUSCULAR | Qty: 50 | Status: AC

## 2019-08-14 SURGICAL SUPPLY — 68 items
APL PRP STRL LF DISP 70% ISPRP (MISCELLANEOUS) ×2
ATTUNE PSFEM RTSZ6 NARCEM KNEE (Femur) ×2 IMPLANT
ATTUNE PSRP INSR SZ6 7 KNEE (Insert) ×1 IMPLANT
ATTUNE PSRP INSR SZ6 7MM KNEE (Insert) ×1 IMPLANT
BAG SPEC THK2 15X12 ZIP CLS (MISCELLANEOUS) ×1
BAG ZIPLOCK 12X15 (MISCELLANEOUS) ×3 IMPLANT
BASE TIBIA ATTUNE KNEE SYS SZ6 (Knees) IMPLANT
BLADE HEX COATED 2.75 (ELECTRODE) ×3 IMPLANT
BLADE SAGITTAL 25.0X1.19X90 (BLADE) ×2 IMPLANT
BLADE SAGITTAL 25.0X1.19X90MM (BLADE) ×1
BLADE SAW SGTL 13X75X1.27 (BLADE) ×3 IMPLANT
BLADE SURG SZ10 CARB STEEL (BLADE) ×6 IMPLANT
BOWL SMART MIX CTS (DISPOSABLE) ×3 IMPLANT
BSPLAT TIB 6 CMNT ROT PLAT STR (Knees) ×1 IMPLANT
CEMENT HV SMART SET (Cement) ×6 IMPLANT
CHLORAPREP W/TINT 26 (MISCELLANEOUS) ×6 IMPLANT
CLOSURE STERI-STRIP 1/2X4 (GAUZE/BANDAGES/DRESSINGS) ×1
CLOSURE WOUND 1/2 X4 (GAUZE/BANDAGES/DRESSINGS) ×1
CLSR STERI-STRIP ANTIMIC 1/2X4 (GAUZE/BANDAGES/DRESSINGS) ×1 IMPLANT
COVER SURGICAL LIGHT HANDLE (MISCELLANEOUS) ×3 IMPLANT
COVER WAND RF STERILE (DRAPES) IMPLANT
CUFF TOURN SGL QUICK 34 (TOURNIQUET CUFF) ×3
CUFF TRNQT CYL 34X4.125X (TOURNIQUET CUFF) ×1 IMPLANT
DECANTER SPIKE VIAL GLASS SM (MISCELLANEOUS) ×6 IMPLANT
DRAPE ORTHO SPLIT 77X108 STRL (DRAPES) ×3
DRAPE SHEET LG 3/4 BI-LAMINATE (DRAPES) ×3 IMPLANT
DRAPE SURG ORHT 6 SPLT 77X108 (DRAPES) ×1 IMPLANT
DRAPE U-SHAPE 47X51 STRL (DRAPES) ×3 IMPLANT
DRSG AQUACEL AG ADV 3.5X10 (GAUZE/BANDAGES/DRESSINGS) ×3 IMPLANT
DRSG PAD ABDOMINAL 8X10 ST (GAUZE/BANDAGES/DRESSINGS) ×4 IMPLANT
ELECT REM PT RETURN 15FT ADLT (MISCELLANEOUS) ×3 IMPLANT
GLOVE BIO SURGEON STRL SZ7 (GLOVE) ×3 IMPLANT
GLOVE BIOGEL PI IND STRL 7.0 (GLOVE) ×1 IMPLANT
GLOVE BIOGEL PI IND STRL 7.5 (GLOVE) ×1 IMPLANT
GLOVE BIOGEL PI INDICATOR 7.0 (GLOVE) ×2
GLOVE BIOGEL PI INDICATOR 7.5 (GLOVE) ×2
GLOVE SS BIOGEL STRL SZ 7.5 (GLOVE) ×1 IMPLANT
GLOVE SUPERSENSE BIOGEL SZ 7.5 (GLOVE) ×2
GOWN STRL REUS W/ TWL LRG LVL3 (GOWN DISPOSABLE) ×2 IMPLANT
GOWN STRL REUS W/TWL LRG LVL3 (GOWN DISPOSABLE) ×6
HANDPIECE INTERPULSE COAX TIP (DISPOSABLE) ×3
HOLDER FOLEY CATH W/STRAP (MISCELLANEOUS) IMPLANT
HOOD PEEL AWAY FLYTE STAYCOOL (MISCELLANEOUS) ×9 IMPLANT
KIT TURNOVER KIT A (KITS) ×3 IMPLANT
MANIFOLD NEPTUNE II (INSTRUMENTS) ×3 IMPLANT
MARKER SKIN DUAL TIP RULER LAB (MISCELLANEOUS) ×3 IMPLANT
NDL SAFETY ECLIPSE 18X1.5 (NEEDLE) ×1 IMPLANT
NEEDLE HYPO 18GX1.5 SHARP (NEEDLE) ×3
NS IRRIG 1000ML POUR BTL (IV SOLUTION) ×3 IMPLANT
PACK TOTAL KNEE CUSTOM (KITS) ×3 IMPLANT
PATELLA MEDIAL ATTUN 35MM KNEE (Knees) ×2 IMPLANT
PENCIL SMOKE EVACUATOR (MISCELLANEOUS) ×3 IMPLANT
PIN DRILL FIX HALF THREAD (BIT) ×2 IMPLANT
PIN STEINMAN FIXATION KNEE (PIN) ×2 IMPLANT
PROTECTOR NERVE ULNAR (MISCELLANEOUS) ×3 IMPLANT
SET HNDPC FAN SPRY TIP SCT (DISPOSABLE) ×1 IMPLANT
STAPLER VISISTAT 35W (STAPLE) IMPLANT
STRIP CLOSURE SKIN 1/2X4 (GAUZE/BANDAGES/DRESSINGS) ×2 IMPLANT
SUT MNCRL AB 3-0 PS2 18 (SUTURE) ×3 IMPLANT
SUT VIC AB 0 CT1 36 (SUTURE) ×3 IMPLANT
SUT VIC AB 1 CT1 36 (SUTURE) ×3 IMPLANT
SUT VIC AB 2-0 CT1 27 (SUTURE) ×6
SUT VIC AB 2-0 CT1 TAPERPNT 27 (SUTURE) ×2 IMPLANT
SYR CONTROL 10ML LL (SYRINGE) ×9 IMPLANT
TIBIA ATTUNE KNEE SYS BASE SZ6 (Knees) ×3 IMPLANT
TRAY FOLEY MTR SLVR 14FR STAT (SET/KITS/TRAYS/PACK) ×3 IMPLANT
WATER STERILE IRR 1000ML POUR (IV SOLUTION) ×6 IMPLANT
YANKAUER SUCT BULB TIP 10FT TU (MISCELLANEOUS) ×3 IMPLANT

## 2019-08-14 NOTE — Anesthesia Procedure Notes (Signed)
Spinal  Patient location during procedure: OR Start time: 08/14/2019 7:14 AM End time: 08/14/2019 7:16 AM Staffing Performed: anesthesiologist  Anesthesiologist: Lillia Abed, MD Preanesthetic Checklist Completed: patient identified, IV checked, risks and benefits discussed, surgical consent, monitors and equipment checked, pre-op evaluation and timeout performed Spinal Block Patient position: sitting Prep: DuraPrep Patient monitoring: blood pressure, continuous pulse ox, cardiac monitor and heart rate Approach: right paramedian Location: L3-4 Injection technique: single-shot Needle Needle type: Pencan  Needle length: 9 cm Needle insertion depth: 6 cm

## 2019-08-14 NOTE — Interval H&P Note (Signed)
History and Physical Interval Note:  08/14/2019 6:20 AM  Bailey [name]  has presented today for surgery, with the diagnosis of djd right knee.  The various methods of treatment have been discussed with the patient and family. After consideration of risks, benefits and other options for treatment, the patient has consented to  Procedure(s): TOTAL KNEE ARTHROPLASTY (Right) as a surgical intervention.  The patient's history has been reviewed, patient examined, no change in status, stable for surgery.  I have reviewed the patient's chart and labs.  Questions were answered to the patient's satisfaction.     Lorn Junes

## 2019-08-14 NOTE — Evaluation (Signed)
Physical Therapy Evaluation Patient Details Name: Bailey [name] MRN: [id number] DOB: October 13, 1950 Today's Date: 08/14/2019   History of Present Illness  s/p R TKA  Clinical Impression  Pt is s/p TKA resulting in the deficits listed below (see PT Problem List). Pt  amb 30' with RW, min assist. Distance limited by N/V this pm. RN aware and gave meds during session.   Pt will benefit from skilled PT to increase their independence and safety with mobility to allow discharge to the venue listed below.      Follow Up Recommendations Follow surgeon's recommendation for DC plan and follow-up therapies    Equipment Recommendations  None recommended by PT    Recommendations for Other Services       Precautions / Restrictions Precautions Precautions: Knee;Fall Restrictions Weight Bearing Restrictions: No Other Position/Activity Restrictions: WBAT      Mobility  Bed Mobility Overal bed mobility: Needs Assistance Bed Mobility: Supine to Sit     Supine to sit: Min assist     General bed mobility comments: assist with RLE, incr time  Transfers Overall transfer level: Needs assistance Equipment used: Rolling walker (2 wheeled) Transfers: Sit to/from Stand Sit to Stand: Min assist;+2 safety/equipment         General transfer comment: cues for hand placement and RLE position, assist to power up to stand  Ambulation/Gait Ambulation/Gait assistance: Min assist Gait Distance (Feet): 30 Feet Assistive device: Rolling walker (2 wheeled) Gait Pattern/deviations: Step-to pattern;Decreased stance time - right     General Gait Details: cues for sequence and RW position.  Stairs            Wheelchair Mobility    Modified Rankin (Stroke Patients Only)       Balance                                             Pertinent Vitals/Pain Pain Assessment: 0-10 Pain Score: 5  Pain Location: right knee Pain Descriptors / Indicators: Sore Pain  Intervention(s): Limited activity within patient's tolerance;Monitored during session;Repositioned    Home Living Family/patient expects to be discharged to:: Private residence Living Arrangements: Spouse/significant other Available Help at Discharge: Family Type of Home: House Home Access: Stairs to enter Entrance Stairs-Rails: None Entrance Stairs-Number of Steps: 3 Home Layout: One level Home Equipment: Environmental consultant - 2 wheels;Cane - single point;Bedside commode;Shower seat      Prior Function Level of Independence: Independent               Hand Dominance        Extremity/Trunk Assessment   Upper Extremity Assessment Upper Extremity Assessment: Overall WFL for tasks assessed    Lower Extremity Assessment Lower Extremity Assessment: RLE deficits/detail RLE Deficits / Details: ankle WFL, knee extension and hip flexion 2+/5. knee AROM ~ 15 to 70 degrees       Communication   Communication: No difficulties  Cognition Arousal/Alertness: Awake/alert Behavior During Therapy: WFL for tasks assessed/performed Overall Cognitive Status: Within Functional Limits for tasks assessed                                        General Comments      Exercises Total Joint Exercises Ankle Circles/Pumps: AROM;Both;5 reps   Assessment/Plan    PT Assessment Patient  needs continued PT services  PT Problem List Decreased strength;Decreased mobility;Decreased activity tolerance;Decreased knowledge of use of DME;Pain;Decreased range of motion       PT Treatment Interventions DME instruction;Therapeutic exercise;Gait training;Functional mobility training;Therapeutic activities;Patient/family education;Stair training    PT Goals (Current goals can be found in the Care Plan section)  Acute Rehab PT Goals Patient Stated Goal: home PT Goal Formulation: With patient Time For Goal Achievement: 08/21/19 Potential to Achieve Goals: Good    Frequency 7X/week   Barriers  to discharge        Co-evaluation               AM-PAC PT "6 Clicks" Mobility  Outcome Measure Help needed turning from your back to your side while in a flat bed without using bedrails?: A Little Help needed moving from lying on your back to sitting on the side of a flat bed without using bedrails?: A Little Help needed moving to and from a bed to a chair (including a wheelchair)?: A Little Help needed standing up from a chair using your arms (e.g., wheelchair or bedside chair)?: A Little Help needed to walk in hospital room?: A Little Help needed climbing 3-5 steps with a railing? : A Lot 6 Click Score: 17    End of Session Equipment Utilized During Treatment: Gait belt Activity Tolerance: Patient tolerated treatment well Patient left: in chair;with call bell/phone within reach;with chair alarm set;with family/visitor present   PT Visit Diagnosis: Difficulty in walking, not elsewhere classified (R26.2)    Time: 1547-1610 PT Time Calculation (min) (ACUTE ONLY): 23 min   Charges:   PT Evaluation $PT Eval Low Complexity: 1 Low PT Treatments $Gait Training: 8-22 mins        Baxter Flattery, PT  Acute Rehab Dept ([place]) [phone] Pager [phone]  08/14/2019   [facility] 08/14/2019, 4:21 PM

## 2019-08-14 NOTE — Anesthesia Postprocedure Evaluation (Signed)
Anesthesia Post Note  Patient: Bailey [name]  Procedure(s) Performed: TOTAL KNEE ARTHROPLASTY (Right Knee)     Patient location during evaluation: PACU Anesthesia Type: Spinal Level of consciousness: oriented and awake and alert Pain management: pain level controlled Vital Signs Assessment: post-procedure vital signs reviewed and stable Respiratory status: spontaneous breathing, respiratory function stable and patient connected to nasal cannula oxygen Cardiovascular status: blood pressure returned to baseline and stable Postop Assessment: no headache, no backache and no apparent nausea or vomiting Anesthetic complications: no   No complications documented.  Last Vitals:  Vitals:   08/14/19 1015 08/14/19 1030  BP: (!) 166/83 (!) 154/73  Pulse: 60 60  Resp: 16 13  Temp:    SpO2: 99% 100%    Last Pain:  Vitals:   08/14/19 1015  TempSrc:   PainSc: 0-No pain                 Jonna Dittrich DAVID

## 2019-08-14 NOTE — Anesthesia Procedure Notes (Signed)
Anesthesia Regional Block: Adductor canal block   Pre-Anesthetic Checklist: ,, timeout performed, Correct Patient, Correct Site, Correct Laterality, Correct Procedure, Correct Position, site marked, Risks and benefits discussed,  Surgical consent,  Pre-op evaluation,  At surgeon's request and post-op pain management  Laterality: Right  Prep: chloraprep       Needles:  Injection technique: Single-shot  Needle Type: Echogenic Stimulator Needle     Needle Length: 9cm  Needle Gauge: 21     Additional Needles:   Narrative:  Start time: 08/14/2019 6:57 AM End time: 08/14/2019 7:07 AM Injection made incrementally with aspirations every 5 mL.  Performed by: Personally  Anesthesiologist: Lillia Abed, MD  Additional Notes: Monitors applied. Patient sedated. Sterile prep and drape,hand hygiene and sterile gloves were used. Relevant anatomy identified.Needle position confirmed.Local anesthetic injected incrementally after negative aspiration. Local anesthetic spread visualized around nerve(s). Vascular puncture avoided. No complications. Image printed for medical record.The patient tolerated the procedure well.    Lillia Abed MD

## 2019-08-14 NOTE — Op Note (Signed)
MRN:     [id number] DOB/AGE:    05-21-1950 / 69 y.o.       OPERATIVE REPORT   DATE OF PROCEDURE:  08/14/2019      PREOPERATIVE DIAGNOSIS:   Primary Localized Osteoarthritis right Knee       Estimated body mass index is 33.83 kg/m as calculated from the following:   Height as of 08/07/19: 5\' 5"  (1.651 m).   Weight as of 08/07/19: 92.2 kg.                                                       POSTOPERATIVE DIAGNOSIS:   Same                                                                 PROCEDURE:  Procedure(s): TOTAL KNEE ARTHROPLASTY Using Depuy Attune RP implants #6 narrow Femur, #6Tibia, 68mm  RP bearing, 35 Patella    SURGEON: Yarelie Hams A. Noemi Chapel, MD   ASSISTANT: Matthew Saras, PA-C, present and scrubbed throughout the case, critical for retraction, instrumentation, and closure.  ANESTHESIA: Spinal with Adductor Nerve Block  TOURNIQUET TIME: 55 minutes   COMPLICATIONS:  None       SPECIMENS: None   INDICATIONS FOR PROCEDURE: The patient has djd of the knee with varus deformities, XR shows bone on bone arthritis. Patient has failed all conservative measures including anti-inflammatory medicines, narcotics, attempts at exercise and weight loss, cortisone injections and viscosupplementation.  Risks and benefits of surgery have been discussed, questions answered.    DESCRIPTION OF PROCEDURE: The patient identified by armband, received right adductor canal block and IV antibiotics, in the holding area at [facility]. Patient taken to the operating room, appropriate anesthetic monitors were attached. Spinal anesthesia induced with the patient in supine position, Foley catheter was inserted. Tourniquet applied high to the operative thigh. Lateral post and foot positioner applied to the table, the lower extremity was then prepped and draped in usual sterile fashion from the ankle to the tourniquet. Time-out procedure was performed. The limb was wrapped with an Esmarch bandage and the  tourniquet inflated to 365 mmHg.   We began the operation by making a 6cm anterior midline incision. Small bleeders in the skin and the subcutaneous tissue identified and cauterized. Transverse retinaculum was incised and reflected medially and a medial parapatellar arthrotomy was accomplished. the patella was everted and theprepatellar fat pad resected. The superficial medial collateral ligament was then elevated from anterior to posterior along the proximal flare of the tibia and anterior half of the menisci resected. The knee was hyperflexed exposing bone on bone arthritis. Peripheral and notch osteophytes as well as the cruciate ligaments were then resected. We continued to work our way around posteriorly along the proximal tibia, and externally rotated the tibia subluxing it out from underneath the femur. A McHale retractor was placed through the notch and a lateral Hohmann retractor placed, and an external tibial guide was placed.  The tibial cutting guide was pinned into place allowing resection of 4 mm of bone medially and about 6 mm of bone laterally because of her varus deformity.  Satisfied with the tibial resection, we then entered the distal femur 2 mm anterior to the PCL origin with the intramedullary guide rod and applied the distal femoral cutting guide set at 48mm, with 5 degrees of valgus. This was pinned along the epicondylar axis. At this point, the distal femoral cut was accomplished without difficulty. We then sized for a 6 narrow femoral component and pinned the guide in 3 degrees of external rotation.The chamfer cutting guide was pinned into place. The anterior, posterior, and chamfer cuts were accomplished without difficulty followed by the  RP box cutting guide and the box cut. We also removed posterior osteophytes from the posterior femoral condyles. At this time, the knee was brought into full extension. We checked our extension and flexion gaps and found them symmetric at 7.  The  patella thickness measured at 32m m. We set the cutting guide at 15 and removed the posterior patella sized for 35 button and drilled the lollipop. The knee was then once again hyperflexed exposing the proximal tibia. We sized for a # 6 tibial base plate, applied the smokestack and the conical reamer followed by the the Delta fin keel punch. We then hammered into place the  RP trial femoral component, inserted a trial bearing, trial patellar button, and took the knee through range of motion from 0-130 degrees. No thumb pressure was required for patellar tracking.   At this point, all trial components were removed, a double batch of DePuy HV cement  was mixed and applied to all bony metallic mating surfaces. In order, we hammered into place the tibial tray and removed excess cement, the femoral component and removed excess cement, a 7 mm  RP bearing was inserted, and the knee brought to full extension with compression. The patellar button was clamped into place, and excess cement removed. While the cement cured the wound was irrigated out with normal saline solution pulse lavage, and exparel was injected throughout the knee. Ligament stability and patellar tracking were checked and found to be excellent..   The parapatellar arthrotomy was closed with  #1 Vicryl suture. The subcutaneous tissue with 0 and 2-0 undyed Vicryl suture, and 4-0 Monocryl.. A dressing of Aquaseal, 4 x 4, dressing sponges, Webril, and Ace wrap applied. Needle and sponge count were correct times 2.The patient awakened, extubated, and taken to recovery room without difficulty. Vascular status was normal, pulses 2+ and symmetric.    Lorn Junes 05/24/2017, 8:56 AM

## 2019-08-14 NOTE — Anesthesia Preprocedure Evaluation (Signed)
Anesthesia Evaluation  Patient identified by MRN, date of birth, ID band Patient awake    Reviewed: Allergy & Precautions, NPO status , Patient's Chart, lab work & pertinent test results  Airway Mallampati: I  TM Distance: >3 FB Neck ROM: Full    Dental   Pulmonary    Pulmonary exam normal        Cardiovascular hypertension, Pt. on medications Normal cardiovascular exam     Neuro/Psych Anxiety    GI/Hepatic   Endo/Other    Renal/GU      Musculoskeletal   Abdominal   Peds  Hematology   Anesthesia Other Findings   Reproductive/Obstetrics                             Anesthesia Physical Anesthesia Plan  ASA: II  Anesthesia Plan: Spinal   Post-op Pain Management:  Regional for Post-op pain   Induction: Intravenous  PONV Risk Score and Plan: 2 and Ondansetron and Treatment may vary due to age or medical condition  Airway Management Planned: Nasal Cannula  Additional Equipment:   Intra-op Plan:   Post-operative Plan:   Informed Consent: I have reviewed the patients History and Physical, chart, labs and discussed the procedure including the risks, benefits and alternatives for the proposed anesthesia with the patient or authorized representative who has indicated his/her understanding and acceptance.       Plan Discussed with: CRNA and Surgeon  Anesthesia Plan Comments:         Anesthesia Quick Evaluation

## 2019-08-14 NOTE — Progress Notes (Signed)
Orthopedic Tech Progress Note Patient Details:  Bailey [name] June 30, 1950 [id number]  Patient ID: [name], female   DOB: 06-Apr-1950, 69 y.o.   MRN: [id number]   Kennis Carina 08/14/2019, 3:13 PM cpm removed @1513 

## 2019-08-14 NOTE — Progress Notes (Signed)
Met briefly with pt and daughter to confirm d/c arrangements as pt is Ortho bundle.  All DME at the home including rw, 3n1 and CPM.  [facility] to follow for HHPT.  No further TOC needs.  Turki Tapanes, LCSW

## 2019-08-14 NOTE — Transfer of Care (Signed)
Immediate Anesthesia Transfer of Care Note  Patient: Bailey [name]  Procedure(s) Performed: TOTAL KNEE ARTHROPLASTY (Right Knee)  Patient Location: PACU  Anesthesia Type:Spinal  Level of Consciousness: awake, alert  and oriented  Airway & Oxygen Therapy: Patient Spontanous Breathing and Patient connected to face mask oxygen  Post-op Assessment: Report given to RN and Post -op Vital signs reviewed and stable  Post vital signs: Reviewed and stable  Last Vitals:  Vitals Value Taken Time  BP    Temp    Pulse 57 08/14/19 0923  Resp 14 08/14/19 0923  SpO2 100 % 08/14/19 0923  Vitals shown include unvalidated device data.  Last Pain:  Vitals:   08/14/19 0920  TempSrc:   PainSc: (P) 0-No pain         Complications: No complications documented.

## 2019-08-15 ENCOUNTER — Encounter (HOSPITAL_COMMUNITY): Payer: Self-pay | Admitting: Orthopedic Surgery

## 2019-08-15 ENCOUNTER — Telehealth: Payer: Self-pay | Admitting: *Deleted

## 2019-08-15 DIAGNOSIS — E559 Vitamin D deficiency, unspecified: Secondary | ICD-10-CM | POA: Diagnosis not present

## 2019-08-15 DIAGNOSIS — E785 Hyperlipidemia, unspecified: Secondary | ICD-10-CM | POA: Diagnosis not present

## 2019-08-15 DIAGNOSIS — Z96651 Presence of right artificial knee joint: Secondary | ICD-10-CM | POA: Diagnosis not present

## 2019-08-15 DIAGNOSIS — R7303 Prediabetes: Secondary | ICD-10-CM | POA: Diagnosis not present

## 2019-08-15 DIAGNOSIS — I10 Essential (primary) hypertension: Secondary | ICD-10-CM | POA: Diagnosis not present

## 2019-08-15 DIAGNOSIS — G629 Polyneuropathy, unspecified: Secondary | ICD-10-CM | POA: Diagnosis not present

## 2019-08-15 DIAGNOSIS — Z7982 Long term (current) use of aspirin: Secondary | ICD-10-CM | POA: Diagnosis not present

## 2019-08-15 DIAGNOSIS — M1711 Unilateral primary osteoarthritis, right knee: Secondary | ICD-10-CM | POA: Diagnosis not present

## 2019-08-15 DIAGNOSIS — Z79899 Other long term (current) drug therapy: Secondary | ICD-10-CM | POA: Diagnosis not present

## 2019-08-15 LAB — CBC
HCT: 35.9 % — ABNORMAL LOW (ref 36.0–46.0)
Hemoglobin: 12.3 g/dL (ref 12.0–15.0)
MCH: 32.7 pg (ref 26.0–34.0)
MCHC: 34.3 g/dL (ref 30.0–36.0)
MCV: 95.5 fL (ref 80.0–100.0)
Platelets: 199 10*3/uL (ref 150–400)
RBC: 3.76 MIL/uL — ABNORMAL LOW (ref 3.87–5.11)
RDW: 12.6 % (ref 11.5–15.5)
WBC: 12.2 10*3/uL — ABNORMAL HIGH (ref 4.0–10.5)
nRBC: 0 % (ref 0.0–0.2)

## 2019-08-15 LAB — BASIC METABOLIC PANEL
Anion gap: 6 (ref 5–15)
BUN: 12 mg/dL (ref 8–23)
CO2: 25 mmol/L (ref 22–32)
Calcium: 9.4 mg/dL (ref 8.9–10.3)
Chloride: 107 mmol/L (ref 98–111)
Creatinine, Ser: 0.66 mg/dL (ref 0.44–1.00)
GFR calc Af Amer: >60 mL/min (ref >60)
GFR calc non Af Amer: >60 mL/min (ref >60)
Glucose, Bld: 164 mg/dL — ABNORMAL HIGH (ref 70–99)
Potassium: 4.7 mmol/L (ref 3.5–5.1)
Sodium: 138 mmol/L (ref 135–145)

## 2019-08-15 MED ORDER — SODIUM CHLORIDE 0.9 % IV BOLUS: 500.0000 mL | Freq: Once | INTRAVENOUS | Status: DC

## 2019-08-15 MED ORDER — GABAPENTIN 300 MG PO CAPS: ORAL_CAPSULE | ORAL | 0 refills | Status: DC

## 2019-08-15 MED ORDER — POLYETHYLENE GLYCOL 3350 17 G PO PACK: PACK | ORAL | 0 refills | Status: DC

## 2019-08-15 MED ORDER — DOCUSATE SODIUM 100 MG PO CAPS: ORAL_CAPSULE | ORAL | 0 refills | Status: DC

## 2019-08-15 MED ORDER — ONDANSETRON HCL 4 MG PO TABS: 4.0000 mg | ORAL_TABLET | Freq: Four times a day (QID) | ORAL | 0 refills | Status: DC | PRN

## 2019-08-15 MED ORDER — OXYCODONE HCL 5 MG PO TABS: ORAL_TABLET | ORAL | 0 refills | Status: DC

## 2019-08-15 MED ORDER — ASPIRIN 325 MG PO TBEC: DELAYED_RELEASE_TABLET | ORAL | 0 refills | Status: DC

## 2019-08-15 NOTE — Discharge Summary (Signed)
Patient ID: Bailey [name] MRN: [id number] DOB/AGE: 05/18/1950 69 y.o.  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 Arthritis   . Arthritis of right knee   . Borderline diabetes   . Burning sensation of feet   . Colitis    hx colitis-gi bleed-2006  . Constipation   . DYSLIPIDEMIA   . Gallbladder problem   . Headache    hx of 10 years ago  . Hx of cardiovascular stress test    Lexiscan Myoview 9/14:  Small, fixed apical anterior perfusion defect (worse at rest) - probable soft tissue attenuation, EF 61%, low risk study  . HYPERTENSION   . Lower extremity edema   . Partial tear of subscapularis tendon 04/22/2012  . POSTMENOPAUSAL STATUS   . Pre-diabetes   . Supraspinatus tendon tear 04/22/2012  . Tubular adenoma of colon 08/2013  . Vitamin D deficiency     Surgeries: Procedure(s): TOTAL KNEE ARTHROPLASTY on 08/14/2019   Consultants:   Discharged Condition: Improved  Hospital Course: Bailey [name] is an 69 y.o. female who was admitted 08/14/2019 for operative treatment ofPrimary localized osteoarthritis of right knee. Patient has severe unremitting pain that affects sleep, daily activities, and work/hobbies. After pre-op clearance the patient was taken to the operating room on 08/14/2019 and underwent  Procedure(s): TOTAL KNEE ARTHROPLASTY.    Patient was given perioperative antibiotics:  Anti-infectives (From admission, onward)   Start     Dose/Rate Route Frequency Ordered Stop   08/14/19 1330  ceFAZolin (ANCEF) IVPB  2g/100 mL premix        2 g 200 mL/hr over 30 Minutes Intravenous Every 6 hours 08/14/19 1202 08/14/19 1955   08/14/19 0600  ceFAZolin (ANCEF) IVPB 2g/100 mL premix        2 g 200 mL/hr over 30 Minutes Intravenous On call to O.R. 08/14/19 3646 08/14/19 0719       Patient was given sequential compression devices, early ambulation, and chemoprophylaxis to prevent DVT.  Patient benefited maximally from hospital stay and there were no complications.    Recent vital signs:  Patient Vitals for the past 24 hrs:  BP Temp Temp src Pulse Resp SpO2 Height Weight  08/15/19 0934 (!) 156/76 (!) 97 F (36.1 C) Axillary (!) 56 17 -- -- --  08/15/19 0535 (!) 146/80 (!) 97.5 F (36.4 C) Oral (!) 54 18 99 % -- --  08/15/19 0130 (!) 157/81 (!) 97.5 F (36.4 C) Oral (!) 56 16 99 % -- --  08/14/19 2142 (!) 158/79 97.6 F (36.4 C) Oral (!) 52 16 100 % -- --  08/14/19 2038 -- -- -- -- -- -- 5\' 5"  (1.651 m) 92.2 kg  08/14/19 1730 (!) 154/77 -- -- (!) 55 16 100 % -- --  08/14/19 1451 (!) 164/89 97.7 F (36.5 C) -- (!) 59 16 100 % -- --  08/14/19 1346 (!) 152/80 -- -- 60 16 99 % -- --  08/14/19 1246 (!) 158/88 98.1 F (36.7 C) -- (!) 56 16 99 % -- --  08/14/19  1143 (!) 176/83 -- -- (!) 57 14 100 % -- --  08/14/19 1100 (!) 168/80 98 F (36.7 C) -- 66 13 100 % -- --  08/14/19 1045 (!) 157/82 -- -- (!) 52 12 100 % -- --  08/14/19 1030 (!) 154/73 -- -- 60 13 100 % -- --  08/14/19 1015 (!) 166/83 -- -- 60 16 99 % -- --     Recent laboratory studies:  Recent Labs    08/15/19 0321  WBC 12.2*  HGB 12.3  HCT 35.9*  PLT 199  NA 138  K 4.7  CL 107  CO2 25  BUN 12  CREATININE 0.66  GLUCOSE 164*  CALCIUM 9.4     Discharge Medications:   Allergies as of 08/15/2019      Reactions   Codeine Nausea Only   Hctz [hydrochlorothiazide]    incontinence   Statins    Leg cramps      Medication List    STOP taking these medications   amLODipine-olmesartan 5-20 MG tablet Commonly known as: AZOR    COLLAGEN PO     TAKE these medications   aspirin 325 MG EC tablet 1 tab a day for the next 30 days to prevent blood clots What changed:   medication strength  how much to take  how to take this  when to take this  additional instructions   docusate sodium 100 MG capsule Commonly known as: COLACE 1 tab 2 times a day while on narcotics.  STOOL SOFTENER   ezetimibe 10 MG tablet Commonly known as: ZETIA Take 1 tablet (10 mg total) by mouth at bedtime.   gabapentin 300 MG capsule Commonly known as: NEURONTIN 1 po qhs for nerve pain   nortriptyline 10 MG capsule Commonly known as: Pamelor Take 1 capsule (10 mg total) by mouth at bedtime. Use for your bladder problem.   ondansetron 4 MG tablet Commonly known as: ZOFRAN Take 1 tablet (4 mg total) by mouth every 6 (six) hours as needed for nausea.   oxyCODONE 5 MG immediate release tablet Commonly known as: Oxy IR/ROXICODONE 1 po q 4 hrs prn pain.  Patient had a total knee replacement on 08/14/2019   polyethylene glycol 17 g packet Commonly known as: MIRALAX / GLYCOLAX 17grams in 6 oz of something to drink twice a day until bowel movement.  LAXITIVE.  Restart if two days since last bowel movement   Vitamin D (Ergocalciferol) 1.25 MG (50000 UNIT) Caps capsule Commonly known as: DRISDOL TAKE 1 CAPSULE BY MOUTH EVERY 7 DAYS What changed: See the new instructions.            Discharge Care Instructions  (From admission, onward)         Start     Ordered   08/15/19 0000  Change dressing       Comments: Change the gauze dressing daily with sterile 4 x 4 inch gauze and apply TED hose.  DO NOT REMOVE BANDAGE OVER SURGICAL INCISION.  [place] WHOLE LEG INCLUDING OVER THE WATERPROOF BANDAGE WITH SOAP AND WATER EVERY DAY.   08/15/19 0857          Diagnostic Studies: No results found.  Disposition: Discharge disposition: 01-Home or Self Care       Discharge Instructions    CPM   Complete by: As directed     Continuous passive motion machine (CPM):      Use the CPM from 0 to 90 for 6 hours per day.  You may break it up into 2 or 3 sessions per day.      Use CPM for 2 weeks or until you are told to stop.   Call MD / Call 911   Complete by: As directed    If you experience chest pain or shortness of breath, CALL 911 and be transported to the hospital emergency room.  If you develope a fever above 101 F, pus (white drainage) or increased drainage or redness at the wound, or calf pain, call your surgeon's office.   Change dressing   Complete by: As directed    Change the gauze dressing daily with sterile 4 x 4 inch gauze and apply TED hose.  DO NOT REMOVE BANDAGE OVER SURGICAL INCISION.  [place] WHOLE LEG INCLUDING OVER THE WATERPROOF BANDAGE WITH SOAP AND WATER EVERY DAY.   Constipation Prevention   Complete by: As directed    Drink plenty of fluids.  Prune juice may be helpful.  You may use a stool softener, such as Colace (over the counter) 100 mg twice a day.  Use MiraLax (over the counter) for constipation as needed.   Diet - low sodium heart healthy   Complete by: As directed    Discharge instructions   Complete by: As directed    INSTRUCTIONS AFTER JOINT REPLACEMENT   DO NOT TAKE YOUR BLOOD PRESSURE MEDICATION UNTIL YOU BLOOD PRESSURE IS 140/90 OR HIGHER IF ZOFRAN DOES NOT WORK FOR NAUSEA BUY SOME DRAMAMINE.  THIS WILL HELP GET UP AND WALK EVERY HOUR.   DO NOT ICE THE WOUND UNTIL YOU ARE A WEEK TO 10 DAYS OUT FROM SURGERY.  IT WILL INTERFERE WITH THE WOUND HEALING  Remove items at home which could result in a fall. This includes throw rugs or furniture in walking pathways  You may notice swelling that will progress down to the foot and ankle.  This is normal after surgery.  Elevate your leg when you are not up walking on it.    Continue to use the breathing machine you got in the hospital (incentive spirometer) which will help keep your temperature down.  It is common for your  temperature to cycle up and down following surgery, especially at night when you are not up moving around and exerting yourself.  The breathing machine keeps your lungs expanded and your temperature down.   DIET:  As you were doing prior to hospitalization, we recommend a well-balanced diet WITH LOTS OF EXTRA FLUIDS  DRESSING / WOUND CARE / SHOWERING  Keep the surgical dressing until follow up.  The dressing is water proof, so you can shower without any extra covering.  IF THE DRESSING FALLS OFF or the wound gets wet inside, change the dressing with sterile gauze.  Please use good hand washing techniques before changing the dressing.  Do not use any lotions or creams on the incision until instructed by your surgeon.    ACTIVITY  Increase activity slowly as tolerated, but follow the weight bearing instructions below.   No driving for 6 weeks or until further direction given by your physician.  You cannot drive while taking narcotics.  No lifting or carrying greater than 10 lbs. until further directed by your surgeon. Avoid periods of inactivity such as sitting longer than an hour when not asleep. This helps prevent blood clots.  You may return to work once you are authorized by your doctor.     WEIGHT BEARING   Weight bearing as tolerated with assist device (  walker, cane, etc) as directed, use it as long as suggested by your surgeon or therapist, typically at least 2-3 weeks.   EXERCISES  Results after joint replacement surgery are often greatly improved when you follow the exercise, range of motion and muscle strengthening exercises prescribed by your doctor. Safety measures are also important to protect the joint from further injury. Any time any of these exercises cause you to have increased pain or swelling, decrease what you are doing until you are comfortable again and then slowly increase them. If you have problems or questions, call your caregiver or physical therapist for advice.    Rehabilitation is important following a joint replacement. After just a few days of immobilization, the muscles of the leg can become weakened and shrink (atrophy).  These exercises are designed to build up the tone and strength of the thigh and leg muscles and to improve motion. Often times heat used for twenty to thirty minutes before working out will loosen up your tissues and help with improving the range of motion but do not use heat for the first two weeks following surgery (sometimes heat can increase post-operative swelling).   These exercises can be done on a training (exercise) mat, on the floor, on a table or on a bed. Use whatever works the best and is most comfortable for you.    Use music or television while you are exercising so that the exercises are a pleasant break in your day. This will make your life better with the exercises acting as a break in your routine that you can look forward to.   Perform all exercises about fifteen times, three times per day or as directed.  You should exercise both the operative leg and the other leg as well.   Exercises include:  Quad Sets - Tighten up the muscle on the front of the thigh (Quad) and hold for 5-10 seconds.   Straight Leg Raises - With your knee straight (if you were given a brace, keep it on), lift the leg to 60 degrees, hold for 3 seconds, and slowly lower the leg.  Perform this exercise against resistance later as your leg gets stronger.  Leg Slides: Lying on your back, slowly slide your foot toward your buttocks, bending your knee up off the floor (only go as far as is comfortable). Then slowly slide your foot back down until your leg is flat on the floor again.  Angel Wings: Lying on your back spread your legs to the side as far apart as you can without causing discomfort.  Hamstring Strength:  Lying on your back, push your heel against the floor with your leg straight by tightening up the muscles of your buttocks.  Repeat, but this  time bend your knee to a comfortable angle, and push your heel against the floor.  You may put a pillow under the heel to make it more comfortable if necessary.   A rehabilitation program following joint replacement surgery can speed recovery and prevent re-injury in the future due to weakened muscles. Contact your doctor or a physical therapist for more information on knee rehabilitation.    CONSTIPATION  Constipation is defined medically as fewer than three stools per week and severe constipation as less than one stool per week.  Even if you have a regular bowel pattern at home, your normal regimen is likely to be disrupted due to multiple reasons following surgery.  Combination of anesthesia, postoperative narcotics, change in appetite and fluid intake  all can affect your bowels.   YOU MUST use at least one of the following options; they are listed in order of increasing strength to get the job done.  They are all available over the counter, and you may need to use some, POSSIBLY even all of these options:    Drink plenty of fluids (prune juice may be helpful) and high fiber foods Colace 100 mg by mouth twice a day  Senokot for constipation as directed and as needed Dulcolax (bisacodyl), take with full glass of water  Miralax (polyethylene glycol) once or twice a day as needed.  If you have tried all these things and are unable to have a bowel movement in the first 3-4 days after surgery call either your surgeon or your primary doctor.    If you experience loose stools or diarrhea, hold the medications until you stool forms back up.  If your symptoms do not get better within 1 week or if they get worse, check with your doctor.  If you experience "the worst abdominal pain ever" or develop nausea or vomiting, please contact the office immediately for further recommendations for treatment.   ITCHING:  If you experience itching with your medications, try taking only a single pain pill, or even  half a pain pill at a time.  You can also use Benadryl over the counter for itching or also to help with sleep.   TED HOSE STOCKINGS:  Use stockings on both legs until for at least 2 weeks or as directed by physician office. They may be removed at night for sleeping.  MEDICATIONS:  See your medication summary on the "After Visit Summary" that nursing will review with you.  You may have some home medications which will be placed on hold until you complete the course of blood thinner medication.  It is important for you to complete the blood thinner medication as prescribed.  PRECAUTIONS:  If you experience chest pain or shortness of breath - call 911 immediately for transfer to the hospital emergency department.   If you develop a fever greater that 101 F, purulent drainage from wound, increased redness or drainage from wound, foul odor from the wound/dressing, or calf pain - CONTACT YOUR SURGEON.                                                   FOLLOW-UP APPOINTMENTS:  If you do not already have a post-op appointment, please call the office for an appointment to be seen by your surgeon.  Guidelines for how soon to be seen are listed in your "After Visit Summary", but are typically between 1-4 weeks after surgery.  OTHER INSTRUCTIONS:   Knee Replacement:  Do not place pillow under knee, focus on keeping the knee straight while resting. CPM instructions: 0-90 degrees, 2 hours in the morning, 2 hours in the afternoon, and 2 hours in the evening. Place foam block, curve side up under heel at all times except when in CPM or when walking.  DO NOT modify, tear, cut, or change the foam block in any way.  MAKE SURE YOU:  Understand these instructions.  Get help right away if you are not doing well or get worse.    Thank you for letting us be a part of your medical care team.  It is a privilege we respect  greatly.  We hope these instructions will help you stay on track for a fast and full recovery!   Do  not put a pillow under the knee. Place it under the heel.   Complete by: As directed    Place gray foam block, curve side up under heel at all times except when in CPM or when walking.  DO NOT modify, tear, cut, or change in any way the gray foam block.   Increase activity slowly as tolerated   Complete by: As directed    Patient may shower   Complete by: As directed    Aquacel dressing is water proof    Wash over it and the whole leg with soap and water at the end of your shower   TED hose   Complete by: As directed    Use stockings (TED hose) for 2 weeks on both leg(s).  You may remove them at night for sleeping.       Follow-up Information    Elsie Saas, MD. Go on 08/24/2019.   Specialty: Orthopedic Surgery Why: Your appointment has been scheduled for 11:30   Contact information: [address] [place] [address] [phone]        [facility] Follow up.   Specialty: [place] Why: HHPT will see you at home for 5 visits prior to starting outpatient physical therapy  Contact information: [address] [place] [address] [phone]        [facility], [place]. Go on 08/24/2019.   Why: Your outpatient physical therapy is scheduled for 1:00. Please arrive at 12:30 to complete your paperwork  Contact information: [facility] [place] [address] [phone]                Signed: Linda Hedges 08/15/2019, 10:09 AM

## 2019-08-15 NOTE — Telephone Encounter (Signed)
Pt was on TCM report admitted 08/14/19 for osteoarthritis of right knee. Pt underwent a TOTAL KNEE (R)  ARTHROPLASTY. Pt tolerated procedure well and was D/C 08/15/19. Pt will follow=up w/surgeon in 2 weeks.Marland KitchenJohny Chess

## 2019-08-15 NOTE — Progress Notes (Signed)
   08/15/19 1300  PT Visit Information  Last PT Received On 08/15/19  Pt  Doing well,  Reviewed HEP,   Pt ready to d/c with family support from PT standpoint.   Assistance Needed +1  History of Present Illness s/p R TKA  Subjective Data  Patient Stated Goal home  Precautions  Precautions Knee;Fall  Restrictions  Other Position/Activity Restrictions WBAT  Pain Assessment  Pain Assessment 0-10  Pain Score 6  Pain Location right knee  Pain Descriptors / Indicators Sore  Pain Intervention(s) Limited activity within patient's tolerance;Monitored during session;Premedicated before session;Patient requesting pain meds-RN notified;Repositioned  Cognition  Arousal/Alertness Awake/alert  Behavior During Therapy WFL for tasks assessed/performed  Overall Cognitive Status Within Functional Limits for tasks assessed  Bed Mobility  General bed mobility comments assist with RLE, incr time  Total Joint Exercises  Ankle Circles/Pumps AROM;Both;10 reps  Quad Sets AROM;Both;10 reps  Short Arc Quad AROM;10 reps;Right  Heel Slides Right;10 reps;AROM;AAROM  Hip ABduction/ADduction AROM;Right;10 reps  Goniometric ROM 12 to 65 degrees flexion  PT - End of Session  Equipment Utilized During Treatment Gait belt  Activity Tolerance Patient tolerated treatment well  Patient left with call bell/phone within reach;with chair alarm set;in chair  Nurse Communication Mobility status   PT - Assessment/Plan  PT Plan Current plan remains appropriate  PT Visit Diagnosis Difficulty in walking, not elsewhere classified (R26.2)  PT Frequency (ACUTE ONLY) 7X/week  Follow Up Recommendations Follow surgeon's recommendation for DC plan and follow-up therapies  PT equipment None recommended by PT  AM-PAC PT "6 Clicks" Mobility Outcome Measure (Version 2)  Help needed turning from your back to your side while in a flat bed without using bedrails? 3  Help needed moving from lying on your back to sitting on the side of  a flat bed without using bedrails? 3  Help needed moving to and from a bed to a chair (including a wheelchair)? 3  Help needed standing up from a chair using your arms (e.g., wheelchair or bedside chair)? 3  Help needed to walk in hospital room? 3  Help needed climbing 3-5 steps with a railing?  3  6 Click Score 18  Consider Recommendation of Discharge To: Home with [facility]  PT Goal Progression  Progress towards PT goals Progressing toward goals  Acute Rehab PT Goals  PT Goal Formulation With patient  Time For Goal Achievement 08/21/19  Potential to Achieve Goals Good  PT Time Calculation  PT Start Time (ACUTE ONLY) 1227  PT Stop Time (ACUTE ONLY) 1257  PT Time Calculation (min) (ACUTE ONLY) 30 min  PT General Charges  $$ ACUTE PT VISIT 1 Visit  PT Treatments  $Therapeutic Exercise 23-37 mins

## 2019-08-15 NOTE — Plan of Care (Signed)
Pt ready to DC home 

## 2019-08-15 NOTE — Discharge Instructions (Signed)
INSTRUCTIONS AFTER JOINT REPLACEMENT   o Remove items at home which could result in a fall. This includes throw rugs or furniture in walking pathways o You may notice swelling that will progress down to the foot and ankle.  This is normal after surgery.  Elevate your leg when you are not up walking on it.   o Continue to use the breathing machine you got in the hospital (incentive spirometer) which will help keep your temperature down.  It is common for your temperature to cycle up and down following surgery, especially at night when you are not up moving around and exerting yourself.  The breathing machine keeps your lungs expanded and your temperature down. o GET UP AND WALK EVERY HOUR o STAIRS ARE GOOD PRACTICE FOR BALANCE o DO NOT TAKE YOU BLOOD PRESSURE MEDICINE UNTIL BLOOD PRESSURE IS HIGHER THAN 140/90.  BLOOD PRESSURE IS NATURALLY LOWER AFTER SURGERY AND TAKING THE MEDICINE CAN MAKE IT TOO LOW. o NO ICE UNTIL 7-10 DAYS AFTER SURGERY.  IT CAN IMPAIR WOUND HEALING AND CAUSE FROSTBITE AND DAMAGE THE SKIN AROUND THE SURGICAL INCISION   DIET:  As you were doing prior to hospitalization, we recommend a well-balanced diet.  DRESSING / WOUND CARE / SHOWERING  Keep the surgical dressing until follow up.  The dressing is water proof, so you can shower without any extra covering.  IF THE DRESSING FALLS OFF or the wound gets wet inside, change the dressing with sterile gauze.  Please use good hand washing techniques before changing the dressing.  Do not use any lotions or creams on the incision until instructed by your surgeon.    ACTIVITY  o Increase activity slowly as tolerated, but follow the weight bearing instructions below.   o No driving for 6 weeks or until further direction given by your physician.  You cannot drive while taking narcotics.  o No lifting or carrying greater than 10 lbs. until further directed by your surgeon. o Avoid periods of inactivity such as sitting longer than an hour  when not asleep. This helps prevent blood clots.  o You may return to work once you are authorized by your doctor.     WEIGHT BEARING   Weight bearing as tolerated with assist device (walker, cane, etc) as directed, use it as long as suggested by your surgeon or therapist, typically at least 4-6 weeks.   EXERCISES  Results after joint replacement surgery are often greatly improved when you follow the exercise, range of motion and muscle strengthening exercises prescribed by your doctor. Safety measures are also important to protect the joint from further injury. Any time any of these exercises cause you to have increased pain or swelling, decrease what you are doing until you are comfortable again and then slowly increase them. If you have problems or questions, call your caregiver or physical therapist for advice.   Rehabilitation is important following a joint replacement. After just a few days of immobilization, the muscles of the leg can become weakened and shrink (atrophy).  These exercises are designed to build up the tone and strength of the thigh and leg muscles and to improve motion. Often times heat used for twenty to thirty minutes before working out will loosen up your tissues and help with improving the range of motion but do not use heat for the first two weeks following surgery (sometimes heat can increase post-operative swelling).   These exercises can be done on a training (exercise) mat, on the floor,  on a table or on a bed. Use whatever works the best and is most comfortable for you.    Use music or television while you are exercising so that the exercises are a pleasant break in your day. This will make your life better with the exercises acting as a break in your routine that you can look forward to.   Perform all exercises about fifteen times, three times per day or as directed.  You should exercise both the operative leg and the other leg as well.  Exercises include:    . Quad Sets - Tighten up the muscle on the front of the thigh (Quad) and hold for 5-10 seconds.   . Straight Leg Raises - With your knee straight (if you were given a brace, keep it on), lift the leg to 60 degrees, hold for 3 seconds, and slowly lower the leg.  Perform this exercise against resistance later as your leg gets stronger.  . Leg Slides: Lying on your back, slowly slide your foot toward your buttocks, bending your knee up off the floor (only go as far as is comfortable). Then slowly slide your foot back down until your leg is flat on the floor again.  Glenard Haring Wings: Lying on your back spread your legs to the side as far apart as you can without causing discomfort.  . Hamstring Strength:  Lying on your back, push your heel against the floor with your leg straight by tightening up the muscles of your buttocks.  Repeat, but this time bend your knee to a comfortable angle, and push your heel against the floor.  You may put a pillow under the heel to make it more comfortable if necessary.   A rehabilitation program following joint replacement surgery can speed recovery and prevent re-injury in the future due to weakened muscles. Contact your doctor or a physical therapist for more information on knee rehabilitation.    CONSTIPATION  Constipation is defined medically as fewer than three stools per week and severe constipation as less than one stool per week.  Even if you have a regular bowel pattern at home, your normal regimen is likely to be disrupted due to multiple reasons following surgery.  Combination of anesthesia, postoperative narcotics, change in appetite and fluid intake all can affect your bowels.   YOU MUST use at least one of the following options; they are listed in order of increasing strength to get the job done.  They are all available over the counter, and you may need to use some, POSSIBLY even all of these options:    Drink plenty of fluids (prune juice may be helpful) and  high fiber foods Colace 100 mg by mouth twice a day  Senokot for constipation as directed and as needed Dulcolax (bisacodyl), take with full glass of water  Miralax (polyethylene glycol) once or twice a day as needed.  If you have tried all these things and are unable to have a bowel movement in the first 3-4 days after surgery call either your surgeon or your primary doctor.    If you experience loose stools or diarrhea, hold the medications until you stool forms back up.  If your symptoms do not get better within 1 week or if they get worse, check with your doctor.  If you experience "the worst abdominal pain ever" or develop nausea or vomiting, please contact the office immediately for further recommendations for treatment.   ITCHING:  If you experience itching with your  medications, try taking only a single pain pill, or even half a pain pill at a time.  You can also use Benadryl over the counter for itching or also to help with sleep.   TED HOSE STOCKINGS:  Use stockings on both legs until for at least 2 weeks or as directed by physician office. They may be removed at night for sleeping.  MEDICATIONS:  See your medication summary on the "After Visit Summary" that nursing will review with you.  You may have some home medications which will be placed on hold until you complete the course of blood thinner medication.  It is important for you to complete the blood thinner medication as prescribed.  PRECAUTIONS:  If you experience chest pain or shortness of breath - call 911 immediately for transfer to the hospital emergency department.   If you develop a fever greater that 101 F, purulent drainage from wound, increased redness or drainage from wound, foul odor from the wound/dressing, or calf pain - CONTACT YOUR SURGEON.                                                   FOLLOW-UP APPOINTMENTS:  If you do not already have a post-op appointment, please call the office for an appointment to be seen  by your surgeon.  Guidelines for how soon to be seen are listed in your "After Visit Summary", but are typically between 1-4 weeks after surgery.  OTHER INSTRUCTIONS:   Knee Replacement:  Do not place pillow under knee, focus on keeping the knee straight while resting. CPM instructions: 0-90 degrees, 2 hours in the morning, 2 hours in the afternoon, and 2 hours in the evening. Place foam block, curve side up under heel at all times except when in CPM or when walking.  DO NOT modify, tear, cut, or change the foam block in any way.   DENTAL ANTIBIOTICS:  In most cases prophylactic antibiotics for Dental procdeures after total joint surgery are not necessary.  Exceptions are as follows:  1. History of prior total joint infection  2. Severely immunocompromised (Organ Transplant, cancer chemotherapy, Rheumatoid biologic meds such as [place])  3. Poorly controlled diabetes (A1C &gt; 8.0, blood glucose over 200)  If you have one of these conditions, contact your surgeon for an antibiotic prescription, prior to your dental procedure.   MAKE SURE YOU:  . Understand these instructions.  . Get help right away if you are not doing well or get worse.    Thank you for letting us be a part of your medical care team.  It is a privilege we respect greatly.  We hope these instructions will help you stay on track for a fast and full recovery!

## 2019-08-15 NOTE — Progress Notes (Signed)
Physical Therapy Treatment Patient Details Name: Bailey [name] MRN: [id number] DOB: 1950-12-18 Today's Date: 08/15/2019    History of Present Illness s/p R TKA    PT Comments    Pt progressing well, reviewed, gait/stairs,  Will see again for review of TKA/HEP.     Follow Up Recommendations  Follow surgeon's recommendation for DC plan and follow-up therapies     Equipment Recommendations  None recommended by PT    Recommendations for Other Services       Precautions / Restrictions Precautions Precautions: Knee;Fall Restrictions Other Position/Activity Restrictions: WBAT    Mobility  Bed Mobility         Supine to sit: Min assist     General bed mobility comments: assist with RLE, incr time  Transfers Overall transfer level: Needs assistance Equipment used: Rolling walker (2 wheeled) Transfers: Sit to/from Stand Sit to Stand: Min guard         General transfer comment: cues for hand placement and RLE position, assist to power up to stand  Ambulation/Gait Ambulation/Gait assistance: Min guard   Assistive device: Rolling walker (2 wheeled) Gait Pattern/deviations: Step-to pattern;Decreased stance time - right     General Gait Details: cues for sequence and RW position.   Stairs Stairs: Yes Stairs assistance: Min guard Stair Management: One rail Left;Step to pattern;With cane;Forwards Number of Stairs: 5 (x2) General stair comments: cues for sequence and technique,  pt husband present, able to return demo on second trial   Wheelchair Mobility    Modified Rankin (Stroke Patients Only)       Balance                                            Cognition Arousal/Alertness: Awake/alert Behavior During Therapy: WFL for tasks assessed/performed Overall Cognitive Status: Within Functional Limits for tasks assessed                                        Exercises      General Comments        Pertinent  Vitals/Pain Pain Assessment: 0-10 Pain Score: 4  Pain Location: right knee Pain Descriptors / Indicators: Sore Pain Intervention(s): Limited activity within patient's tolerance;Monitored during session;Premedicated before session;Repositioned    Home Living                      Prior Function            PT Goals (current goals can now be found in the care plan section) Acute Rehab PT Goals Patient Stated Goal: home PT Goal Formulation: With patient Time For Goal Achievement: 08/21/19 Potential to Achieve Goals: Good Progress towards PT goals: Progressing toward goals    Frequency    7X/week      PT Plan Current plan remains appropriate    Co-evaluation              AM-PAC PT "6 Clicks" Mobility   Outcome Measure  Help needed turning from your back to your side while in a flat bed without using bedrails?: A Little Help needed moving from lying on your back to sitting on the side of a flat bed without using bedrails?: A Little Help needed moving to and from a bed to a chair (including  a wheelchair)?: A Little Help needed standing up from a chair using your arms (e.g., wheelchair or bedside chair)?: A Little Help needed to walk in hospital room?: A Little Help needed climbing 3-5 steps with a railing? : A Little 6 Click Score: 18    End of Session Equipment Utilized During Treatment: Gait belt Activity Tolerance: Patient tolerated treatment well Patient left: in chair;with call bell/phone within reach;with chair alarm set Nurse Communication: Mobility status PT Visit Diagnosis: Difficulty in walking, not elsewhere classified (R26.2)     Time: 1110-1146 PT Time Calculation (min) (ACUTE ONLY): 36 min  Charges:  $Gait Training: 23-37 mins                     Baxter Flattery, PT  Acute Rehab Dept ([place]) [phone] Pager [phone]  08/15/2019    [facility] 08/15/2019, 11:59 AM

## 2019-08-16 DIAGNOSIS — I10 Essential (primary) hypertension: Secondary | ICD-10-CM | POA: Diagnosis not present

## 2019-08-16 DIAGNOSIS — Z471 Aftercare following joint replacement surgery: Secondary | ICD-10-CM | POA: Diagnosis not present

## 2019-08-16 DIAGNOSIS — K5792 Diverticulitis of intestine, part unspecified, without perforation or abscess without bleeding: Secondary | ICD-10-CM | POA: Diagnosis not present

## 2019-08-16 DIAGNOSIS — Z96651 Presence of right artificial knee joint: Secondary | ICD-10-CM | POA: Diagnosis not present

## 2019-08-16 DIAGNOSIS — G629 Polyneuropathy, unspecified: Secondary | ICD-10-CM | POA: Diagnosis not present

## 2019-08-16 DIAGNOSIS — E785 Hyperlipidemia, unspecified: Secondary | ICD-10-CM | POA: Diagnosis not present

## 2019-08-16 DIAGNOSIS — E559 Vitamin D deficiency, unspecified: Secondary | ICD-10-CM | POA: Diagnosis not present

## 2019-08-16 DIAGNOSIS — Z780 Asymptomatic menopausal state: Secondary | ICD-10-CM | POA: Diagnosis not present

## 2019-08-16 DIAGNOSIS — K5909 Other constipation: Secondary | ICD-10-CM | POA: Diagnosis not present

## 2019-08-16 DIAGNOSIS — K8020 Calculus of gallbladder without cholecystitis without obstruction: Secondary | ICD-10-CM | POA: Diagnosis not present

## 2019-08-17 DIAGNOSIS — Z780 Asymptomatic menopausal state: Secondary | ICD-10-CM | POA: Diagnosis not present

## 2019-08-17 DIAGNOSIS — I10 Essential (primary) hypertension: Secondary | ICD-10-CM | POA: Diagnosis not present

## 2019-08-17 DIAGNOSIS — E559 Vitamin D deficiency, unspecified: Secondary | ICD-10-CM | POA: Diagnosis not present

## 2019-08-17 DIAGNOSIS — K5909 Other constipation: Secondary | ICD-10-CM | POA: Diagnosis not present

## 2019-08-17 DIAGNOSIS — K8020 Calculus of gallbladder without cholecystitis without obstruction: Secondary | ICD-10-CM | POA: Diagnosis not present

## 2019-08-17 DIAGNOSIS — G629 Polyneuropathy, unspecified: Secondary | ICD-10-CM | POA: Diagnosis not present

## 2019-08-17 DIAGNOSIS — Z471 Aftercare following joint replacement surgery: Secondary | ICD-10-CM | POA: Diagnosis not present

## 2019-08-17 DIAGNOSIS — Z96651 Presence of right artificial knee joint: Secondary | ICD-10-CM | POA: Diagnosis not present

## 2019-08-17 DIAGNOSIS — E785 Hyperlipidemia, unspecified: Secondary | ICD-10-CM | POA: Diagnosis not present

## 2019-08-17 DIAGNOSIS — K5792 Diverticulitis of intestine, part unspecified, without perforation or abscess without bleeding: Secondary | ICD-10-CM | POA: Diagnosis not present

## 2019-08-18 DIAGNOSIS — K5909 Other constipation: Secondary | ICD-10-CM | POA: Diagnosis not present

## 2019-08-18 DIAGNOSIS — G629 Polyneuropathy, unspecified: Secondary | ICD-10-CM | POA: Diagnosis not present

## 2019-08-18 DIAGNOSIS — Z471 Aftercare following joint replacement surgery: Secondary | ICD-10-CM | POA: Diagnosis not present

## 2019-08-18 DIAGNOSIS — Z780 Asymptomatic menopausal state: Secondary | ICD-10-CM | POA: Diagnosis not present

## 2019-08-18 DIAGNOSIS — E559 Vitamin D deficiency, unspecified: Secondary | ICD-10-CM | POA: Diagnosis not present

## 2019-08-18 DIAGNOSIS — E785 Hyperlipidemia, unspecified: Secondary | ICD-10-CM | POA: Diagnosis not present

## 2019-08-18 DIAGNOSIS — I10 Essential (primary) hypertension: Secondary | ICD-10-CM | POA: Diagnosis not present

## 2019-08-18 DIAGNOSIS — K8020 Calculus of gallbladder without cholecystitis without obstruction: Secondary | ICD-10-CM | POA: Diagnosis not present

## 2019-08-18 DIAGNOSIS — K5792 Diverticulitis of intestine, part unspecified, without perforation or abscess without bleeding: Secondary | ICD-10-CM | POA: Diagnosis not present

## 2019-08-18 DIAGNOSIS — Z96651 Presence of right artificial knee joint: Secondary | ICD-10-CM | POA: Diagnosis not present

## 2019-08-20 ENCOUNTER — Ambulatory Visit (HOSPITAL_COMMUNITY)
Admission: RE | Admit: 2019-08-20 | Discharge: 2019-08-20 | Disposition: A | Discharge status: Home / Self Care | Payer: Medicare Other | Source: Ambulatory Visit | Attending: Physician Assistant | Admitting: Physician Assistant

## 2019-08-20 ENCOUNTER — Other Ambulatory Visit: Payer: Self-pay | Admitting: Physician Assistant

## 2019-08-20 DIAGNOSIS — M1711 Unilateral primary osteoarthritis, right knee: Secondary | ICD-10-CM | POA: Diagnosis not present

## 2019-08-20 DIAGNOSIS — M79604 Pain in right leg: Secondary | ICD-10-CM | POA: Diagnosis not present

## 2019-08-20 NOTE — Progress Notes (Signed)
Right leg pain and swelling  Worse since last night    Surgery total knee replacement on Monday

## 2019-08-20 NOTE — Progress Notes (Signed)
VASCULAR LAB PRELIMINARY  PRELIMINARY  PRELIMINARY  PRELIMINARY  Right lower extremity venous duplex completed.    Preliminary report:  See CV proc for preliminary results.  Called [place], [place] with results.  Grecia Lynk, RVT 08/20/2019, 3:19 PM

## 2019-08-21 DIAGNOSIS — I10 Essential (primary) hypertension: Secondary | ICD-10-CM | POA: Diagnosis not present

## 2019-08-21 DIAGNOSIS — G629 Polyneuropathy, unspecified: Secondary | ICD-10-CM | POA: Diagnosis not present

## 2019-08-21 DIAGNOSIS — K5909 Other constipation: Secondary | ICD-10-CM | POA: Diagnosis not present

## 2019-08-21 DIAGNOSIS — M1711 Unilateral primary osteoarthritis, right knee: Secondary | ICD-10-CM | POA: Diagnosis not present

## 2019-08-21 DIAGNOSIS — K5792 Diverticulitis of intestine, part unspecified, without perforation or abscess without bleeding: Secondary | ICD-10-CM | POA: Diagnosis not present

## 2019-08-21 DIAGNOSIS — E559 Vitamin D deficiency, unspecified: Secondary | ICD-10-CM | POA: Diagnosis not present

## 2019-08-21 DIAGNOSIS — Z96651 Presence of right artificial knee joint: Secondary | ICD-10-CM | POA: Diagnosis not present

## 2019-08-21 DIAGNOSIS — Z471 Aftercare following joint replacement surgery: Secondary | ICD-10-CM | POA: Diagnosis not present

## 2019-08-21 DIAGNOSIS — E785 Hyperlipidemia, unspecified: Secondary | ICD-10-CM | POA: Diagnosis not present

## 2019-08-21 DIAGNOSIS — Z780 Asymptomatic menopausal state: Secondary | ICD-10-CM | POA: Diagnosis not present

## 2019-08-21 DIAGNOSIS — K8020 Calculus of gallbladder without cholecystitis without obstruction: Secondary | ICD-10-CM | POA: Diagnosis not present

## 2019-08-23 DIAGNOSIS — Z471 Aftercare following joint replacement surgery: Secondary | ICD-10-CM | POA: Diagnosis not present

## 2019-08-23 DIAGNOSIS — G629 Polyneuropathy, unspecified: Secondary | ICD-10-CM | POA: Diagnosis not present

## 2019-08-23 DIAGNOSIS — I10 Essential (primary) hypertension: Secondary | ICD-10-CM | POA: Diagnosis not present

## 2019-08-23 DIAGNOSIS — K5792 Diverticulitis of intestine, part unspecified, without perforation or abscess without bleeding: Secondary | ICD-10-CM | POA: Diagnosis not present

## 2019-08-23 DIAGNOSIS — E785 Hyperlipidemia, unspecified: Secondary | ICD-10-CM | POA: Diagnosis not present

## 2019-08-23 DIAGNOSIS — Z780 Asymptomatic menopausal state: Secondary | ICD-10-CM | POA: Diagnosis not present

## 2019-08-23 DIAGNOSIS — K8020 Calculus of gallbladder without cholecystitis without obstruction: Secondary | ICD-10-CM | POA: Diagnosis not present

## 2019-08-23 DIAGNOSIS — K5909 Other constipation: Secondary | ICD-10-CM | POA: Diagnosis not present

## 2019-08-23 DIAGNOSIS — E559 Vitamin D deficiency, unspecified: Secondary | ICD-10-CM | POA: Diagnosis not present

## 2019-08-23 DIAGNOSIS — Z96651 Presence of right artificial knee joint: Secondary | ICD-10-CM | POA: Diagnosis not present

## 2019-08-24 DIAGNOSIS — M25561 Pain in right knee: Secondary | ICD-10-CM | POA: Diagnosis not present

## 2019-08-24 DIAGNOSIS — M1711 Unilateral primary osteoarthritis, right knee: Secondary | ICD-10-CM | POA: Diagnosis not present

## 2019-08-24 DIAGNOSIS — M25661 Stiffness of right knee, not elsewhere classified: Secondary | ICD-10-CM | POA: Diagnosis not present

## 2019-08-24 DIAGNOSIS — R262 Difficulty in walking, not elsewhere classified: Secondary | ICD-10-CM | POA: Diagnosis not present

## 2019-08-28 DIAGNOSIS — M25561 Pain in right knee: Secondary | ICD-10-CM | POA: Diagnosis not present

## 2019-08-28 DIAGNOSIS — M1711 Unilateral primary osteoarthritis, right knee: Secondary | ICD-10-CM | POA: Diagnosis not present

## 2019-08-28 DIAGNOSIS — R262 Difficulty in walking, not elsewhere classified: Secondary | ICD-10-CM | POA: Diagnosis not present

## 2019-08-28 DIAGNOSIS — M25661 Stiffness of right knee, not elsewhere classified: Secondary | ICD-10-CM | POA: Diagnosis not present

## 2019-08-30 ENCOUNTER — Other Ambulatory Visit (HOSPITAL_COMMUNITY): Payer: Self-pay | Admitting: Orthopedic Surgery

## 2019-08-30 DIAGNOSIS — M25561 Pain in right knee: Secondary | ICD-10-CM | POA: Diagnosis not present

## 2019-08-30 DIAGNOSIS — M79604 Pain in right leg: Secondary | ICD-10-CM

## 2019-08-30 DIAGNOSIS — M25661 Stiffness of right knee, not elsewhere classified: Secondary | ICD-10-CM | POA: Diagnosis not present

## 2019-08-30 DIAGNOSIS — R262 Difficulty in walking, not elsewhere classified: Secondary | ICD-10-CM | POA: Diagnosis not present

## 2019-08-30 DIAGNOSIS — M1711 Unilateral primary osteoarthritis, right knee: Secondary | ICD-10-CM | POA: Diagnosis not present

## 2019-08-31 ENCOUNTER — Other Ambulatory Visit: Payer: Self-pay

## 2019-08-31 ENCOUNTER — Ambulatory Visit (HOSPITAL_COMMUNITY)
Admission: RE | Admit: 2019-08-31 | Discharge: 2019-08-31 | Disposition: A | Discharge status: Home / Self Care | Payer: Medicare Other | Source: Ambulatory Visit | Attending: Orthopedic Surgery | Admitting: Orthopedic Surgery

## 2019-08-31 DIAGNOSIS — M79604 Pain in right leg: Secondary | ICD-10-CM | POA: Diagnosis not present

## 2019-08-31 DIAGNOSIS — M7989 Other specified soft tissue disorders: Secondary | ICD-10-CM

## 2019-08-31 DIAGNOSIS — M1711 Unilateral primary osteoarthritis, right knee: Secondary | ICD-10-CM | POA: Diagnosis not present

## 2019-09-01 DIAGNOSIS — R262 Difficulty in walking, not elsewhere classified: Secondary | ICD-10-CM | POA: Diagnosis not present

## 2019-09-01 DIAGNOSIS — M1711 Unilateral primary osteoarthritis, right knee: Secondary | ICD-10-CM | POA: Diagnosis not present

## 2019-09-01 DIAGNOSIS — M25561 Pain in right knee: Secondary | ICD-10-CM | POA: Diagnosis not present

## 2019-09-01 DIAGNOSIS — M25661 Stiffness of right knee, not elsewhere classified: Secondary | ICD-10-CM | POA: Diagnosis not present

## 2019-09-06 DIAGNOSIS — M1711 Unilateral primary osteoarthritis, right knee: Secondary | ICD-10-CM | POA: Diagnosis not present

## 2019-09-06 DIAGNOSIS — M25561 Pain in right knee: Secondary | ICD-10-CM | POA: Diagnosis not present

## 2019-09-06 DIAGNOSIS — M25661 Stiffness of right knee, not elsewhere classified: Secondary | ICD-10-CM | POA: Diagnosis not present

## 2019-09-06 DIAGNOSIS — R262 Difficulty in walking, not elsewhere classified: Secondary | ICD-10-CM | POA: Diagnosis not present

## 2019-09-08 ENCOUNTER — Encounter: Payer: Medicare Other | Attending: Family Medicine | Admitting: Registered"

## 2019-09-08 ENCOUNTER — Encounter: Payer: Self-pay | Admitting: Registered"

## 2019-09-08 DIAGNOSIS — R7303 Prediabetes: Secondary | ICD-10-CM | POA: Diagnosis not present

## 2019-09-08 NOTE — Progress Notes (Signed)
On 09/08/19 pt completed a post core session of the Diabetes Prevention Program course virtually with [facility]. By the end of this session patients are able to complete the following objectives:   Learning Objectives:  Describe the difference between a lapse and a relapse.  List steps to prevent a lapse from becoming a relapse.   Identify situations that increase risk of having a lapse.   Make a plan to help prevent lapses and recover after a lapse has occurred.   Goals:   Record weight taken outside of class.   Track foods and beverages eaten each day in the "Food and Activity Tracker," including calories and fat grams for each item.    Track activity type, minutes you were active, and distance you reached each day in the "Food and Activity Tracker."   Follow-Up Plan:  Attend next session.  Email completed "Food and Activity Trackers" before next session to be reviewed by [facility].

## 2019-09-11 DIAGNOSIS — M1711 Unilateral primary osteoarthritis, right knee: Secondary | ICD-10-CM | POA: Diagnosis not present

## 2019-09-11 DIAGNOSIS — R262 Difficulty in walking, not elsewhere classified: Secondary | ICD-10-CM | POA: Diagnosis not present

## 2019-09-11 DIAGNOSIS — M25561 Pain in right knee: Secondary | ICD-10-CM | POA: Diagnosis not present

## 2019-09-11 DIAGNOSIS — M25661 Stiffness of right knee, not elsewhere classified: Secondary | ICD-10-CM | POA: Diagnosis not present

## 2019-09-12 DIAGNOSIS — M1711 Unilateral primary osteoarthritis, right knee: Secondary | ICD-10-CM | POA: Diagnosis not present

## 2019-09-13 DIAGNOSIS — R262 Difficulty in walking, not elsewhere classified: Secondary | ICD-10-CM | POA: Diagnosis not present

## 2019-09-13 DIAGNOSIS — M1711 Unilateral primary osteoarthritis, right knee: Secondary | ICD-10-CM | POA: Diagnosis not present

## 2019-09-13 DIAGNOSIS — M25561 Pain in right knee: Secondary | ICD-10-CM | POA: Diagnosis not present

## 2019-09-13 DIAGNOSIS — M25661 Stiffness of right knee, not elsewhere classified: Secondary | ICD-10-CM | POA: Diagnosis not present

## 2019-09-15 DIAGNOSIS — M25661 Stiffness of right knee, not elsewhere classified: Secondary | ICD-10-CM | POA: Diagnosis not present

## 2019-09-15 DIAGNOSIS — M1711 Unilateral primary osteoarthritis, right knee: Secondary | ICD-10-CM | POA: Diagnosis not present

## 2019-09-15 DIAGNOSIS — M25561 Pain in right knee: Secondary | ICD-10-CM | POA: Diagnosis not present

## 2019-09-15 DIAGNOSIS — R262 Difficulty in walking, not elsewhere classified: Secondary | ICD-10-CM | POA: Diagnosis not present

## 2019-09-18 DIAGNOSIS — M1711 Unilateral primary osteoarthritis, right knee: Secondary | ICD-10-CM | POA: Diagnosis not present

## 2019-09-18 DIAGNOSIS — R262 Difficulty in walking, not elsewhere classified: Secondary | ICD-10-CM | POA: Diagnosis not present

## 2019-09-18 DIAGNOSIS — M25561 Pain in right knee: Secondary | ICD-10-CM | POA: Diagnosis not present

## 2019-09-18 DIAGNOSIS — M25661 Stiffness of right knee, not elsewhere classified: Secondary | ICD-10-CM | POA: Diagnosis not present

## 2019-09-20 DIAGNOSIS — M1711 Unilateral primary osteoarthritis, right knee: Secondary | ICD-10-CM | POA: Diagnosis not present

## 2019-09-20 DIAGNOSIS — M25661 Stiffness of right knee, not elsewhere classified: Secondary | ICD-10-CM | POA: Diagnosis not present

## 2019-09-20 DIAGNOSIS — R262 Difficulty in walking, not elsewhere classified: Secondary | ICD-10-CM | POA: Diagnosis not present

## 2019-09-20 DIAGNOSIS — M25561 Pain in right knee: Secondary | ICD-10-CM | POA: Diagnosis not present

## 2019-09-22 DIAGNOSIS — M25661 Stiffness of right knee, not elsewhere classified: Secondary | ICD-10-CM | POA: Diagnosis not present

## 2019-09-22 DIAGNOSIS — M1711 Unilateral primary osteoarthritis, right knee: Secondary | ICD-10-CM | POA: Diagnosis not present

## 2019-09-22 DIAGNOSIS — M25561 Pain in right knee: Secondary | ICD-10-CM | POA: Diagnosis not present

## 2019-09-22 DIAGNOSIS — R262 Difficulty in walking, not elsewhere classified: Secondary | ICD-10-CM | POA: Diagnosis not present

## 2019-09-24 ENCOUNTER — Other Ambulatory Visit: Payer: Self-pay | Admitting: Internal Medicine

## 2019-09-24 DIAGNOSIS — E785 Hyperlipidemia, unspecified: Secondary | ICD-10-CM

## 2019-09-26 DIAGNOSIS — M25661 Stiffness of right knee, not elsewhere classified: Secondary | ICD-10-CM | POA: Diagnosis not present

## 2019-09-26 DIAGNOSIS — R262 Difficulty in walking, not elsewhere classified: Secondary | ICD-10-CM | POA: Diagnosis not present

## 2019-09-26 DIAGNOSIS — M1711 Unilateral primary osteoarthritis, right knee: Secondary | ICD-10-CM | POA: Diagnosis not present

## 2019-09-28 DIAGNOSIS — R262 Difficulty in walking, not elsewhere classified: Secondary | ICD-10-CM | POA: Diagnosis not present

## 2019-09-28 DIAGNOSIS — M1711 Unilateral primary osteoarthritis, right knee: Secondary | ICD-10-CM | POA: Diagnosis not present

## 2019-09-28 DIAGNOSIS — M25561 Pain in right knee: Secondary | ICD-10-CM | POA: Diagnosis not present

## 2019-09-28 DIAGNOSIS — M25661 Stiffness of right knee, not elsewhere classified: Secondary | ICD-10-CM | POA: Diagnosis not present

## 2019-09-29 DIAGNOSIS — R262 Difficulty in walking, not elsewhere classified: Secondary | ICD-10-CM | POA: Diagnosis not present

## 2019-09-29 DIAGNOSIS — M1711 Unilateral primary osteoarthritis, right knee: Secondary | ICD-10-CM | POA: Diagnosis not present

## 2019-09-29 DIAGNOSIS — M25661 Stiffness of right knee, not elsewhere classified: Secondary | ICD-10-CM | POA: Diagnosis not present

## 2019-09-29 DIAGNOSIS — M25561 Pain in right knee: Secondary | ICD-10-CM | POA: Diagnosis not present

## 2019-10-04 DIAGNOSIS — R262 Difficulty in walking, not elsewhere classified: Secondary | ICD-10-CM | POA: Diagnosis not present

## 2019-10-04 DIAGNOSIS — M25661 Stiffness of right knee, not elsewhere classified: Secondary | ICD-10-CM | POA: Diagnosis not present

## 2019-10-04 DIAGNOSIS — M25561 Pain in right knee: Secondary | ICD-10-CM | POA: Diagnosis not present

## 2019-10-04 DIAGNOSIS — M1711 Unilateral primary osteoarthritis, right knee: Secondary | ICD-10-CM | POA: Diagnosis not present

## 2019-10-06 ENCOUNTER — Encounter: Payer: Medicare Other | Attending: Family Medicine | Admitting: Registered"

## 2019-10-06 DIAGNOSIS — M1711 Unilateral primary osteoarthritis, right knee: Secondary | ICD-10-CM | POA: Diagnosis not present

## 2019-10-06 DIAGNOSIS — R7303 Prediabetes: Secondary | ICD-10-CM | POA: Insufficient documentation

## 2019-10-06 DIAGNOSIS — M25661 Stiffness of right knee, not elsewhere classified: Secondary | ICD-10-CM | POA: Diagnosis not present

## 2019-10-06 DIAGNOSIS — M25561 Pain in right knee: Secondary | ICD-10-CM | POA: Diagnosis not present

## 2019-10-06 DIAGNOSIS — R262 Difficulty in walking, not elsewhere classified: Secondary | ICD-10-CM | POA: Diagnosis not present

## 2019-10-09 DIAGNOSIS — R262 Difficulty in walking, not elsewhere classified: Secondary | ICD-10-CM | POA: Diagnosis not present

## 2019-10-09 DIAGNOSIS — M25661 Stiffness of right knee, not elsewhere classified: Secondary | ICD-10-CM | POA: Diagnosis not present

## 2019-10-09 DIAGNOSIS — M25561 Pain in right knee: Secondary | ICD-10-CM | POA: Diagnosis not present

## 2019-10-09 DIAGNOSIS — M1711 Unilateral primary osteoarthritis, right knee: Secondary | ICD-10-CM | POA: Diagnosis not present

## 2019-10-10 ENCOUNTER — Ambulatory Visit: Payer: Medicare Other | Admitting: Internal Medicine

## 2019-10-11 DIAGNOSIS — M25661 Stiffness of right knee, not elsewhere classified: Secondary | ICD-10-CM | POA: Diagnosis not present

## 2019-10-11 DIAGNOSIS — M1711 Unilateral primary osteoarthritis, right knee: Secondary | ICD-10-CM | POA: Diagnosis not present

## 2019-10-11 DIAGNOSIS — M25561 Pain in right knee: Secondary | ICD-10-CM | POA: Diagnosis not present

## 2019-10-11 DIAGNOSIS — R262 Difficulty in walking, not elsewhere classified: Secondary | ICD-10-CM | POA: Diagnosis not present

## 2019-10-14 NOTE — Progress Notes (Addendum)
On 10/06/19 pt completed a post core session of the Diabetes Prevention Program course virtually with [facility]. By the end of this session patients are able to complete the following objectives:   Virtual Visit via Video Note  I connected with Bailey [name] on 10/06/19 at  1:45 PM EDT by a video enabled application and verified that I am speaking with the correct person using two identifiers.  Location: Patient: Patient's home.  Provider: Provider's office at [facility].   Learning Objectives:  Reflect on lifestyle changes they have made since starting the DPP.   Set long-term goals to promote continued maintenance of lifestyle changes made during the program.   Goals:   Work toward reaching new long-term goals set during class.   Follow-Up Plan:  Contact [facility]estyle [facility] with questions/concerns PRN.

## 2019-10-17 DIAGNOSIS — R262 Difficulty in walking, not elsewhere classified: Secondary | ICD-10-CM | POA: Diagnosis not present

## 2019-10-17 DIAGNOSIS — M1711 Unilateral primary osteoarthritis, right knee: Secondary | ICD-10-CM | POA: Diagnosis not present

## 2019-10-17 DIAGNOSIS — M25661 Stiffness of right knee, not elsewhere classified: Secondary | ICD-10-CM | POA: Diagnosis not present

## 2019-10-19 DIAGNOSIS — M25661 Stiffness of right knee, not elsewhere classified: Secondary | ICD-10-CM | POA: Diagnosis not present

## 2019-10-19 DIAGNOSIS — R262 Difficulty in walking, not elsewhere classified: Secondary | ICD-10-CM | POA: Diagnosis not present

## 2019-10-19 DIAGNOSIS — M1711 Unilateral primary osteoarthritis, right knee: Secondary | ICD-10-CM | POA: Diagnosis not present

## 2019-10-19 DIAGNOSIS — M25561 Pain in right knee: Secondary | ICD-10-CM | POA: Diagnosis not present

## 2019-10-23 ENCOUNTER — Ambulatory Visit (INDEPENDENT_AMBULATORY_CARE_PROVIDER_SITE_OTHER): Payer: Medicare Other | Admitting: Internal Medicine

## 2019-10-23 ENCOUNTER — Other Ambulatory Visit: Payer: Self-pay

## 2019-10-23 ENCOUNTER — Encounter: Payer: Self-pay | Admitting: Internal Medicine

## 2019-10-23 VITALS — BP 128/70 | HR 51 | Temp 98.4°F | Ht 65.0 in | Wt 192.4 lb

## 2019-10-23 DIAGNOSIS — E559 Vitamin D deficiency, unspecified: Secondary | ICD-10-CM

## 2019-10-23 DIAGNOSIS — E785 Hyperlipidemia, unspecified: Secondary | ICD-10-CM | POA: Diagnosis not present

## 2019-10-23 DIAGNOSIS — E7849 Other hyperlipidemia: Secondary | ICD-10-CM | POA: Diagnosis not present

## 2019-10-23 DIAGNOSIS — I10 Essential (primary) hypertension: Secondary | ICD-10-CM | POA: Diagnosis not present

## 2019-10-23 NOTE — Patient Instructions (Addendum)
Cardiac CT calcium scoring test $150 Tel # is [phone]   Computed tomography, more commonly known as a CT or CAT scan, is a diagnostic medical imaging test. Like traditional x-rays, it produces multiple images or pictures of the inside of the body. The cross-sectional images generated during a CT scan can be reformatted in multiple planes. They can even generate three-dimensional images. These images can be viewed on a computer monitor, printed on film or by a 3D printer, or transferred to a CD or DVD. CT images of internal organs, bones, soft tissue and blood vessels provide greater detail than traditional x-rays, particularly of soft tissues and blood vessels. A cardiac CT scan for coronary calcium is a non-invasive way of obtaining information about the presence, location and extent of calcified plaque in the coronary arteries--the vessels that supply oxygen-containing blood to the heart muscle. Calcified plaque results when there is a build-up of fat and other substances under the inner layer of the artery. This material can calcify which signals the presence of atherosclerosis, a disease of the vessel wall, also called coronary artery disease (CAD). People with this disease have an increased risk for heart attacks. In addition, over time, progression of plaque build up (CAD) can narrow the arteries or even close off blood flow to the heart. The result may be chest pain, sometimes called "angina," or a heart attack. Because calcium is a marker of CAD, the amount of calcium detected on a cardiac CT scan is a helpful prognostic tool. The findings on cardiac CT are expressed as a calcium score. Another name for this test is coronary artery calcium scoring.  What are some common uses of the procedure? The goal of cardiac CT scan for calcium scoring is to determine if CAD is present and to what extent, even if there are no symptoms. It is a screening study that may be recommended by a physician for  patients with risk factors for CAD but no clinical symptoms. The major risk factors for CAD are: . high blood cholesterol levels  . family history of heart attacks  . diabetes  . high blood pressure  . cigarette smoking  . overweight or obese  . physical inactivity   A negative cardiac CT scan for calcium scoring shows no calcification within the coronary arteries. This suggests that CAD is absent or so minimal it cannot be seen by this technique. The chance of having a heart attack over the next two to five years is very low under these circumstances. A positive test means that CAD is present, regardless of whether or not the patient is experiencing any symptoms. The amount of calcification--expressed as the calcium score--may help to predict the likelihood of a myocardial infarction (heart attack) in the coming years and helps your medical doctor or cardiologist decide whether the patient may need to take preventive medicine or undertake other measures such as diet and exercise to lower the risk for heart attack. The extent of CAD is graded according to your calcium score:  Calcium Score  Presence of CAD (coronary artery disease)  0 No evidence of CAD   1-10 Minimal evidence of CAD  11-100 Mild evidence of CAD  101-400 Moderate evidence of CAD  Over 400 Extensive evidence of CAD      Reduce Azor to 1/2 tab if low BP

## 2019-10-23 NOTE — Assessment & Plan Note (Signed)
Labs Reduce Azor to 1/2 tab if low BP

## 2019-10-23 NOTE — Progress Notes (Signed)
Subjective:  Patient ID: Bailey [name], female    DOB: 06-16-1950  Age: 69 y.o. MRN: [id number]  CC: Follow-up and Medication Management   HPI Bailey [name] presents for HTN, OA, post-op R knee pain C/o low BP at home  Outpatient Medications Prior to Visit  Medication Sig Dispense Refill  . amLODipine-olmesartan (AZOR) 5-20 MG tablet Take 1 tablet by mouth daily.    Marland Kitchen ezetimibe (ZETIA) 10 MG tablet TAKE 1 TABLET BY MOUTH EVERYDAY AT BEDTIME 90 tablet 0  . gabapentin (NEURONTIN) 300 MG capsule 1 po qhs for nerve pain 30 capsule 0  . polyethylene glycol (MIRALAX / GLYCOLAX) 17 g packet 17grams in 6 oz of something to drink twice a day until bowel movement.  LAXITIVE.  Restart if two days since last bowel movement 14 each 0  . Vitamin D, Ergocalciferol, (DRISDOL) 1.25 MG (50000 UT) CAPS capsule TAKE 1 CAPSULE BY MOUTH EVERY 7 DAYS (Patient taking differently: Take 50,000 Units by mouth every 7 (seven) days. Wednesday) 12 capsule 10  . aspirin EC 325 MG EC tablet 1 tab a day for the next 30 days to prevent blood clots (Patient not taking: Reported on 10/23/2019) 30 tablet 0  . docusate sodium (COLACE) 100 MG capsule 1 tab 2 times a day while on narcotics.  STOOL SOFTENER (Patient not taking: Reported on 10/23/2019) 60 capsule 0  . nortriptyline (PAMELOR) 10 MG capsule Take 1 capsule (10 mg total) by mouth at bedtime. Use for your bladder problem. (Patient not taking: Reported on 10/23/2019) 30 capsule 5  . ondansetron (ZOFRAN) 4 MG tablet Take 1 tablet (4 mg total) by mouth every 6 (six) hours as needed for nausea. (Patient not taking: Reported on 10/23/2019) 20 tablet 0  . oxyCODONE (OXY IR/ROXICODONE) 5 MG immediate release tablet 1 po q 4 hrs prn pain.  Patient had a total knee replacement on 08/14/2019 (Patient not taking: Reported on 10/23/2019) 42 tablet 0   No facility-administered medications prior to visit.    ROS: Review of Systems  Constitutional: Positive for unexpected weight  change. Negative for activity change, appetite change, chills and fatigue.  HENT: Negative for congestion, mouth sores and sinus pressure.   Eyes: Negative for visual disturbance.  Respiratory: Negative for cough and chest tightness.   Gastrointestinal: Negative for abdominal pain and nausea.  Genitourinary: Negative for difficulty urinating, frequency and vaginal pain.  Musculoskeletal: Positive for arthralgias and gait problem. Negative for back pain.  Skin: Negative for pallor and rash.  Neurological: Negative for dizziness, tremors, weakness, numbness and headaches.  Psychiatric/Behavioral: Negative for confusion and sleep disturbance.    Objective:  BP 128/70 (BP Location: Left Arm, Patient Position: Sitting, Cuff Size: Large)   Pulse (!) 51   Temp 98.4 F (36.9 C) (Oral)   Ht 5\' 5"  (1.651 m)   Wt 192 lb 6.4 oz (87.3 kg)   SpO2 98%   BMI 32.02 kg/m   BP Readings from Last 3 Encounters:  10/23/19 128/70  08/15/19 (!) 156/76  08/07/19 (!) 171/91    Wt Readings from Last 3 Encounters:  10/23/19 192 lb 6.4 oz (87.3 kg)  08/14/19 203 lb 4.2 oz (92.2 kg)  08/07/19 203 lb 5 oz (92.2 kg)    Physical Exam Constitutional:      General: She is not in acute distress.    Appearance: She is well-developed.  HENT:     Head: Normocephalic.     Right Ear: External ear normal.  Left Ear: External ear normal.     Nose: Nose normal.  Eyes:     General:        Right eye: No discharge.        Left eye: No discharge.     Conjunctiva/sclera: Conjunctivae normal.     Pupils: Pupils are equal, round, and reactive to light.  Neck:     Thyroid: No thyromegaly.     Vascular: No JVD.     Trachea: No tracheal deviation.  Cardiovascular:     Rate and Rhythm: Normal rate and regular rhythm.     Heart sounds: Normal heart sounds.  Pulmonary:     Effort: No respiratory distress.     Breath sounds: No stridor. No wheezing.  Abdominal:     General: Bowel sounds are normal. There is no  distension.     Palpations: Abdomen is soft. There is no mass.     Tenderness: There is no abdominal tenderness. There is no guarding or rebound.  Musculoskeletal:        General: No tenderness.     Cervical back: Normal range of motion and neck supple.  Lymphadenopathy:     Cervical: No cervical adenopathy.  Skin:    Findings: No erythema or rash.  Neurological:     Cranial Nerves: No cranial nerve deficit.     Motor: No abnormal muscle tone.     Coordination: Coordination normal.     Deep Tendon Reflexes: Reflexes normal.  Psychiatric:        Behavior: Behavior normal.        Thought Content: Thought content normal.        Judgment: Judgment normal.    R knee post-op knee stiffness/scar  Lab Results  Component Value Date   WBC 12.2 (H) 08/15/2019   HGB 12.3 08/15/2019   HCT 35.9 (L) 08/15/2019   PLT 199 08/15/2019   GLUCOSE 164 (H) 08/15/2019   CHOL 220 (H) 07/10/2019   TRIG 202.0 (H) 07/10/2019   HDL 53.70 07/10/2019   LDLDIRECT 132.0 07/10/2019   LDLCALC 84 04/04/2019   ALT 22 08/07/2019   AST 21 08/07/2019   NA 138 08/15/2019   K 4.7 08/15/2019   CL 107 08/15/2019   CREATININE 0.66 08/15/2019   BUN 12 08/15/2019   CO2 25 08/15/2019   TSH 2.500 08/02/2018   INR 1.0 08/07/2019   HGBA1C 5.8 07/10/2019    VAS [place] LOWER EXTREMITY VENOUS (DVT)  Result Date: 08/31/2019  Lower Venous DVTStudy Indications: Swelling, Edema, Pain, and 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  Comparison Study: 08/20/19 - Negative RLE Venous Performing Technologist: Velva Harman Sturdivant RDMS, RVT  Examination Guidelines: A complete evaluation includes B-mode imaging, spectral Doppler, color Doppler, and power Doppler as needed of all accessible portions of each vessel. Bilateral testing is considered an integral part of a complete examination. Limited examinations for reoccurring indications may be performed as noted. The reflux portion of the exam is performed with the patient in reverse  Trendelenburg.  +---------+---------------+---------+-----------+----------+--------------+ RIGHT    CompressibilityPhasicitySpontaneityPropertiesThrombus Aging +---------+---------------+---------+-----------+----------+--------------+ CFV      Full           Yes      Yes                                 +---------+---------------+---------+-----------+----------+--------------+ SFJ      Full                                                        +---------+---------------+---------+-----------+----------+--------------+  FV Prox  Full                                                        +---------+---------------+---------+-----------+----------+--------------+ FV Mid   Full                                                        +---------+---------------+---------+-----------+----------+--------------+ FV DistalFull                                                        +---------+---------------+---------+-----------+----------+--------------+ PFV      Full                                                        +---------+---------------+---------+-----------+----------+--------------+ POP      Full           Yes      Yes                                 +---------+---------------+---------+-----------+----------+--------------+ PTV      Full                                                        +---------+---------------+---------+-----------+----------+--------------+ PERO     Full                                                        +---------+---------------+---------+-----------+----------+--------------+   +----+---------------+---------+-----------+----------+--------------+ LEFTCompressibilityPhasicitySpontaneityPropertiesThrombus Aging +----+---------------+---------+-----------+----------+--------------+ CFV Full           Yes      Yes                                  +----+---------------+---------+-----------+----------+--------------+ SFJ Full                                                        +----+---------------+---------+-----------+----------+--------------+     Summary: RIGHT: - There is no evidence of deep vein thrombosis in the lower extremity.  - A complex structure with mixed echoes in the right popliteal fossa with sonographic characteristics for a rupture Baker's cyst was noted measuring 4.6 x 3.0 x 1.7 cm  LEFT: - No evidence of common femoral vein obstruction.  *  See table(s) above for measurements and observations. Electronically signed by Servando Snare MD on 08/31/2019 at 4:38:04 PM.    Final     Assessment & Plan:    Walker Kehr, MD

## 2019-10-24 DIAGNOSIS — R262 Difficulty in walking, not elsewhere classified: Secondary | ICD-10-CM | POA: Diagnosis not present

## 2019-10-24 DIAGNOSIS — M25561 Pain in right knee: Secondary | ICD-10-CM | POA: Diagnosis not present

## 2019-10-24 DIAGNOSIS — M25661 Stiffness of right knee, not elsewhere classified: Secondary | ICD-10-CM | POA: Diagnosis not present

## 2019-10-24 DIAGNOSIS — M1711 Unilateral primary osteoarthritis, right knee: Secondary | ICD-10-CM | POA: Diagnosis not present

## 2019-10-26 DIAGNOSIS — M25661 Stiffness of right knee, not elsewhere classified: Secondary | ICD-10-CM | POA: Diagnosis not present

## 2019-10-26 DIAGNOSIS — M25561 Pain in right knee: Secondary | ICD-10-CM | POA: Diagnosis not present

## 2019-10-26 DIAGNOSIS — R262 Difficulty in walking, not elsewhere classified: Secondary | ICD-10-CM | POA: Diagnosis not present

## 2019-10-26 DIAGNOSIS — M1711 Unilateral primary osteoarthritis, right knee: Secondary | ICD-10-CM | POA: Diagnosis not present

## 2019-10-31 DIAGNOSIS — R262 Difficulty in walking, not elsewhere classified: Secondary | ICD-10-CM | POA: Diagnosis not present

## 2019-10-31 DIAGNOSIS — M25661 Stiffness of right knee, not elsewhere classified: Secondary | ICD-10-CM | POA: Diagnosis not present

## 2019-10-31 DIAGNOSIS — M1711 Unilateral primary osteoarthritis, right knee: Secondary | ICD-10-CM | POA: Diagnosis not present

## 2019-10-31 NOTE — Assessment & Plan Note (Signed)
  On diet  

## 2019-10-31 NOTE — Assessment & Plan Note (Signed)
Vit D 

## 2019-11-02 DIAGNOSIS — M1711 Unilateral primary osteoarthritis, right knee: Secondary | ICD-10-CM | POA: Diagnosis not present

## 2019-11-02 DIAGNOSIS — R262 Difficulty in walking, not elsewhere classified: Secondary | ICD-10-CM | POA: Diagnosis not present

## 2019-11-02 DIAGNOSIS — M25561 Pain in right knee: Secondary | ICD-10-CM | POA: Diagnosis not present

## 2019-11-02 DIAGNOSIS — M25661 Stiffness of right knee, not elsewhere classified: Secondary | ICD-10-CM | POA: Diagnosis not present

## 2019-11-07 DIAGNOSIS — R262 Difficulty in walking, not elsewhere classified: Secondary | ICD-10-CM | POA: Diagnosis not present

## 2019-11-07 DIAGNOSIS — M1711 Unilateral primary osteoarthritis, right knee: Secondary | ICD-10-CM | POA: Diagnosis not present

## 2019-11-07 DIAGNOSIS — M25561 Pain in right knee: Secondary | ICD-10-CM | POA: Diagnosis not present

## 2019-11-07 DIAGNOSIS — M25661 Stiffness of right knee, not elsewhere classified: Secondary | ICD-10-CM | POA: Diagnosis not present

## 2019-11-09 DIAGNOSIS — R262 Difficulty in walking, not elsewhere classified: Secondary | ICD-10-CM | POA: Diagnosis not present

## 2019-11-09 DIAGNOSIS — M1711 Unilateral primary osteoarthritis, right knee: Secondary | ICD-10-CM | POA: Diagnosis not present

## 2019-11-09 DIAGNOSIS — M25661 Stiffness of right knee, not elsewhere classified: Secondary | ICD-10-CM | POA: Diagnosis not present

## 2019-11-09 DIAGNOSIS — M25561 Pain in right knee: Secondary | ICD-10-CM | POA: Diagnosis not present

## 2019-11-14 ENCOUNTER — Encounter: Payer: Self-pay | Admitting: Internal Medicine

## 2019-11-14 DIAGNOSIS — R262 Difficulty in walking, not elsewhere classified: Secondary | ICD-10-CM | POA: Diagnosis not present

## 2019-11-14 DIAGNOSIS — M25661 Stiffness of right knee, not elsewhere classified: Secondary | ICD-10-CM | POA: Diagnosis not present

## 2019-11-14 DIAGNOSIS — M25561 Pain in right knee: Secondary | ICD-10-CM | POA: Diagnosis not present

## 2019-11-14 DIAGNOSIS — M1711 Unilateral primary osteoarthritis, right knee: Secondary | ICD-10-CM | POA: Diagnosis not present

## 2019-11-16 DIAGNOSIS — M1711 Unilateral primary osteoarthritis, right knee: Secondary | ICD-10-CM | POA: Diagnosis not present

## 2019-11-16 DIAGNOSIS — M25561 Pain in right knee: Secondary | ICD-10-CM | POA: Diagnosis not present

## 2019-11-16 DIAGNOSIS — M25661 Stiffness of right knee, not elsewhere classified: Secondary | ICD-10-CM | POA: Diagnosis not present

## 2019-11-16 DIAGNOSIS — R262 Difficulty in walking, not elsewhere classified: Secondary | ICD-10-CM | POA: Diagnosis not present

## 2019-11-21 DIAGNOSIS — R262 Difficulty in walking, not elsewhere classified: Secondary | ICD-10-CM | POA: Diagnosis not present

## 2019-11-21 DIAGNOSIS — M25661 Stiffness of right knee, not elsewhere classified: Secondary | ICD-10-CM | POA: Diagnosis not present

## 2019-11-21 DIAGNOSIS — M25561 Pain in right knee: Secondary | ICD-10-CM | POA: Diagnosis not present

## 2019-11-21 DIAGNOSIS — M1711 Unilateral primary osteoarthritis, right knee: Secondary | ICD-10-CM | POA: Diagnosis not present

## 2019-11-27 ENCOUNTER — Ambulatory Visit (INDEPENDENT_AMBULATORY_CARE_PROVIDER_SITE_OTHER): Payer: Medicare Other | Admitting: Family Medicine

## 2019-11-27 ENCOUNTER — Encounter (INDEPENDENT_AMBULATORY_CARE_PROVIDER_SITE_OTHER): Payer: Self-pay | Admitting: Family Medicine

## 2019-11-27 ENCOUNTER — Other Ambulatory Visit: Payer: Self-pay

## 2019-11-27 VITALS — BP 127/69 | HR 59 | Temp 97.5°F | Ht 65.0 in | Wt 193.0 lb

## 2019-11-27 DIAGNOSIS — Z6832 Body mass index (BMI) 32.0-32.9, adult: Secondary | ICD-10-CM

## 2019-11-27 DIAGNOSIS — Z96651 Presence of right artificial knee joint: Secondary | ICD-10-CM

## 2019-11-27 DIAGNOSIS — R300 Dysuria: Secondary | ICD-10-CM

## 2019-11-27 DIAGNOSIS — E7849 Other hyperlipidemia: Secondary | ICD-10-CM

## 2019-11-27 DIAGNOSIS — M1711 Unilateral primary osteoarthritis, right knee: Secondary | ICD-10-CM | POA: Diagnosis not present

## 2019-11-27 DIAGNOSIS — E669 Obesity, unspecified: Secondary | ICD-10-CM | POA: Diagnosis not present

## 2019-11-27 DIAGNOSIS — R7303 Prediabetes: Secondary | ICD-10-CM

## 2019-11-27 DIAGNOSIS — I10 Essential (primary) hypertension: Secondary | ICD-10-CM

## 2019-11-27 DIAGNOSIS — E559 Vitamin D deficiency, unspecified: Secondary | ICD-10-CM

## 2019-11-27 MED ORDER — CEPHALEXIN 500 MG PO CAPS: 500.0000 mg | ORAL_CAPSULE | Freq: Three times a day (TID) | ORAL | 0 refills | Status: AC

## 2019-11-28 DIAGNOSIS — M1711 Unilateral primary osteoarthritis, right knee: Secondary | ICD-10-CM | POA: Diagnosis not present

## 2019-11-28 DIAGNOSIS — M25661 Stiffness of right knee, not elsewhere classified: Secondary | ICD-10-CM | POA: Diagnosis not present

## 2019-11-28 DIAGNOSIS — R262 Difficulty in walking, not elsewhere classified: Secondary | ICD-10-CM | POA: Diagnosis not present

## 2019-11-28 DIAGNOSIS — M25561 Pain in right knee: Secondary | ICD-10-CM | POA: Diagnosis not present

## 2019-11-29 LAB — URINALYSIS, ROUTINE W REFLEX MICROSCOPIC
Bilirubin, UA: NEGATIVE
Glucose, UA: NEGATIVE
Ketones, UA: NEGATIVE
Nitrite, UA: NEGATIVE
Protein,UA: NEGATIVE
Specific Gravity, UA: <=1.005 — AB (ref 1.005–1.030)
Urobilinogen, Ur: 0.2 mg/dL (ref 0.2–1.0)
pH, UA: 6.5 (ref 5.0–7.5)

## 2019-11-29 LAB — URINE CULTURE

## 2019-11-29 LAB — MICROSCOPIC EXAMINATION
Casts: NONE SEEN /lpf
Epithelial Cells (non renal): NONE SEEN /hpf (ref 0–10)
RBC, Urine: NONE SEEN /hpf (ref 0–2)
WBC, UA: >30 /hpf — AB (ref 0–5)

## 2019-11-29 NOTE — Progress Notes (Signed)
Chief Complaint:   OBESITY Bailey Bailey [name] is here to discuss her progress with her obesity treatment plan along with Bailey [name] of her obesity related diagnoses. Bailey Bailey [name] is on the Category 2 Plan and states she is following her eating plan approximately 0% of the time. Bailey Bailey [name] states she has increased her activity level.  Today's visit was #: 6 Starting weight: 196 lbs Starting date: 04/27/2019 Today's weight: 193 lbs Today's date: 11/27/2019 Total lbs lost to date: 3 lbs Total lbs lost since last in-office visit: 5 lbs Total weight loss percentage to date: -1.53%  Interim History: Bailey Bailey [name] says she has cloudy urine and dysuria that started today. She is status post right TKA. She is getting strength back.   Assessment/Plan:   1. Vitamin D deficiency Current vitamin D is 68.96, tested on 07/10/2019. Not at goal. Optimal goal > 50 ng/dL. There is also evidence to support a goal of >70 ng/dL in patients with cancer and heart disease. Plan: Continue Vitamin D.  2. Essential hypertension At goal. Medications: amlodipine-olmesartan. We will monitor for hypotension with continued weight loss.   BP Readings from Last 3 Encounters:  11/27/19 127/69  10/23/19 128/70  08/15/19 (!) 156/76   Lab Results  Component Value Date   CREATININE 0.66 08/15/2019   3. Other hyperlipidemia Cardiovascular risk and specific lipid/LDL goals reviewed.  She will continue to focus on protein-rich, low simple carbohydrate foods. We reviewed the importance of hydration, regular exercise for stress reduction, and restorative sleep.   Lab Results  Component Value Date   ALT 22 08/07/2019   AST 21 08/07/2019   ALKPHOS 60 08/07/2019   BILITOT 1.2 08/07/2019   Lab Results  Component Value Date   CHOL 220 (H) 07/10/2019   HDL 53.70 07/10/2019   LDLCALC 84 04/04/2019   LDLDIRECT 132.0 07/10/2019   TRIG 202.0 (H) 07/10/2019   CHOLHDL 4 07/10/2019   4. Status post total right knee replacement On 08/14/2019. Will  continue to monitor symptoms as they relate to her weight loss journey. This issue directly impacts care plan for optimization of BMI and metabolic health as it impacts the patient's ability to make lifestyle changes.  5. Prediabetes Goal is HgbA1c < 5.7 and insulin level closer to 5. We will continue to monitor.  Lab Results  Component Value Date   HGBA1C 5.8 07/10/2019   6. Dysuria Will check labs today.  - Urinalysis, Routine w reflex microscopic - Urine Culture - cephALEXin (KEFLEX) 500 MG capsule; Take 1 capsule (500 mg total) by mouth 3 (three) times daily for 5 days.  Dispense: 15 capsule; Refill: 0  7. Class 1 obesity with serious comorbidity and body mass index (BMI) of 32.0 to 32.9 in adult, unspecified obesity type  Bailey Bailey [name] is currently in the action stage of change. As such, her goal is to continue with weight loss efforts. She has Bailey [name] to the Category 2 Plan and practicing portion control and making smarter food choices, such as increasing vegetables and decreasing simple carbohydrates.   Exercise goals: PT.  Behavioral modification strategies: increasing water intake.  Bailey Bailey [name] with our clinic in 4 weeks. She was Bailey [name] of the importance of frequent Bailey [name] visits to maximize her success with intensive lifestyle modifications for her multiple health conditions.   Bailey Bailey [name] we would discuss her lab results at her next visit unless there is a critical issue that needs to be addressed sooner. Bailey Bailey [name] to keep her next visit at the  Bailey [name] upon time to discuss these results.  Objective:   Blood pressure 127/69, pulse (!) 59, temperature (!) 97.5 F (36.4 C), temperature source Oral, height 5\' 5"  (1.651 m), weight 193 lb (87.5 kg), SpO2 97 %. Body mass index is 32.12 kg/m.  General: Cooperative, alert, well developed, in no acute distress. HEENT: Conjunctivae and lids unremarkable. Cardiovascular: Regular rhythm.  Lungs: Normal  work of breathing. Neurologic: No focal deficits.   Lab Results  Component Value Date   CREATININE 0.66 08/15/2019   BUN 12 08/15/2019   NA 138 08/15/2019   K 4.7 08/15/2019   CL 107 08/15/2019   CO2 25 08/15/2019   Lab Results  Component Value Date   ALT 22 08/07/2019   AST 21 08/07/2019   ALKPHOS 60 08/07/2019   BILITOT 1.2 08/07/2019   Lab Results  Component Value Date   HGBA1C 5.8 07/10/2019   HGBA1C 5.9 (H) 02/13/2019   HGBA1C 5.8 (H) 08/02/2018   HGBA1C 5.4 12/17/2016   Lab Results  Component Value Date   TSH 2.500 08/02/2018   Lab Results  Component Value Date   CHOL 220 (H) 07/10/2019   HDL 53.70 07/10/2019   LDLCALC 84 04/04/2019   LDLDIRECT 132.0 07/10/2019   TRIG 202.0 (H) 07/10/2019   CHOLHDL 4 07/10/2019   Lab Results  Component Value Date   WBC 12.2 (H) 08/15/2019   HGB 12.3 08/15/2019   HCT 35.9 (L) 08/15/2019   MCV 95.5 08/15/2019   PLT 199 08/15/2019   Obesity Behavioral Intervention:   Approximately 15 minutes were spent on the discussion below.  ASK: We discussed the diagnosis of obesity with Bailey Bailey [name] today and Bailey Bailey [name] to give [place] permission to discuss obesity behavioral modification therapy today.  ASSESS: Bailey Bailey [name] has the diagnosis of obesity and her BMI today is 32.2. Bailey Bailey [name] is in the action stage of change.   ADVISE: Bailey Bailey [name] was educated on the multiple health risks of obesity as well as the benefit of weight loss to improve her health. She was advised of the need for long term treatment and the importance of lifestyle modifications to improve her current health and to decrease her risk of future health problems.  AGREE: Multiple dietary modification options and treatment options were discussed and Bailey Bailey [name] to follow the recommendations documented in the above note.  ARRANGE: Bailey Bailey [name] was educated on the importance of frequent visits to treat obesity as outlined per CMS and USPSTF guidelines and Bailey [name] to schedule her next  follow up appointment today.  Attestation Statements:   Reviewed by clinician on day of visit: allergies, medications, problem list, medical history, surgical history, family history, social history, and previous encounter notes.  I, Water quality scientist, CMA, am acting as transcriptionist for Briscoe Deutscher, DO  I have reviewed the above documentation for accuracy and completeness, and I agree with the above. Briscoe Deutscher, DO

## 2019-11-30 DIAGNOSIS — M1711 Unilateral primary osteoarthritis, right knee: Secondary | ICD-10-CM | POA: Diagnosis not present

## 2019-11-30 DIAGNOSIS — M25661 Stiffness of right knee, not elsewhere classified: Secondary | ICD-10-CM | POA: Diagnosis not present

## 2019-11-30 DIAGNOSIS — M25561 Pain in right knee: Secondary | ICD-10-CM | POA: Diagnosis not present

## 2019-11-30 DIAGNOSIS — R262 Difficulty in walking, not elsewhere classified: Secondary | ICD-10-CM | POA: Diagnosis not present

## 2019-12-05 DIAGNOSIS — R262 Difficulty in walking, not elsewhere classified: Secondary | ICD-10-CM | POA: Diagnosis not present

## 2019-12-05 DIAGNOSIS — M25661 Stiffness of right knee, not elsewhere classified: Secondary | ICD-10-CM | POA: Diagnosis not present

## 2019-12-05 DIAGNOSIS — M25561 Pain in right knee: Secondary | ICD-10-CM | POA: Diagnosis not present

## 2019-12-05 DIAGNOSIS — M1711 Unilateral primary osteoarthritis, right knee: Secondary | ICD-10-CM | POA: Diagnosis not present

## 2019-12-13 DIAGNOSIS — M1711 Unilateral primary osteoarthritis, right knee: Secondary | ICD-10-CM | POA: Diagnosis not present

## 2019-12-13 DIAGNOSIS — M25561 Pain in right knee: Secondary | ICD-10-CM | POA: Diagnosis not present

## 2019-12-13 DIAGNOSIS — M25661 Stiffness of right knee, not elsewhere classified: Secondary | ICD-10-CM | POA: Diagnosis not present

## 2019-12-13 DIAGNOSIS — R262 Difficulty in walking, not elsewhere classified: Secondary | ICD-10-CM | POA: Diagnosis not present

## 2019-12-19 DIAGNOSIS — M25661 Stiffness of right knee, not elsewhere classified: Secondary | ICD-10-CM | POA: Diagnosis not present

## 2019-12-19 DIAGNOSIS — M1711 Unilateral primary osteoarthritis, right knee: Secondary | ICD-10-CM | POA: Diagnosis not present

## 2019-12-19 DIAGNOSIS — M25251 Flail joint, right hip: Secondary | ICD-10-CM | POA: Diagnosis not present

## 2019-12-19 DIAGNOSIS — R262 Difficulty in walking, not elsewhere classified: Secondary | ICD-10-CM | POA: Diagnosis not present

## 2019-12-21 ENCOUNTER — Other Ambulatory Visit: Payer: Self-pay | Admitting: Internal Medicine

## 2019-12-21 DIAGNOSIS — E785 Hyperlipidemia, unspecified: Secondary | ICD-10-CM

## 2019-12-26 ENCOUNTER — Ambulatory Visit (INDEPENDENT_AMBULATORY_CARE_PROVIDER_SITE_OTHER): Payer: Medicare Other | Admitting: Family Medicine

## 2019-12-28 DIAGNOSIS — M25561 Pain in right knee: Secondary | ICD-10-CM | POA: Diagnosis not present

## 2019-12-28 DIAGNOSIS — M1711 Unilateral primary osteoarthritis, right knee: Secondary | ICD-10-CM | POA: Diagnosis not present

## 2019-12-28 DIAGNOSIS — R262 Difficulty in walking, not elsewhere classified: Secondary | ICD-10-CM | POA: Diagnosis not present

## 2019-12-28 DIAGNOSIS — M25661 Stiffness of right knee, not elsewhere classified: Secondary | ICD-10-CM | POA: Diagnosis not present

## 2020-01-01 DIAGNOSIS — M1711 Unilateral primary osteoarthritis, right knee: Secondary | ICD-10-CM | POA: Diagnosis not present

## 2020-01-09 ENCOUNTER — Ambulatory Visit (INDEPENDENT_AMBULATORY_CARE_PROVIDER_SITE_OTHER): Payer: Medicare Other | Admitting: Family Medicine

## 2020-01-09 ENCOUNTER — Encounter (INDEPENDENT_AMBULATORY_CARE_PROVIDER_SITE_OTHER): Payer: Self-pay | Admitting: Family Medicine

## 2020-01-09 ENCOUNTER — Other Ambulatory Visit: Payer: Self-pay

## 2020-01-09 VITALS — BP 149/74 | HR 57 | Temp 97.9°F | Ht 65.0 in | Wt 196.0 lb

## 2020-01-09 DIAGNOSIS — R600 Localized edema: Secondary | ICD-10-CM | POA: Diagnosis not present

## 2020-01-09 DIAGNOSIS — R7303 Prediabetes: Secondary | ICD-10-CM

## 2020-01-09 DIAGNOSIS — K5909 Other constipation: Secondary | ICD-10-CM

## 2020-01-09 DIAGNOSIS — Z6832 Body mass index (BMI) 32.0-32.9, adult: Secondary | ICD-10-CM

## 2020-01-09 DIAGNOSIS — E669 Obesity, unspecified: Secondary | ICD-10-CM

## 2020-01-09 DIAGNOSIS — E66811 Obesity, class 1: Secondary | ICD-10-CM

## 2020-01-09 DIAGNOSIS — Z96651 Presence of right artificial knee joint: Secondary | ICD-10-CM

## 2020-01-11 NOTE — Progress Notes (Signed)
Chief Complaint:   OBESITY Bailey [name] is here to discuss her progress with her obesity treatment plan along with follow-up of her obesity related diagnoses.   Today's visit was #: 7 Starting weight: 196 lbs Starting date: 04/27/2019 Today's weight: 196 lbs Today's date: 01/09/2020 Total lbs lost to date: 0 Body mass index is 32.62 kg/m.   Interim History: Bailey [name] says she has been bloated and constipated for 4 days.  She has also had some increased ankle edema.  Nutrition Plan: the Category 2 Plan.  Activity: PT 1-3 times per week.  Assessment/Plan:   1. Other constipation Counseling Getting to Good Bowel Health: Your goal is to have one soft bowel movement each day. Drink at least 8 glasses of water each day. Eat plenty of fiber (goal is over 25 grams each day). It is best to get most of your fiber from dietary sources which includes leafy green vegetables, fresh fruit, and whole grains. You may need to add fiber with the help of OTC fiber supplements. These include Metamucil, Citrucel, and Benefiber. If you are still having trouble, try adding Miralax or Magnesium Citrate. If all of these changes do not work, contact me.  2. Bilateral lower extremity edema Discussed elevation, increased water intake, and more movement.  She will also decrease her salt intake and add compression. Red flags reviewed. Note: Discussed medication options since she was previously taking a diuretic, but she would like to wait.  3. Status post total right knee replacement Bailey [name] is going to PT once a week for pain in her knee. We will continue to monitor symptoms as they relate to her weight loss journey. This issue directly impacts care plan for optimization of BMI and metabolic health as it impacts the patient's ability to make lifestyle changes.  4. Prediabetes Not optimized. Goal is HgbA1c < 5.7.  She will continue to focus on protein-rich, low simple carbohydrate foods. We reviewed the importance of  hydration, regular exercise for stress reduction, and restorative sleep.   Lab Results  Component Value Date   HGBA1C 5.8 07/10/2019   5. Class 1 obesity with serious comorbidity and body mass index (BMI) of 32.0 to 32.9 in adult, unspecified obesity type  Course: Bailey [name] is currently in the action stage of change. As such, her goal is to continue with weight loss efforts.   Nutrition goals: She has agreed to the Category 2 Plan.   Exercise goals: Older adults should follow the adult guidelines. When older adults cannot meet the adult guidelines, they should be as physically active as their abilities and conditions will allow.  Older adults should do exercises that maintain or improve balance if they are at risk of falling.   Behavioral modification strategies: increasing lean protein intake, decreasing simple carbohydrates, increasing vegetables and increasing water intake.  Bailey [name] has agreed to follow-up with our clinic in 3 weeks. She was informed of the importance of frequent follow-up visits to maximize her success with intensive lifestyle modifications for her multiple health conditions.   Objective:   Blood pressure (!) 149/74, pulse (!) 57, temperature 97.9 F (36.6 C), temperature source Oral, height 5\' 5"  (1.651 m), weight 196 lb (88.9 kg), SpO2 95 %. Body mass index is 32.62 kg/m.  General: Cooperative, alert, well developed, in no acute distress. HEENT: Conjunctivae and lids unremarkable. Cardiovascular: Regular rhythm.  Lungs: Normal work of breathing. Neurologic: No focal deficits.   Lab Results  Component Value Date   CREATININE 0.66 08/15/2019  BUN 12 08/15/2019   NA 138 08/15/2019   K 4.7 08/15/2019   CL 107 08/15/2019   CO2 25 08/15/2019   Lab Results  Component Value Date   ALT 22 08/07/2019   AST 21 08/07/2019   ALKPHOS 60 08/07/2019   BILITOT 1.2 08/07/2019   Lab Results  Component Value Date   HGBA1C 5.8 07/10/2019   HGBA1C 5.9 (H) 02/13/2019    HGBA1C 5.8 (H) 08/02/2018   HGBA1C 5.4 12/17/2016   Lab Results  Component Value Date   TSH 2.500 08/02/2018   Lab Results  Component Value Date   CHOL 220 (H) 07/10/2019   HDL 53.70 07/10/2019   LDLCALC 84 04/04/2019   LDLDIRECT 132.0 07/10/2019   TRIG 202.0 (H) 07/10/2019   CHOLHDL 4 07/10/2019   Lab Results  Component Value Date   WBC 12.2 (H) 08/15/2019   HGB 12.3 08/15/2019   HCT 35.9 (L) 08/15/2019   MCV 95.5 08/15/2019   PLT 199 08/15/2019   Obesity Behavioral Intervention:   Approximately 15 minutes were spent on the discussion below.  ASK: We discussed the diagnosis of obesity with Bailey [name] today and Bailey [name] agreed to give [place] permission to discuss obesity behavioral modification therapy today.  ASSESS: Bailey [name] has the diagnosis of obesity and her BMI today is 32.6. Bailey [name] is in the action stage of change.   ADVISE: Bailey [name] was educated on the multiple health risks of obesity as well as the benefit of weight loss to improve her health. She was advised of the need for long term treatment and the importance of lifestyle modifications to improve her current health and to decrease her risk of future health problems.  AGREE: Multiple dietary modification options and treatment options were discussed and Bailey [name] agreed to follow the recommendations documented in the above note.  ARRANGE: Bailey [name] was educated on the importance of frequent visits to treat obesity as outlined per CMS and USPSTF guidelines and agreed to schedule her next follow up appointment today.  Attestation Statements:   Reviewed by clinician on day of visit: allergies, medications, problem list, medical history, surgical history, family history, social history, and previous encounter notes.  I, Water quality scientist, CMA, am acting as transcriptionist for Briscoe Deutscher, DO  I have reviewed the above documentation for accuracy and completeness, and I agree with the above. Briscoe Deutscher, DO

## 2020-01-18 ENCOUNTER — Ambulatory Visit (INDEPENDENT_AMBULATORY_CARE_PROVIDER_SITE_OTHER): Payer: Medicare Other | Admitting: Adult Health

## 2020-02-01 ENCOUNTER — Ambulatory Visit (INDEPENDENT_AMBULATORY_CARE_PROVIDER_SITE_OTHER): Payer: Medicare Other | Admitting: Family Medicine

## 2020-03-11 ENCOUNTER — Ambulatory Visit (INDEPENDENT_AMBULATORY_CARE_PROVIDER_SITE_OTHER): Payer: Medicare Other | Admitting: Family Medicine

## 2020-03-29 ENCOUNTER — Encounter: Payer: Self-pay | Admitting: Family

## 2020-03-29 ENCOUNTER — Other Ambulatory Visit: Payer: Self-pay

## 2020-03-29 ENCOUNTER — Ambulatory Visit (INDEPENDENT_AMBULATORY_CARE_PROVIDER_SITE_OTHER): Payer: Medicare Other | Admitting: Family

## 2020-03-29 VITALS — BP 128/82 | HR 57 | Temp 97.7°F | Wt 197.6 lb

## 2020-03-29 DIAGNOSIS — Z87440 Personal history of urinary (tract) infections: Secondary | ICD-10-CM

## 2020-03-29 DIAGNOSIS — R5383 Other fatigue: Secondary | ICD-10-CM

## 2020-03-29 LAB — CBC WITH DIFFERENTIAL/PLATELET
Basophils Absolute: 0 10*3/uL (ref 0.0–0.1)
Basophils Relative: 0.6 % (ref 0.0–3.0)
Eosinophils Absolute: 0.1 10*3/uL (ref 0.0–0.7)
Eosinophils Relative: 0.9 % (ref 0.0–5.0)
HCT: 45.2 % (ref 36.0–46.0)
Hemoglobin: 15.6 g/dL — ABNORMAL HIGH (ref 12.0–15.0)
Lymphocytes Relative: 28.5 % (ref 12.0–46.0)
Lymphs Abs: 2.1 10*3/uL (ref 0.7–4.0)
MCHC: 34.4 g/dL (ref 30.0–36.0)
MCV: 91.7 fl (ref 78.0–100.0)
Monocytes Absolute: 0.6 10*3/uL (ref 0.1–1.0)
Monocytes Relative: 8.2 % (ref 3.0–12.0)
Neutro Abs: 4.6 10*3/uL (ref 1.4–7.7)
Neutrophils Relative %: 61.8 % (ref 43.0–77.0)
Platelets: 242 10*3/uL (ref 150.0–400.0)
RBC: 4.93 Mil/uL (ref 3.87–5.11)
RDW: 13.3 % (ref 11.5–15.5)
WBC: 7.4 10*3/uL (ref 4.0–10.5)

## 2020-03-29 LAB — COMPREHENSIVE METABOLIC PANEL
ALT: 15 U/L (ref 0–35)
AST: 16 U/L (ref 0–37)
Albumin: 4.6 g/dL (ref 3.5–5.2)
Alkaline Phosphatase: 107 U/L (ref 39–117)
BUN: 14 mg/dL (ref 6–23)
CO2: 30 mEq/L (ref 19–32)
Calcium: 10.8 mg/dL — ABNORMAL HIGH (ref 8.4–10.5)
Chloride: 102 mEq/L (ref 96–112)
Creatinine, Ser: 0.76 mg/dL (ref 0.40–1.20)
GFR: 79.83 mL/min (ref >60.00)
Glucose, Bld: 108 mg/dL — ABNORMAL HIGH (ref 70–99)
Potassium: 4 mEq/L (ref 3.5–5.1)
Sodium: 138 mEq/L (ref 135–145)
Total Bilirubin: 1 mg/dL (ref 0.2–1.2)
Total Protein: 8 g/dL (ref 6.0–8.3)

## 2020-03-29 LAB — TSH: TSH: 2.01 u[IU]/mL (ref 0.35–4.50)

## 2020-03-29 LAB — VITAMIN B12: Vitamin B-12: 265 pg/mL (ref 211–911)

## 2020-03-29 NOTE — Progress Notes (Signed)
Bailey [name] is a 70 y.o. female with the following history as recorded in EpicCare:  Patient Active Problem List   Diagnosis Date Noted  . Preop exam for internal medicine 07/18/2019  . Increased urinary frequency 04/06/2019  . Statin declined 02/15/2019  . Nocturia more than twice per night 02/15/2019  . Statin intolerance-   muscle cramps 02/15/2019  . Prediabetes 08/17/2018  . Incontinence in female 07/27/2018  . Nocturia-chronic 07/27/2018  . Actinic keratoses 09/30/2017  . h/o Intolerance of drug- statin 07/14/2017  . 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 Inactivity 07/14/2017  . Dysuria 06/23/2017  . Abnormal urinalysis 06/23/2017  . Chronic pain of right knee 12/21/2016  . Abnormality of heart beat-  08/17/2016  . Acute reaction to situational stress 08/17/2016  . GAD (generalized anxiety disorder) 08/17/2016  . Hematuria 03/13/2016  . Pelvic pressure in female 03/13/2016  . Hx of cardiovascular stress test 12/22/2015  . Vitamin D deficiency 12/22/2015  . Generalized OA 10/29/2014  . Primary localized osteoarthritis of right knee 10/29/2014  . Chronic Fatigue 07/27/2014  . Chronic constipation- sees GI 07/27/2014  . Rectus diastasis 08/09/2013  . Obesity (BMI 30-39.9) 06/22/2013  . Obesity 06/22/2013  . Gallstone 07/27/2012  . Acute diverticulitis 07/27/2012  . Partial tear of subscapularis tendon 04/22/2012  . Supraspinatus tendon tear 04/22/2012  . Trochanteric bursitis of right hip   . Asymptomatic postmenopausal status 01/04/2009  . Dyslipidemia 09/27/2008  . Hyperlipidemia 09/27/2008  . Essential hypertension 06/28/2008    Current Outpatient Medications  Medication Sig Dispense Refill  . amLODipine-olmesartan (AZOR) 5-20 MG tablet Take 1 tablet by mouth daily.     No current facility-administered medications for this visit.    Allergies: Codeine, Hctz [hydrochlorothiazide], and Statins  Past Medical History:  Diagnosis  Date  . Arthritis   . Arthritis of right knee   . Borderline diabetes   . Burning sensation of feet   . Colitis    hx colitis-gi bleed-2006  . Constipation   . DYSLIPIDEMIA   . Gallbladder problem   . Headache    hx of 10 years ago  . Hx of cardiovascular stress test    Lexiscan Myoview 9/14:  Small, fixed apical anterior perfusion defect (worse at rest) - probable soft tissue attenuation, EF 61%, low risk study  . HYPERTENSION   . Lower extremity edema   . Partial tear of subscapularis tendon 04/22/2012  . POSTMENOPAUSAL STATUS   . Pre-diabetes   . Supraspinatus tendon tear 04/22/2012  . Tubular adenoma of colon 08/2013  . Vitamin D deficiency     Past Surgical History:  Procedure Laterality Date  . ABDOMINAL HYSTERECTOMY  1987   Partial  . APPENDECTOMY  2003  . CERVICAL FUSION  2002  . COLONOSCOPY  06/03/04   isch colitis (hosp for same)  . FRACTURE SURGERY     leg ? right  70 years old  . HERNIA REPAIR  1975   umb  . SHOULDER ARTHROSCOPY WITH ROTATOR CUFF REPAIR AND SUBACROMIAL DECOMPRESSION Right 04/22/2012   Procedure: SHOULDER ARTHROSCOPY WITH ROTATOR CUFF REPAIR AND SUBACROMIAL DECOMPRESSION;  Surgeon: Johnny Bridge, MD;  Location: [place];  Service: Orthopedics;  Laterality: Right;  RIGHT SHOULDER ARTHROSCOPY DEBRIDEMENT LIMITED, DECOMPRESSION SUBACROMIAL PARTIAL ACROMIOPLASTY WITH CORACOACROMIAL RELEASE, WITH ROTATOR CUFF REPAIR AND SUBSCAPULARIS REPAIR  . TOTAL KNEE ARTHROPLASTY Right 08/14/2019   Procedure: TOTAL KNEE ARTHROPLASTY;  Surgeon: Elsie Saas, MD;  Location: [facility];  Service: Orthopedics;  Laterality: Right;  . TUBAL LIGATION      Family History  Problem Relation Age of Onset  . Hypertension Father   . Stroke Father   . Heart disease Father   . Aneurysm Mother        brain  . Stroke Mother   . Stroke Sister   . Hypertension Sister   . Diabetes Brother   . Colon cancer Neg Hx   . Esophageal cancer Neg Hx   . Rectal cancer Neg  Hx   . Stomach cancer Neg Hx     Social History   Tobacco Use  . Smoking status: Never Smoker  . Smokeless tobacco: Never Used  . Tobacco comment: exposed to second hand (spouse smokes), Married  Substance Use Topics  . Alcohol use: No    Subjective:  Patient is complaining of feeling more tired than normal in the past week; denies any respiratory symptoms;  Very vague symptoms- "just feels sudden onset of fatigue" and has to rest; no specific urinary symptoms; no chest pain, no shortness of breath, no dizziness; does not feel any changes with her heart beating; pulse routinely averages 57-60; Took home COVID test- negative on Monday;       Objective:  Vitals:   03/29/20 1135  BP: 128/82  Pulse: (!) 57  Temp: 97.7 F (36.5 C)  TempSrc: Oral  SpO2: 96%  Weight: 197 lb 9.6 oz (89.6 kg)    General: Well developed, well nourished, in no acute distress  Skin : Warm and dry.  Head: Normocephalic and atraumatic  Eyes: Sclera and conjunctiva clear; pupils round and reactive to light; extraocular movements intact  Ears: External normal; canals clear; tympanic membranes normal  Oropharynx: Pink, supple. No suspicious lesions  Neck: Supple without thyromegaly, adenopathy  Lungs: Respirations unlabored; clear to auscultation bilaterally without wheeze, rales, rhonchi  [facility] exam: normal rate and regular rhythm.  Neurologic: Alert and oriented; speech intact; face symmetrical; moves all extremities well; CNII-XII intact without focal deficit   Assessment:  1. Other fatigue   2. History of UTI     Plan:  ? Etiology; update labs and urine culture today; encouraged rest, fluids; follow up to be determined;  This visit occurred during the SARS-CoV-2 public health emergency.  Safety protocols were in place, including screening questions prior to the visit, additional usage of staff PPE, and extensive cleaning of exam room while observing appropriate contact time as indicated for  disinfecting solutions.     No follow-ups on file.  Orders Placed This Encounter  Procedures  . Urine Culture    Standing Status:   Future    Number of Occurrences:   1    Standing Expiration Date:   03/29/2021  . CBC with Differential/Platelet    Standing Status:   Future    Number of Occurrences:   1    Standing Expiration Date:   03/29/2021  . Comp Met (CMET)    Standing Status:   Future    Number of Occurrences:   1    Standing Expiration Date:   03/29/2021  . TSH    Standing Status:   Future    Number of Occurrences:   1    Standing Expiration Date:   03/29/2021  . B12    Standing Status:   Future    Number of Occurrences:   1    Standing Expiration Date:   03/29/2021    Requested Prescriptions  No prescriptions requested or ordered in this encounter

## 2020-03-30 LAB — URINE CULTURE: Result:: NO GROWTH

## 2020-03-31 ENCOUNTER — Other Ambulatory Visit: Payer: Self-pay | Admitting: Internal Medicine

## 2020-03-31 DIAGNOSIS — E559 Vitamin D deficiency, unspecified: Secondary | ICD-10-CM

## 2020-04-01 ENCOUNTER — Other Ambulatory Visit: Payer: Self-pay | Admitting: Internal Medicine

## 2020-04-01 ENCOUNTER — Telehealth: Payer: Self-pay | Admitting: Internal Medicine

## 2020-04-01 DIAGNOSIS — E559 Vitamin D deficiency, unspecified: Secondary | ICD-10-CM

## 2020-04-01 NOTE — Telephone Encounter (Signed)
    Patient calling to discuss Vitamin D and B12 She is unsure what to purchase from pharmacy  Please call

## 2020-04-02 ENCOUNTER — Encounter: Payer: Self-pay | Admitting: Family

## 2020-04-02 MED ORDER — CYANOCOBALAMIN 500 MCG PO TABS
500.0000 ug | ORAL_TABLET | Freq: Every day | ORAL | 3 refills | Status: AC
Start: 2020-04-02 — End: ?

## 2020-04-02 MED ORDER — VITAMIN D3 50 MCG (2000 UT) PO CAPS
2000.0000 [IU] | ORAL_CAPSULE | Freq: Every day | ORAL | 3 refills | Status: AC
Start: 2020-04-02 — End: ?

## 2020-04-02 NOTE — Telephone Encounter (Signed)
Okay.  Vitamin B12 and vitamin D [facility].  Thanks

## 2020-04-03 ENCOUNTER — Ambulatory Visit (INDEPENDENT_AMBULATORY_CARE_PROVIDER_SITE_OTHER): Payer: Medicare Other

## 2020-04-03 ENCOUNTER — Other Ambulatory Visit: Payer: Self-pay

## 2020-04-03 DIAGNOSIS — E538 Deficiency of other specified B group vitamins: Secondary | ICD-10-CM

## 2020-04-03 MED ORDER — CYANOCOBALAMIN 1000 MCG/ML IJ SOLN
1000.0000 ug | INTRAMUSCULAR | Status: AC
  Administered 2020-04-03 – 2020-04-24 (×4): 1000 ug via INTRAMUSCULAR

## 2020-04-03 NOTE — Progress Notes (Addendum)
Pt here for weekly B12 injection #1 of 4 per Jodi Mourning, FNP  B12 101mcg given IM left deltoid and pt tolerated injection well.  Next B12 injection scheduled for 04/10/20.   Medical screening examination/treatment/procedure(s) were performed by non-physician practitioner and as supervising physician I was immediately available for consultation/collaboration.  I agree with above. Lew Dawes, MD

## 2020-04-03 NOTE — Telephone Encounter (Signed)
LVM instructing pt to call office to schedule nurse visit appts for b12 injections weekly x 4.  Will also send mychart message.

## 2020-04-09 ENCOUNTER — Other Ambulatory Visit: Payer: Self-pay

## 2020-04-10 ENCOUNTER — Ambulatory Visit (INDEPENDENT_AMBULATORY_CARE_PROVIDER_SITE_OTHER): Payer: Medicare Other

## 2020-04-10 DIAGNOSIS — E538 Deficiency of other specified B group vitamins: Secondary | ICD-10-CM | POA: Diagnosis not present

## 2020-04-10 NOTE — Progress Notes (Addendum)
Pt here for weekly B12 injection # 2 of 4 per Jodi Mourning FNP  B12 1050mcg given IM right deltoid and pt tolerated injection well.  Next B12 injection scheduled for 04/17/20.  Medical screening examination/treatment/procedure(s) were performed by non-physician practitioner and as supervising physician I was immediately available for consultation/collaboration.  I agree with above. Lew Dawes, MD

## 2020-04-16 ENCOUNTER — Other Ambulatory Visit: Payer: Self-pay

## 2020-04-17 ENCOUNTER — Ambulatory Visit (INDEPENDENT_AMBULATORY_CARE_PROVIDER_SITE_OTHER): Payer: Medicare Other

## 2020-04-17 DIAGNOSIS — E538 Deficiency of other specified B group vitamins: Secondary | ICD-10-CM

## 2020-04-17 MED ORDER — CYANOCOBALAMIN 1000 MCG/ML IJ SOLN: 1000.0000 ug | Freq: Once | INTRAMUSCULAR | Status: DC

## 2020-04-17 NOTE — Progress Notes (Addendum)
B12 given Please cosign.   Medical screening examination/treatment/procedure(s) were performed by non-physician practitioner and as supervising physician I was immediately available for consultation/collaboration.  I agree with above. Aleksei Plotnikov, MD  

## 2020-04-24 ENCOUNTER — Other Ambulatory Visit: Payer: Self-pay

## 2020-04-24 ENCOUNTER — Ambulatory Visit (INDEPENDENT_AMBULATORY_CARE_PROVIDER_SITE_OTHER): Payer: Medicare Other

## 2020-04-24 ENCOUNTER — Ambulatory Visit: Payer: Medicare Other | Admitting: Internal Medicine

## 2020-04-24 DIAGNOSIS — E538 Deficiency of other specified B group vitamins: Secondary | ICD-10-CM

## 2020-04-24 NOTE — Progress Notes (Addendum)
Pt here for weekly B12 injection #4 of 4 per Mindi Slicker FNP.  B12 1045mcg given IM right deltoid and pt tolerated injection well.  Pt scheduled for OV with PCP on 05/02/20.   Medical screening examination/treatment/procedure(s) were performed by non-physician practitioner and as supervising physician I was immediately available for consultation/collaboration.  I agree with above. Lew Dawes, MD

## 2020-05-02 ENCOUNTER — Ambulatory Visit (INDEPENDENT_AMBULATORY_CARE_PROVIDER_SITE_OTHER): Payer: Medicare Other | Admitting: Internal Medicine

## 2020-05-02 ENCOUNTER — Ambulatory Visit (INDEPENDENT_AMBULATORY_CARE_PROVIDER_SITE_OTHER): Payer: Medicare Other

## 2020-05-02 ENCOUNTER — Other Ambulatory Visit: Payer: Self-pay

## 2020-05-02 ENCOUNTER — Encounter: Payer: Self-pay | Admitting: Internal Medicine

## 2020-05-02 VITALS — BP 118/80 | HR 78 | Temp 97.9°F | Ht 65.0 in | Wt 192.0 lb

## 2020-05-02 VITALS — BP 118/80 | HR 78 | Temp 97.9°F | Resp 16 | Ht 65.0 in | Wt 192.0 lb

## 2020-05-02 DIAGNOSIS — L661 Lichen planopilaris: Secondary | ICD-10-CM | POA: Diagnosis not present

## 2020-05-02 DIAGNOSIS — I10 Essential (primary) hypertension: Secondary | ICD-10-CM | POA: Diagnosis not present

## 2020-05-02 DIAGNOSIS — Z0000 Encounter for general adult medical examination without abnormal findings: Secondary | ICD-10-CM | POA: Diagnosis not present

## 2020-05-02 DIAGNOSIS — E559 Vitamin D deficiency, unspecified: Secondary | ICD-10-CM

## 2020-05-02 DIAGNOSIS — E7849 Other hyperlipidemia: Secondary | ICD-10-CM

## 2020-05-02 DIAGNOSIS — L570 Actinic keratosis: Secondary | ICD-10-CM | POA: Diagnosis not present

## 2020-05-02 DIAGNOSIS — E538 Deficiency of other specified B group vitamins: Secondary | ICD-10-CM | POA: Diagnosis not present

## 2020-05-02 NOTE — Progress Notes (Addendum)
Subjective:   Bailey [name] is a 70 y.o. female who presents for Medicare Annual (Subsequent) preventive examination.  Review of Systems    No ROS. Medicare Wellness Visit. Additional risk factors are reflected in social history. Cardiac Risk Factors include: advanced age (>56men, >78 women);family history of premature cardiovascular disease;hypertension;dyslipidemia     Objective:    Today's Vitals   05/02/20 1324  BP: 118/80  Pulse: 78  Resp: 16  Temp: 97.9 F (36.6 C)  SpO2: 97%  Weight: 192 lb (87.1 kg)  Height: 5\' 5"  (1.651 m)  PainSc: 0-No pain   Body mass index is 31.95 kg/m.  Advanced Directives 05/02/2020 08/14/2019 08/07/2019 08/17/2016 04/19/2012  Does Patient Have a Medical Advance Directive? Yes Yes Yes Yes Patient does not have advance directive  Type of Advance Directive Living will;Healthcare Power of [place];Living will [place];Living will [place];Living will -  Does patient want to make changes to medical advance directive? No - Patient declined No - Patient declined - - -  Copy of [place] in Chart? No - copy requested - No - copy requested No - copy requested -    Current Medications (verified) Outpatient Encounter Medications as of 05/02/2020  Medication Sig   amLODipine-olmesartan (AZOR) 5-20 MG tablet Take 1 tablet by mouth daily.   Cholecalciferol (VITAMIN D3) 50 MCG (2000 UT) capsule Take 1 capsule (2,000 Units total) by mouth daily.   vitamin B-12 (V-R VITAMIN B-12) 500 MCG tablet Take 1 tablet (500 mcg total) by mouth daily.   Facility-Administered Encounter Medications as of 05/02/2020  Medication   cyanocobalamin ((VITAMIN B-12)) injection 1,000 mcg    Allergies (verified) Codeine, Hctz [hydrochlorothiazide], and Statins   History: Past Medical History:  Diagnosis Date   Arthritis    Arthritis of right knee    Borderline diabetes    Burning  sensation of feet    Colitis    hx colitis-gi bleed-2006   Constipation    DYSLIPIDEMIA    Gallbladder problem    Headache    hx of 10 years ago   Hx of cardiovascular stress test    Lexiscan Myoview 9/14:  Small, fixed apical anterior perfusion defect (worse at rest) - probable soft tissue attenuation, EF 61%, low risk study   HYPERTENSION    Lower extremity edema    Partial tear of subscapularis tendon 04/22/2012   POSTMENOPAUSAL STATUS    Pre-diabetes    Supraspinatus tendon tear 04/22/2012   Tubular adenoma of colon 08/2013   Vitamin D deficiency    Past Surgical History:  Procedure Laterality Date   ABDOMINAL HYSTERECTOMY  1987   Partial   APPENDECTOMY  2003   CERVICAL FUSION  2002   COLONOSCOPY  06/03/04   isch colitis (hosp for same)   FRACTURE SURGERY     leg ? right  70 years old   [place]   umb   SHOULDER ARTHROSCOPY WITH ROTATOR CUFF REPAIR AND SUBACROMIAL DECOMPRESSION Right 04/22/2012   Procedure: SHOULDER ARTHROSCOPY WITH ROTATOR CUFF REPAIR AND SUBACROMIAL DECOMPRESSION;  Surgeon: Johnny Bridge, MD;  Location: [place];  Service: Orthopedics;  Laterality: Right;  RIGHT SHOULDER ARTHROSCOPY DEBRIDEMENT LIMITED, DECOMPRESSION SUBACROMIAL PARTIAL ACROMIOPLASTY WITH CORACOACROMIAL RELEASE, WITH ROTATOR CUFF REPAIR AND SUBSCAPULARIS REPAIR   TOTAL KNEE ARTHROPLASTY Right 08/14/2019   Procedure: TOTAL KNEE ARTHROPLASTY;  Surgeon: Elsie Saas, MD;  Location: [facility];  Service: Orthopedics;  Laterality: Right;   TUBAL LIGATION     Family History  Problem Relation Age of Onset   Hypertension Father    Stroke Father    Heart disease Father    Aneurysm Mother        brain   Stroke Mother    Stroke Sister    Hypertension Sister    Diabetes Brother    Colon cancer Neg Hx    Esophageal cancer Neg Hx    Rectal cancer Neg Hx    Stomach cancer Neg Hx    Social History   Socioeconomic History   Marital status: Married    Spouse name:  Adron Bene   Number of children: Not on file   Years of education: Not on file   Highest education level: Not on file  Occupational History   Not on file  Tobacco Use   Smoking status: Never Smoker   Smokeless tobacco: Never Used   Tobacco comment: exposed to second hand (spouse smokes), Married  Scientific laboratory technician Use: Never used  Substance and Sexual Activity   Alcohol use: No   Drug use: No   Sexual activity: Yes    Birth control/protection: Surgical  Other Topics Concern   Not on file  Social History Narrative   Not on file   Social Determinants of Health   Financial Resource Strain: Low Risk    Difficulty of Paying Living Expenses: Not hard at all  Food Insecurity: No Food Insecurity   Worried About Charity fundraiser in the Last Year: Never true   [place] in the Last Year: Never true  Transportation Needs: No Transportation Needs   Lack of Transportation (Medical): No   Lack of Transportation (Non-Medical): No  Physical Activity: Sufficiently Active   Days of Exercise per Week: 5 days   Minutes of Exercise per Session: 30 min  Stress: No Stress Concern Present   Feeling of Stress : Not at all  Social Connections: Socially Integrated   Frequency of Communication with Friends and Family: More than three times a week   Frequency of Social Gatherings with Friends and Family: More than three times a week   Attends Religious Services: More than 4 times per year   Active Member of [employer] or Organizations: Yes   Attends Music therapist: More than 4 times per year   Marital Status: Married    Tobacco Counseling Counseling given: Not Answered Comment: exposed to second hand (spouse smokes), Married   Clinical Intake:  Pre-visit preparation completed: Yes  Pain : No/denies pain Pain Score: 0-No pain     BMI - recorded: 31.95 Nutritional Status: BMI > 30  Obese Nutritional Risks: None Diabetes: No  How often do you need to have  someone help you when you read instructions, pamphlets, or other written materials from your doctor or pharmacy?: 1 - Never What is the last grade level you completed in school?: HSG; some college classes  Diabetic? no  Interpreter Needed?: No  Information entered by :: Lisette Abu, LPN   Activities of Daily Living In your present state of health, do you have any difficulty performing the following activities: 05/02/2020 08/14/2019  Hearing? N N  Vision? N N  Difficulty concentrating or making decisions? N N  Walking or climbing stairs? N Y  Dressing or bathing? N N  Doing errands, shopping? N N  Preparing Food and eating ? N -  Using the Toilet? N -  In the past six months, have you accidently leaked urine? N -  Do you have problems with loss of bowel control? N -  Managing your Medications? N -  Managing your Finances? N -  Housekeeping or managing your Housekeeping? N -  Some recent data might be hidden    Patient Care Team: Plotnikov, Evie Lacks, MD as PCP - General (Internal Medicine) Marchia Bond, MD (Orthopedic Surgery) Ladene Artist, MD (Gastroenterology) [facility], [facility] McMichael, Flonnie Hailstone, MD (Dermatology) Elsie Saas, MD as Consulting Physician (Orthopedic Surgery)  Indicate any recent Medical Services you may have received from other than [facility] providers in the past year (date may be approximate).     Assessment:   This is a routine wellness examination for [place].  Hearing/Vision screen No exam data present  Dietary issues and exercise activities discussed: Current Exercise Habits: Home exercise routine, Type of exercise: walking;Other - see comments (bike 3-4 times a week (7 miles)), Time (Minutes): 30, Frequency (Times/Week): 5, Weekly Exercise (Minutes/Week): 150, Intensity: Moderate, Exercise limited by: psychological condition(s)  Goals   None    Depression Screen PHQ 2/9 Scores 05/02/2020 04/27/2019 04/06/2019 02/15/2019  08/17/2018 07/27/2018 01/24/2018  PHQ - 2 Score 0 0 0 0 0 0 0  PHQ- 9 Score - 1 0 0 0 0 0    Fall Risk Fall Risk  05/02/2020 10/23/2019 08/17/2018 03/16/2017 12/21/2016  Falls in the past year? 0 0 0 No No  Number falls in past yr: 0 0 - - -  Injury with Fall? 0 0 - - -  Risk for fall due to : No Fall Risks No Fall Risks - - -  Follow up Falls evaluation completed Falls evaluation completed Falls evaluation completed - -    FALL RISK PREVENTION PERTAINING TO THE HOME:  Any stairs in or around the home? No  If so, are there any without handrails? No  Home free of loose throw rugs in walkways, pet beds, electrical cords, etc? Yes  Adequate lighting in your home to reduce risk of falls? Yes   ASSISTIVE DEVICES UTILIZED TO PREVENT FALLS:  Life alert? No  Use of a cane, walker or w/c? No  Grab bars in the bathroom? Yes  Shower chair or bench in shower? Yes  Elevated toilet seat or a handicapped toilet? Yes   TIMED UP AND GO:  Was the test performed? No .  Length of time to ambulate 10 feet: 0 sec.   Gait steady and fast without use of assistive device  Cognitive Function: Normal cognitive status assessed by direct observation by this Nurse Health Advisor. No abnormalities found.       6CIT Screen 08/17/2016  What Year? 0 points  What month? 0 points  What time? 0 points  Count back from 20 0 points  Months in reverse 0 points  Repeat phrase 4 points  Total Score 4    Immunizations Immunization History  Administered Date(s) Administered   PFIZER(Purple Top)SARS-COV-2 Vaccination 07/07/2019, 07/28/2019   Td 01/04/2009   Tdap 03/02/2012    TDAP status: Up to date  Flu Vaccine status: Declined, Education has been provided regarding the importance of this vaccine but patient still declined. Advised may receive this vaccine at local pharmacy or Health Dept. Aware to provide a copy of the vaccination record if obtained from local pharmacy or Health Dept. Verbalized acceptance  and understanding.  Pneumococcal vaccine status: Declined,  Education has been provided regarding the importance of this vaccine  but patient still declined. Advised may receive this vaccine at local pharmacy or Health Dept. Aware to provide a copy of the vaccination record if obtained from local pharmacy or Health Dept. Verbalized acceptance and understanding.   Covid-19 vaccine status: Completed vaccines  Qualifies for Shingles Vaccine? Yes   Zostavax completed No   Shingrix Completed?: No.    Education has been provided regarding the importance of this vaccine. Patient has been advised to call insurance company to determine out of pocket expense if they have not yet received this vaccine. Advised may also receive vaccine at local pharmacy or Health Dept. Verbalized acceptance and understanding.  Screening Tests Health Maintenance  Topic Date Due   Hepatitis C Screening  Never done   PNA vac Low Risk Adult (1 of 2 - PCV13) Never done   COVID-19 Vaccine (3 - Pfizer risk 4-dose series) 08/25/2019   INFLUENZA VACCINE  05/30/2020 (Originally 10/01/2019)   MAMMOGRAM  07/04/2021   TETANUS/TDAP  03/02/2022   COLONOSCOPY (Pts 45-35yrs Insurance coverage will need to be confirmed)  05/29/2022   DEXA SCAN  Completed   HPV VACCINES  Aged Out    Health Maintenance  Health Maintenance Due  Topic Date Due   Hepatitis C Screening  Never done   PNA vac Low Risk Adult (1 of 2 - PCV13) Never done   COVID-19 Vaccine (3 - Pfizer risk 4-dose series) 08/25/2019    Colorectal cancer screening: Type of screening: Colonoscopy. Completed 05/29/2019. Repeat every 3 years  Mammogram status: Completed 07/05/2019. Repeat every year  Bone Density status: Completed 09/07/2011. Results reflect: Bone density results: OSTEOPENIA. Repeat every 2 years.  Lung Cancer Screening: (Low Dose CT Chest recommended if Age 30-80 years, 30 pack-year currently smoking OR have quit w/in 15years.) does not qualify.   Lung Cancer  Screening Referral: no  Additional Screening:  Hepatitis C Screening: does qualify; Completed no  Vision Screening: Recommended annual ophthalmology exams for early detection of glaucoma and other disorders of the eye. Is the patient up to date with their annual eye exam?  Yes  Who is the provider or what is the name of the office in which the patient attends annual eye exams? Marygrace Drought, MD. If pt is not established with a provider, would they like to be referred to a provider to establish care? No .   Dental Screening: Recommended annual dental exams for proper oral hygiene  Community Resource Referral / Chronic Care Management: CRR required this visit?  No   CCM required this visit?  No      Plan:     I have personally reviewed and noted the following in the patients chart:   Medical and social history Use of alcohol, tobacco or illicit drugs  Current medications and supplements Functional ability and status Nutritional status Physical activity Advanced directives List of other physicians Hospitalizations, surgeries, and ER visits in previous 12 months Vitals Screenings to include cognitive, depression, and falls Referrals and appointments  In addition, I have reviewed and discussed with patient certain preventive protocols, quality metrics, and best practice recommendations. A written personalized care plan for preventive services as well as general preventive health recommendations were provided to patient.     Sheral Flow, LPN   0/10/1446   Nurse Notes:  Medications reviewed with patient; no opioid use noted.   Medical screening examination/treatment/procedure(s) were performed by non-physician practitioner and as supervising physician I was immediately available for consultation/collaboration.  I agree with above. Aleksei Plotnikov,  MD

## 2020-05-02 NOTE — Assessment & Plan Note (Signed)
Take Vit D 2000 iu daily

## 2020-05-02 NOTE — Assessment & Plan Note (Signed)
Amlodipine Maxzide Losartan

## 2020-05-02 NOTE — Assessment & Plan Note (Addendum)
Labs - lipids CT calcium score test offered

## 2020-05-02 NOTE — Progress Notes (Signed)
Subjective:  Patient ID: Bailey [name], female    DOB: [birth year]  Age: 70 y.o. MRN: [id number]  CC: Follow-up (6 MONTH F/U)   HPI CAILIE BOSSHART presents for B12 def, fatigue - better F/u HTN  Outpatient Medications Prior to Visit  Medication Sig Dispense Refill  . amLODipine-olmesartan (AZOR) 5-20 MG tablet Take 1 tablet by mouth daily.    . Cholecalciferol (VITAMIN D3) 50 MCG (2000 UT) capsule Take 1 capsule (2,000 Units total) by mouth daily. 100 capsule 3  . vitamin B-12 (V-R VITAMIN B-12) 500 MCG tablet Take 1 tablet (500 mcg total) by mouth daily. 100 tablet 3   Facility-Administered Medications Prior to Visit  Medication Dose Route Frequency Provider Last Rate Last Admin  . cyanocobalamin ((VITAMIN B-12)) injection 1,000 mcg  1,000 mcg Intramuscular Once Roberto Romanoski, Evie Lacks, MD        ROS: Review of Systems  Constitutional: Negative for activity change, appetite change, chills, diaphoresis, fatigue, fever and unexpected weight change.  HENT: Negative for congestion, dental problem, ear pain, hearing loss, mouth sores, postnasal drip, sinus pressure, sneezing, sore throat and voice change.   Eyes: Negative for pain and visual disturbance.  Respiratory: Negative for cough, chest tightness, wheezing and stridor.   Cardiovascular: Negative for chest pain, palpitations and leg swelling.  Gastrointestinal: Negative for abdominal distention, abdominal pain, blood in stool, nausea, rectal pain and vomiting.  Genitourinary: Negative for decreased urine volume, difficulty urinating, dysuria, frequency, hematuria, menstrual problem, vaginal bleeding, vaginal discharge and vaginal pain.  Musculoskeletal: Positive for arthralgias. Negative for back pain, gait problem, joint swelling and neck pain.  Skin: Negative for color change, rash and wound.  Neurological: Negative for dizziness, tremors, syncope, speech difficulty, weakness and light-headedness.  Hematological: Negative for  adenopathy.  Psychiatric/Behavioral: Negative for behavioral problems, confusion, decreased concentration, dysphoric mood, hallucinations, sleep disturbance and suicidal ideas. The patient is not nervous/anxious and is not hyperactive.     Objective:  BP 118/80 (BP Location: Left Arm)   Pulse 78   Temp 97.9 F (36.6 C) (Oral)   Ht 5\' 5"  (1.651 m)   Wt 192 lb (87.1 kg)   SpO2 97%   BMI 31.95 kg/m   BP Readings from Last 3 Encounters:  05/02/20 118/80  05/02/20 118/80  03/29/20 128/82    Wt Readings from Last 3 Encounters:  05/02/20 192 lb (87.1 kg)  05/02/20 192 lb (87.1 kg)  03/29/20 197 lb 9.6 oz (89.6 kg)    Physical Exam Constitutional:      General: She is not in acute distress.    Appearance: She is well-developed. She is obese.  HENT:     Head: Normocephalic.     Right Ear: External ear normal.     Left Ear: External ear normal.     Nose: Nose normal.     Mouth/Throat:     Mouth: Oropharynx is clear and moist.  Eyes:     General:        Right eye: No discharge.        Left eye: No discharge.     Conjunctiva/sclera: Conjunctivae normal.     Pupils: Pupils are equal, round, and reactive to light.  Neck:     Thyroid: No thyromegaly.     Vascular: No JVD.     Trachea: No tracheal deviation.  Cardiovascular:     Rate and Rhythm: Normal rate and regular rhythm.     Heart sounds: Normal heart sounds.  Pulmonary:  Effort: No respiratory distress.     Breath sounds: No stridor. No wheezing.  Abdominal:     General: Bowel sounds are normal. There is no distension.     Palpations: Abdomen is soft. There is no mass.     Tenderness: There is no abdominal tenderness. There is no guarding or rebound.  Musculoskeletal:        General: No tenderness or edema.     Cervical back: Normal range of motion and neck supple.  Lymphadenopathy:     Cervical: No cervical adenopathy.  Skin:    Findings: No erythema or rash.  Neurological:     Mental Status: She is  oriented to person, place, and time.     Cranial Nerves: No cranial nerve deficit.     Motor: No abnormal muscle tone.     Coordination: Coordination normal.     Deep Tendon Reflexes: Reflexes normal.  Psychiatric:        Mood and Affect: Mood and affect normal.        Behavior: Behavior normal.        Thought Content: Thought content normal.        Judgment: Judgment normal.     Lab Results  Component Value Date   WBC 7.4 03/29/2020   HGB 15.6 (H) 03/29/2020   HCT 45.2 03/29/2020   PLT 242.0 03/29/2020   GLUCOSE 108 (H) 03/29/2020   CHOL 220 (H) 07/10/2019   TRIG 202.0 (H) 07/10/2019   HDL 53.70 07/10/2019   LDLDIRECT 132.0 07/10/2019   LDLCALC 84 04/04/2019   ALT 15 03/29/2020   AST 16 03/29/2020   NA 138 03/29/2020   K 4.0 03/29/2020   CL 102 03/29/2020   CREATININE 0.76 03/29/2020   BUN 14 03/29/2020   CO2 30 03/29/2020   TSH 2.01 03/29/2020   INR 1.0 08/07/2019   HGBA1C 5.8 07/10/2019    VAS [place] LOWER EXTREMITY VENOUS (DVT)  Result Date: 08/31/2019  Lower Venous DVTStudy Indications: Swelling, Edema, Pain, and 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  Comparison Study: 08/20/19 - Negative RLE Venous Performing Technologist: Velva Harman Sturdivant RDMS, RVT  Examination Guidelines: A complete evaluation includes B-mode imaging, spectral Doppler, color Doppler, and power Doppler as needed of all accessible portions of each vessel. Bilateral testing is considered an integral part of a complete examination. Limited examinations for reoccurring indications may be performed as noted. The reflux portion of the exam is performed with the patient in reverse Trendelenburg.  +---------+---------------+---------+-----------+----------+--------------+ RIGHT    CompressibilityPhasicitySpontaneityPropertiesThrombus Aging +---------+---------------+---------+-----------+----------+--------------+ CFV      Full           Yes      Yes                                  +---------+---------------+---------+-----------+----------+--------------+ SFJ      Full                                                        +---------+---------------+---------+-----------+----------+--------------+ FV Prox  Full                                                        +---------+---------------+---------+-----------+----------+--------------+  FV Mid   Full                                                        +---------+---------------+---------+-----------+----------+--------------+ FV DistalFull                                                        +---------+---------------+---------+-----------+----------+--------------+ PFV      Full                                                        +---------+---------------+---------+-----------+----------+--------------+ POP      Full           Yes      Yes                                 +---------+---------------+---------+-----------+----------+--------------+ PTV      Full                                                        +---------+---------------+---------+-----------+----------+--------------+ PERO     Full                                                        +---------+---------------+---------+-----------+----------+--------------+   +----+---------------+---------+-----------+----------+--------------+ LEFTCompressibilityPhasicitySpontaneityPropertiesThrombus Aging +----+---------------+---------+-----------+----------+--------------+ CFV Full           Yes      Yes                                 +----+---------------+---------+-----------+----------+--------------+ SFJ Full                                                        +----+---------------+---------+-----------+----------+--------------+     Summary: RIGHT: - There is no evidence of deep vein thrombosis in the lower extremity.  - A complex structure with mixed echoes in the right popliteal  fossa with sonographic characteristics for a rupture Baker's cyst was noted measuring 4.6 x 3.0 x 1.7 cm  LEFT: - No evidence of common femoral vein obstruction.  *See table(s) above for measurements and observations. Electronically signed by Servando Snare MD on 08/31/2019 at 4:38:04 PM.    Final     Assessment & Plan:    Walker Kehr, MD

## 2020-05-02 NOTE — Patient Instructions (Signed)
Bailey [name] , Thank you for taking time to come for your Medicare Wellness Visit. I appreciate your ongoing commitment to your health goals. Please review the following plan we discussed and let me know if I can assist you in the future.   Screening recommendations/referrals: Colonoscopy: 05/29/2019; due every 3 years Mammogram: 07/05/2019 Bone Density: 09/07/2011; (due every 25 months); not a fracture risk Recommended yearly ophthalmology/optometry visit for glaucoma screening and checkup Recommended yearly dental visit for hygiene and checkup  Vaccinations: Influenza vaccine: declined Pneumococcal vaccine: declined Tdap vaccine: 03/02/2012; due every 10 years (2024); declined Shingles vaccine: declined    Covid-19: 07/07/2019, 07/28/2019  Advanced directives: Please bring a copy of your health care power of attorney and living will to the office at your convenience.   Conditions/risks identified: Yes; Reviewed health maintenance screenings with patient today and relevant education, vaccines, and/or referrals were provided. Please continue to do your personal lifestyle choices by: daily care of teeth and gums, regular physical activity (goal should be 5 days a week for 30 minutes), eat a healthy diet, avoid tobacco and drug use, limiting any alcohol intake, taking a low-dose aspirin (if not allergic or have been advised by your provider otherwise) and taking vitamins and minerals as recommended by your provider. Continue doing brain stimulating activities (puzzles, reading, adult coloring books, staying active) to keep memory sharp. Continue to eat heart healthy diet (full of fruits, vegetables, whole grains, lean protein, water--limit salt, fat, and sugar intake) and increase physical activity as tolerated.  Next appointment: Please schedule your next Medicare Wellness Visit with your Nurse Health Advisor in 1 year by calling [phone].  Preventive Care 39 Years and Older, Female Preventive care  refers to lifestyle choices and visits with your health care provider that can promote health and wellness. What does preventive care include?  A yearly physical exam. This is also called an annual well check.  Dental exams once or twice a year.  Routine eye exams. Ask your health care provider how often you should have your eyes checked.  Personal lifestyle choices, including:  Daily care of your teeth and gums.  Regular physical activity.  Eating a healthy diet.  Avoiding tobacco and drug use.  Limiting alcohol use.  Practicing safe sex.  Taking low-dose aspirin every day.  Taking vitamin and mineral supplements as recommended by your health care provider. What happens during an annual well check? The services and screenings done by your health care provider during your annual well check will depend on your age, overall health, lifestyle risk factors, and family history of disease. Counseling  Your health care provider may ask you questions about your:  Alcohol use.  Tobacco use.  Drug use.  Emotional well-being.  Home and relationship well-being.  Sexual activity.  Eating habits.  History of falls.  Memory and ability to understand (cognition).  Work and work Statistician.  Reproductive health. Screening  You may have the following tests or measurements:  Height, weight, and BMI.  Blood pressure.  Lipid and cholesterol levels. These may be checked every 5 years, or more frequently if you are over 56 years old.  Skin check.  Lung cancer screening. You may have this screening every year starting at age 48 if you have a 30-pack-year history of smoking and currently smoke or have quit within the past 15 years.  Fecal occult blood test (FOBT) of the stool. You may have this test every year starting at age 86.  Flexible sigmoidoscopy or colonoscopy.  You may have a sigmoidoscopy every 5 years or a colonoscopy every 10 years starting at age  46.  Hepatitis C blood test.  Hepatitis B blood test.  Sexually transmitted disease (STD) testing.  Diabetes screening. This is done by checking your blood sugar (glucose) after you have not eaten for a while (fasting). You may have this done every 1-3 years.  Bone density scan. This is done to screen for osteoporosis. You may have this done starting at age 45.  Mammogram. This may be done every 1-2 years. Talk to your health care provider about how often you should have regular mammograms. Talk with your health care provider about your test results, treatment options, and if necessary, the need for more tests. Vaccines  Your health care provider may recommend certain vaccines, such as:  Influenza vaccine. This is recommended every year.  Tetanus, diphtheria, and acellular pertussis (Tdap, Td) vaccine. You may need a Td booster every 10 years.  Zoster vaccine. You may need this after age 55.  Pneumococcal 13-valent conjugate (PCV13) vaccine. One dose is recommended after age 62.  Pneumococcal polysaccharide (PPSV23) vaccine. One dose is recommended after age 63. Talk to your health care provider about which screenings and vaccines you need and how often you need them. This information is not intended to replace advice given to you by your health care provider. Make sure you discuss any questions you have with your health care provider. Document Released: 03/15/2015 Document Revised: 11/06/2015 Document Reviewed: 12/18/2014 Elsevier Interactive Patient Education  2017 [place] Prevention in the Home Falls can cause injuries. They can happen to people of all ages. There are many things you can do to make your home safe and to help prevent falls. What can I do on the outside of my home?  Regularly fix the edges of walkways and driveways and fix any cracks.  Remove anything that might make you trip as you walk through a door, such as a raised step or threshold.  Trim  any bushes or trees on the path to your home.  Use bright outdoor lighting.  Clear any walking paths of anything that might make someone trip, such as rocks or tools.  Regularly check to see if handrails are loose or broken. Make sure that both sides of any steps have handrails.  Any raised decks and porches should have guardrails on the edges.  Have any leaves, snow, or ice cleared regularly.  Use sand or salt on walking paths during winter.  Clean up any spills in your garage right away. This includes oil or grease spills. What can I do in the bathroom?  Use night lights.  Install grab bars by the toilet and in the tub and shower. Do not use towel bars as grab bars.  Use non-skid mats or decals in the tub or shower.  If you need to sit down in the shower, use a plastic, non-slip stool.  Keep the floor dry. Clean up any water that spills on the floor as soon as it happens.  Remove soap buildup in the tub or shower regularly.  Attach bath mats securely with double-sided non-slip rug tape.  Do not have throw rugs and other things on the floor that can make you trip. What can I do in the bedroom?  Use night lights.  Make sure that you have a light by your bed that is easy to reach.  Do not use any sheets or blankets that are too big  for your bed. They should not hang down onto the floor.  Have a firm chair that has side arms. You can use this for support while you get dressed.  Do not have throw rugs and other things on the floor that can make you trip. What can I do in the kitchen?  Clean up any spills right away.  Avoid walking on wet floors.  Keep items that you use a lot in easy-to-reach places.  If you need to reach something above you, use a strong step stool that has a grab bar.  Keep electrical cords out of the way.  Do not use floor polish or wax that makes floors slippery. If you must use wax, use non-skid floor wax.  Do not have throw rugs and other  things on the floor that can make you trip. What can I do with my stairs?  Do not leave any items on the stairs.  Make sure that there are handrails on both sides of the stairs and use them. Fix handrails that are broken or loose. Make sure that handrails are as long as the stairways.  Check any carpeting to make sure that it is firmly attached to the stairs. Fix any carpet that is loose or worn.  Avoid having throw rugs at the top or bottom of the stairs. If you do have throw rugs, attach them to the floor with carpet tape.  Make sure that you have a light switch at the top of the stairs and the bottom of the stairs. If you do not have them, ask someone to add them for you. What else can I do to help prevent falls?  Wear shoes that:  Do not have high heels.  Have rubber bottoms.  Are comfortable and fit you well.  Are closed at the toe. Do not wear sandals.  If you use a stepladder:  Make sure that it is fully opened. Do not climb a closed stepladder.  Make sure that both sides of the stepladder are locked into place.  Ask someone to hold it for you, if possible.  Clearly mark and make sure that you can see:  Any grab bars or handrails.  First and last steps.  Where the edge of each step is.  Use tools that help you move around (mobility aids) if they are needed. These include:  Canes.  Walkers.  Scooters.  Crutches.  Turn on the lights when you go into a dark area. Replace any light bulbs as soon as they burn out.  Set up your furniture so you have a clear path. Avoid moving your furniture around.  If any of your floors are uneven, fix them.  If there are any pets around you, be aware of where they are.  Review your medicines with your doctor. Some medicines can make you feel dizzy. This can increase your chance of falling. Ask your doctor what other things that you can do to help prevent falls. This information is not intended to replace advice given to  you by your health care provider. Make sure you discuss any questions you have with your health care provider. Document Released: 12/13/2008 Document Revised: 07/25/2015 Document Reviewed: 03/23/2014 Elsevier Interactive Patient Education  2017 [employer].

## 2020-07-10 ENCOUNTER — Other Ambulatory Visit: Payer: Self-pay | Admitting: Internal Medicine

## 2020-08-02 ENCOUNTER — Other Ambulatory Visit (INDEPENDENT_AMBULATORY_CARE_PROVIDER_SITE_OTHER): Payer: Medicare Other

## 2020-08-02 ENCOUNTER — Other Ambulatory Visit: Payer: Self-pay

## 2020-08-02 DIAGNOSIS — E7849 Other hyperlipidemia: Secondary | ICD-10-CM

## 2020-08-02 DIAGNOSIS — E538 Deficiency of other specified B group vitamins: Secondary | ICD-10-CM | POA: Diagnosis not present

## 2020-08-02 DIAGNOSIS — E559 Vitamin D deficiency, unspecified: Secondary | ICD-10-CM

## 2020-08-02 DIAGNOSIS — I10 Essential (primary) hypertension: Secondary | ICD-10-CM

## 2020-08-02 LAB — CBC WITH DIFFERENTIAL/PLATELET
Basophils Absolute: 0 10*3/uL (ref 0.0–0.1)
Basophils Relative: 0.6 % (ref 0.0–3.0)
Eosinophils Absolute: 0.1 10*3/uL (ref 0.0–0.7)
Eosinophils Relative: 1.2 % (ref 0.0–5.0)
HCT: 39.8 % (ref 36.0–46.0)
Hemoglobin: 13.8 g/dL (ref 12.0–15.0)
Lymphocytes Relative: 31 % (ref 12.0–46.0)
Lymphs Abs: 2 10*3/uL (ref 0.7–4.0)
MCHC: 34.7 g/dL (ref 30.0–36.0)
MCV: 92 fl (ref 78.0–100.0)
Monocytes Absolute: 0.4 10*3/uL (ref 0.1–1.0)
Monocytes Relative: 6.9 % (ref 3.0–12.0)
Neutro Abs: 3.9 10*3/uL (ref 1.4–7.7)
Neutrophils Relative %: 60.3 % (ref 43.0–77.0)
Platelets: 238 10*3/uL (ref 150.0–400.0)
RBC: 4.33 Mil/uL (ref 3.87–5.11)
RDW: 13.4 % (ref 11.5–15.5)
WBC: 6.5 10*3/uL (ref 4.0–10.5)

## 2020-08-02 LAB — VITAMIN B12: Vitamin B-12: 787 pg/mL (ref 211–911)

## 2020-08-02 LAB — URINALYSIS
Bilirubin Urine: NEGATIVE
Ketones, ur: NEGATIVE
Leukocytes,Ua: NEGATIVE
Nitrite: NEGATIVE
Specific Gravity, Urine: <=1.005 — AB (ref 1.000–1.030)
Total Protein, Urine: NEGATIVE
Urine Glucose: NEGATIVE
Urobilinogen, UA: 0.2 (ref 0.0–1.0)
pH: 6 (ref 5.0–8.0)

## 2020-08-02 LAB — TSH: TSH: 1.34 u[IU]/mL (ref 0.35–4.50)

## 2020-08-02 LAB — VITAMIN D 25 HYDROXY (VIT D DEFICIENCY, FRACTURES): VITD: 50.77 ng/mL (ref 30.00–100.00)

## 2020-08-05 LAB — LIPID PANEL
Cholesterol: 201 mg/dL — ABNORMAL HIGH (ref 0–200)
HDL: 54 mg/dL (ref >39.00)
LDL Cholesterol: 131 mg/dL — ABNORMAL HIGH (ref 0–99)
NonHDL: 147
Total CHOL/HDL Ratio: 4
Triglycerides: 80 mg/dL (ref 0.0–149.0)
VLDL: 16 mg/dL (ref 0.0–40.0)

## 2020-08-05 LAB — COMPREHENSIVE METABOLIC PANEL
ALT: 13 U/L (ref 0–35)
AST: 21 U/L (ref 0–37)
Albumin: 4.4 g/dL (ref 3.5–5.2)
Alkaline Phosphatase: 83 U/L (ref 39–117)
BUN: 16 mg/dL (ref 6–23)
CO2: 25 mEq/L (ref 19–32)
Calcium: 10.5 mg/dL (ref 8.4–10.5)
Chloride: 105 mEq/L (ref 96–112)
Creatinine, Ser: 0.71 mg/dL (ref 0.40–1.20)
GFR: 86.41 mL/min (ref >60.00)
Glucose, Bld: 99 mg/dL (ref 70–99)
Potassium: 4.6 mEq/L (ref 3.5–5.1)
Sodium: 140 mEq/L (ref 135–145)
Total Bilirubin: 0.7 mg/dL (ref 0.2–1.2)
Total Protein: 7.5 g/dL (ref 6.0–8.3)

## 2020-08-05 LAB — MAGNESIUM: Magnesium: 2.1 mg/dL (ref 1.5–2.5)

## 2020-08-07 ENCOUNTER — Ambulatory Visit (INDEPENDENT_AMBULATORY_CARE_PROVIDER_SITE_OTHER): Payer: Medicare Other | Admitting: Internal Medicine

## 2020-08-07 ENCOUNTER — Encounter: Payer: Self-pay | Admitting: Internal Medicine

## 2020-08-07 ENCOUNTER — Other Ambulatory Visit: Payer: Self-pay

## 2020-08-07 DIAGNOSIS — R350 Frequency of micturition: Secondary | ICD-10-CM

## 2020-08-07 DIAGNOSIS — E669 Obesity, unspecified: Secondary | ICD-10-CM | POA: Diagnosis not present

## 2020-08-07 DIAGNOSIS — R32 Unspecified urinary incontinence: Secondary | ICD-10-CM | POA: Diagnosis not present

## 2020-08-07 DIAGNOSIS — E785 Hyperlipidemia, unspecified: Secondary | ICD-10-CM | POA: Diagnosis not present

## 2020-08-07 NOTE — Assessment & Plan Note (Signed)
Cont w/wt loss 

## 2020-08-07 NOTE — Progress Notes (Signed)
Subjective:  Patient ID: Bailey [name], female    DOB: [birth year]  Age: 70 y.o. MRN: [id number]  CC: Follow-up (3 month f/u)   HPI Bailey [name] presents for dyslipidemia, HTN, B12 def f/u. C/o urinary overflow incontinence - worse  Outpatient Medications Prior to Visit  Medication Sig Dispense Refill  . amLODipine-olmesartan (AZOR) 5-20 MG tablet TAKE 1 TABLET BY MOUTH EVERY DAY 90 tablet 3  . Cholecalciferol (VITAMIN D3) 50 MCG (2000 UT) capsule Take 1 capsule (2,000 Units total) by mouth daily. 100 capsule 3  . vitamin B-12 (V-R VITAMIN B-12) 500 MCG tablet Take 1 tablet (500 mcg total) by mouth daily. 100 tablet 3  . cyanocobalamin ((VITAMIN B-12)) injection 1,000 mcg      No facility-administered medications prior to visit.    ROS: Review of Systems  Constitutional: Negative for activity change, appetite change, chills, fatigue and unexpected weight change.  HENT: Negative for congestion, mouth sores and sinus pressure.   Eyes: Negative for visual disturbance.  Respiratory: Negative for cough and chest tightness.   Gastrointestinal: Negative for abdominal pain and nausea.  Genitourinary: Negative for difficulty urinating, frequency and vaginal pain.  Musculoskeletal: Negative for back pain and gait problem.  Skin: Negative for pallor and rash.  Neurological: Negative for dizziness, tremors, weakness, numbness and headaches.  Psychiatric/Behavioral: Negative for confusion and sleep disturbance.    Objective:  BP 130/84 (BP Location: Left Arm)   Pulse 66   Temp 97.9 F (36.6 C) (Oral)   Ht 5\' 5"  (1.651 m)   Wt 189 lb 9.6 oz (86 kg)   SpO2 96%   BMI 31.55 kg/m   BP Readings from Last 3 Encounters:  08/07/20 130/84  05/02/20 118/80  05/02/20 118/80    Wt Readings from Last 3 Encounters:  08/07/20 189 lb 9.6 oz (86 kg)  05/02/20 192 lb (87.1 kg)  05/02/20 192 lb (87.1 kg)    Physical Exam Constitutional:      General: She is not in acute distress.     Appearance: She is well-developed. She is obese.  HENT:     Head: Normocephalic.     Right Ear: External ear normal.     Left Ear: External ear normal.     Nose: Nose normal.  Eyes:     General:        Right eye: No discharge.        Left eye: No discharge.     Conjunctiva/sclera: Conjunctivae normal.     Pupils: Pupils are equal, round, and reactive to light.  Neck:     Thyroid: No thyromegaly.     Vascular: No JVD.     Trachea: No tracheal deviation.  Cardiovascular:     Rate and Rhythm: Normal rate and regular rhythm.     Heart sounds: Normal heart sounds.  Pulmonary:     Effort: No respiratory distress.     Breath sounds: No stridor. No wheezing.  Abdominal:     General: Bowel sounds are normal. There is no distension.     Palpations: Abdomen is soft. There is no mass.     Tenderness: There is no abdominal tenderness. There is no guarding or rebound.  Musculoskeletal:        General: No tenderness.     Cervical back: Normal range of motion and neck supple.  Lymphadenopathy:     Cervical: No cervical adenopathy.  Skin:    Findings: No erythema or rash.  Neurological:  Mental Status: She is oriented to person, place, and time.     Cranial Nerves: No cranial nerve deficit.     Motor: No abnormal muscle tone.     Coordination: Coordination normal.     Deep Tendon Reflexes: Reflexes normal.  Psychiatric:        Behavior: Behavior normal.        Thought Content: Thought content normal.        Judgment: Judgment normal.     Lab Results  Component Value Date   WBC 6.5 08/02/2020   HGB 13.8 08/02/2020   HCT 39.8 08/02/2020   PLT 238.0 08/02/2020   GLUCOSE 99 08/02/2020   CHOL 201 (H) 08/02/2020   TRIG 80.0 08/02/2020   HDL 54.00 08/02/2020   LDLDIRECT 132.0 07/10/2019   LDLCALC 131 (H) 08/02/2020   ALT 13 08/02/2020   AST 21 08/02/2020   NA 140 08/02/2020   K 4.6 08/02/2020   CL 105 08/02/2020   CREATININE 0.71 08/02/2020   BUN 16 08/02/2020   CO2 25  08/02/2020   TSH 1.34 08/02/2020   INR 1.0 08/07/2019   HGBA1C 5.8 07/10/2019    VAS [place] LOWER EXTREMITY VENOUS (DVT)  Result Date: 08/31/2019  Lower Venous DVTStudy Indications: Swelling, Edema, Pain, and 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  Comparison Study: 08/20/19 - Negative RLE Venous Performing Technologist: Velva Harman Sturdivant RDMS, RVT  Examination Guidelines: A complete evaluation includes B-mode imaging, spectral Doppler, color Doppler, and power Doppler as needed of all accessible portions of each vessel. Bilateral testing is considered an integral part of a complete examination. Limited examinations for reoccurring indications may be performed as noted. The reflux portion of the exam is performed with the patient in reverse Trendelenburg.  +---------+---------------+---------+-----------+----------+--------------+ RIGHT    CompressibilityPhasicitySpontaneityPropertiesThrombus Aging +---------+---------------+---------+-----------+----------+--------------+ CFV      Full           Yes      Yes                                 +---------+---------------+---------+-----------+----------+--------------+ SFJ      Full                                                        +---------+---------------+---------+-----------+----------+--------------+ FV Prox  Full                                                        +---------+---------------+---------+-----------+----------+--------------+ FV Mid   Full                                                        +---------+---------------+---------+-----------+----------+--------------+ FV DistalFull                                                        +---------+---------------+---------+-----------+----------+--------------+  PFV      Full                                                        +---------+---------------+---------+-----------+----------+--------------+ POP      Full           Yes       Yes                                 +---------+---------------+---------+-----------+----------+--------------+ PTV      Full                                                        +---------+---------------+---------+-----------+----------+--------------+ PERO     Full                                                        +---------+---------------+---------+-----------+----------+--------------+   +----+---------------+---------+-----------+----------+--------------+ LEFTCompressibilityPhasicitySpontaneityPropertiesThrombus Aging +----+---------------+---------+-----------+----------+--------------+ CFV Full           Yes      Yes                                 +----+---------------+---------+-----------+----------+--------------+ SFJ Full                                                        +----+---------------+---------+-----------+----------+--------------+     Summary: RIGHT: - There is no evidence of deep vein thrombosis in the lower extremity.  - A complex structure with mixed echoes in the right popliteal fossa with sonographic characteristics for a rupture Baker's cyst was noted measuring 4.6 x 3.0 x 1.7 cm  LEFT: - No evidence of common femoral vein obstruction.  *See table(s) above for measurements and observations. Electronically signed by Servando Snare MD on 08/31/2019 at 4:38:04 PM.    Final     Assessment & Plan:    Walker Kehr, MD

## 2020-08-07 NOTE — Assessment & Plan Note (Signed)
Chronic  Statin intolerant A  cardiac CT scan for calcium scoring offered

## 2020-08-07 NOTE — Patient Instructions (Signed)

## 2020-08-07 NOTE — Assessment & Plan Note (Addendum)
urinary overflow incontinence - worse  Will ref to Dr Maryland Pink

## 2020-08-07 NOTE — Assessment & Plan Note (Signed)
urinary overflow incontinence - worse  Will ref to Dr Maryland Pink

## 2020-08-13 DIAGNOSIS — M1711 Unilateral primary osteoarthritis, right knee: Secondary | ICD-10-CM | POA: Diagnosis not present

## 2020-08-13 DIAGNOSIS — S9031XA Contusion of right foot, initial encounter: Secondary | ICD-10-CM | POA: Diagnosis not present

## 2020-08-14 ENCOUNTER — Telehealth: Payer: Self-pay | Admitting: Internal Medicine

## 2020-08-14 DIAGNOSIS — R32 Unspecified urinary incontinence: Secondary | ICD-10-CM

## 2020-08-14 NOTE — Telephone Encounter (Signed)
Patient called and said that she is wanting to see Dr. Maryland Pink at [facility]. Please advise   Phone: [phone]  [address], [place], [place] [address]

## 2020-08-17 NOTE — Telephone Encounter (Signed)
Done. Thanks.

## 2020-08-17 NOTE — Addendum Note (Signed)
Addended by: Cassandria Anger on: 08/17/2020 07:15 PM   Modules accepted: Orders

## 2020-09-26 NOTE — Telephone Encounter (Signed)
Per Mary/PCC.. 1st referral was sent to [facility]. Pt does not want to go there. Need new referral for [place]. Place new ref order.Marland KitchenJohny Chess

## 2020-09-26 NOTE — Telephone Encounter (Signed)
Calling back about referral to urologist.  Says she contacted [facility] twice & they advised her they have not gotten the referral paperwork  Would like it to be sent again:   [facility]. Phone: [phone] [address] [place], [place] [address]  Callback # [phone]

## 2020-09-26 NOTE — Addendum Note (Signed)
Addended by: Earnstine Regal on: 09/26/2020 03:07 PM   Modules accepted: Orders

## 2020-09-26 NOTE — Telephone Encounter (Signed)
Per chart MD placed referral 6/23. Forwarding msg to [facility] to f/u on referral../lmb

## 2020-11-08 ENCOUNTER — Telehealth: Payer: Self-pay | Admitting: Internal Medicine

## 2020-11-08 NOTE — Telephone Encounter (Signed)
   Patient requesting referral to Dr Maryland Pink [phone]) be changed to urgent due to ongoing bladder issues.   Patient aware provider out of office today

## 2020-11-11 NOTE — Telephone Encounter (Signed)
Last referral for Dr Maryland Pink was placed on 09/26/2020.  It should be current.  Thanks

## 2020-11-26 DIAGNOSIS — H0100B Unspecified blepharitis left eye, upper and lower eyelids: Secondary | ICD-10-CM | POA: Diagnosis not present

## 2020-11-26 DIAGNOSIS — H43813 Vitreous degeneration, bilateral: Secondary | ICD-10-CM | POA: Diagnosis not present

## 2020-11-26 DIAGNOSIS — H2513 Age-related nuclear cataract, bilateral: Secondary | ICD-10-CM | POA: Diagnosis not present

## 2020-11-26 DIAGNOSIS — H0100A Unspecified blepharitis right eye, upper and lower eyelids: Secondary | ICD-10-CM | POA: Diagnosis not present

## 2020-12-05 DIAGNOSIS — N3281 Overactive bladder: Secondary | ICD-10-CM | POA: Diagnosis not present

## 2020-12-05 DIAGNOSIS — N819 Female genital prolapse, unspecified: Secondary | ICD-10-CM | POA: Insufficient documentation

## 2021-01-27 ENCOUNTER — Other Ambulatory Visit: Payer: Self-pay

## 2021-01-27 ENCOUNTER — Other Ambulatory Visit (INDEPENDENT_AMBULATORY_CARE_PROVIDER_SITE_OTHER): Payer: Medicare Other

## 2021-01-27 DIAGNOSIS — E785 Hyperlipidemia, unspecified: Secondary | ICD-10-CM

## 2021-01-27 DIAGNOSIS — R32 Unspecified urinary incontinence: Secondary | ICD-10-CM | POA: Diagnosis not present

## 2021-01-27 LAB — COMPREHENSIVE METABOLIC PANEL
ALT: 13 U/L (ref 0–35)
AST: 15 U/L (ref 0–37)
Albumin: 4.3 g/dL (ref 3.5–5.2)
Alkaline Phosphatase: 85 U/L (ref 39–117)
BUN: 16 mg/dL (ref 6–23)
CO2: 27 mEq/L (ref 19–32)
Calcium: 10.1 mg/dL (ref 8.4–10.5)
Chloride: 103 mEq/L (ref 96–112)
Creatinine, Ser: 0.75 mg/dL (ref 0.40–1.20)
GFR: 80.64 mL/min (ref >60.00)
Glucose, Bld: 113 mg/dL — ABNORMAL HIGH (ref 70–99)
Potassium: 3.9 mEq/L (ref 3.5–5.1)
Sodium: 137 mEq/L (ref 135–145)
Total Bilirubin: 0.8 mg/dL (ref 0.2–1.2)
Total Protein: 7.2 g/dL (ref 6.0–8.3)

## 2021-01-27 LAB — LIPID PANEL
Cholesterol: 227 mg/dL — ABNORMAL HIGH (ref 0–200)
HDL: 50.9 mg/dL (ref >39.00)
LDL Cholesterol: 158 mg/dL — ABNORMAL HIGH (ref 0–99)
NonHDL: 176.06
Total CHOL/HDL Ratio: 4
Triglycerides: 91 mg/dL (ref 0.0–149.0)
VLDL: 18.2 mg/dL (ref 0.0–40.0)

## 2021-01-29 ENCOUNTER — Telehealth: Payer: Self-pay | Admitting: Internal Medicine

## 2021-01-29 NOTE — Telephone Encounter (Signed)
Pt. Requesting provider assistant to call her, in reference to her husband. States that provider sees him as well. He does not sleep well at night, as he has recently had open heart surgery.      Callback #- [phone]

## 2021-01-30 ENCOUNTER — Ambulatory Visit: Payer: Medicare Other | Admitting: Internal Medicine

## 2021-01-31 NOTE — Telephone Encounter (Signed)
Spoke w/ wife she states husband had been so restless since surgery. Had conference call w/ surgeon this morning and they have everything taken care of. Surgeon rx Tramadol and he will still take alprazolam. As needed.Marland KitchenJohny [name]

## 2021-02-20 ENCOUNTER — Telehealth: Payer: Self-pay

## 2021-02-20 MED ORDER — MOLNUPIRAVIR EUA 200MG CAPSULE: 4.0000 | ORAL_CAPSULE | Freq: Two times a day (BID) | ORAL | 0 refills | Status: AC

## 2021-02-20 NOTE — Telephone Encounter (Signed)
Ok Ill send a Rx OV or VOV next week pls for a f/u Thx

## 2021-02-20 NOTE — Telephone Encounter (Signed)
I called the pt to advise her to pick up meds and also scheduled her for VOV for 12/29

## 2021-02-20 NOTE — Telephone Encounter (Signed)
Pt was exposed to [place] Husband and requesting antiviral med. Symptoms started 12/21  coughing, fever 101.5, congestion, fatigue, states she just feels real bad. Taking Tylenol and took one Norel this morning per Pharmacist.  Pt wants a call back with best options.

## 2021-02-27 ENCOUNTER — Telehealth: Payer: Medicare Other | Admitting: Internal Medicine

## 2021-03-20 ENCOUNTER — Ambulatory Visit: Payer: Medicare Other | Admitting: Internal Medicine

## 2021-04-21 ENCOUNTER — Telehealth: Payer: Self-pay | Admitting: Internal Medicine

## 2021-04-21 NOTE — Telephone Encounter (Signed)
Called pt to schedule AWV with [facility], but was not able to LVM because [facility] was full.

## 2021-04-29 DIAGNOSIS — R3914 Feeling of incomplete bladder emptying: Secondary | ICD-10-CM | POA: Diagnosis not present

## 2021-04-29 DIAGNOSIS — N8110 Cystocele, unspecified: Secondary | ICD-10-CM | POA: Diagnosis not present

## 2021-05-02 ENCOUNTER — Telehealth: Payer: Self-pay

## 2021-05-02 NOTE — Telephone Encounter (Signed)
Pt has notice on the Lt side between side and Navel a rash that she is concerned that it could shingles. Rash is about 4in long and is blistered and is itching not hurting. Pt recently had a procedure and still has indwelling catheter. ? ?Scheduled pt OV for Monday 3/6. ? ?

## 2021-05-05 ENCOUNTER — Other Ambulatory Visit: Payer: Self-pay

## 2021-05-05 ENCOUNTER — Ambulatory Visit (INDEPENDENT_AMBULATORY_CARE_PROVIDER_SITE_OTHER): Payer: Medicare Other | Admitting: Internal Medicine

## 2021-05-05 ENCOUNTER — Encounter: Payer: Self-pay | Admitting: Internal Medicine

## 2021-05-05 DIAGNOSIS — I10 Essential (primary) hypertension: Secondary | ICD-10-CM

## 2021-05-05 DIAGNOSIS — R32 Unspecified urinary incontinence: Secondary | ICD-10-CM

## 2021-05-05 DIAGNOSIS — B029 Zoster without complications: Secondary | ICD-10-CM | POA: Diagnosis not present

## 2021-05-05 MED ORDER — TRAMADOL HCL 50 MG PO TABS
50.0000 mg | ORAL_TABLET | Freq: Four times a day (QID) | ORAL | 1 refills | Status: AC | PRN
Start: 2021-05-05 — End: 2021-05-10

## 2021-05-05 MED ORDER — VALACYCLOVIR HCL 1 G PO TABS: 1000.0000 mg | ORAL_TABLET | Freq: Three times a day (TID) | ORAL | 0 refills | Status: DC

## 2021-05-05 MED ORDER — TRIAMCINOLONE ACETONIDE 0.5 % EX CREA: 1.0000 "application " | TOPICAL_CREAM | Freq: Four times a day (QID) | CUTANEOUS | 1 refills | Status: AC

## 2021-05-05 NOTE — Assessment & Plan Note (Signed)
S/p surgery 1 week ago ?Tramadol prn ?

## 2021-05-05 NOTE — Progress Notes (Signed)
Subjective:  Patient ID: Bailey [name], female    DOB: [birth year]  Age: 71 y.o. MRN: [id number]  CC: Rash (Red, blistered rash located on the left lower quadrant. /Started as an itch a day before her surgery, after surgery pt noticed the rash. Has been using cortisone cream 2-3x daily. )   HPI JOURNEY CASTONGUAY presents for Rash (Red, blistered rash located on the left lower quadrant. /Started as an itch a day before her surgery, after surgery pt noticed the rash >1 week. Has been using cortisone cream 2-3x daily. Pt had a bladder lift 1 week ago - Dr Zigmund Daniel  Outpatient Medications Prior to Visit  Medication Sig Dispense Refill   amLODipine-olmesartan (AZOR) 5-20 MG tablet TAKE 1 TABLET BY MOUTH EVERY DAY 90 tablet 3   Cholecalciferol (VITAMIN D3) 50 MCG (2000 UT) capsule Take 1 capsule (2,000 Units total) by mouth daily. 100 capsule 3   vitamin B-12 (V-R VITAMIN B-12) 500 MCG tablet Take 1 tablet (500 mcg total) by mouth daily. 100 tablet 3   traMADol (ULTRAM) 50 MG tablet Take 50 mg by mouth every 6 (six) hours as needed.     No facility-administered medications prior to visit.    ROS: Review of Systems  Constitutional:  Negative for activity change, appetite change, chills, fatigue and unexpected weight change.  HENT:  Negative for congestion, mouth sores and sinus pressure.   Eyes:  Negative for visual disturbance.  Respiratory:  Negative for cough and chest tightness.   Gastrointestinal:  Negative for abdominal pain and nausea.  Genitourinary:  Negative for difficulty urinating, frequency and vaginal pain.  Musculoskeletal:  Negative for back pain and gait problem.  Skin:  Positive for rash. Negative for pallor.  Neurological:  Negative for dizziness, tremors, weakness, numbness and headaches.  Psychiatric/Behavioral:  Negative for confusion and sleep disturbance. The patient is nervous/anxious.    Objective:  BP 122/84 (BP Location: Left Arm, Patient Position: Standing,  Cuff Size: Large)    Pulse 65    Temp 98.1 F (36.7 C) (Oral)    Ht '5\' 5"'$  (1.651 m)    Wt 191 lb (86.6 kg)    SpO2 97%    BMI 31.78 kg/m   BP Readings from Last 3 Encounters:  05/05/21 122/84  08/07/20 130/84  05/02/20 118/80    Wt Readings from Last 3 Encounters:  05/05/21 191 lb (86.6 kg)  08/07/20 189 lb 9.6 oz (86 kg)  05/02/20 192 lb (87.1 kg)    Physical Exam Constitutional:      General: She is not in acute distress.    Appearance: She is well-developed.  HENT:     Head: Normocephalic.     Right Ear: External ear normal.     Left Ear: External ear normal.     Nose: Nose normal.  Eyes:     General:        Right eye: No discharge.        Left eye: No discharge.     Conjunctiva/sclera: Conjunctivae normal.     Pupils: Pupils are equal, round, and reactive to light.  Neck:     Thyroid: No thyromegaly.     Vascular: No JVD.     Trachea: No tracheal deviation.  Cardiovascular:     Rate and Rhythm: Normal rate and regular rhythm.     Heart sounds: Normal heart sounds.  Pulmonary:     Effort: No respiratory distress.     Breath sounds: No stridor. No  wheezing.  Abdominal:     General: Bowel sounds are normal. There is no distension.     Palpations: Abdomen is soft. There is no mass.     Tenderness: There is no abdominal tenderness. There is no guarding or rebound.  Musculoskeletal:        General: Tenderness present.     Cervical back: Normal range of motion and neck supple. No rigidity.  Lymphadenopathy:     Cervical: No cervical adenopathy.  Skin:    Findings: Erythema and rash present.  Neurological:     Mental Status: She is oriented to person, place, and time.     Cranial Nerves: No cranial nerve deficit.     Motor: No abnormal muscle tone.     Coordination: Coordination normal.     Deep Tendon Reflexes: Reflexes normal.  Psychiatric:        Behavior: Behavior normal.        Thought Content: Thought content normal.        Judgment: Judgment normal.    L flank shingles - blisters on eryth base    Lab Results  Component Value Date   WBC 6.5 08/02/2020   HGB 13.8 08/02/2020   HCT 39.8 08/02/2020   PLT 238.0 08/02/2020   GLUCOSE 113 (H) 01/27/2021   CHOL 227 (H) 01/27/2021   TRIG 91.0 01/27/2021   HDL 50.90 01/27/2021   LDLDIRECT 132.0 07/10/2019   LDLCALC 158 (H) 01/27/2021   ALT 13 01/27/2021   AST 15 01/27/2021   NA 137 01/27/2021   K 3.9 01/27/2021   CL 103 01/27/2021   CREATININE 0.75 01/27/2021   BUN 16 01/27/2021   CO2 27 01/27/2021   TSH 1.34 08/02/2020   INR 1.0 08/07/2019   HGBA1C 5.8 07/10/2019    VAS [place] LOWER EXTREMITY VENOUS (DVT)  Result Date: 08/31/2019  Lower Venous DVTStudy Indications: Swelling, Edema, Pain, and 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  Comparison Study: 08/20/19 - Negative RLE Venous Performing Technologist: Velva Harman Sturdivant RDMS, RVT  Examination Guidelines: A complete evaluation includes B-mode imaging, spectral Doppler, color Doppler, and power Doppler as needed of all accessible portions of each vessel. Bilateral testing is considered an integral part of a complete examination. Limited examinations for reoccurring indications may be performed as noted. The reflux portion of the exam is performed with the patient in reverse Trendelenburg.  +---------+---------------+---------+-----------+----------+--------------+  RIGHT     Compressibility Phasicity Spontaneity Properties Thrombus Aging  +---------+---------------+---------+-----------+----------+--------------+  CFV       Full            Yes       Yes                                    +---------+---------------+---------+-----------+----------+--------------+  SFJ       Full                                                             +---------+---------------+---------+-----------+----------+--------------+  FV Prox   Full                                                              +---------+---------------+---------+-----------+----------+--------------+  FV Mid    Full                                                             +---------+---------------+---------+-----------+----------+--------------+  FV Distal Full                                                             +---------+---------------+---------+-----------+----------+--------------+  PFV       Full                                                             +---------+---------------+---------+-----------+----------+--------------+  POP       Full            Yes       Yes                                    +---------+---------------+---------+-----------+----------+--------------+  PTV       Full                                                             +---------+---------------+---------+-----------+----------+--------------+  PERO      Full                                                             +---------+---------------+---------+-----------+----------+--------------+   +----+---------------+---------+-----------+----------+--------------+  LEFT Compressibility Phasicity Spontaneity Properties Thrombus Aging  +----+---------------+---------+-----------+----------+--------------+  CFV  Full            Yes       Yes                                    +----+---------------+---------+-----------+----------+--------------+  SFJ  Full                                                             +----+---------------+---------+-----------+----------+--------------+     Summary: RIGHT: - There is no evidence of deep vein thrombosis in the lower extremity.  - A complex structure with mixed echoes in the right popliteal fossa with sonographic characteristics for a rupture Baker's cyst was noted measuring 4.6 x 3.0 x 1.7 cm  LEFT: - No evidence of common femoral vein  obstruction.  *See table(s) above for measurements and observations. Electronically signed by Servando Snare MD on 08/31/2019 at 4:38:04 PM.    Final      Assessment & Plan:   Problem List Items Addressed This Visit     Essential hypertension (Chronic)    Pressure seems to be normal.  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 addiction risk, apnea etc) and complications (i.e. Somnolence, constipation and others) were explained to the patient and were aknowledged.       Relevant Medications   valACYclovir (VALTREX) 1000 MG tablet      Meds ordered this encounter  Medications   valACYclovir (VALTREX) 1000 MG tablet    Sig: Take 1 tablet (1,000 mg total) by mouth 3 (three) times daily.    Dispense:  21 tablet    Refill:  0   traMADol (ULTRAM) 50 MG tablet    Sig: Take 1 tablet (50 mg total) by mouth every 6 (six) hours as needed for up to 5 days.    Dispense:  60 tablet    Refill:  1   triamcinolone cream (KENALOG) 0.5 %    Sig: Apply 1 application. topically 4 (four) times daily.    Dispense:  90 g    Refill:  1      Follow-up: Return in about 4 weeks (around 06/02/2021) for a follow-up visit.  Walker Kehr, MD

## 2021-05-05 NOTE — Assessment & Plan Note (Signed)
Start Valtrex ?Triamc cream ?Tramadol prn ? Potential benefits of a short/long term opioids use as well as potential risks (i.e. addiction risk, apnea etc) and complications (i.e. Somnolence, constipation and others) were explained to the patient and were aknowledged. ? ?

## 2021-05-11 NOTE — Assessment & Plan Note (Signed)
Pressure seems to be normal.  Continue with current therapy ?

## 2021-05-13 ENCOUNTER — Telehealth: Payer: Self-pay

## 2021-05-13 NOTE — Telephone Encounter (Signed)
Pt finished antiviral. Pt states that she is still using the cream. Pt wonders how long she is to use the cream... Until its gone/ or the rash is gone. Pt states blisters are pretty much gone just a blue-ish color. ? ?Pt is wonder is she was suppose to receive a refill on Antiviral.  ? ?Pt states she doesn't want another outbreak so she is trying to figure out the next steps. ?

## 2021-05-14 NOTE — Telephone Encounter (Signed)
Kili can continue to use the cream if it helps with itching.  If it does not help or if there is no itching-she can stop it. ?Thanks ?

## 2021-05-15 ENCOUNTER — Encounter: Payer: Self-pay | Admitting: *Deleted

## 2021-05-19 ENCOUNTER — Ambulatory Visit (INDEPENDENT_AMBULATORY_CARE_PROVIDER_SITE_OTHER): Payer: Medicare Other

## 2021-05-19 DIAGNOSIS — Z0000 Encounter for general adult medical examination without abnormal findings: Secondary | ICD-10-CM

## 2021-05-19 NOTE — Patient Instructions (Signed)
Ms. Pfalzgraf , ?Thank you for taking time to come for your Medicare Wellness Visit. I appreciate your ongoing commitment to your health goals. Please review the following plan we discussed and let me know if I can assist you in the future.  ? ?Screening recommendations/referrals: ?Colonoscopy: discontinued ?Mammogram: 07/05/2019 ?Bone Density: 09/07/2011 ?Recommended yearly ophthalmology/optometry visit for glaucoma screening and checkup ?Recommended yearly dental visit for hygiene and checkup ? ?Vaccinations: ?Influenza vaccine: declined ?Pneumococcal vaccine: declined ?Tdap vaccine: 03/02/2012; due every 10 years ?Shingles vaccine: declined   ?Covid-19: 07/07/2019, 07/28/2019 ? ?Advanced directives: Please bring a copy of your health care power of attorney and living will to the office at your convenience. ? ?Conditions/risks identified: Yes; Client understands the importance of follow-up appointments with providers by attending scheduled visits and discussed goals to eat healthier, increase physical activity 5 times a week for 30 minutes each, exercise the brain by doing stimulating brain exercises (reading, adult coloring, crafting, listening to music, puzzles, etc.), socialize and enjoy life more, get enough sleep at least 8-9 hours average per night and make time for laughter. ? ?Next appointment: Please schedule your next Medicare Wellness Visit with your Nurse Health Advisor in 1 year by calling [phone]. ? ? ?Preventive Care 6 Years and Older, Female ?Preventive care refers to lifestyle choices and visits with your health care provider that can promote health and wellness. ?What does preventive care include? ?A yearly physical exam. This is also called an annual well check. ?Dental exams once or twice a year. ?Routine eye exams. Ask your health care provider how often you should have your eyes checked. ?Personal lifestyle choices, including: ?Daily care of your teeth and gums. ?Regular physical  activity. ?Eating a healthy diet. ?Avoiding tobacco and drug use. ?Limiting alcohol use. ?Practicing safe sex. ?Taking low-dose aspirin every day. ?Taking vitamin and mineral supplements as recommended by your health care provider. ?What happens during an annual well check? ?The services and screenings done by your health care provider during your annual well check will depend on your age, overall health, lifestyle risk factors, and family history of disease. ?Counseling  ?Your health care provider may ask you questions about your: ?Alcohol use. ?Tobacco use. ?Drug use. ?Emotional well-being. ?Home and relationship well-being. ?Sexual activity. ?Eating habits. ?History of falls. ?Memory and ability to understand (cognition). ?Work and work Statistician. ?Reproductive health. ?Screening  ?You may have the following tests or measurements: ?Height, weight, and BMI. ?Blood pressure. ?Lipid and cholesterol levels. These may be checked every 5 years, or more frequently if you are over 26 years old. ?Skin check. ?Lung cancer screening. You may have this screening every year starting at age 12 if you have a 30-pack-year history of smoking and currently smoke or have quit within the past 15 years. ?Fecal occult blood test (FOBT) of the stool. You may have this test every year starting at age 21. ?Flexible sigmoidoscopy or colonoscopy. You may have a sigmoidoscopy every 5 years or a colonoscopy every 10 years starting at age 44. ?Hepatitis C blood test. ?Hepatitis B blood test. ?Sexually transmitted disease (STD) testing. ?Diabetes screening. This is done by checking your blood sugar (glucose) after you have not eaten for a while (fasting). You may have this done every 1-3 years. ?Bone density scan. This is done to screen for osteoporosis. You may have this done starting at age 70. ?Mammogram. This may be done every 1-2 years. Talk to your health care provider about how often you should have regular mammograms. ?Talk  with your  health care provider about your test results, treatment options, and if necessary, the need for more tests. ?Vaccines  ?Your health care provider may recommend certain vaccines, such as: ?Influenza vaccine. This is recommended every year. ?Tetanus, diphtheria, and acellular pertussis (Tdap, Td) vaccine. You may need a Td booster every 10 years. ?Zoster vaccine. You may need this after age 27. ?Pneumococcal 13-valent conjugate (PCV13) vaccine. One dose is recommended after age 63. ?Pneumococcal polysaccharide (PPSV23) vaccine. One dose is recommended after age 82. ?Talk to your health care provider about which screenings and vaccines you need and how often you need them. ?This information is not intended to replace advice given to you by your health care provider. Make sure you discuss any questions you have with your health care provider. ?Document Released: 03/15/2015 Document Revised: 11/06/2015 Document Reviewed: 12/18/2014 ?Elsevier Interactive Patient Education ? 2017 [employer]. ? ?Fall Prevention in the Home ?Falls can cause injuries. They can happen to people of all ages. There are many things you can do to make your home safe and to help prevent falls. ?What can I do on the outside of my home? ?Regularly fix the edges of walkways and driveways and fix any cracks. ?Remove anything that might make you trip as you walk through a door, such as a raised step or threshold. ?Trim any bushes or trees on the path to your home. ?Use bright outdoor lighting. ?Clear any walking paths of anything that might make someone trip, such as rocks or tools. ?Regularly check to see if handrails are loose or broken. Make sure that both sides of any steps have handrails. ?Any raised decks and porches should have guardrails on the edges. ?Have any leaves, snow, or ice cleared regularly. ?Use sand or salt on walking paths during winter. ?Clean up any spills in your garage right away. This includes oil or grease spills. ?What can I  do in the bathroom? ?Use night lights. ?Install grab bars by the toilet and in the tub and shower. Do not use towel bars as grab bars. ?Use non-skid mats or decals in the tub or shower. ?If you need to sit down in the shower, use a plastic, non-slip stool. ?Keep the floor dry. Clean up any water that spills on the floor as soon as it happens. ?Remove soap buildup in the tub or shower regularly. ?Attach bath mats securely with double-sided non-slip rug tape. ?Do not have throw rugs and other things on the floor that can make you trip. ?What can I do in the bedroom? ?Use night lights. ?Make sure that you have a light by your bed that is easy to reach. ?Do not use any sheets or blankets that are too big for your bed. They should not hang down onto the floor. ?Have a firm chair that has side arms. You can use this for support while you get dressed. ?Do not have throw rugs and other things on the floor that can make you trip. ?What can I do in the kitchen? ?Clean up any spills right away. ?Avoid walking on wet floors. ?Keep items that you use a lot in easy-to-reach places. ?If you need to reach something above you, use a strong step stool that has a grab bar. ?Keep electrical cords out of the way. ?Do not use floor polish or wax that makes floors slippery. If you must use wax, use non-skid floor wax. ?Do not have throw rugs and other things on the floor that can make you  trip. ?What can I do with my stairs? ?Do not leave any items on the stairs. ?Make sure that there are handrails on both sides of the stairs and use them. Fix handrails that are broken or loose. Make sure that handrails are as long as the stairways. ?Check any carpeting to make sure that it is firmly attached to the stairs. Fix any carpet that is loose or worn. ?Avoid having throw rugs at the top or bottom of the stairs. If you do have throw rugs, attach them to the floor with carpet tape. ?Make sure that you have a light switch at the top of the stairs  and the bottom of the stairs. If you do not have them, ask someone to add them for you. ?What else can I do to help prevent falls? ?Wear shoes that: ?Do not have high heels. ?Have rubber bottoms. ?Are comfortable an

## 2021-05-19 NOTE — Progress Notes (Addendum)
?I connected with Bailey [name] today by telephone and verified that I am speaking with the correct person using two identifiers. ?Location patient: home ?Location provider: work ?Persons participating in the virtual visit: patient, provider. ?  ?I discussed the limitations, risks, security and privacy concerns of performing an evaluation and management service by telephone and the availability of in person appointments. I also discussed with the patient that there may be a patient responsible charge related to this service. The patient expressed understanding and verbally consented to this telephonic visit.  ?  ?Interactive audio and video telecommunications were attempted between this provider and patient, however failed, due to patient having technical difficulties OR patient did not have access to video capability.  We continued and completed visit with audio only. ? ?Some vital signs may be absent or patient reported.  ? ?Time Spent with patient on telephone encounter: 40 minutes ? ?Subjective:  ? Bailey [name] is a 71 y.o. female who presents for Medicare Annual (Subsequent) preventive examination. ? ?Review of Systems    ? ?Cardiac Risk Factors include: advanced age (>58mn, >>28women);family history of premature cardiovascular disease;hypertension;dyslipidemia ? ?   ?Objective:  ?  ?There were no vitals filed for this visit. ?There is no height or weight on file to calculate BMI. ? ?Advanced Directives 05/19/2021 05/02/2020 08/14/2019 08/07/2019 08/17/2016 04/19/2012  ?Does Patient Have a Medical Advance Directive? Yes Yes Yes Yes Yes Patient does not have advance directive  ?Type of Advance Directive Living will;Healthcare Power of Attorney Living will;Healthcare Power of [place] -  ?Does patient want to make changes to medical advance directive? No - Patient declined No - Patient declined  No - Patient declined - - -  ?Copy of [place]? No - copy requested No - copy requested - No - copy requested No - copy requested -  ? ? ?Current Medications (verified) ?Outpatient Encounter Medications as of 05/19/2021  ?Medication Sig  ? amLODipine-olmesartan (AZOR) 5-20 MG tablet TAKE 1 TABLET BY MOUTH EVERY DAY  ? Cholecalciferol (VITAMIN D3) 50 MCG (2000 UT) capsule Take 1 capsule (2,000 Units total) by mouth daily.  ? triamcinolone cream (KENALOG) 0.5 % Apply 1 application. topically 4 (four) times daily.  ? vitamin B-12 (V-R VITAMIN B-12) 500 MCG tablet Take 1 tablet (500 mcg total) by mouth daily.  ? valACYclovir (VALTREX) 1000 MG tablet Take 1 tablet (1,000 mg total) by mouth 3 (three) times daily. (Patient not taking: Reported on 05/19/2021)  ? ?No facility-administered encounter medications on file as of 05/19/2021.  ? ? ?Allergies (verified) ?Codeine, Hctz [hydrochlorothiazide], and Statins  ? ?History: ?Past Medical History:  ?Diagnosis Date  ? Arthritis   ? Arthritis of right knee   ? Borderline diabetes   ? Burning sensation of feet   ? Colitis   ? hx colitis-gi bleed-2006  ? Constipation   ? DYSLIPIDEMIA   ? Gallbladder problem   ? Headache   ? hx of 10 years ago  ? Hx of cardiovascular stress test   ? Lexiscan Myoview 9/14:  Small, fixed apical anterior perfusion defect (worse at rest) - probable soft tissue attenuation, EF 61%, low risk study  ? HYPERTENSION   ? Lower extremity edema   ? Partial tear of subscapularis tendon 04/22/2012  ? POSTMENOPAUSAL STATUS   ? Pre-diabetes   ? Supraspinatus tendon tear 04/22/2012  ? Tubular adenoma of colon  08/2013  ? Vitamin D deficiency   ? ?Past Surgical History:  ?Procedure Laterality Date  ? ABDOMINAL HYSTERECTOMY  1987  ? Partial  ? APPENDECTOMY  2003  ? CERVICAL FUSION  2002  ? COLONOSCOPY  06/03/04  ? isch colitis (hosp for same)  ? FRACTURE SURGERY    ? leg ? right  71 years old  ? HERNIA REPAIR  1975  ? umb  ? SHOULDER ARTHROSCOPY  WITH ROTATOR CUFF REPAIR AND SUBACROMIAL DECOMPRESSION Right 04/22/2012  ? Procedure: SHOULDER ARTHROSCOPY WITH ROTATOR CUFF REPAIR AND SUBACROMIAL DECOMPRESSION;  Surgeon: Johnny Bridge, MD;  Location: [facility];  Service: Orthopedics;  Laterality: Right;  RIGHT SHOULDER ARTHROSCOPY DEBRIDEMENT LIMITED, DECOMPRESSION SUBACROMIAL PARTIAL ACROMIOPLASTY WITH CORACOACROMIAL RELEASE, WITH ROTATOR CUFF REPAIR AND SUBSCAPULARIS REPAIR  ? TOTAL KNEE ARTHROPLASTY Right 08/14/2019  ? Procedure: TOTAL KNEE ARTHROPLASTY;  Surgeon: Elsie Saas, MD;  Location: [facility];  Service: Orthopedics;  Laterality: Right;  ? TUBAL LIGATION    ? ?Family History  ?Problem Relation Age of Onset  ? Hypertension Father   ? Stroke Father   ? Heart disease Father   ? Aneurysm Mother   ?     brain  ? Stroke Mother   ? Stroke Sister   ? Hypertension Sister   ? Diabetes Brother   ? Colon cancer Neg Hx   ? Esophageal cancer Neg Hx   ? Rectal cancer Neg Hx   ? Stomach cancer Neg Hx   ? ?Social History  ? ?Socioeconomic History  ? Marital status: Married  ?  Spouse name: Adron Bene  ? Number of children: Not on file  ? Years of education: Not on file  ? Highest education level: Not on file  ?Occupational History  ? Not on file  ?Tobacco Use  ? Smoking status: Never  ? Smokeless tobacco: Never  ? Tobacco comments:  ?  exposed to second hand (spouse smokes), Married  ?Vaping Use  ? Vaping Use: Never used  ?Substance and Sexual Activity  ? Alcohol use: No  ? Drug use: No  ? Sexual activity: Yes  ?  Birth control/protection: Surgical  ?Other Topics Concern  ? Not on file  ?Social History Narrative  ? Not on file  ? ?Social Determinants of Health  ? ?Financial Resource Strain: Low Risk   ? Difficulty of Paying Living Expenses: Not hard at all  ?Food Insecurity: No Food Insecurity  ? Worried About Charity fundraiser in the Last Year: Never true  ? Ran Out of Food in the Last Year: Never true  ?Transportation Needs: No Transportation  Needs  ? Lack of Transportation (Medical): No  ? Lack of Transportation (Non-Medical): No  ?Physical Activity: Inactive  ? Days of Exercise per Week: 0 days  ? Minutes of Exercise per Session: 0 min  ?Stress: No Stress Concern Present  ? Feeling of Stress : Not at all  ?Social Connections: Socially Integrated  ? Frequency of Communication with Friends and Family: More than three times a week  ? Frequency of Social Gatherings with Friends and Family: More than three times a week  ? Attends Religious Services: More than 4 times per year  ? Active Member of Clubs or Organizations: Yes  ? Attends Archivist Meetings: More than 4 times per year  ? Marital Status: Married  ? ? ?Tobacco Counseling ?Counseling given: Not Answered ?Tobacco comments: exposed to second hand (spouse smokes), Married ? ? ?Clinical Intake: ? ?  Pre-visit preparation completed: Yes ? ?Pain : No/denies pain ? ?  ? ?Nutritional Risks: None ?Diabetes: No ? ?How often do you need to have someone help you when you read instructions, pamphlets, or other written materials from your doctor or pharmacy?: 1 - Never ?What is the last grade level you completed in school?: HSG; with some college ? ?Diabetic? no ? ?Interpreter Needed?: No ? ?Information entered by :: Lisette Abu, LPN ? ? ?Activities of Daily Living ?In your present state of health, do you have any difficulty performing the following activities: 05/19/2021  ?Hearing? N  ?Vision? N  ?Difficulty concentrating or making decisions? N  ?Walking or climbing stairs? N  ?Dressing or bathing? N  ?Doing errands, shopping? N  ?Preparing Food and eating ? N  ?Using the Toilet? N  ?In the past six months, have you accidently leaked urine? N  ?Do you have problems with loss of bowel control? N  ?Managing your Medications? N  ?Managing your Finances? N  ?Housekeeping or managing your Housekeeping? N  ?Some recent data might be hidden  ? ? ?Patient Care Team: ?Plotnikov, Evie Lacks, MD as PCP -  General (Internal Medicine) ?Marchia Bond, MD (Orthopedic Surgery) ?Ladene Artist, MD (Gastroenterology) ?[place], [facility] ?Loney Loh, MD (Dermatology) ?Elsie Saas, MD as Erin Fulling

## 2021-05-29 DIAGNOSIS — N393 Stress incontinence (female) (male): Secondary | ICD-10-CM | POA: Insufficient documentation

## 2021-06-02 ENCOUNTER — Ambulatory Visit: Payer: Medicare Other | Admitting: Internal Medicine

## 2021-06-05 DIAGNOSIS — L661 Lichen planopilaris: Secondary | ICD-10-CM | POA: Diagnosis not present

## 2021-06-05 DIAGNOSIS — L570 Actinic keratosis: Secondary | ICD-10-CM | POA: Diagnosis not present

## 2021-06-17 ENCOUNTER — Ambulatory Visit: Payer: Medicare Other | Admitting: Internal Medicine

## 2021-07-16 ENCOUNTER — Other Ambulatory Visit: Payer: Self-pay | Admitting: Internal Medicine

## 2021-07-24 DIAGNOSIS — R8290 Unspecified abnormal findings in urine: Secondary | ICD-10-CM | POA: Diagnosis not present

## 2021-08-01 DIAGNOSIS — M62838 Other muscle spasm: Secondary | ICD-10-CM | POA: Diagnosis not present

## 2021-08-29 DIAGNOSIS — M62838 Other muscle spasm: Secondary | ICD-10-CM | POA: Diagnosis not present

## 2021-09-05 DIAGNOSIS — M62838 Other muscle spasm: Secondary | ICD-10-CM | POA: Diagnosis not present

## 2021-09-18 DIAGNOSIS — Z960 Presence of urogenital implants: Secondary | ICD-10-CM | POA: Diagnosis not present

## 2021-09-26 DIAGNOSIS — M62838 Other muscle spasm: Secondary | ICD-10-CM | POA: Diagnosis not present

## 2021-10-08 ENCOUNTER — Encounter (INDEPENDENT_AMBULATORY_CARE_PROVIDER_SITE_OTHER): Payer: Self-pay

## 2021-10-09 ENCOUNTER — Ambulatory Visit (INDEPENDENT_AMBULATORY_CARE_PROVIDER_SITE_OTHER): Payer: Medicare Other | Admitting: Internal Medicine

## 2021-10-09 ENCOUNTER — Encounter: Payer: Self-pay | Admitting: Internal Medicine

## 2021-10-09 VITALS — BP 170/100 | HR 65 | Temp 98.0°F | Ht 65.0 in | Wt 192.0 lb

## 2021-10-09 DIAGNOSIS — R739 Hyperglycemia, unspecified: Secondary | ICD-10-CM | POA: Diagnosis not present

## 2021-10-09 DIAGNOSIS — B029 Zoster without complications: Secondary | ICD-10-CM | POA: Diagnosis not present

## 2021-10-09 DIAGNOSIS — E538 Deficiency of other specified B group vitamins: Secondary | ICD-10-CM | POA: Diagnosis not present

## 2021-10-09 DIAGNOSIS — I10 Essential (primary) hypertension: Secondary | ICD-10-CM | POA: Diagnosis not present

## 2021-10-09 DIAGNOSIS — N393 Stress incontinence (female) (male): Secondary | ICD-10-CM | POA: Diagnosis not present

## 2021-10-09 LAB — COMPREHENSIVE METABOLIC PANEL
ALT: 15 U/L (ref 0–35)
AST: 16 U/L (ref 0–37)
Albumin: 4.7 g/dL (ref 3.5–5.2)
Alkaline Phosphatase: 97 U/L (ref 39–117)
BUN: 17 mg/dL (ref 6–23)
CO2: 29 mEq/L (ref 19–32)
Calcium: 10.5 mg/dL (ref 8.4–10.5)
Chloride: 102 mEq/L (ref 96–112)
Creatinine, Ser: 0.76 mg/dL (ref 0.40–1.20)
GFR: 78.98 mL/min (ref >60.00)
Glucose, Bld: 106 mg/dL — ABNORMAL HIGH (ref 70–99)
Potassium: 4.2 mEq/L (ref 3.5–5.1)
Sodium: 138 mEq/L (ref 135–145)
Total Bilirubin: 0.9 mg/dL (ref 0.2–1.2)
Total Protein: 8 g/dL (ref 6.0–8.3)

## 2021-10-09 LAB — HEMOGLOBIN A1C: Hgb A1c MFr Bld: 5.9 % (ref 4.6–6.5)

## 2021-10-09 LAB — VITAMIN B12: Vitamin B-12: 838 pg/mL (ref 211–911)

## 2021-10-09 NOTE — Progress Notes (Signed)
Subjective:  Patient ID: Bailey [name], female    DOB: Jun 29, 1950  Age: 71 y.o. MRN: [id number]  CC: No chief complaint on file.   HPI Bailey [name] presents for incontinence, stress w/David's illness, H zoster, HTN  Outpatient Medications Prior to Visit  Medication Sig Dispense Refill   amLODipine-olmesartan (AZOR) 5-20 MG tablet TAKE 1 TABLET BY MOUTH EVERY DAY 90 tablet 1   Cholecalciferol (VITAMIN D3) 50 MCG (2000 UT) capsule Take 1 capsule (2,000 Units total) by mouth daily. 100 capsule 3   triamcinolone cream (KENALOG) 0.5 % Apply 1 application. topically 4 (four) times daily. 90 g 1   vitamin B-12 (V-R VITAMIN B-12) 500 MCG tablet Take 1 tablet (500 mcg total) by mouth daily. 100 tablet 3   valACYclovir (VALTREX) 1000 MG tablet Take 1 tablet (1,000 mg total) by mouth 3 (three) times daily. (Patient not taking: Reported on 05/19/2021) 21 tablet 0   No facility-administered medications prior to visit.    ROS: Review of Systems  Constitutional:  Negative for activity change, appetite change, chills, fatigue and unexpected weight change.  HENT:  Negative for congestion, mouth sores and sinus pressure.   Eyes:  Negative for visual disturbance.  Respiratory:  Negative for cough and chest tightness.   Gastrointestinal:  Negative for abdominal pain and nausea.  Genitourinary:  Positive for frequency and urgency. Negative for difficulty urinating and vaginal pain.  Musculoskeletal:  Negative for back pain and gait problem.  Skin:  Negative for pallor and rash.  Neurological:  Negative for dizziness, tremors, weakness, numbness and headaches.  Psychiatric/Behavioral:  Negative for confusion, sleep disturbance and suicidal ideas. The patient is nervous/anxious.     Objective:  BP (!) 170/100 (BP Location: Right Arm, Patient Position: Sitting, Cuff Size: Normal)   Pulse 65   Temp 98 F (36.7 C) (Oral)   Ht '5\' 5"'$  (1.651 m)   Wt 192 lb (87.1 kg)   SpO2 97%   BMI 31.95 kg/m    BP Readings from Last 3 Encounters:  10/09/21 (!) 170/100  05/05/21 122/84  08/07/20 130/84    Wt Readings from Last 3 Encounters:  10/09/21 192 lb (87.1 kg)  05/05/21 191 lb (86.6 kg)  08/07/20 189 lb 9.6 oz (86 kg)    Physical Exam Constitutional:      General: She is not in acute distress.    Appearance: She is well-developed. She is obese.  HENT:     Head: Normocephalic.     Right Ear: External ear normal.     Left Ear: External ear normal.     Nose: Nose normal.  Eyes:     General:        Right eye: No discharge.        Left eye: No discharge.     Conjunctiva/sclera: Conjunctivae normal.     Pupils: Pupils are equal, round, and reactive to light.  Neck:     Thyroid: No thyromegaly.     Vascular: No JVD.     Trachea: No tracheal deviation.  Cardiovascular:     Rate and Rhythm: Normal rate and regular rhythm.     Heart sounds: Normal heart sounds.  Pulmonary:     Effort: No respiratory distress.     Breath sounds: No stridor. No wheezing.  Abdominal:     General: Bowel sounds are normal. There is no distension.     Palpations: Abdomen is soft. There is no mass.     Tenderness: There is  no abdominal tenderness. There is no guarding or rebound.  Musculoskeletal:        General: No tenderness.     Cervical back: Normal range of motion and neck supple. No rigidity.  Lymphadenopathy:     Cervical: No cervical adenopathy.  Skin:    Findings: No erythema or rash.  Neurological:     Cranial Nerves: No cranial nerve deficit.     Motor: No abnormal muscle tone.     Coordination: Coordination normal.     Deep Tendon Reflexes: Reflexes normal.  Psychiatric:        Behavior: Behavior normal.        Thought Content: Thought content normal.        Judgment: Judgment normal.   Skin lesions - R arm  Lab Results  Component Value Date   WBC 6.5 08/02/2020   HGB 13.8 08/02/2020   HCT 39.8 08/02/2020   PLT 238.0 08/02/2020   GLUCOSE 113 (H) 01/27/2021   CHOL 227  (H) 01/27/2021   TRIG 91.0 01/27/2021   HDL 50.90 01/27/2021   LDLDIRECT 132.0 07/10/2019   LDLCALC 158 (H) 01/27/2021   ALT 13 01/27/2021   AST 15 01/27/2021   NA 137 01/27/2021   K 3.9 01/27/2021   CL 103 01/27/2021   CREATININE 0.75 01/27/2021   BUN 16 01/27/2021   CO2 27 01/27/2021   TSH 1.34 08/02/2020   INR 1.0 08/07/2019   HGBA1C 5.8 07/10/2019    VAS [place] LOWER EXTREMITY VENOUS (DVT)  Result Date: 08/31/2019  Lower Venous DVTStudy Indications: Swelling, Edema, Pain, and 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  Comparison Study: 08/20/19 - Negative RLE Venous Performing Technologist: Velva Harman Sturdivant RDMS, RVT  Examination Guidelines: A complete evaluation includes B-mode imaging, spectral Doppler, color Doppler, and power Doppler as needed of all accessible portions of each vessel. Bilateral testing is considered an integral part of a complete examination. Limited examinations for reoccurring indications may be performed as noted. The reflux portion of the exam is performed with the patient in reverse Trendelenburg.  +---------+---------------+---------+-----------+----------+--------------+ RIGHT    CompressibilityPhasicitySpontaneityPropertiesThrombus Aging +---------+---------------+---------+-----------+----------+--------------+ CFV      Full           Yes      Yes                                 +---------+---------------+---------+-----------+----------+--------------+ SFJ      Full                                                        +---------+---------------+---------+-----------+----------+--------------+ FV Prox  Full                                                        +---------+---------------+---------+-----------+----------+--------------+ FV Mid   Full                                                        +---------+---------------+---------+-----------+----------+--------------+  FV DistalFull                                                         +---------+---------------+---------+-----------+----------+--------------+ PFV      Full                                                        +---------+---------------+---------+-----------+----------+--------------+ POP      Full           Yes      Yes                                 +---------+---------------+---------+-----------+----------+--------------+ PTV      Full                                                        +---------+---------------+---------+-----------+----------+--------------+ PERO     Full                                                        +---------+---------------+---------+-----------+----------+--------------+   +----+---------------+---------+-----------+----------+--------------+ LEFTCompressibilityPhasicitySpontaneityPropertiesThrombus Aging +----+---------------+---------+-----------+----------+--------------+ CFV Full           Yes      Yes                                 +----+---------------+---------+-----------+----------+--------------+ SFJ Full                                                        +----+---------------+---------+-----------+----------+--------------+     Summary: RIGHT: - There is no evidence of deep vein thrombosis in the lower extremity.  - A complex structure with mixed echoes in the right popliteal fossa with sonographic characteristics for a rupture Baker's cyst was noted measuring 4.6 x 3.0 x 1.7 cm  LEFT: - No evidence of common femoral vein obstruction.  *See table(s) above for measurements and observations. Electronically signed by Servando Snare MD on 08/31/2019 at 4:38:04 PM.    Final     Assessment & Plan:   Problem List Items Addressed This Visit     Essential hypertension (Chronic)     Cont on Azor to 1/d      Relevant Orders   Comprehensive metabolic panel   Hyperglycemia - Primary    Check A1c      Relevant Orders   Comprehensive metabolic panel    Hemoglobin A1c   SUI (stress urinary incontinence, female)    F/u w/Dr [place] Pink       Zoster    Resolving  Other Visit Diagnoses     Low serum vitamin B12       Relevant Orders   Vitamin B12         No orders of the defined types were placed in this encounter.     Follow-up: Return in about 6 months (around 04/11/2022) for Wellness Exam.  Walker Kehr, MD

## 2021-10-09 NOTE — Assessment & Plan Note (Signed)
Check A1c. 

## 2021-10-09 NOTE — Assessment & Plan Note (Signed)
F/u w/Dr Maryland Pink

## 2021-10-09 NOTE — Assessment & Plan Note (Addendum)
Cont on Azor to 1/d

## 2021-10-09 NOTE — Assessment & Plan Note (Signed)
Resolving

## 2021-10-14 DIAGNOSIS — S83242A Other tear of medial meniscus, current injury, left knee, initial encounter: Secondary | ICD-10-CM | POA: Diagnosis not present

## 2021-10-14 DIAGNOSIS — S83282A Other tear of lateral meniscus, current injury, left knee, initial encounter: Secondary | ICD-10-CM | POA: Diagnosis not present

## 2021-10-22 ENCOUNTER — Ambulatory Visit: Payer: Medicare Other | Admitting: Dermatology

## 2021-11-03 IMAGING — MG DIGITAL SCREENING BILAT W/ TOMO W/ CAD
6 of 10 series · 6 of 30 positions shown · non-contrast
Comparison: Previous exam(s).

CLINICAL DATA: Screening.

EXAM:
DIGITAL SCREENING BILATERAL MAMMOGRAM WITH TOMO AND CAD

[R CC synth-2D (1 of 2)]
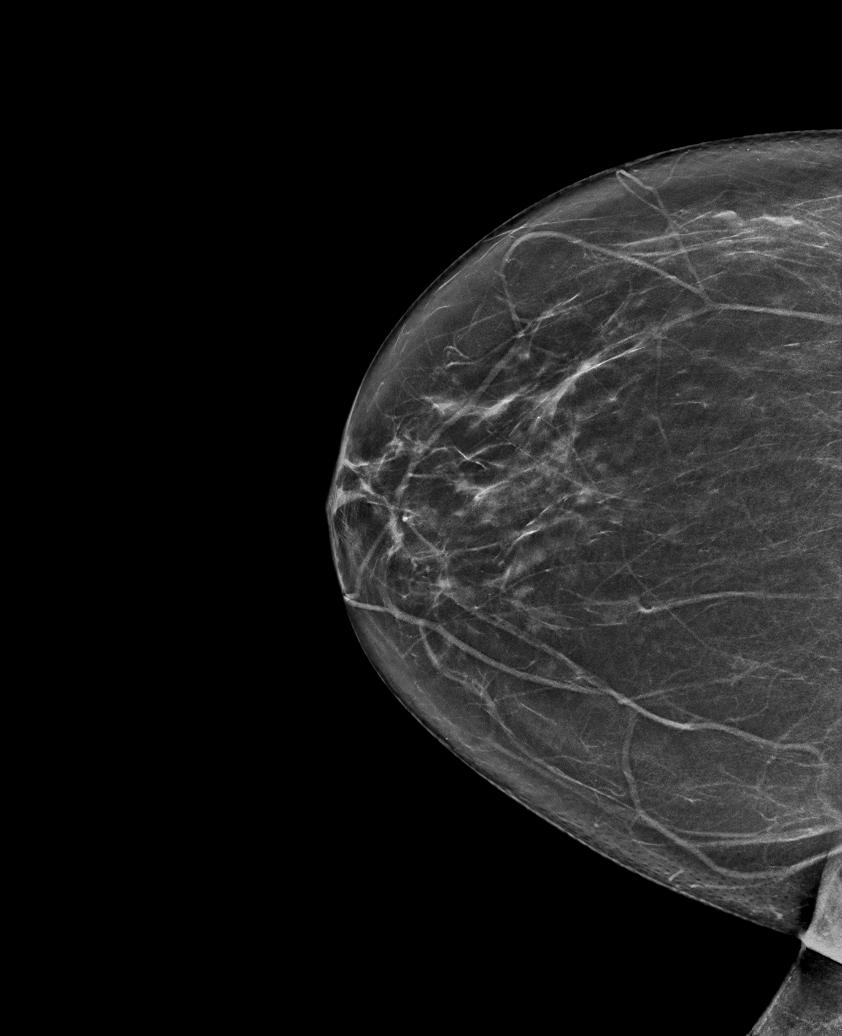

[L CC synth-2D]
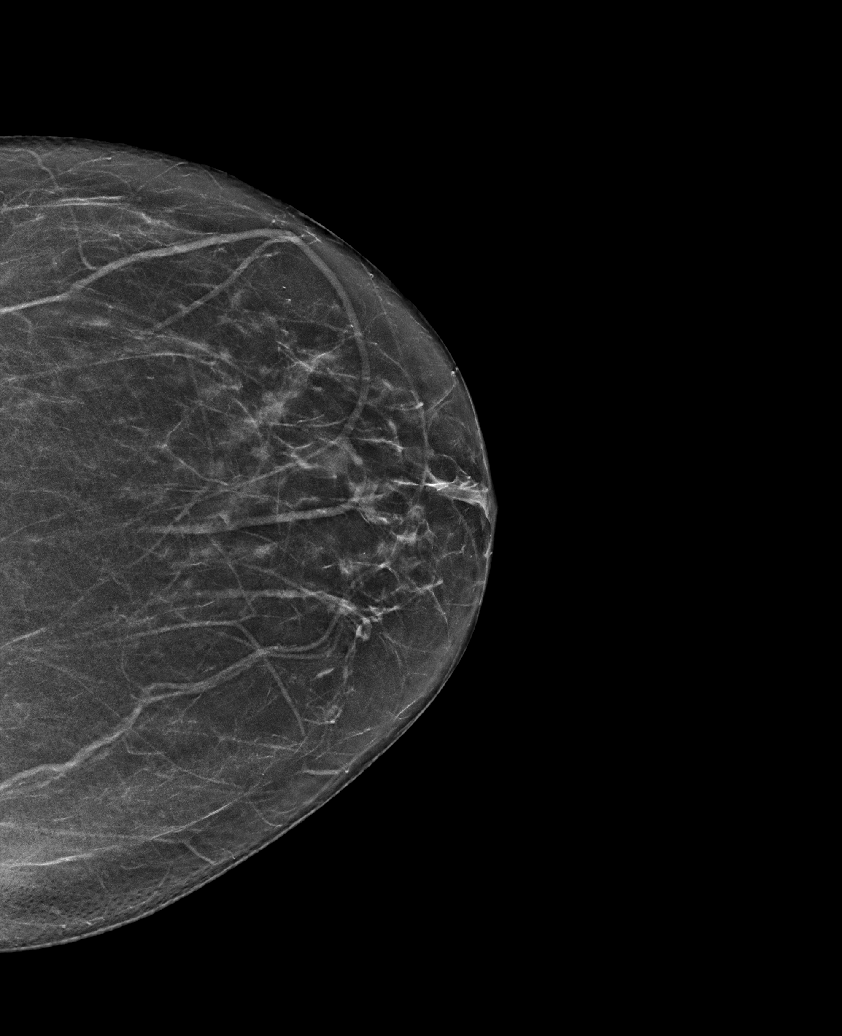

[R CC synth-2D (2 of 2)]
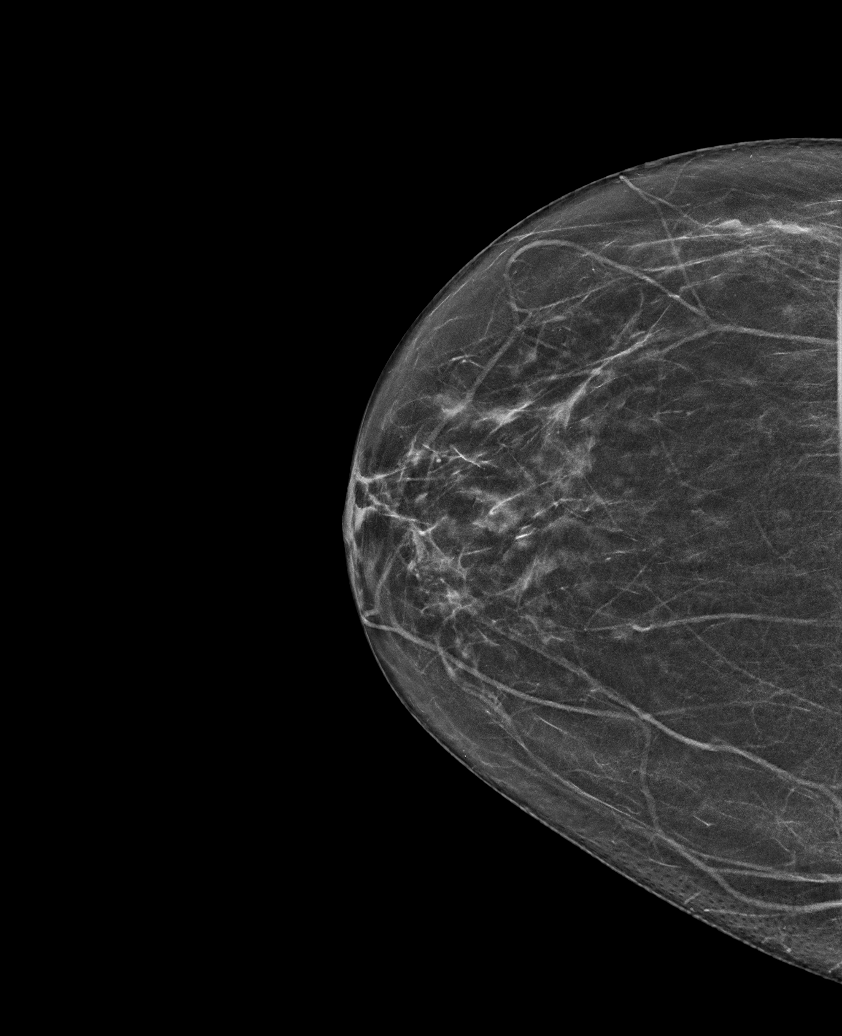

[R MLO synth-2D]
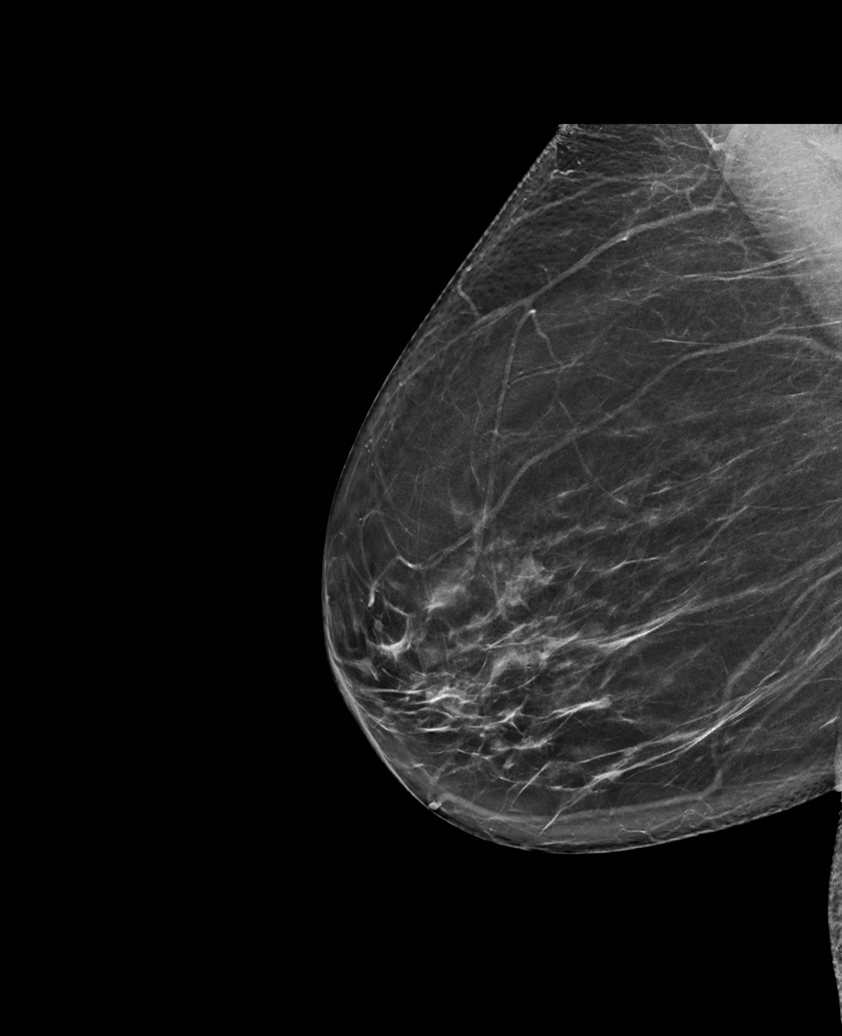

[L MLO synth-2D]
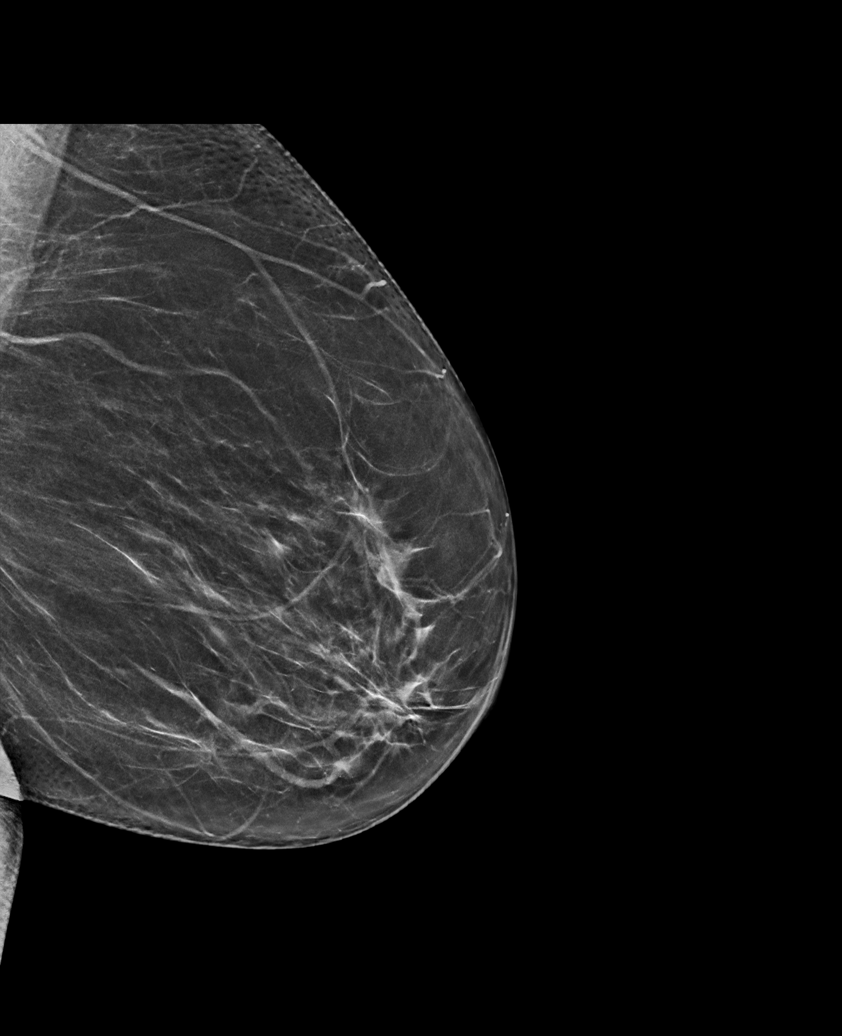

[R CC tomo · tomo slice 31/62.0]
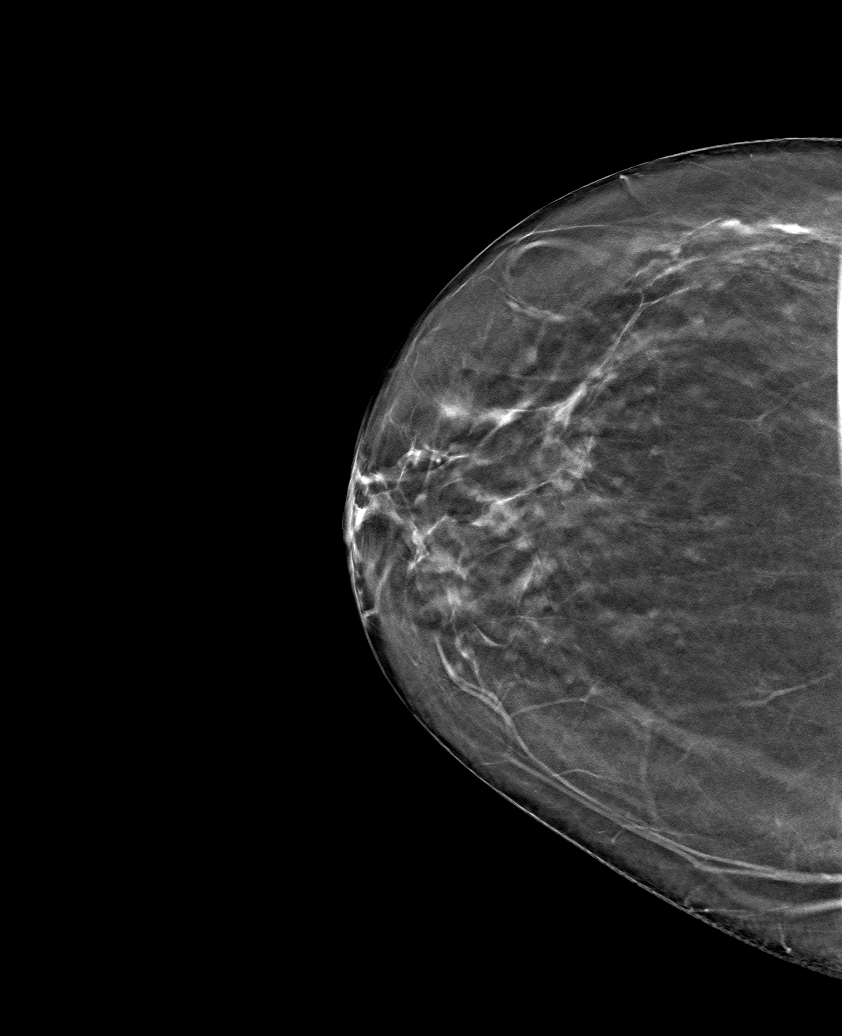

[6 of 30 positions shown; findings below may reference images not displayed]

ACR Breast Density Category b: There are scattered areas of
fibroglandular density.
FINDINGS: There are no findings suspicious for malignancy. Images were
processed with CAD.
IMPRESSION: No mammographic evidence of malignancy. A result letter of this
screening mammogram will be mailed directly to the patient.

RECOMMENDATION:
Screening mammogram in one year. (Code:[id number]-775)

BI-RADS CATEGORY  1: Negative.

## 2021-11-06 DIAGNOSIS — M6281 Muscle weakness (generalized): Secondary | ICD-10-CM | POA: Diagnosis not present

## 2021-11-06 DIAGNOSIS — M25662 Stiffness of left knee, not elsewhere classified: Secondary | ICD-10-CM | POA: Diagnosis not present

## 2021-11-06 DIAGNOSIS — M1712 Unilateral primary osteoarthritis, left knee: Secondary | ICD-10-CM | POA: Diagnosis not present

## 2021-11-12 DIAGNOSIS — M1712 Unilateral primary osteoarthritis, left knee: Secondary | ICD-10-CM | POA: Diagnosis not present

## 2021-11-12 DIAGNOSIS — M25662 Stiffness of left knee, not elsewhere classified: Secondary | ICD-10-CM | POA: Diagnosis not present

## 2021-11-12 DIAGNOSIS — M6281 Muscle weakness (generalized): Secondary | ICD-10-CM | POA: Diagnosis not present

## 2021-11-19 DIAGNOSIS — M1712 Unilateral primary osteoarthritis, left knee: Secondary | ICD-10-CM | POA: Diagnosis not present

## 2021-11-19 DIAGNOSIS — M6281 Muscle weakness (generalized): Secondary | ICD-10-CM | POA: Diagnosis not present

## 2021-11-19 DIAGNOSIS — M25662 Stiffness of left knee, not elsewhere classified: Secondary | ICD-10-CM | POA: Diagnosis not present

## 2021-11-20 DIAGNOSIS — M25662 Stiffness of left knee, not elsewhere classified: Secondary | ICD-10-CM | POA: Diagnosis not present

## 2021-11-20 DIAGNOSIS — M6281 Muscle weakness (generalized): Secondary | ICD-10-CM | POA: Diagnosis not present

## 2021-11-20 DIAGNOSIS — M1712 Unilateral primary osteoarthritis, left knee: Secondary | ICD-10-CM | POA: Diagnosis not present

## 2021-11-25 ENCOUNTER — Ambulatory Visit
Admission: RE | Admit: 2021-11-25 | Discharge: 2021-11-25 | Disposition: A | Discharge status: Home / Self Care | Payer: Medicare Other | Source: Ambulatory Visit | Attending: Internal Medicine | Admitting: Internal Medicine

## 2021-11-25 ENCOUNTER — Other Ambulatory Visit: Payer: Self-pay | Admitting: Internal Medicine

## 2021-11-25 DIAGNOSIS — Z1231 Encounter for screening mammogram for malignant neoplasm of breast: Secondary | ICD-10-CM

## 2021-11-26 DIAGNOSIS — M6281 Muscle weakness (generalized): Secondary | ICD-10-CM | POA: Diagnosis not present

## 2021-11-26 DIAGNOSIS — H43813 Vitreous degeneration, bilateral: Secondary | ICD-10-CM | POA: Diagnosis not present

## 2021-11-26 DIAGNOSIS — M25662 Stiffness of left knee, not elsewhere classified: Secondary | ICD-10-CM | POA: Diagnosis not present

## 2021-11-26 DIAGNOSIS — M1712 Unilateral primary osteoarthritis, left knee: Secondary | ICD-10-CM | POA: Diagnosis not present

## 2021-11-26 DIAGNOSIS — H2513 Age-related nuclear cataract, bilateral: Secondary | ICD-10-CM | POA: Diagnosis not present

## 2021-11-28 DIAGNOSIS — M1712 Unilateral primary osteoarthritis, left knee: Secondary | ICD-10-CM | POA: Diagnosis not present

## 2021-11-28 DIAGNOSIS — M25662 Stiffness of left knee, not elsewhere classified: Secondary | ICD-10-CM | POA: Diagnosis not present

## 2021-11-28 DIAGNOSIS — M6281 Muscle weakness (generalized): Secondary | ICD-10-CM | POA: Diagnosis not present

## 2021-12-01 DIAGNOSIS — M6281 Muscle weakness (generalized): Secondary | ICD-10-CM | POA: Diagnosis not present

## 2021-12-01 DIAGNOSIS — M1712 Unilateral primary osteoarthritis, left knee: Secondary | ICD-10-CM | POA: Diagnosis not present

## 2021-12-01 DIAGNOSIS — M25662 Stiffness of left knee, not elsewhere classified: Secondary | ICD-10-CM | POA: Diagnosis not present

## 2021-12-03 DIAGNOSIS — M6281 Muscle weakness (generalized): Secondary | ICD-10-CM | POA: Diagnosis not present

## 2021-12-03 DIAGNOSIS — M1712 Unilateral primary osteoarthritis, left knee: Secondary | ICD-10-CM | POA: Diagnosis not present

## 2021-12-03 DIAGNOSIS — M25662 Stiffness of left knee, not elsewhere classified: Secondary | ICD-10-CM | POA: Diagnosis not present

## 2021-12-08 DIAGNOSIS — M6289 Other specified disorders of muscle: Secondary | ICD-10-CM | POA: Diagnosis not present

## 2021-12-08 DIAGNOSIS — M6281 Muscle weakness (generalized): Secondary | ICD-10-CM | POA: Diagnosis not present

## 2021-12-10 DIAGNOSIS — M1712 Unilateral primary osteoarthritis, left knee: Secondary | ICD-10-CM | POA: Diagnosis not present

## 2021-12-10 DIAGNOSIS — M6281 Muscle weakness (generalized): Secondary | ICD-10-CM | POA: Diagnosis not present

## 2021-12-10 DIAGNOSIS — M25662 Stiffness of left knee, not elsewhere classified: Secondary | ICD-10-CM | POA: Diagnosis not present

## 2021-12-11 DIAGNOSIS — M25662 Stiffness of left knee, not elsewhere classified: Secondary | ICD-10-CM | POA: Diagnosis not present

## 2021-12-11 DIAGNOSIS — M1712 Unilateral primary osteoarthritis, left knee: Secondary | ICD-10-CM | POA: Diagnosis not present

## 2021-12-11 DIAGNOSIS — M6281 Muscle weakness (generalized): Secondary | ICD-10-CM | POA: Diagnosis not present

## 2021-12-15 DIAGNOSIS — R300 Dysuria: Secondary | ICD-10-CM | POA: Diagnosis not present

## 2021-12-15 DIAGNOSIS — M6289 Other specified disorders of muscle: Secondary | ICD-10-CM | POA: Diagnosis not present

## 2021-12-15 DIAGNOSIS — K5900 Constipation, unspecified: Secondary | ICD-10-CM | POA: Diagnosis not present

## 2021-12-15 DIAGNOSIS — M6281 Muscle weakness (generalized): Secondary | ICD-10-CM | POA: Diagnosis not present

## 2021-12-15 DIAGNOSIS — M62838 Other muscle spasm: Secondary | ICD-10-CM | POA: Diagnosis not present

## 2021-12-25 DIAGNOSIS — Z87440 Personal history of urinary (tract) infections: Secondary | ICD-10-CM | POA: Diagnosis not present

## 2021-12-25 DIAGNOSIS — Z960 Presence of urogenital implants: Secondary | ICD-10-CM | POA: Diagnosis not present

## 2021-12-31 DIAGNOSIS — M6281 Muscle weakness (generalized): Secondary | ICD-10-CM | POA: Diagnosis not present

## 2021-12-31 DIAGNOSIS — K5900 Constipation, unspecified: Secondary | ICD-10-CM | POA: Diagnosis not present

## 2021-12-31 DIAGNOSIS — M62838 Other muscle spasm: Secondary | ICD-10-CM | POA: Diagnosis not present

## 2021-12-31 DIAGNOSIS — M6289 Other specified disorders of muscle: Secondary | ICD-10-CM | POA: Diagnosis not present

## 2022-01-01 DIAGNOSIS — R8290 Unspecified abnormal findings in urine: Secondary | ICD-10-CM | POA: Diagnosis not present

## 2022-01-05 DIAGNOSIS — M6281 Muscle weakness (generalized): Secondary | ICD-10-CM | POA: Diagnosis not present

## 2022-01-05 DIAGNOSIS — K5900 Constipation, unspecified: Secondary | ICD-10-CM | POA: Diagnosis not present

## 2022-01-05 DIAGNOSIS — M62838 Other muscle spasm: Secondary | ICD-10-CM | POA: Diagnosis not present

## 2022-01-05 DIAGNOSIS — M6289 Other specified disorders of muscle: Secondary | ICD-10-CM | POA: Diagnosis not present

## 2022-01-08 DIAGNOSIS — L98499 Non-pressure chronic ulcer of skin of other sites with unspecified severity: Secondary | ICD-10-CM | POA: Diagnosis not present

## 2022-01-08 DIAGNOSIS — L570 Actinic keratosis: Secondary | ICD-10-CM | POA: Diagnosis not present

## 2022-01-09 DIAGNOSIS — N3020 Other chronic cystitis without hematuria: Secondary | ICD-10-CM | POA: Diagnosis not present

## 2022-01-13 DIAGNOSIS — M62838 Other muscle spasm: Secondary | ICD-10-CM | POA: Diagnosis not present

## 2022-01-13 DIAGNOSIS — M6281 Muscle weakness (generalized): Secondary | ICD-10-CM | POA: Diagnosis not present

## 2022-01-13 DIAGNOSIS — M6289 Other specified disorders of muscle: Secondary | ICD-10-CM | POA: Diagnosis not present

## 2022-01-13 DIAGNOSIS — K5900 Constipation, unspecified: Secondary | ICD-10-CM | POA: Diagnosis not present

## 2022-02-04 DIAGNOSIS — M6281 Muscle weakness (generalized): Secondary | ICD-10-CM | POA: Diagnosis not present

## 2022-02-04 DIAGNOSIS — M62838 Other muscle spasm: Secondary | ICD-10-CM | POA: Diagnosis not present

## 2022-02-04 DIAGNOSIS — N3020 Other chronic cystitis without hematuria: Secondary | ICD-10-CM | POA: Diagnosis not present

## 2022-02-04 DIAGNOSIS — K5900 Constipation, unspecified: Secondary | ICD-10-CM | POA: Diagnosis not present

## 2022-02-04 DIAGNOSIS — M6289 Other specified disorders of muscle: Secondary | ICD-10-CM | POA: Diagnosis not present

## 2022-02-11 ENCOUNTER — Other Ambulatory Visit: Payer: Self-pay | Admitting: Internal Medicine

## 2022-02-17 DIAGNOSIS — L570 Actinic keratosis: Secondary | ICD-10-CM | POA: Diagnosis not present

## 2022-02-17 DIAGNOSIS — D485 Neoplasm of uncertain behavior of skin: Secondary | ICD-10-CM | POA: Diagnosis not present

## 2022-02-17 DIAGNOSIS — L821 Other seborrheic keratosis: Secondary | ICD-10-CM | POA: Diagnosis not present

## 2022-02-17 DIAGNOSIS — L814 Other melanin hyperpigmentation: Secondary | ICD-10-CM | POA: Diagnosis not present

## 2022-02-17 DIAGNOSIS — D1801 Hemangioma of skin and subcutaneous tissue: Secondary | ICD-10-CM | POA: Diagnosis not present

## 2022-03-04 ENCOUNTER — Encounter: Payer: Self-pay | Admitting: Gastroenterology

## 2022-03-20 ENCOUNTER — Ambulatory Visit
Admission: EM | Admit: 2022-03-20 | Discharge: 2022-03-20 | Disposition: A | Discharge status: Home / Self Care | Payer: Medicare Other | Attending: Urgent Care | Admitting: Urgent Care

## 2022-03-20 DIAGNOSIS — S61255A Open bite of left ring finger without damage to nail, initial encounter: Secondary | ICD-10-CM

## 2022-03-20 DIAGNOSIS — W5381XA Bitten by other rodent, initial encounter: Secondary | ICD-10-CM

## 2022-03-20 MED ORDER — AMOXICILLIN-POT CLAVULANATE 875-125 MG PO TABS: 1.0000 | ORAL_TABLET | Freq: Two times a day (BID) | ORAL | 0 refills | Status: AC

## 2022-03-20 NOTE — ED Provider Notes (Addendum)
Roderic Palau    CSN: [id number] Arrival date & time: 03/20/22  1728      History   Chief Complaint Chief Complaint  Patient presents with   Animal Bite    HPI Bailey [name] is a 72 y.o. female.    Animal Bite   Patient presents to urgent care after sustaining a bite to her left ring finger by a mouse while cleaning her home.  She cleaned it immediately with soap and water, followed by alcohol, followed by iodine.  Past Medical History:  Diagnosis Date   Arthritis    Arthritis of right knee    Borderline diabetes    Burning sensation of feet    Colitis    hx colitis-gi bleed-2006   Constipation    DYSLIPIDEMIA    Gallbladder problem    Headache    hx of 10 years ago   Hx of cardiovascular stress test    Lexiscan Myoview 9/14:  Small, fixed apical anterior perfusion defect (worse at rest) - probable soft tissue attenuation, EF 61%, low risk study   HYPERTENSION    Lower extremity edema    Partial tear of subscapularis tendon 04/22/2012   POSTMENOPAUSAL STATUS    Pre-diabetes    Supraspinatus tendon tear 04/22/2012   Tubular adenoma of colon 08/2013   Vitamin D deficiency     Patient Active Problem List   Diagnosis Date Noted   Hyperglycemia 10/09/2021   SUI (stress urinary incontinence, female) 05/29/2021   Zoster 05/05/2021   Female genital prolapse 12/05/2020   Preop exam for internal medicine 07/18/2019   Increased urinary frequency 04/06/2019   Statin declined 02/15/2019   Statin intolerance-   muscle cramps 02/15/2019   Prediabetes 08/17/2018   Incontinence in female 07/27/2018   Actinic keratoses 09/30/2017   h/o Intolerance of drug- statin 07/14/2017   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 07/14/2017   Inactivity 07/14/2017   Dysuria 06/23/2017   Abnormal urinalysis 06/23/2017   Chronic pain of right knee 12/21/2016   Abnormality of heart beat-  08/17/2016   Acute reaction to situational stress 08/17/2016   GAD  (generalized anxiety disorder) 08/17/2016   Hematuria 03/13/2016   Pelvic pressure in female 03/13/2016   Hx of cardiovascular stress test 12/22/2015   Vitamin D deficiency 12/22/2015   Generalized OA 10/29/2014   Primary localized osteoarthritis of right knee 10/29/2014   Chronic Fatigue 07/27/2014   Chronic constipation- sees GI 07/27/2014   Rectus diastasis 08/09/2013   Obesity 06/22/2013   Gallstone 07/27/2012   Acute diverticulitis 07/27/2012   Partial tear of subscapularis tendon 04/22/2012   Supraspinatus tendon tear 04/22/2012   Trochanteric bursitis of right hip    Asymptomatic postmenopausal status 01/04/2009   Dyslipidemia 09/27/2008   Essential hypertension 06/28/2008    Past Surgical History:  Procedure Laterality Date   ABDOMINAL HYSTERECTOMY  1987   Partial   APPENDECTOMY  2003   CERVICAL FUSION  2002   COLONOSCOPY  06/03/04   isch colitis (hosp for same)   FRACTURE SURGERY     leg ? right  72 years old   [place]   umb   SHOULDER ARTHROSCOPY WITH ROTATOR CUFF REPAIR AND SUBACROMIAL DECOMPRESSION Right 04/22/2012   Procedure: SHOULDER ARTHROSCOPY WITH ROTATOR CUFF REPAIR AND SUBACROMIAL DECOMPRESSION;  Surgeon: Johnny Bridge, MD;  Location: [facility];  Service: Orthopedics;  Laterality: Right;  RIGHT SHOULDER ARTHROSCOPY DEBRIDEMENT LIMITED, DECOMPRESSION SUBACROMIAL PARTIAL ACROMIOPLASTY  WITH CORACOACROMIAL RELEASE, WITH ROTATOR CUFF REPAIR AND SUBSCAPULARIS REPAIR   TOTAL KNEE ARTHROPLASTY Right 08/14/2019   Procedure: TOTAL KNEE ARTHROPLASTY;  Surgeon: Elsie Saas, MD;  Location: [facility];  Service: Orthopedics;  Laterality: Right;   TUBAL LIGATION      OB History     Gravida  2   Para      Term      Preterm      AB      Living         SAB      IAB      Ectopic      Multiple      Live Births               Home Medications    Prior to Admission medications   Medication Sig Start Date End Date Taking?  Authorizing Provider  amLODipine-olmesartan (AZOR) 5-20 MG tablet TAKE 1 TABLET BY MOUTH EVERY DAY 02/11/22   Plotnikov, Evie Lacks, MD  Cholecalciferol (VITAMIN D3) 50 MCG (2000 UT) capsule Take 1 capsule (2,000 Units total) by mouth daily. 04/02/20   Plotnikov, Evie Lacks, MD  triamcinolone cream (KENALOG) 0.5 % Apply 1 application. topically 4 (four) times daily. 05/05/21 05/05/22  Plotnikov, Evie Lacks, MD  vitamin B-12 (V-R VITAMIN B-12) 500 MCG tablet Take 1 tablet (500 mcg total) by mouth daily. 04/02/20   Plotnikov, Evie Lacks, MD    Family History Family History  Problem Relation Age of Onset   Hypertension Father    Stroke Father    Heart disease Father    Aneurysm Mother        brain   Stroke Mother    Stroke Sister    Hypertension Sister    Diabetes Brother    Colon cancer Neg Hx    Esophageal cancer Neg Hx    Rectal cancer Neg Hx    Stomach cancer Neg Hx     Social History Social History   Tobacco Use   Smoking status: Never   Smokeless tobacco: Never   Tobacco comments:    exposed to second hand (spouse smokes), Married  Vaping Use   Vaping Use: Never used  Substance Use Topics   Alcohol use: No   Drug use: No     Allergies   Codeine, Hctz [hydrochlorothiazide], and Statins   Review of Systems Review of Systems   Physical Exam Triage Vital Signs ED Triage Vitals [03/20/22 1743]  Enc Vitals Group     BP (!) 149/85     Pulse Rate 75     Resp 16     Temp 97.7 F (36.5 C)     Temp src      SpO2 96 %     Weight      Height      Head Circumference      Peak Flow      Pain Score 0     Pain Loc      Pain Edu?      Excl. in [place]?    No data found.  Updated Vital Signs BP (!) 149/85   Pulse 75   Temp 97.7 F (36.5 C)   Resp 16   SpO2 96%   Visual Acuity Right Eye Distance:   Left Eye Distance:   Bilateral Distance:    Right Eye Near:   Left Eye Near:    Bilateral Near:     Physical Exam Vitals reviewed.  Constitutional:  Appearance:  Normal appearance.  Skin:    General: Skin is warm and dry.     Comments: Bite wound, 1 mm, distal phalanx, left hand, fourth finger.  Neurological:     General: No focal deficit present.     Mental Status: She is alert.  Psychiatric:        Mood and Affect: Mood normal.        Behavior: Behavior normal.      UC Treatments / Results  Labs (all labs ordered are listed, but only abnormal results are displayed) Labs Reviewed - No data to display  EKG   Radiology No results found.  Procedures Procedures (including critical care time)  Medications Ordered in UC Medications - No data to display  Initial Impression / Assessment and Plan / UC Course  I have reviewed the triage vital signs and the nursing notes.  Pertinent labs & imaging results that were available during my care of the patient were reviewed by me and considered in my medical decision making (see chart for details).   After consulting the CDC website, determined that patient is at very low risk for rabies and no postexposure prophylaxis is necessary.  Left message for [facility] to confirm.  Treating patient for infection prophylaxis with Augmentin.   Final Clinical Impressions(s) / UC Diagnoses   Final diagnoses:  Need for post exposure prophylaxis for rabies     Discharge Instructions      You were treated for rabies post-exposure prophylaxis today.   You received:  - Rabies immunoglobulin - Rabies vaccine (day 0)  You must return on 03/23/2022 (day 3), 03/27/2022 (day 7), 04/03/2022 (day 14) for additional doses of the vaccine.   ED Prescriptions   None    PDMP not reviewed this encounter.   Rose Phi, [place] 03/20/22 1820    Rose Phi, [place] 03/20/22 1850

## 2022-03-20 NOTE — ED Triage Notes (Signed)
Pt. Presents to UC w/ c/o a bite to her left ring finger. Pt. States she was bit my a mouse will cleaning her home.

## 2022-03-20 NOTE — Discharge Instructions (Addendum)
Follow up here or with your primary care provider if your symptoms are worsening or not improving.     

## 2022-03-26 DIAGNOSIS — K649 Unspecified hemorrhoids: Secondary | ICD-10-CM | POA: Diagnosis not present

## 2022-03-26 DIAGNOSIS — R062 Wheezing: Secondary | ICD-10-CM | POA: Diagnosis not present

## 2022-03-26 DIAGNOSIS — K5793 Diverticulitis of intestine, part unspecified, without perforation or abscess with bleeding: Secondary | ICD-10-CM | POA: Diagnosis not present

## 2022-03-26 DIAGNOSIS — K929 Disease of digestive system, unspecified: Secondary | ICD-10-CM | POA: Diagnosis not present

## 2022-03-26 DIAGNOSIS — R059 Cough, unspecified: Secondary | ICD-10-CM | POA: Diagnosis not present

## 2022-03-28 DIAGNOSIS — K5731 Diverticulosis of large intestine without perforation or abscess with bleeding: Secondary | ICD-10-CM | POA: Diagnosis not present

## 2022-03-28 DIAGNOSIS — K648 Other hemorrhoids: Secondary | ICD-10-CM | POA: Diagnosis not present

## 2022-03-28 DIAGNOSIS — I10 Essential (primary) hypertension: Secondary | ICD-10-CM | POA: Diagnosis not present

## 2022-03-28 DIAGNOSIS — U071 COVID-19: Secondary | ICD-10-CM | POA: Diagnosis not present

## 2022-03-28 DIAGNOSIS — J1282 Pneumonia due to coronavirus disease 2019: Secondary | ICD-10-CM | POA: Diagnosis not present

## 2022-03-28 DIAGNOSIS — K922 Gastrointestinal hemorrhage, unspecified: Secondary | ICD-10-CM | POA: Diagnosis not present

## 2022-03-28 DIAGNOSIS — R161 Splenomegaly, not elsewhere classified: Secondary | ICD-10-CM | POA: Diagnosis not present

## 2022-03-28 DIAGNOSIS — K625 Hemorrhage of anus and rectum: Secondary | ICD-10-CM | POA: Diagnosis not present

## 2022-03-28 DIAGNOSIS — K8020 Calculus of gallbladder without cholecystitis without obstruction: Secondary | ICD-10-CM | POA: Diagnosis not present

## 2022-03-28 DIAGNOSIS — D62 Acute posthemorrhagic anemia: Secondary | ICD-10-CM | POA: Diagnosis not present

## 2022-03-29 DIAGNOSIS — R161 Splenomegaly, not elsewhere classified: Secondary | ICD-10-CM | POA: Diagnosis not present

## 2022-04-03 ENCOUNTER — Telehealth: Payer: Self-pay | Admitting: Internal Medicine

## 2022-04-03 MED ORDER — AZITHROMYCIN 250 MG PO TABS: ORAL_TABLET | ORAL | 0 refills | Status: AC

## 2022-04-03 NOTE — Telephone Encounter (Signed)
MD ok me to sent zpac to pt pharmacy. Notified pt rx [facility] to [facility]

## 2022-04-03 NOTE — Telephone Encounter (Signed)
Patient called and would like a callback to discuss both her and her husband. Best callback number is [phone].

## 2022-04-03 NOTE — Telephone Encounter (Signed)
Called pt she states when she was in the hosp they did not give her anything to take when she tested positive of covid. She states she is still coughing w/ little chest congestion. No fever. She want to know if MD could call in zpac to her pharmacy.Marland KitchenJohny Chess

## 2022-04-06 ENCOUNTER — Encounter: Payer: Self-pay | Admitting: Internal Medicine

## 2022-04-09 ENCOUNTER — Other Ambulatory Visit (INDEPENDENT_AMBULATORY_CARE_PROVIDER_SITE_OTHER): Payer: Medicare Other

## 2022-04-09 ENCOUNTER — Other Ambulatory Visit: Payer: Self-pay | Admitting: Internal Medicine

## 2022-04-09 ENCOUNTER — Telehealth: Payer: Self-pay | Admitting: *Deleted

## 2022-04-09 DIAGNOSIS — R531 Weakness: Secondary | ICD-10-CM

## 2022-04-09 DIAGNOSIS — K922 Gastrointestinal hemorrhage, unspecified: Secondary | ICD-10-CM

## 2022-04-09 DIAGNOSIS — E538 Deficiency of other specified B group vitamins: Secondary | ICD-10-CM | POA: Diagnosis not present

## 2022-04-09 LAB — COMPREHENSIVE METABOLIC PANEL
ALT: 17 U/L (ref 0–35)
AST: 18 U/L (ref 0–37)
Albumin: 4.3 g/dL (ref 3.5–5.2)
Alkaline Phosphatase: 78 U/L (ref 39–117)
BUN: 11 mg/dL (ref 6–23)
CO2: 28 mEq/L (ref 19–32)
Calcium: 10.5 mg/dL (ref 8.4–10.5)
Chloride: 104 mEq/L (ref 96–112)
Creatinine, Ser: 0.72 mg/dL (ref 0.40–1.20)
GFR: 83.98 mL/min (ref >60.00)
Glucose, Bld: 95 mg/dL (ref 70–99)
Potassium: 4.8 mEq/L (ref 3.5–5.1)
Sodium: 138 mEq/L (ref 135–145)
Total Bilirubin: 0.6 mg/dL (ref 0.2–1.2)
Total Protein: 7.2 g/dL (ref 6.0–8.3)

## 2022-04-09 LAB — CBC WITH DIFFERENTIAL/PLATELET
Basophils Absolute: 0.1 10*3/uL (ref 0.0–0.1)
Basophils Relative: 1 % (ref 0.0–3.0)
Eosinophils Absolute: 0.1 10*3/uL (ref 0.0–0.7)
Eosinophils Relative: 1.2 % (ref 0.0–5.0)
HCT: 33.7 % — ABNORMAL LOW (ref 36.0–46.0)
Hemoglobin: 11.6 g/dL — ABNORMAL LOW (ref 12.0–15.0)
Lymphocytes Relative: 28.9 % (ref 12.0–46.0)
Lymphs Abs: 2 10*3/uL (ref 0.7–4.0)
MCHC: 34.5 g/dL (ref 30.0–36.0)
MCV: 96 fl (ref 78.0–100.0)
Monocytes Absolute: 0.5 10*3/uL (ref 0.1–1.0)
Monocytes Relative: 7.4 % (ref 3.0–12.0)
Neutro Abs: 4.3 10*3/uL (ref 1.4–7.7)
Neutrophils Relative %: 61.5 % (ref 43.0–77.0)
Platelets: 370 10*3/uL (ref 150.0–400.0)
RBC: 3.51 Mil/uL — ABNORMAL LOW (ref 3.87–5.11)
RDW: 14.2 % (ref 11.5–15.5)
WBC: 7 10*3/uL (ref 4.0–10.5)

## 2022-04-09 LAB — VITAMIN B12: Vitamin B-12: 760 pg/mL (ref 211–911)

## 2022-04-09 NOTE — Addendum Note (Signed)
Addended by: Earnstine Regal on: 04/09/2022 12:19 PM   Modules accepted: Orders

## 2022-04-09 NOTE — Telephone Encounter (Signed)
[  11:59 AM] [name], Bailey hello, Bailey [name] 1950/10/06 would like to add a Vitamin B12 to her labs she said she has been feeling weak   MD responded  Mastel ok B12. Entered order for B12.Marland KitchenJohny Chess

## 2022-04-09 NOTE — Telephone Encounter (Signed)
PT's spouse brings in the set of medical records being referenced here. Records have been placed in Dr.Plotnikov's mailbox.

## 2022-04-09 NOTE — Telephone Encounter (Signed)
Place records on MD desk to review.Marland KitchenJohny [name]

## 2022-04-10 ENCOUNTER — Other Ambulatory Visit: Payer: Medicare Other

## 2022-04-16 ENCOUNTER — Encounter: Payer: Self-pay | Admitting: Internal Medicine

## 2022-04-16 ENCOUNTER — Ambulatory Visit (INDEPENDENT_AMBULATORY_CARE_PROVIDER_SITE_OTHER): Payer: Medicare Other | Admitting: Internal Medicine

## 2022-04-16 VITALS — BP 128/86 | HR 65 | Temp 97.6°F | Ht 65.0 in | Wt 186.0 lb

## 2022-04-16 DIAGNOSIS — Z8616 Personal history of COVID-19: Secondary | ICD-10-CM | POA: Diagnosis not present

## 2022-04-16 DIAGNOSIS — Z8719 Personal history of other diseases of the digestive system: Secondary | ICD-10-CM

## 2022-04-16 DIAGNOSIS — R5383 Other fatigue: Secondary | ICD-10-CM

## 2022-04-16 NOTE — Assessment & Plan Note (Signed)
Recent Post-COVID fatigue

## 2022-04-16 NOTE — Progress Notes (Signed)
Subjective:  Patient ID: Bailey [name], female    DOB: January 21, 1951  Age: 72 y.o. MRN: [id number]  CC: Annual Exam   HPI Bailey [name] presents for post-hospital stay for lower GI bleed - diverticular bleeding... She had one iron infusion... She was COVID (+) at the hospital.  The pt took Augmentin for a mouse bite on 1/19 - each dose was followed by diarrhea.Marland KitchenMarland KitchenPt took Augmentin last in "[place], 2/6 my last day taken. Circumstances happened out of my control after our Fourth night on the cruise after dinner. After dinner, Shanon Brow and I came back to our room to later regroup with friends. Using the bathroom, I thought I had diarrhea, and when I wiped, nothing but blood and bowl was full of blood. After taking a picture, [facility].of course I was admitted from Thursday, 03/26/22, until we ported at [facility] on Saturday morning 1/ 27th, then I was taken by boat to [facility] , then ambulanced to nearest private airport, which I was put on a private medical jet w/ 2 nurses to [place] airport, then ambulance to the [facility], under the care of Dr. Madilyn Fireman. Shawn Stall, Medical Director, [facility] and also Dr. Baruch Goldmann. Armond Hang who are GI doctors. I was officially discharged on Thursday February 1".   Records reviewed  Outpatient Medications Prior to Visit  Medication Sig Dispense Refill   amLODipine-olmesartan (AZOR) 5-20 MG tablet TAKE 1 TABLET BY MOUTH EVERY DAY 90 tablet 1   Cholecalciferol (VITAMIN D3) 50 MCG (2000 UT) capsule Take 1 capsule (2,000 Units total) by mouth daily. 100 capsule 3   triamcinolone cream (KENALOG) 0.5 % Apply 1 application. topically 4 (four) times daily. 90 g 1   vitamin B-12 (V-R VITAMIN B-12) 500 MCG tablet Take 1 tablet (500 mcg total) by mouth daily. 100 tablet 3   No facility-administered medications prior to visit.    ROS: Review of Systems  Constitutional:  Positive for fatigue. Negative  for activity change, appetite change, chills and unexpected weight change.  HENT:  Negative for congestion, mouth sores and sinus pressure.   Eyes:  Negative for visual disturbance.  Respiratory:  Negative for cough and chest tightness.   Gastrointestinal:  Negative for abdominal pain, nausea and vomiting.  Genitourinary:  Negative for difficulty urinating, frequency and vaginal pain.  Musculoskeletal:  Negative for back pain and gait problem.  Skin:  Negative for pallor and rash.  Neurological:  Positive for weakness. Negative for dizziness, tremors, numbness and headaches.  Psychiatric/Behavioral:  Negative for confusion, sleep disturbance and suicidal ideas. The patient is nervous/anxious.     Objective:  BP 128/86 (BP Location: Left Arm, Patient Position: Sitting, Cuff Size: Large)   Pulse 65   Temp 97.6 F (36.4 C) (Temporal)   Ht 5' 5"$  (1.651 m)   Wt 186 lb (84.4 kg)   SpO2 98%   BMI 30.95 kg/m   BP Readings from Last 3 Encounters:  04/16/22 128/86  03/20/22 (!) 149/85  10/09/21 (!) 170/100    Wt Readings from Last 3 Encounters:  04/16/22 186 lb (84.4 kg)  03/20/22 193 lb (87.5 kg)  10/09/21 192 lb (87.1 kg)    Physical Exam Constitutional:      General: She is not in acute distress.    Appearance: She is well-developed. She is obese.  HENT:     Head: Normocephalic.     Right Ear: External ear normal.  Left Ear: External ear normal.     Nose: Nose normal.  Eyes:     General:        Right eye: No discharge.        Left eye: No discharge.     Conjunctiva/sclera: Conjunctivae normal.     Pupils: Pupils are equal, round, and reactive to light.  Neck:     Thyroid: No thyromegaly.     Vascular: No JVD.     Trachea: No tracheal deviation.  Cardiovascular:     Rate and Rhythm: Normal rate and regular rhythm.     Heart sounds: Normal heart sounds.  Pulmonary:     Effort: No respiratory distress.     Breath sounds: No stridor. No wheezing.  Abdominal:      General: Bowel sounds are normal. There is no distension.     Palpations: Abdomen is soft. There is no mass.     Tenderness: There is no abdominal tenderness. There is no guarding or rebound.  Musculoskeletal:        General: No tenderness.     Cervical back: Normal range of motion and neck supple. No rigidity.  Lymphadenopathy:     Cervical: No cervical adenopathy.  Skin:    Findings: No erythema or rash.  Neurological:     Mental Status: She is oriented to person, place, and time.     Cranial Nerves: No cranial nerve deficit.     Motor: No abnormal muscle tone.     Coordination: Coordination normal.     Deep Tendon Reflexes: Reflexes normal.  Psychiatric:        Behavior: Behavior normal.        Thought Content: Thought content normal.        Judgment: Judgment normal.      A total time of 45 minutes was spent preparing to see the patient, reviewing tests, x-rays, operative reports and other medical records.  Also, obtaining history and performing comprehensive physical exam.  Additionally, counseling the patient regarding the above listed issues.   Finally, documenting clinical information in the health records, coordination of care, educating the patient and Shanon Brow, her husband - re: GI bleed, anemia, COVID.  Lab Results  Component Value Date   WBC 7.0 04/09/2022   HGB 11.6 (L) 04/09/2022   HCT 33.7 (L) 04/09/2022   PLT 370.0 04/09/2022   GLUCOSE 95 04/09/2022   CHOL 227 (H) 01/27/2021   TRIG 91.0 01/27/2021   HDL 50.90 01/27/2021   LDLDIRECT 132.0 07/10/2019   LDLCALC 158 (H) 01/27/2021   ALT 17 04/09/2022   AST 18 04/09/2022   NA 138 04/09/2022   K 4.8 04/09/2022   CL 104 04/09/2022   CREATININE 0.72 04/09/2022   BUN 11 04/09/2022   CO2 28 04/09/2022   TSH 1.34 08/02/2020   INR 1.0 08/07/2019   HGBA1C 5.9 10/09/2021    No results found.  Assessment & Plan:   Problem List Items Addressed This Visit       Other   History of GI diverticular bleed    Recent  lower GI bleed - diverticular bleeding... She had one iron infusion... She was COVID (+) at the hospital.  The pt took Augmentin for a mouse bite on 1/19 - each dose was followed by diarrhea.Marland KitchenMarland KitchenPt took Augmentin last in [place], 2/6 my last day taken. Circumstances happened out of my control after our Fourth night on the cruise after dinner. After dinner, Shanon Brow and I came back to our  room to later regroup with friends. Using the bathroom, I thought I had diarrhea, and when I wiped, nothing but blood and bowl was full of blood. After taking a picture, went to the Infirmary.of course I was admitted from Thursday, 03/26/22, until we ported at [facility] on Saturday morning 1/ 27th, then I was taken by boat to [facility] , then ambulanced to nearest private airport, which I was put on a private medical jet w/ 2 nurses to [place]. [place] airport, then ambulance to the [facility], under the care of Dr. Madilyn Fireman. Shawn Stall, Medical Director, [facility] and also Dr. Baruch Goldmann. Armond Hang who are GI doctors. I was officially discharged on Thursday February 1".       Relevant Orders   CBC with Differential/Platelet   Comprehensive metabolic panel   History of COVID-19    Recent Post-COVID fatigue      Chronic Fatigue - Primary (Chronic)    Worse due to recent GI bleed      Relevant Orders   CBC with Differential/Platelet   Comprehensive metabolic panel      No orders of the defined types were placed in this encounter.     Follow-up: Return in about 6 weeks (around 05/28/2022) for a follow-up visit.  Walker Kehr, MD

## 2022-04-16 NOTE — Assessment & Plan Note (Addendum)
Recent lower GI bleed - diverticular bleeding... She had one iron infusion... She was COVID (+) at the hospital.  The pt took Augmentin for a mouse bite on 1/19 - each dose was followed by diarrhea.Marland KitchenMarland KitchenPt took Augmentin last in [place], 2[place]6 my last day taken. Circumstances happened out of my control after our Fourth night on the cruise after dinner. After dinner, Shanon Brow and I came back to our room to later regroup with friends. Using the bathroom, I thought I had diarrhea, and when I wiped, nothing but blood and bowl was full of blood. After taking a picture, went to the Infirmary.of course I was admitted from Thursday, 03/26/22, until we ported at [facility] on Saturday morning 1/ 27th, then I was taken by boat to [facility] , then ambulanced to nearest private airport, which I was put on a private medical jet w/ 2 nurses to [place]. [place] airport, then ambulance to the [facility], under the care of Dr. Madilyn Fireman. Shawn Stall, Medical Director, [facility] and also Dr. Baruch Goldmann. Armond Hang who are GI doctors. I was officially discharged on Thursday February 1".

## 2022-04-16 NOTE — Assessment & Plan Note (Signed)
Worse due to recent GI bleed

## 2022-04-22 ENCOUNTER — Ambulatory Visit: Payer: Medicare Other | Admitting: Gastroenterology

## 2022-04-22 ENCOUNTER — Encounter: Payer: Self-pay | Admitting: Gastroenterology

## 2022-04-22 ENCOUNTER — Telehealth: Payer: Self-pay

## 2022-04-22 VITALS — BP 180/80 | HR 65 | Ht 66.0 in | Wt 189.0 lb

## 2022-04-22 DIAGNOSIS — Z86010 Personal history of colonic polyps: Secondary | ICD-10-CM

## 2022-04-22 DIAGNOSIS — K921 Melena: Secondary | ICD-10-CM | POA: Diagnosis not present

## 2022-04-22 MED ORDER — NA SULFATE-K SULFATE-MG SULF 17.5-3.13-1.6 GM/177ML PO SOLN: 1.0000 | Freq: Once | ORAL | 0 refills | Status: AC

## 2022-04-22 NOTE — Telephone Encounter (Signed)
Pt is asking is there anything else she can do to overcome the fatigue and weakness, cough and hoarseness from the COVID/PNEUMONIA?  **Please give pt a call with provider suggestion at [phone]

## 2022-04-22 NOTE — Patient Instructions (Signed)
You have been scheduled for a colonoscopy. Please follow written instructions given to you at your visit today.  Please pick up your prep supplies at the pharmacy within the next 1-3 days. If you use inhalers (even only as needed), please bring them with you on the day of your procedure.  ______________________________________________________  If your blood pressure at your visit was 140/90 or greater, please contact your primary care physician to follow up on this.  _______________________________________________________  If you are age 65 or older, your body mass index should be between 23-30. Your Body mass index is 30.51 kg/m. If this is out of the aforementioned range listed, please consider follow up with your Primary Care Provider.  If you are age 12 or younger, your body mass index should be between 19-25. Your Body mass index is 30.51 kg/m. If this is out of the aformentioned range listed, please consider follow up with your Primary Care Provider.   ________________________________________________________  The [facility] providers would like to encourage you to use [facility] to communicate with providers for non-urgent requests or questions.  Due to long hold times on the telephone, sending your provider a message by [facility] may be a faster and more efficient way to get a response.  Please allow 48 business hours for a response.  Please remember that this is for non-urgent requests.  _______________________________________________________ Thank you for choosing me and [facility].  Pricilla Riffle. Dagoberto Ligas., MD., Marval Regal

## 2022-04-22 NOTE — Progress Notes (Signed)
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  She is accompanied by her husband.  Several days prior to leaving on a cruise she was bitten by a mouse and was treated with a 5-day course of Augmentin.  She noted more frequent formed bowel movements while on Augmentin.  On the cruise she developed painless rectal bleeding and she was transferred from the cruise to [facility] in [place].  She described multiple episodes of painless bright red blood per rectum for 2 days that stopped spontaneously prior to hospital admission. I have reviewed records from [facility].  Her bleeding stopped spontaneously and has not recurred.  Her lowest hemoglobin was 9.3.   CTAP on March 29, 2022 showed multiple small splenic lesions, the largest measuring 10 mm, with imaging characteristics c/w hemangiomas and a left adrenal nodule measuring 1.4 cm was also noted.  Her hemoglobin has improved to 11.6.  Her chronic bowel habit pattern was a bowel movement every 3 to 4 days however recently she has been having daily bowel movements.  Denies weight loss, abdominal pain, constipation, diarrhea, change in stool caliber, melena, nausea, vomiting, dysphagia, reflux symptoms, chest pain.   Colonoscopy Mar 2021  Path: TAs, SSPs  Labs / Imaging       Latest Ref Rng &  Units 04/09/2022   11:50 AM 10/09/2021    2:40 PM 01/27/2021    9:50 AM  Hepatic Function  Total Protein 6.0 - 8.3 g/dL 7.2  8.0  7.2   Albumin 3.5 - 5.2 g/dL 4.3  4.7  4.3   AST 0 - 37 U/L 18  16  15   $ ALT 0 - 35 U/L 17  15  13   $ Alk Phosphatase 39 - 117 U/L 78  97  85   Total Bilirubin 0.2 - 1.2 mg/dL 0.6  0.9  0.8        Latest Ref Rng & Units 04/09/2022   11:50 AM 08/02/2020   10:57 AM 03/29/2020   12:05 PM  CBC  WBC 4.0 - 10.5 K/uL 7.0  6.5  7.4   Hemoglobin 12.0 - 15.0 g/dL 11.6  13.8  15.6   Hematocrit 36.0 - 46.0 % 33.7  39.8  45.2   Platelets 150.0 - 400.0 K/uL 370.0  238.0  242.0    Current Medications, Allergies, Past Medical History, Past Surgical History, Family History and Social History were reviewed in [employer] record.   Physical Exam: General: Well developed, well nourished, no acute distress Head: Normocephalic and atraumatic Eyes: Sclerae anicteric, EOMI Ears: Normal auditory acuity Mouth: No deformities or lesions noted Lungs: Clear throughout to auscultation Heart: Regular rate and rhythm; No murmurs, rubs or bruits Abdomen: Soft, non tender and non distended. No masses, hepatosplenomegaly or hernias noted. Normal Bowel sounds Rectal: Deferred to colonoscopy  Musculoskeletal: Symmetrical with no gross deformities  Pulses:  Normal  pulses noted Extremities: No edema or deformities noted Neurological: Alert oriented x 4, grossly nonfocal Psychological:  Alert and cooperative. Normal mood and affect   Quinnten Calvin T. Fuller Plan, MD 04/22/2022, 10:13 AM

## 2022-04-23 NOTE — Telephone Encounter (Signed)
LM for pt to returncall

## 2022-04-23 NOTE — Telephone Encounter (Signed)
Spoke with the pt and her husband and was able to explain to them Dr. Judeen Hammans advice and recommendations. They both expressed understanding and will try what was recommended.

## 2022-04-23 NOTE — Telephone Encounter (Signed)
Try to use caffeinated drinks (coffee, tea, Diet Coke). Rest more. See me in a couple weeks if no better. Thanks

## 2022-05-04 ENCOUNTER — Encounter: Payer: Medicare Other | Admitting: Gastroenterology

## 2022-05-12 ENCOUNTER — Telehealth: Payer: Self-pay

## 2022-05-12 NOTE — Telephone Encounter (Signed)
Contacted [name] to schedule their annual wellness visit. Appointment made for 05/21/22.  .sign

## 2022-05-21 ENCOUNTER — Ambulatory Visit (INDEPENDENT_AMBULATORY_CARE_PROVIDER_SITE_OTHER): Payer: Medicare Other

## 2022-05-21 VITALS — Ht 66.0 in | Wt 188.0 lb

## 2022-05-21 DIAGNOSIS — Z0000 Encounter for general adult medical examination without abnormal findings: Secondary | ICD-10-CM

## 2022-05-21 NOTE — Patient Instructions (Addendum)
Bailey [name] , Thank you for taking time to come for your Medicare Wellness Visit. I appreciate your ongoing commitment to your health goals. Please review the following plan we discussed and let me know if I can assist you in the future.   These are the goals we discussed:  Goals       Client understands the importance of follow-up with providers by attending scheduled visits. (pt-stated)      Get  back into riding my bike and water aerobics.        This is a list of the screening recommended for you and due dates:  Health Maintenance  Topic Date Due   Hepatitis C Screening: USPSTF Recommendation to screen - Ages 21-79 yo.  Never done   Pneumonia Vaccine (1 of 1 - PCV) Never done   COVID-19 Vaccine (3 - Pfizer risk series) 08/25/2019   DTaP/Tdap/Td vaccine (3 - Td or Tdap) 03/02/2022   Colon Cancer Screening  05/29/2022   Medicare Annual Wellness Visit  05/21/2023   Mammogram  11/26/2023   DEXA scan (bone density measurement)  Completed   HPV Vaccine  Aged Out   Flu Shot  Discontinued   Zoster (Shingles) Vaccine  Discontinued    Advanced directives: Yes  Conditions/risks identified: Yes  Next appointment: Follow up in one year for your annual wellness visit.   Preventive Care 65 Years and Older, Female Preventive care refers to lifestyle choices and visits with your health care provider that can promote health and wellness. What does preventive care include? A yearly physical exam. This is also called an annual well check. Dental exams once or twice a year. Routine eye exams. Ask your health care provider how often you should have your eyes checked. Personal lifestyle choices, including: Daily care of your teeth and gums. Regular physical activity. Eating a healthy diet. Avoiding tobacco and drug use. Limiting alcohol use. Practicing safe sex. Taking low-dose aspirin every day. Taking vitamin and mineral supplements as recommended by your health care provider. What  happens during an annual well check? The services and screenings done by your health care provider during your annual well check will depend on your age, overall health, lifestyle risk factors, and family history of disease. Counseling  Your health care provider may ask you questions about your: Alcohol use. Tobacco use. Drug use. Emotional well-being. Home and relationship well-being. Sexual activity. Eating habits. History of falls. Memory and ability to understand (cognition). Work and work Statistician. Reproductive health. Screening  You may have the following tests or measurements: Height, weight, and BMI. Blood pressure. Lipid and cholesterol levels. These may be checked every 5 years, or more frequently if you are over 68 years old. Skin check. Lung cancer screening. You may have this screening every year starting at age 6 if you have a 30-pack-year history of smoking and currently smoke or have quit within the past 15 years. Fecal occult blood test (FOBT) of the stool. You may have this test every year starting at age 53. Flexible sigmoidoscopy or colonoscopy. You may have a sigmoidoscopy every 5 years or a colonoscopy every 10 years starting at age 12. Hepatitis C blood test. Hepatitis B blood test. Sexually transmitted disease (STD) testing. Diabetes screening. This is done by checking your blood sugar (glucose) after you have not eaten for a while (fasting). You may have this done every 1-3 years. Bone density scan. This is done to screen for osteoporosis. You may have this done starting at age  65. Mammogram. This may be done every 1-2 years. Talk to your health care provider about how often you should have regular mammograms. Talk with your health care provider about your test results, treatment options, and if necessary, the need for more tests. Vaccines  Your health care provider may recommend certain vaccines, such as: Influenza vaccine. This is recommended every  year. Tetanus, diphtheria, and acellular pertussis (Tdap, Td) vaccine. You may need a Td booster every 10 years. Zoster vaccine. You may need this after age 37. Pneumococcal 13-valent conjugate (PCV13) vaccine. One dose is recommended after age 76. Pneumococcal polysaccharide (PPSV23) vaccine. One dose is recommended after age 23. Talk to your health care provider about which screenings and vaccines you need and how often you need them. This information is not intended to replace advice given to you by your health care provider. Make sure you discuss any questions you have with your health care provider. Document Released: 03/15/2015 Document Revised: 11/06/2015 Document Reviewed: 12/18/2014 Elsevier Interactive Patient Education  2017 [place] Prevention in the Home Falls can cause injuries. They can happen to people of all ages. There are many things you can do to make your home safe and to help prevent falls. What can I do on the outside of my home? Regularly fix the edges of walkways and driveways and fix any cracks. Remove anything that might make you trip as you walk through a door, such as a raised step or threshold. Trim any bushes or trees on the path to your home. Use bright outdoor lighting. Clear any walking paths of anything that might make someone trip, such as rocks or tools. Regularly check to see if handrails are loose or broken. Make sure that both sides of any steps have handrails. Any raised decks and porches should have guardrails on the edges. Have any leaves, snow, or ice cleared regularly. Use sand or salt on walking paths during winter. Clean up any spills in your garage right away. This includes oil or grease spills. What can I do in the bathroom? Use night lights. Install grab bars by the toilet and in the tub and shower. Do not use towel bars as grab bars. Use non-skid mats or decals in the tub or shower. If you need to sit down in the shower, use a  plastic, non-slip stool. Keep the floor dry. Clean up any water that spills on the floor as soon as it happens. Remove soap buildup in the tub or shower regularly. Attach bath mats securely with double-sided non-slip rug tape. Do not have throw rugs and other things on the floor that can make you trip. What can I do in the bedroom? Use night lights. Make sure that you have a light by your bed that is easy to reach. Do not use any sheets or blankets that are too big for your bed. They should not hang down onto the floor. Have a firm chair that has side arms. You can use this for support while you get dressed. Do not have throw rugs and other things on the floor that can make you trip. What can I do in the kitchen? Clean up any spills right away. Avoid walking on wet floors. Keep items that you use a lot in easy-to-reach places. If you need to reach something above you, use a strong step stool that has a grab bar. Keep electrical cords out of the way. Do not use floor polish or wax that makes floors slippery.  If you must use wax, use non-skid floor wax. Do not have throw rugs and other things on the floor that can make you trip. What can I do with my stairs? Do not leave any items on the stairs. Make sure that there are handrails on both sides of the stairs and use them. Fix handrails that are broken or loose. Make sure that handrails are as long as the stairways. Check any carpeting to make sure that it is firmly attached to the stairs. Fix any carpet that is loose or worn. Avoid having throw rugs at the top or bottom of the stairs. If you do have throw rugs, attach them to the floor with carpet tape. Make sure that you have a light switch at the top of the stairs and the bottom of the stairs. If you do not have them, ask someone to add them for you. What else can I do to help prevent falls? Wear shoes that: Do not have high heels. Have rubber bottoms. Are comfortable and fit you  well. Are closed at the toe. Do not wear sandals. If you use a stepladder: Make sure that it is fully opened. Do not climb a closed stepladder. Make sure that both sides of the stepladder are locked into place. Ask someone to hold it for you, if possible. Clearly mark and make sure that you can see: Any grab bars or handrails. First and last steps. Where the edge of each step is. Use tools that help you move around (mobility aids) if they are needed. These include: Canes. Walkers. Scooters. Crutches. Turn on the lights when you go into a dark area. Replace any light bulbs as soon as they burn out. Set up your furniture so you have a clear path. Avoid moving your furniture around. If any of your floors are uneven, fix them. If there are any pets around you, be aware of where they are. Review your medicines with your doctor. Some medicines can make you feel dizzy. This can increase your chance of falling. Ask your doctor what other things that you can do to help prevent falls. This information is not intended to replace advice given to you by your health care provider. Make sure you discuss any questions you have with your health care provider. Document Released: 12/13/2008 Document Revised: 07/25/2015 Document Reviewed: 03/23/2014 Elsevier Interactive Patient Education  2017 [employer].

## 2022-05-21 NOTE — Progress Notes (Addendum)
I connected with  [name] on 05/21/22 by a audio enabled telemedicine application and verified that I am speaking with the correct person using two identifiers.  Patient Location: Home  Provider Location: Office/Clinic  I discussed the limitations of evaluation and management by telemedicine. The patient expressed understanding and agreed to proceed.  Patient Medicare AWV questionnaire was completed by the patient on 05/20/2022; I have confirmed that all information answered by patient is correct and no changes since this date.    Subjective:   Bailey [name] is a 72 y.o. female who presents for Medicare Annual (Subsequent) preventive examination.  Review of Systems     Cardiac Risk Factors include: advanced age (>79men, >51 women);hypertension;family history of premature cardiovascular disease;dyslipidemia;obesity (BMI >30kg/m2)     Objective:    Today's Vitals   05/21/22 0849 05/21/22 0851  Weight: 188 lb (85.3 kg)   Height: 5\' 6"  (1.676 m)   PainSc: 0-No pain 0-No pain   Body mass index is 30.34 kg/m.     05/21/2022    8:52 AM 05/19/2021   11:08 AM 05/02/2020    1:30 PM 08/14/2019   12:00 PM 08/07/2019   10:26 AM 08/17/2016   10:37 AM 04/19/2012    2:45 PM  Advanced Directives  Does Patient Have a Medical Advance Directive? Yes Yes Yes Yes Yes Yes Patient does not have advance directive  Type of Advance Directive [place];Living will Living will;Healthcare Power of Attorney Living will;Healthcare Power of [place];Living will [place];Living will [place];Living will   Does patient want to make changes to medical advance directive?  No - Patient declined No - Patient declined No - Patient declined     Copy of [place] in Chart? No - copy requested No - copy requested No - copy requested  No - copy requested No - copy requested     Current Medications  (verified) Outpatient Encounter Medications as of 05/21/2022  Medication Sig   amLODipine-olmesartan (AZOR) 5-20 MG tablet TAKE 1 TABLET BY MOUTH EVERY DAY   Cholecalciferol (VITAMIN D3) 50 MCG (2000 UT) capsule Take 1 capsule (2,000 Units total) by mouth daily.   vitamin B-12 (V-R VITAMIN B-12) 500 MCG tablet Take 1 tablet (500 mcg total) by mouth daily.   No facility-administered encounter medications on file as of 05/21/2022.    Allergies (verified) Codeine, Hctz [hydrochlorothiazide], and Statins   History: Past Medical History:  Diagnosis Date   Arthritis    Arthritis of right knee    Borderline diabetes    Burning sensation of feet    Colitis    hx colitis-gi bleed-2006   Constipation    DYSLIPIDEMIA    Gallbladder problem    Headache    hx of 10 years ago   Hx of cardiovascular stress test    Lexiscan Myoview 9/14:  Small, fixed apical anterior perfusion defect (worse at rest) - probable soft tissue attenuation, EF 61%, low risk study   HYPERTENSION    Lower extremity edema    Partial tear of subscapularis tendon 04/22/2012   POSTMENOPAUSAL STATUS    Pre-diabetes    Supraspinatus tendon tear 04/22/2012   Tubular adenoma of colon 08/2013   Vitamin D deficiency    Past Surgical History:  Procedure Laterality Date   ABDOMINAL HYSTERECTOMY  1987   Partial   APPENDECTOMY  2003   CERVICAL FUSION  2002   COLONOSCOPY  06/03/04  isch colitis (hosp for same)   FRACTURE SURGERY     leg ? right  72 years old   [place]   umb   SHOULDER ARTHROSCOPY WITH ROTATOR CUFF REPAIR AND SUBACROMIAL DECOMPRESSION Right 04/22/2012   Procedure: SHOULDER ARTHROSCOPY WITH ROTATOR CUFF REPAIR AND SUBACROMIAL DECOMPRESSION;  Surgeon: Johnny Bridge, MD;  Location: [place];  Service: Orthopedics;  Laterality: Right;  RIGHT SHOULDER ARTHROSCOPY DEBRIDEMENT LIMITED, DECOMPRESSION SUBACROMIAL PARTIAL ACROMIOPLASTY WITH CORACOACROMIAL RELEASE, WITH ROTATOR CUFF REPAIR  AND SUBSCAPULARIS REPAIR   TOTAL KNEE ARTHROPLASTY Right 08/14/2019   Procedure: TOTAL KNEE ARTHROPLASTY;  Surgeon: Elsie Saas, MD;  Location: [facility];  Service: Orthopedics;  Laterality: Right;   TUBAL LIGATION     Family History  Problem Relation Age of Onset   Hypertension Father    Stroke Father    Heart disease Father    Aneurysm Mother        brain   Stroke Mother    Stroke Sister    Hypertension Sister    Diabetes Brother    Colon cancer Neg Hx    Esophageal cancer Neg Hx    Rectal cancer Neg Hx    Stomach cancer Neg Hx    Social History   Socioeconomic History   Marital status: Married    Spouse name: Adron Bene   Number of children: Not on file   Years of education: Not on file   Highest education level: Not on file  Occupational History   Not on file  Tobacco Use   Smoking status: Never   Smokeless tobacco: Never   Tobacco comments:    exposed to second hand (spouse smokes), Married  Vaping Use   Vaping Use: Never used  Substance and Sexual Activity   Alcohol use: No   Drug use: No   Sexual activity: Yes    Birth control/protection: Surgical  Other Topics Concern   Not on file  Social History Narrative   Not on file   Social Determinants of Health   Financial Resource Strain: Low Risk  (05/21/2022)   Overall Financial Resource Strain (CARDIA)    Difficulty of Paying Living Expenses: Not hard at all  Food Insecurity: No Food Insecurity (05/21/2022)   Hunger Vital Sign    Worried About Running Out of Food in the Last Year: Never true    Ran Out of Food in the Last Year: Never true  Transportation Needs: No Transportation Needs (05/21/2022)   PRAPARE - Hydrologist (Medical): No    Lack of Transportation (Non-Medical): No  Physical Activity: Insufficiently Active (05/21/2022)   Exercise Vital Sign    Days of Exercise per Week: 3 days    Minutes of Exercise per Session: 30 min  Stress: No Stress Concern Present  (05/21/2022)   [place]    Feeling of Stress : Not at all  Social Connections: Unknown (05/21/2022)   Social Connection and Isolation Panel [NHANES]    Frequency of Communication with Friends and Family: More than three times a week    Frequency of Social Gatherings with Friends and Family: Twice a week    Attends Religious Services: Not on Advertising copywriter or Organizations: No    Attends Archivist Meetings: Patient declined    Marital Status: Married    Tobacco Counseling Counseling given: Not Answered Tobacco comments: exposed to second hand (  spouse smokes), Married   Clinical Intake:  Pre-visit preparation completed: Yes  Pain : No/denies pain Pain Score: 0-No pain     BMI - recorded: 30.34 Nutritional Status: BMI > 30  Obese Nutritional Risks: None Diabetes: No  How often do you need to have someone help you when you read instructions, pamphlets, or other written materials from your doctor or pharmacy?: 1 - Never What is the last grade level you completed in school?: HSG  Diabetic? No  Interpreter Needed?: No  Information entered by :: Lisette Abu, LPN.   Activities of Daily Living    05/21/2022    8:58 AM 05/20/2022   10:24 PM  In your present state of health, do you have any difficulty performing the following activities:  Hearing? 0 0  Vision? 0 0  Difficulty concentrating or making decisions? 0 0  Walking or climbing stairs? 0 0  Dressing or bathing? 0 0  Doing errands, shopping? 0 0  Preparing Food and eating ? N N  Using the Toilet? N N  In the past six months, have you accidently leaked urine? Y Y  Do you have problems with loss of bowel control? N N  Managing your Medications? N N  Managing your Finances? N N  Housekeeping or managing your Housekeeping? N N    Patient Care Team: Plotnikov, Evie Lacks, MD as PCP - General (Internal Medicine) Marchia Bond, MD (Orthopedic Surgery) Ladene Artist, MD (Gastroenterology) [facility] McMichael, Flonnie Hailstone, MD (Dermatology) Elsie Saas, MD as Consulting Physician (Orthopedic Surgery) Marygrace Drought, MD as Consulting Physician (Ophthalmology) Marti Sleigh, MD as Referring Physician (Gynecology)  Indicate any recent Medical Services you may have received from other than [facility] providers in the past year (date may be approximate).     Assessment:   This is a routine wellness examination for [place].  Hearing/Vision screen Hearing Screening - Comments:: Denies hearing difficulties   Vision Screening - Comments:: Wears rx glasses - up to date with routine eye exams with Marygrace Drought, MD.   Dietary issues and exercise activities discussed: Current Exercise Habits: Home exercise routine, Type of exercise: walking;Other - see comments (leg exercises), Time (Minutes): 30, Frequency (Times/Week): 3, Weekly Exercise (Minutes/Week): 90, Intensity: Moderate, Exercise limited by: orthopedic condition(s)   Goals Addressed               This Visit's Progress     Client understands the importance of follow-up with providers by attending scheduled visits. (pt-stated)        Get  back into riding my bike and water aerobics.      Depression Screen    05/21/2022    8:57 AM 05/19/2021   11:07 AM 05/05/2021    2:40 PM 05/02/2020    1:52 PM 04/27/2019   11:14 AM 04/06/2019    1:24 PM 02/15/2019    1:35 PM  PHQ 2/9 Scores  PHQ - 2 Score 0 0 0 0 0 0 0  PHQ- 9 Score 0    1 0 0    Fall Risk    05/21/2022    8:53 AM 05/20/2022   10:24 PM 05/05/2021    2:40 PM 05/02/2020    1:35 PM 10/23/2019    4:03 PM  [place] in the past year? 0 0 0 0 0  Number falls in past yr: 0  0 0 0  Injury with Fall? 0  0 0 0  Risk for  fall due to : No Fall Risks   No Fall Risks No Fall Risks  Follow up Falls prevention discussed   Falls evaluation completed Falls evaluation completed     Sun:  Any stairs in or around the home? Yes  If so, are there any without handrails? No  Home free of loose throw rugs in walkways, pet beds, electrical cords, etc? Yes  Adequate lighting in your home to reduce risk of falls? Yes   ASSISTIVE DEVICES UTILIZED TO PREVENT FALLS:  Life alert? No  Use of a cane, walker or w/c? No  Grab bars in the bathroom? Yes  Shower chair or bench in shower? Yes  Elevated toilet seat or a handicapped toilet? No   TIMED UP AND GO:  Was the test performed? No . Telephonic Visit   Cognitive Function:        05/21/2022    9:17 AM 08/17/2016   11:46 AM  6CIT Screen  What Year? 0 points 0 points  What month? 0 points 0 points  What time? 0 points 0 points  Count back from 20 0 points 0 points  Months in reverse 0 points 0 points  Repeat phrase 0 points 4 points  Total Score 0 points 4 points    Immunizations Immunization History  Administered Date(s) Administered   PFIZER(Purple Top)SARS-COV-2 Vaccination 07/07/2019, 07/28/2019   Td 01/04/2009   Tdap 03/02/2012    TDAP status: Due, Education has been provided regarding the importance of this vaccine. Advised may receive this vaccine at local pharmacy or Health Dept. Aware to provide a copy of the vaccination record if obtained from local pharmacy or Health Dept. Verbalized acceptance and understanding.  Flu Vaccine status: Declined, Education has been provided regarding the importance of this vaccine but patient still declined. Advised may receive this vaccine at local pharmacy or Health Dept. Aware to provide a copy of the vaccination record if obtained from local pharmacy or Health Dept. Verbalized acceptance and understanding.  Pneumococcal vaccine status: Declined,  Education has been provided regarding the importance of this vaccine but patient still declined. Advised may receive this vaccine at local pharmacy or Health Dept. Aware to provide a  copy of the vaccination record if obtained from local pharmacy or Health Dept. Verbalized acceptance and understanding.   Covid-19 vaccine status: Completed vaccines  Qualifies for Shingles Vaccine? Yes   Zostavax completed No   Shingrix Completed?: No.    Education has been provided regarding the importance of this vaccine. Patient has been advised to call insurance company to determine out of pocket expense if they have not yet received this vaccine. Advised may also receive vaccine at local pharmacy or Health Dept. Verbalized acceptance and understanding.  Screening Tests Health Maintenance  Topic Date Due   Hepatitis C Screening  Never done   Pneumonia Vaccine 16+ Years old (1 of 1 - PCV) Never done   COVID-19 Vaccine (3 - Pfizer risk series) 08/25/2019   DTaP/Tdap/Td (3 - Td or Tdap) 03/02/2022   COLONOSCOPY (Pts 45-57yrs Insurance coverage will need to be confirmed)  05/29/2022   Medicare Annual Wellness (AWV)  05/21/2023   MAMMOGRAM  11/26/2023   DEXA SCAN  Completed   HPV VACCINES  Aged Out   INFLUENZA VACCINE  Discontinued   Zoster Vaccines- Shingrix  Discontinued    Health Maintenance  Health Maintenance Due  Topic Date Due   Hepatitis C Screening  Never done   Pneumonia Vaccine  38+ Years old (1 of 1 - PCV) Never done   COVID-19 Vaccine (3 - Pfizer risk series) 08/25/2019   DTaP/Tdap/Td (3 - Td or Tdap) 03/02/2022    Colorectal cancer screening: Type of screening: Colonoscopy. Completed 05/29/2019. Repeat every 3 years Scheduled for 06/11/2022.  Mammogram status: Completed 11/25/2021. Repeat every year  Bone Density status: No longer recommended.  Lung Cancer Screening: (Low Dose CT Chest recommended if Age 2-80 years, 30 pack-year currently smoking OR have quit w/in 15years.) does not qualify.   Lung Cancer Screening Referral: no  Additional Screening:  Hepatitis C Screening: does qualify; Completed No  Vision Screening: Recommended annual ophthalmology exams  for early detection of glaucoma and other disorders of the eye. Is the patient up to date with their annual eye exam?  Yes  Who is the provider or what is the name of the office in which the patient attends annual eye exams? Marygrace Drought, MD. If pt is not established with a provider, would they like to be referred to a provider to establish care? No .   Dental Screening: Recommended annual dental exams for proper oral hygiene  Community Resource Referral / Chronic Care Management: CRR required this visit?  No   CCM required this visit?  No      Plan:     I have personally reviewed and noted the following in the patient's chart:   Medical and social history Use of alcohol, tobacco or illicit drugs  Current medications and supplements including opioid prescriptions. Patient is not currently taking opioid prescriptions. Functional ability and status Nutritional status Physical activity Advanced directives List of other physicians Hospitalizations, surgeries, and ER visits in previous 12 months Vitals Screenings to include cognitive, depression, and falls Referrals and appointments  In addition, I have reviewed and discussed with patient certain preventive protocols, quality metrics, and best practice recommendations. A written personalized care plan for preventive services as well as general preventive health recommendations were provided to patient.     Sheral Flow, LPN   [id number]   Nurse Notes:   Patient Medicare AWV questionnaire was completed by the patient on 05/20/2022; I have confirmed that all information answered by patient is correct and no changes since this date.    Normal cognitive status assessed by direct observation by this Nurse Health Advisor. No abnormalities found.      Medical screening examination/treatment/procedure(s) were performed by non-physician practitioner and as supervising physician I was immediately available for  consultation/collaboration.  I agree with above. Lew Dawes, MD

## 2022-05-25 ENCOUNTER — Other Ambulatory Visit (INDEPENDENT_AMBULATORY_CARE_PROVIDER_SITE_OTHER): Payer: Medicare Other

## 2022-05-25 DIAGNOSIS — Z8719 Personal history of other diseases of the digestive system: Secondary | ICD-10-CM

## 2022-05-25 DIAGNOSIS — R5383 Other fatigue: Secondary | ICD-10-CM

## 2022-05-25 LAB — COMPREHENSIVE METABOLIC PANEL
ALT: 14 U/L (ref 0–35)
AST: 17 U/L (ref 0–37)
Albumin: 4.5 g/dL (ref 3.5–5.2)
Alkaline Phosphatase: 91 U/L (ref 39–117)
BUN: 11 mg/dL (ref 6–23)
CO2: 27 mEq/L (ref 19–32)
Calcium: 10.4 mg/dL (ref 8.4–10.5)
Chloride: 103 mEq/L (ref 96–112)
Creatinine, Ser: 0.68 mg/dL (ref 0.40–1.20)
GFR: 87.4 mL/min (ref >60.00)
Glucose, Bld: 114 mg/dL — ABNORMAL HIGH (ref 70–99)
Potassium: 4.1 mEq/L (ref 3.5–5.1)
Sodium: 138 mEq/L (ref 135–145)
Total Bilirubin: 0.5 mg/dL (ref 0.2–1.2)
Total Protein: 7.8 g/dL (ref 6.0–8.3)

## 2022-05-25 LAB — CBC WITH DIFFERENTIAL/PLATELET
Basophils Absolute: 0 10*3/uL (ref 0.0–0.1)
Basophils Relative: 0.6 % (ref 0.0–3.0)
Eosinophils Absolute: 0.1 10*3/uL (ref 0.0–0.7)
Eosinophils Relative: 1 % (ref 0.0–5.0)
HCT: 42.1 % (ref 36.0–46.0)
Hemoglobin: 14.4 g/dL (ref 12.0–15.0)
Lymphocytes Relative: 21 % (ref 12.0–46.0)
Lymphs Abs: 1.4 10*3/uL (ref 0.7–4.0)
MCHC: 34.1 g/dL (ref 30.0–36.0)
MCV: 93.6 fl (ref 78.0–100.0)
Monocytes Absolute: 0.4 10*3/uL (ref 0.1–1.0)
Monocytes Relative: 6.4 % (ref 3.0–12.0)
Neutro Abs: 4.8 10*3/uL (ref 1.4–7.7)
Neutrophils Relative %: 71 % (ref 43.0–77.0)
Platelets: 247 10*3/uL (ref 150.0–400.0)
RBC: 4.5 Mil/uL (ref 3.87–5.11)
RDW: 13.3 % (ref 11.5–15.5)
WBC: 6.8 10*3/uL (ref 4.0–10.5)

## 2022-05-28 ENCOUNTER — Ambulatory Visit (INDEPENDENT_AMBULATORY_CARE_PROVIDER_SITE_OTHER): Payer: Medicare Other | Admitting: Internal Medicine

## 2022-05-28 VITALS — BP 130/82 | HR 59 | Temp 98.7°F | Ht 66.0 in | Wt 194.0 lb

## 2022-05-28 DIAGNOSIS — I10 Essential (primary) hypertension: Secondary | ICD-10-CM

## 2022-05-28 DIAGNOSIS — R739 Hyperglycemia, unspecified: Secondary | ICD-10-CM | POA: Diagnosis not present

## 2022-05-28 DIAGNOSIS — R5383 Other fatigue: Secondary | ICD-10-CM | POA: Diagnosis not present

## 2022-05-28 DIAGNOSIS — R7303 Prediabetes: Secondary | ICD-10-CM

## 2022-05-28 DIAGNOSIS — E559 Vitamin D deficiency, unspecified: Secondary | ICD-10-CM | POA: Diagnosis not present

## 2022-05-28 MED ORDER — AMLODIPINE-OLMESARTAN 5-20 MG PO TABS: 1.0000 | ORAL_TABLET | Freq: Every day | ORAL | 3 refills | Status: DC

## 2022-05-28 NOTE — Assessment & Plan Note (Signed)
Post-COVID 

## 2022-05-28 NOTE — Assessment & Plan Note (Signed)
Monitor A1c 

## 2022-05-28 NOTE — Progress Notes (Signed)
Subjective:  Patient ID: Bailey [name], female    DOB: 05/10/1950  Age: 72 y.o. MRN: [id number]  CC: Follow-up (6 week f/u)   HPI Bailey [name] presents for post-COVID fatigue, B12 def, OAB F/u GI bleed - no relapse   Outpatient Medications Prior to Visit  Medication Sig Dispense Refill  . phenazopyridine (PYRIDIUM) 95 MG tablet Take by mouth.    . Cholecalciferol (VITAMIN D3) 50 MCG (2000 UT) capsule Take 1 capsule (2,000 Units total) by mouth daily. 100 capsule 3  . vitamin B-12 (V-R VITAMIN B-12) 500 MCG tablet Take 1 tablet (500 mcg total) by mouth daily. 100 tablet 3  . amLODipine-olmesartan (AZOR) 5-20 MG tablet TAKE 1 TABLET BY MOUTH EVERY DAY 90 tablet 1   No facility-administered medications prior to visit.    ROS: Review of Systems  Constitutional:  Positive for chills and fatigue. Negative for activity change, appetite change and unexpected weight change.  HENT:  Negative for congestion, mouth sores and sinus pressure.   Eyes:  Negative for visual disturbance.  Respiratory:  Negative for cough and chest tightness.   Gastrointestinal:  Negative for abdominal pain and nausea.  Genitourinary:  Negative for difficulty urinating, frequency and vaginal pain.  Musculoskeletal:  Negative for back pain and gait problem.  Skin:  Negative for pallor and rash.  Neurological:  Negative for dizziness, tremors, weakness, numbness and headaches.  Psychiatric/Behavioral:  Negative for confusion, sleep disturbance and suicidal ideas.     Objective:  BP 130/82 (BP Location: Right Arm, Patient Position: Sitting, Cuff Size: Normal)   Pulse (!) 59   Temp 98.7 F (37.1 C) (Oral)   Ht 5\' 6"  (1.676 m)   Wt 194 lb (88 kg)   SpO2 94%   BMI 31.31 kg/m   BP Readings from Last 3 Encounters:  05/28/22 130/82  04/22/22 (!) 180/80  04/16/22 128/86    Wt Readings from Last 3 Encounters:  05/28/22 194 lb (88 kg)  05/21/22 188 lb (85.3 kg)  04/22/22 189 lb (85.7 kg)    Physical  Exam Constitutional:      General: She is not in acute distress.    Appearance: She is well-developed.  HENT:     Head: Normocephalic.     Right Ear: External ear normal.     Left Ear: External ear normal.     Nose: Nose normal.  Eyes:     General:        Right eye: No discharge.        Left eye: No discharge.     Conjunctiva/sclera: Conjunctivae normal.     Pupils: Pupils are equal, round, and reactive to light.  Neck:     Thyroid: No thyromegaly.     Vascular: No JVD.     Trachea: No tracheal deviation.  Cardiovascular:     Rate and Rhythm: Normal rate and regular rhythm.     Heart sounds: Normal heart sounds.  Pulmonary:     Effort: No respiratory distress.     Breath sounds: No stridor. No wheezing.  Abdominal:     General: Bowel sounds are normal. There is no distension.     Palpations: Abdomen is soft. There is no mass.     Tenderness: There is no abdominal tenderness. There is no guarding or rebound.  Musculoskeletal:        General: No tenderness.     Cervical back: Normal range of motion and neck supple. No rigidity.  Lymphadenopathy:  Cervical: No cervical adenopathy.  Skin:    Findings: No erythema or rash.  Neurological:     Cranial Nerves: No cranial nerve deficit.     Motor: No abnormal muscle tone.     Coordination: Coordination normal.     Deep Tendon Reflexes: Reflexes normal.  Psychiatric:        Behavior: Behavior normal.        Thought Content: Thought content normal.        Judgment: Judgment normal.    Lab Results  Component Value Date   WBC 6.8 05/25/2022   HGB 14.4 05/25/2022   HCT 42.1 05/25/2022   PLT 247.0 05/25/2022   GLUCOSE 114 (H) 05/25/2022   CHOL 227 (H) 01/27/2021   TRIG 91.0 01/27/2021   HDL 50.90 01/27/2021   LDLDIRECT 132.0 07/10/2019   LDLCALC 158 (H) 01/27/2021   ALT 14 05/25/2022   AST 17 05/25/2022   NA 138 05/25/2022   K 4.1 05/25/2022   CL 103 05/25/2022   CREATININE 0.68 05/25/2022   BUN 11 05/25/2022    CO2 27 05/25/2022   TSH 1.34 08/02/2020   INR 1.0 08/07/2019   HGBA1C 5.9 10/09/2021    No results found.  Assessment & Plan:   Problem List Items Addressed This Visit       Cardiovascular and Mediastinum   Essential hypertension - Primary (Chronic)    BP Readings from Last 3 Encounters:  05/28/22 130/82  04/22/22 (!) 180/80  04/16/22 128/86  Nl BP       Relevant Medications   amLODipine-olmesartan (AZOR) 5-20 MG tablet     Other   Chronic Fatigue (Chronic)    Post-COVID      Hyperglycemia    Monitor A1c      Prediabetes    Monitor A1c      Vitamin D deficiency (Chronic)    On Vit D         Meds ordered this encounter  Medications  . amLODipine-olmesartan (AZOR) 5-20 MG tablet    Sig: Take 1 tablet by mouth daily.    Dispense:  90 tablet    Refill:  3      Follow-up: Return in about 3 months (around 08/28/2022) for a follow-up visit.  Walker Kehr, MD

## 2022-05-28 NOTE — Assessment & Plan Note (Signed)
BP Readings from Last 3 Encounters:  05/28/22 130/82  04/22/22 (!) 180/80  04/16/22 128/86  Nl BP

## 2022-05-28 NOTE — Assessment & Plan Note (Signed)
On Vit D 

## 2022-06-02 ENCOUNTER — Encounter: Payer: Self-pay | Admitting: Gastroenterology

## 2022-06-08 ENCOUNTER — Encounter: Payer: Self-pay | Admitting: Internal Medicine

## 2022-06-11 ENCOUNTER — Encounter: Payer: Self-pay | Admitting: Gastroenterology

## 2022-06-11 ENCOUNTER — Ambulatory Visit (AMBULATORY_SURGERY_CENTER): Payer: Medicare Other | Admitting: Gastroenterology

## 2022-06-11 VITALS — BP 167/81 | HR 66 | Temp 97.3°F | Resp 10 | Ht 66.0 in | Wt 194.0 lb

## 2022-06-11 DIAGNOSIS — Z86010 Personal history of colonic polyps: Secondary | ICD-10-CM | POA: Diagnosis not present

## 2022-06-11 DIAGNOSIS — Z09 Encounter for follow-up examination after completed treatment for conditions other than malignant neoplasm: Secondary | ICD-10-CM | POA: Diagnosis not present

## 2022-06-11 DIAGNOSIS — K921 Melena: Secondary | ICD-10-CM

## 2022-06-11 DIAGNOSIS — D123 Benign neoplasm of transverse colon: Secondary | ICD-10-CM

## 2022-06-11 MED ORDER — SODIUM CHLORIDE 0.9 % IV SOLN: 500.0000 mL | Freq: Once | INTRAVENOUS | Status: DC

## 2022-06-11 NOTE — Progress Notes (Signed)
See 05/28/2022 H&P, no changes

## 2022-06-11 NOTE — Op Note (Signed)
[facility] Patient Name: Bailey [name] Procedure Date: 06/11/2022 1:25 PM MRN: [id number] Endoscopist: Meryl Dare , MD, [phone] Age: 72 Referring MD:  Date of Birth: June 27, 1950 Gender: Female Account #: [id number] Procedure:                Colonoscopy Indications:              Surveillance: Personal history of adenomatous                            polyps on last colonoscopy 3 years ago Medicines:                Monitored Anesthesia Care Procedure:                Pre-Anesthesia Assessment:                           - Prior to the procedure, a History and Physical                            was performed, and patient medications and                            allergies were reviewed. The patient's tolerance of                            previous anesthesia was also reviewed. The risks                            and benefits of the procedure and the sedation                            options and risks were discussed with the patient.                            All questions were answered, and informed consent                            was obtained. Prior Anticoagulants: The patient has                            taken no anticoagulant or antiplatelet agents. ASA                            Grade Assessment: II - A patient with mild systemic                            disease. After reviewing the risks and benefits,                            the patient was deemed in satisfactory condition to                            undergo the procedure.  After obtaining informed consent, the colonoscope                            was passed under direct vision. Throughout the                            procedure, the patient's blood pressure, pulse, and                            oxygen saturations were monitored continuously. The                            CF [id number] #[id number] was introduced through the anus                            and advanced to the  the cecum, identified by                            appendiceal orifice and ileocecal valve. The                            ileocecal valve, appendiceal orifice, and rectum                            were photographed. The quality of the bowel                            preparation was adequate. The colonoscopy was                            performed without difficulty. The patient tolerated                            the procedure well. Scope In: 1:32:54 PM Scope Out: 1:54:48 PM Scope Withdrawal Time: 0 hours 17 minutes 37 seconds  Total Procedure Duration: 0 hours 21 minutes 54 seconds  Findings:                 The perianal and digital rectal examinations were                            normal.                           A 6 mm polyp was found in the transverse colon. The                            polyp was sessile. The polyp was removed with a                            cold snare. Resection and retrieval were complete.                           Multiple medium-mouthed and small-mouthed  diverticula were found in the right colon. There                            was no evidence of diverticular bleeding.                           Multiple medium-mouthed and small-mouthed                            diverticula were found in the left colon. There was                            no evidence of diverticular bleeding.                           Internal hemorrhoids were found during                            retroflexion. The hemorrhoids were small and Grade                            I (internal hemorrhoids that do not prolapse).                           The exam was otherwise without abnormality on                            direct and retroflexion views. Complications:            No immediate complications. Estimated blood loss:                            None. Estimated Blood Loss:     Estimated blood loss: none. Impression:               - One 6 mm polyp in  the transverse colon, removed                            with a cold snare. Resected and retrieved.                           - Mild diverticulosis in the right colon.                           - Moderate diverticulosis in the left colon.                           - Internal hemorrhoids.                           - The examination was otherwise normal on direct                            and retroflexion views. Recommendation:           - Repeat colonoscopy after studies are complete for  surveillance based on pathology results.                           - Patient has a contact number available for                            emergencies. The signs and symptoms of potential                            delayed complications were discussed with the                            patient. Return to normal activities tomorrow.                            Written discharge instructions were provided to the                            patient.                           - High fiber diet.                           - Continue present medications.                           - Await pathology results. Meryl Dare, MD 06/11/2022 2:00:17 PM This report has been signed electronically.

## 2022-06-11 NOTE — Patient Instructions (Addendum)
-  Handout on polyps, hemorrhoids, diverticulosis and high fiber diet provided -await pathology results -repeat colonoscopy for surveillance recommended. Date to be determined when pathology result become available   -Continue present medications   YOU HAD AN ENDOSCOPIC PROCEDURE TODAY AT THE [facility]:   Refer to the procedure report that was given to you for any specific questions about what was found during the examination.  If the procedure report does not answer your questions, please call your gastroenterologist to clarify.  If you requested that your care partner not be given the details of your procedure findings, then the procedure report has been included in a sealed envelope for you to review at your convenience later.  YOU SHOULD EXPECT: Some feelings of bloating in the abdomen. Passage of more gas than usual.  Walking can help get rid of the air that was put into your GI tract during the procedure and reduce the bloating. If you had a lower endoscopy (such as a colonoscopy or flexible sigmoidoscopy) you may notice spotting of blood in your stool or on the toilet paper. If you underwent a bowel prep for your procedure, you may not have a normal bowel movement for a few days.  Please Note:  You might notice some irritation and congestion in your nose or some drainage.  This is from the oxygen used during your procedure.  There is no need for concern and it should clear up in a day or so.  SYMPTOMS TO REPORT IMMEDIATELY:  Following lower endoscopy (colonoscopy or flexible sigmoidoscopy):  Excessive amounts of blood in the stool  Significant tenderness or worsening of abdominal pains  Swelling of the abdomen that is new, acute  Fever of 100F or higher  For urgent or emergent issues, a gastroenterologist can be reached at any hour by calling [phone]. Do not use MyChart messaging for urgent concerns.    DIET:  We do recommend a small meal at first, but then you may  proceed to your regular diet.  Drink plenty of fluids but you should avoid alcoholic beverages for 24 hours.  ACTIVITY:  You should plan to take it easy for the rest of today and you should NOT DRIVE or use heavy machinery until tomorrow (because of the sedation medicines used during the test).    FOLLOW UP: Our staff will call the number listed on your records the next business day following your procedure.  We will call around 7:15- 8:00 am to check on you and address any questions or concerns that you may have regarding the information given to you following your procedure. If we do not reach you, we will leave a message.     If any biopsies were taken you will be contacted by phone or by letter within the next 1-3 weeks.  Please call us at [phone] if you have not heard about the biopsies in 3 weeks.    SIGNATURES/CONFIDENTIALITY: You and/or your care partner have signed paperwork which will be entered into your electronic medical record.  These signatures attest to the fact that that the information above on your After Visit Summary has been reviewed and is understood.  Full responsibility of the confidentiality of this discharge information lies with you and/or your care-partner.

## 2022-06-11 NOTE — Progress Notes (Signed)
Called to room to assist during endoscopic procedure.  Patient ID and intended procedure confirmed with present staff. Received instructions for my participation in the procedure from the performing physician.  

## 2022-06-12 ENCOUNTER — Telehealth: Payer: Self-pay | Admitting: *Deleted

## 2022-06-12 NOTE — Telephone Encounter (Signed)
  Follow up Call-     06/11/2022   12:58 PM  Call back number  Post procedure Call Back phone  # [phone]  Permission to leave phone message Yes     Patient questions:  Do you have a fever, pain , or abdominal swelling? No. Pain Score  0 *  Have you tolerated food without any problems? Yes.    Have you been able to return to your normal activities? Yes.    Do you have any questions about your discharge instructions: Diet   No. Medications  No. Follow up visit  No.  Do you have questions or concerns about your Care? No.  Actions: * If pain score is 4 or above: No action needed, pain <4.

## 2022-06-22 ENCOUNTER — Encounter: Payer: Self-pay | Admitting: Gastroenterology

## 2022-06-24 ENCOUNTER — Encounter: Payer: Self-pay | Admitting: Dermatology

## 2022-06-24 ENCOUNTER — Ambulatory Visit: Payer: Medicare Other | Admitting: Dermatology

## 2022-06-24 VITALS — BP 135/89

## 2022-06-24 DIAGNOSIS — L820 Inflamed seborrheic keratosis: Secondary | ICD-10-CM

## 2022-06-24 DIAGNOSIS — Z1283 Encounter for screening for malignant neoplasm of skin: Secondary | ICD-10-CM

## 2022-06-24 DIAGNOSIS — W908XXA Exposure to other nonionizing radiation, initial encounter: Secondary | ICD-10-CM | POA: Diagnosis not present

## 2022-06-24 DIAGNOSIS — L578 Other skin changes due to chronic exposure to nonionizing radiation: Secondary | ICD-10-CM

## 2022-06-24 DIAGNOSIS — L814 Other melanin hyperpigmentation: Secondary | ICD-10-CM | POA: Diagnosis not present

## 2022-06-24 DIAGNOSIS — L821 Other seborrheic keratosis: Secondary | ICD-10-CM

## 2022-06-24 DIAGNOSIS — L570 Actinic keratosis: Secondary | ICD-10-CM

## 2022-06-24 DIAGNOSIS — D225 Melanocytic nevi of trunk: Secondary | ICD-10-CM

## 2022-06-24 DIAGNOSIS — D1801 Hemangioma of skin and subcutaneous tissue: Secondary | ICD-10-CM

## 2022-06-24 DIAGNOSIS — X32XXXA Exposure to sunlight, initial encounter: Secondary | ICD-10-CM

## 2022-06-24 DIAGNOSIS — D492 Neoplasm of unspecified behavior of bone, soft tissue, and skin: Secondary | ICD-10-CM

## 2022-06-24 MED ORDER — FLUOROURACIL 5 % EX CREA: TOPICAL_CREAM | Freq: Two times a day (BID) | CUTANEOUS | 0 refills | Status: DC

## 2022-06-24 NOTE — Progress Notes (Signed)
New Patient Visit   Subjective  Bailey [name] is a 72 y.o. female who presents for the following: Skin Cancer Screening and Full Body Skin Exam  The patient presents for Total-Body Skin Exam (TBSE) for skin cancer screening and mole check. The patient has spots, moles and lesions to be evaluated, some may be new or changing and the patient has concerns that these could be cancer.  Prior patient of Dr. Jorja Loa. 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 No personal or family history of Melanoma. Last body exam was 9-10 months ago.     The following portions of the chart were reviewed this encounter and updated as appropriate: medications, allergies, medical history  Review of Systems:  No other skin or systemic complaints except as noted in HPI or Assessment and Plan.  Objective  Well appearing patient in no apparent distress; mood and affect are within normal limits.  A full examination was performed including scalp, head, eyes, ears, nose, lips, neck, chest, axillae, abdomen, back, buttocks, bilateral upper extremities, bilateral lower extremities, hands, feet, fingers, toes, fingernails, and toenails. All findings within normal limits unless otherwise noted below.   Relevant physical exam findings are noted in the Assessment and Plan.  Right clavicular chest 1 cm x 6mm pink papule       Assessment & Plan   LENTIGINES, SEBORRHEIC KERATOSES, HEMANGIOMAS - Benign normal skin lesions - Benign-appearing - Call for any changes  MELANOCYTIC NEVI - Tan-brown and/or pink-flesh-colored symmetric macules and papules - Benign appearing on exam today - Observation - Call clinic for new or changing moles - Recommend daily use of broad spectrum spf 30+ sunscreen to sun-exposed areas.   ACTINIC DAMAGE - Chronic condition, secondary to cumulative UV/sun exposure - diffuse scaly erythematous macules with underlying dyspigmentation - Recommend daily broad spectrum sunscreen SPF 30+ to  sun-exposed areas, reapply every 2 hours as needed.  - Staying in the shade or wearing long sleeves, sun glasses (UVA+UVB protection) and wide brim hats (4-inch brim around the entire circumference of the hat) are also recommended for sun protection.  - Call for new or changing lesions.    ACTINIC KERATOSIS Exam: Erythematous thin papules/macules with gritty scale at the forehead,  Pt defers freezing today.  Actinic keratoses are precancerous spots that appear secondary to cumulative UV radiation exposure/sun exposure over time. They are chronic with expected duration over 1 year. A portion of actinic keratoses will progress to squamous cell carcinoma of the skin. It is not possible to reliably predict which spots will progress to skin cancer and so treatment is recommended to prevent development of skin cancer.  Recommend daily broad spectrum sunscreen SPF 30+ to sun-exposed areas, reapply every 2 hours as needed.  Recommend staying in the shade or wearing long sleeves, sun glasses (UVA+UVB protection) and wide brim hats (4-inch brim around the entire circumference of the hat). Call for new or changing lesions.  Treatment Plan: Start 5-fluorouracil cream twice a day for 14 days to affected areas including To face and arms.  Reviewed course of treatment and expected reaction.  Patient advised to expect inflammation and crusting and advised that erosions are possible.  Patient advised to be diligent with sun protection during and after treatment. Handout with details of how to apply medication and what to expect provided. Counseled to keep medication out of reach of children and pets.  Reviewed course of treatment and expected reaction.  Patient advised to expect inflammation and crusting and  advised that erosions are possible.  Patient advised to be diligent with sun protection during and after treatment. Handout with details of how to apply medication and what to expect provided. Counseled to  keep medication out of reach of children and pets.   *SAMPLE of [place] 5-FU 4% provided in office today Exp: 2025/12 Lot: [id number]  SKIN CANCER SCREENING PERFORMED TODAY.    Neoplasm of skin Right clavicular chest  Skin / nail biopsy Type of biopsy: tangential   Informed consent: discussed and consent obtained   Timeout: patient name, date of birth, surgical site, and procedure verified   Procedure prep:  Patient was prepped and draped in usual sterile fashion Prep type:  Isopropyl alcohol Anesthesia: the lesion was anesthetized in a standard fashion   Anesthetic:  1% lidocaine w/ epinephrine 1-100,000 buffered w/ 8.4% NaHCO3 Instrument used: DermaBlade   Hemostasis achieved with: aluminum chloride   Outcome: patient tolerated procedure well   Post-procedure details: sterile dressing applied and wound care instructions given   Dressing type: petrolatum gauze and bandage    Specimen 1 - Surgical pathology Differential Diagnosis: R/O SCC  Check Margins: No    Return in about 6 months (around 12/24/2022) for AK Follow Up.  Jaclynn Guarneri, CMA, am acting as scribe for Langston Reusing, MD.   Documentation: I have reviewed the above documentation for accuracy and completeness, and I agree with the above.  Langston Reusing, MD

## 2022-06-24 NOTE — Patient Instructions (Addendum)
Due to recent changes in healthcare laws, you may see results of your pathology and/or laboratory studies on MyChart before the doctors have had a chance to review them. We understand that in some cases there may be results that are confusing or concerning to you. Please understand that not all results are received at the same time and often the doctors may need to interpret multiple results in order to provide you with the best plan of care or course of treatment. Therefore, we ask that you please give [place] 2 business days to thoroughly review all your results before contacting the office for clarification. Should we see a critical lab result, you will be contacted sooner.   If You Need Anything After Your Visit  If you have any questions or concerns for your doctor, please call our main line at [phone] If no one answers, please leave a voicemail as directed and we will return your call as soon as possible. Messages left after 4 pm will be answered the following business day.   You may also send [place] a message via [place]. We typically respond to MyChart messages within 1-2 business days.  For prescription refills, please ask your pharmacy to contact our office. Our fax number is [phone].  If you have an urgent issue when the clinic is closed that cannot wait until the next business day, you can page your doctor at the number below.    Please note that while we do our best to be available for urgent issues outside of office hours, we are not available 24/7.   If you have an urgent issue and are unable to reach [place], you may choose to seek medical care at your doctor's office, retail clinic, urgent care center, or emergency room.  If you have a medical emergency, please immediately call 911 or go to the emergency department. In the event of inclement weather, please call our main line at [phone] for an update on the status of any delays or closures.  Dermatology Medication Tips: Please  keep the boxes that topical medications come in in order to help keep track of the instructions about where and how to use these. Pharmacies typically print the medication instructions only on the boxes and not directly on the medication tubes.   If your medication is too expensive, please contact our office at [phone] or send [place] a message through [place].   We are unable to tell what your co-pay for medications will be in advance as this is different depending on your insurance coverage. However, we may be able to find a substitute medication at lower cost or fill out paperwork to get insurance to cover a needed medication.   If a prior authorization is required to get your medication covered by your insurance company, please allow [place] 1-2 business days to complete this process.  Drug prices often vary depending on where the prescription is filled and some pharmacies may offer cheaper prices.  The website www.goodrx.com contains coupons for medications through different pharmacies. The prices here do not account for what the cost may be with help from insurance (it may be cheaper with your insurance), but the website can give you the price if you did not use any insurance.  - You can print the associated coupon and take it with your prescription to the pharmacy.  - You may also stop by our office during regular business hours and pick up a GoodRx coupon card.  - If you need your  prescription sent electronically to a different pharmacy, notify our office through [facility] or by phone at [phone]    Skin Education :   I counseled the patient regarding the following: Sun screen (SPF 30 or greater) should be applied during peak UV exposure (between 10am and 2pm) and reapplied after exercise or swimming.  The ABCDEs of melanoma were reviewed with the patient, and the importance of monthly self-examination of moles was emphasized. Should any moles change in shape or color, or itch,  bleed or burn, pt will contact our office for evaluation sooner then their interval appointment.  Plan: Sunscreen Recommendations I recommended a broad spectrum sunscreen with a SPF of 30 or higher. I explained that SPF 30 sunscreens block approximately 97 percent of the sun's harmful rays. Sunscreens should be applied at least 15 minutes prior to expected sun exposure and then every 2 hours after that as long as sun exposure continues. If swimming or exercising sunscreen should be reapplied every 45 minutes to an hour after getting wet or sweating. One ounce, or the equivalent of a shot glass full of sunscreen, is adequate to protect the skin not covered by a bathing suit. I also recommended a lip balm with a sunscreen as well. Sun protective clothing can be used in lieu of sunscreen but must be worn the entire time you are exposed to the sun's rays. Patient Handout: Wound Care for Skin Biopsy Site  Patient Handout: Wound Care for Skin Biopsy Site  Taking Care of Your Skin Biopsy Site  Proper care of the biopsy site is essential for promoting healing and minimizing scarring. This handout provides instructions on how to care for your biopsy site to ensure optimal recovery.  1. Cleaning the Wound:  Clean the biopsy site daily with gentle soap and water. Gently pat the area dry with a clean, soft towel. Avoid harsh scrubbing or rubbing the area, as this can irritate the skin and delay healing.  2. Applying Aquaphor and Bandage:  After cleaning the wound, apply a thin layer of Aquaphor ointment to the biopsy site. Cover the area with a sterile bandage to protect it from dirt, bacteria, and friction. Change the bandage daily or as needed if it becomes soiled or wet.  3. Continued Care for One Week:  Repeat the cleaning, Aquaphor application, and bandaging process daily for one week following the biopsy procedure. Keeping the wound clean and moist during this initial healing period will help  prevent infection and promote optimal healing.  4. Massaging Aquaphor into the Area:  ---After one week, discontinue the use of bandages but continue to apply Aquaphor to the biopsy site. ----Gently massage the Aquaphor into the area using circular motions. ---Massaging the skin helps to promote circulation and prevent the formation of scar tissue.   Additional Tips:  Avoid exposing the biopsy site to direct sunlight during the healing process, as this can cause hyperpigmentation or worsen scarring. If you experience any signs of infection, such as increased redness, swelling, warmth, or drainage from the wound, contact your healthcare provider immediately. Follow any additional instructions provided by your healthcare provider for caring for the biopsy site and managing any discomfort. Conclusion:  Taking proper care of your skin biopsy site is crucial for ensuring optimal healing and minimizing scarring. By following these instructions for cleaning, applying Aquaphor, and massaging the area, you can promote a smooth and successful recovery. If you have any questions or concerns about caring for your biopsy site, don't  hesitate to contact your healthcare provider for guidance.

## 2022-06-25 DIAGNOSIS — R3581 Nocturnal polyuria: Secondary | ICD-10-CM | POA: Diagnosis not present

## 2022-06-25 DIAGNOSIS — R600 Localized edema: Secondary | ICD-10-CM | POA: Diagnosis not present

## 2022-06-25 DIAGNOSIS — N3000 Acute cystitis without hematuria: Secondary | ICD-10-CM | POA: Diagnosis not present

## 2022-06-29 NOTE — Progress Notes (Signed)
Hi Bailey [name]  Dr. Onalee Hua reviewed your biopsy results and it showed the spot removed was benign (not cancerous).  No additional treatment is required.  The detailed report is available to view in MyChart.  Have a great day!  Kind Regards,  Dr. Kermit Balo Care Team

## 2022-07-08 ENCOUNTER — Ambulatory Visit: Payer: Medicare Other | Admitting: Internal Medicine

## 2022-07-09 DIAGNOSIS — M62838 Other muscle spasm: Secondary | ICD-10-CM | POA: Diagnosis not present

## 2022-07-09 DIAGNOSIS — K5900 Constipation, unspecified: Secondary | ICD-10-CM | POA: Diagnosis not present

## 2022-07-09 DIAGNOSIS — M6289 Other specified disorders of muscle: Secondary | ICD-10-CM | POA: Diagnosis not present

## 2022-07-09 DIAGNOSIS — M6281 Muscle weakness (generalized): Secondary | ICD-10-CM | POA: Diagnosis not present

## 2022-07-24 DIAGNOSIS — M6289 Other specified disorders of muscle: Secondary | ICD-10-CM | POA: Diagnosis not present

## 2022-07-24 DIAGNOSIS — M6281 Muscle weakness (generalized): Secondary | ICD-10-CM | POA: Diagnosis not present

## 2022-07-24 DIAGNOSIS — K5900 Constipation, unspecified: Secondary | ICD-10-CM | POA: Diagnosis not present

## 2022-07-31 DIAGNOSIS — R300 Dysuria: Secondary | ICD-10-CM | POA: Diagnosis not present

## 2022-07-31 DIAGNOSIS — R319 Hematuria, unspecified: Secondary | ICD-10-CM | POA: Diagnosis not present

## 2022-07-31 DIAGNOSIS — N390 Urinary tract infection, site not specified: Secondary | ICD-10-CM | POA: Diagnosis not present

## 2022-08-25 ENCOUNTER — Ambulatory Visit: Payer: Medicare Other | Admitting: Internal Medicine

## 2022-08-26 DIAGNOSIS — L710 Perioral dermatitis: Secondary | ICD-10-CM | POA: Diagnosis not present

## 2022-09-08 DIAGNOSIS — R8271 Bacteriuria: Secondary | ICD-10-CM | POA: Diagnosis not present

## 2022-09-08 DIAGNOSIS — N3020 Other chronic cystitis without hematuria: Secondary | ICD-10-CM | POA: Diagnosis not present

## 2022-09-09 ENCOUNTER — Ambulatory Visit: Payer: Medicare Other | Admitting: Internal Medicine

## 2022-09-28 ENCOUNTER — Encounter: Payer: Self-pay | Admitting: Internal Medicine

## 2022-09-28 ENCOUNTER — Ambulatory Visit (INDEPENDENT_AMBULATORY_CARE_PROVIDER_SITE_OTHER): Payer: Medicare Other | Admitting: Internal Medicine

## 2022-09-28 VITALS — BP 134/80 | HR 61 | Temp 97.6°F | Ht 66.0 in | Wt 210.2 lb

## 2022-09-28 DIAGNOSIS — R5383 Other fatigue: Secondary | ICD-10-CM

## 2022-09-28 DIAGNOSIS — E559 Vitamin D deficiency, unspecified: Secondary | ICD-10-CM | POA: Diagnosis not present

## 2022-09-28 DIAGNOSIS — I10 Essential (primary) hypertension: Secondary | ICD-10-CM

## 2022-09-28 DIAGNOSIS — R42 Dizziness and giddiness: Secondary | ICD-10-CM | POA: Diagnosis not present

## 2022-09-28 DIAGNOSIS — E538 Deficiency of other specified B group vitamins: Secondary | ICD-10-CM | POA: Diagnosis not present

## 2022-09-28 DIAGNOSIS — D649 Anemia, unspecified: Secondary | ICD-10-CM

## 2022-09-28 DIAGNOSIS — N390 Urinary tract infection, site not specified: Secondary | ICD-10-CM

## 2022-09-28 DIAGNOSIS — R609 Edema, unspecified: Secondary | ICD-10-CM | POA: Insufficient documentation

## 2022-09-28 LAB — URINALYSIS
Bilirubin Urine: NEGATIVE
Ketones, ur: NEGATIVE
Leukocytes,Ua: NEGATIVE
Nitrite: NEGATIVE
Specific Gravity, Urine: <=1.005 — AB (ref 1.000–1.030)
Total Protein, Urine: NEGATIVE
Urine Glucose: NEGATIVE
Urobilinogen, UA: 0.2 (ref 0.0–1.0)
pH: 6 (ref 5.0–8.0)

## 2022-09-28 LAB — COMPREHENSIVE METABOLIC PANEL
ALT: 15 U/L (ref 0–35)
AST: 16 U/L (ref 0–37)
Albumin: 4.5 g/dL (ref 3.5–5.2)
Alkaline Phosphatase: 90 U/L (ref 39–117)
BUN: 17 mg/dL (ref 6–23)
CO2: 27 mEq/L (ref 19–32)
Calcium: 10.6 mg/dL — ABNORMAL HIGH (ref 8.4–10.5)
Chloride: 103 mEq/L (ref 96–112)
Creatinine, Ser: 0.74 mg/dL (ref 0.40–1.20)
GFR: 81 mL/min (ref >60.00)
Glucose, Bld: 116 mg/dL — ABNORMAL HIGH (ref 70–99)
Potassium: 4.3 mEq/L (ref 3.5–5.1)
Sodium: 138 mEq/L (ref 135–145)
Total Bilirubin: 0.9 mg/dL (ref 0.2–1.2)
Total Protein: 7.8 g/dL (ref 6.0–8.3)

## 2022-09-28 LAB — CBC WITH DIFFERENTIAL/PLATELET
Basophils Absolute: 0 10*3/uL (ref 0.0–0.1)
Basophils Relative: 0.6 % (ref 0.0–3.0)
Eosinophils Absolute: 0.1 10*3/uL (ref 0.0–0.7)
Eosinophils Relative: 1.1 % (ref 0.0–5.0)
HCT: 43.5 % (ref 36.0–46.0)
Hemoglobin: 14.6 g/dL (ref 12.0–15.0)
Lymphocytes Relative: 25.6 % (ref 12.0–46.0)
Lymphs Abs: 1.6 10*3/uL (ref 0.7–4.0)
MCHC: 33.5 g/dL (ref 30.0–36.0)
MCV: 93.5 fl (ref 78.0–100.0)
Monocytes Absolute: 0.4 10*3/uL (ref 0.1–1.0)
Monocytes Relative: 7 % (ref 3.0–12.0)
Neutro Abs: 4.1 10*3/uL (ref 1.4–7.7)
Neutrophils Relative %: 65.7 % (ref 43.0–77.0)
Platelets: 254 10*3/uL (ref 150.0–400.0)
RBC: 4.65 Mil/uL (ref 3.87–5.11)
RDW: 14.1 % (ref 11.5–15.5)
WBC: 6.3 10*3/uL (ref 4.0–10.5)

## 2022-09-28 LAB — T4, FREE: Free T4: 0.8 ng/dL (ref 0.60–1.60)

## 2022-09-28 LAB — TSH: TSH: 1.23 u[IU]/mL (ref 0.35–5.50)

## 2022-09-28 LAB — VITAMIN D 25 HYDROXY (VIT D DEFICIENCY, FRACTURES): VITD: 39.49 ng/mL (ref 30.00–100.00)

## 2022-09-28 LAB — VITAMIN B12: Vitamin B-12: 422 pg/mL (ref 211–911)

## 2022-09-28 LAB — CORTISOL: Cortisol, Plasma: 4.5 ug/dL

## 2022-09-28 MED ORDER — OLMESARTAN MEDOXOMIL 20 MG PO TABS: 20.0000 mg | ORAL_TABLET | Freq: Every day | ORAL | 3 refills | Status: DC

## 2022-09-28 NOTE — Assessment & Plan Note (Signed)
Post-COVID 

## 2022-09-28 NOTE — Assessment & Plan Note (Signed)
On Vit D 

## 2022-09-28 NOTE — Progress Notes (Signed)
Subjective:  Patient ID: Bailey [name], female    DOB: April 28, 1950  Age: 72 y.o. MRN: [id number]  CC: Medical Management of Chronic Issues (3 month follow up, swelling ankles (for about 3 to 4 weeks))   HPI Bailey [name] presents for vertigo after planting 90 flowers - dizzy since June.... C/o UTI x12, seeing Dr Ashley Royalty; now on a water pill - finished C/o leg swelling x 1 month  Outpatient Medications Prior to Visit  Medication Sig Dispense Refill   Cholecalciferol (VITAMIN D3) 50 MCG (2000 UT) capsule Take 1 capsule (2,000 Units total) by mouth daily. 100 capsule 3   phenazopyridine (PYRIDIUM) 95 MG tablet Take by mouth.     vitamin B-12 (V-R VITAMIN B-12) 500 MCG tablet Take 1 tablet (500 mcg total) by mouth daily. 100 tablet 3   amLODipine-olmesartan (AZOR) 5-20 MG tablet Take 1 tablet by mouth daily. 90 tablet 3   No facility-administered medications prior to visit.    ROS: Review of Systems  Constitutional:  Positive for fatigue. Negative for activity change, appetite change, chills and unexpected weight change.  HENT:  Negative for congestion, mouth sores and sinus pressure.   Eyes:  Negative for visual disturbance.  Respiratory:  Negative for cough, chest tightness and shortness of breath.   Cardiovascular:  Positive for leg swelling.  Gastrointestinal:  Negative for abdominal pain and nausea.  Genitourinary:  Negative for difficulty urinating, frequency and vaginal pain.  Musculoskeletal:  Negative for back pain and gait problem.  Skin:  Negative for pallor and rash.  Neurological:  Positive for dizziness and light-headedness. Negative for tremors, weakness, numbness and headaches.  Psychiatric/Behavioral:  Negative for confusion and sleep disturbance.     Objective:  BP 134/80   Pulse 61   Temp 97.6 F (36.4 C) (Oral)   Ht 5\' 6"  (1.676 m)   Wt 210 lb 4 oz (95.4 kg)   BMI 33.94 kg/m   BP Readings from Last 3 Encounters:  09/28/22 134/80  06/24/22 135/89   06/11/22 (!) 167/81    Wt Readings from Last 3 Encounters:  09/28/22 210 lb 4 oz (95.4 kg)  06/11/22 194 lb (88 kg)  05/28/22 194 lb (88 kg)    Physical Exam Constitutional:      General: She is not in acute distress.    Appearance: She is well-developed. She is obese.  HENT:     Head: Normocephalic.     Right Ear: External ear normal.     Left Ear: External ear normal.     Nose: Nose normal.  Eyes:     General:        Right eye: No discharge.        Left eye: No discharge.     Conjunctiva/sclera: Conjunctivae normal.     Pupils: Pupils are equal, round, and reactive to light.  Neck:     Thyroid: No thyromegaly.     Vascular: No JVD.     Trachea: No tracheal deviation.  Cardiovascular:     Rate and Rhythm: Normal rate and regular rhythm.     Heart sounds: Normal heart sounds.  Pulmonary:     Effort: No respiratory distress.     Breath sounds: No stridor. No wheezing.  Abdominal:     General: Bowel sounds are normal. There is no distension.     Palpations: Abdomen is soft. There is no mass.     Tenderness: There is no abdominal tenderness. There is no guarding or rebound.  Musculoskeletal:        General: No tenderness.     Cervical back: Normal range of motion and neck supple. No rigidity.     Right lower leg: Edema present.     Left lower leg: Edema present.  Lymphadenopathy:     Cervical: No cervical adenopathy.  Skin:    Findings: No erythema or rash.  Neurological:     Mental Status: She is oriented to person, place, and time.     Cranial Nerves: No cranial nerve deficit.     Motor: No abnormal muscle tone.     Coordination: Coordination normal.     Deep Tendon Reflexes: Reflexes normal.  Psychiatric:        Behavior: Behavior normal.        Thought Content: Thought content normal.        Judgment: Judgment normal.   Trace to 1+ edema B feet Lightheaded - orthostatic  Lab Results  Component Value Date   WBC 6.8 05/25/2022   HGB 14.4 05/25/2022    HCT 42.1 05/25/2022   PLT 247.0 05/25/2022   GLUCOSE 114 (H) 05/25/2022   CHOL 227 (H) 01/27/2021   TRIG 91.0 01/27/2021   HDL 50.90 01/27/2021   LDLDIRECT 132.0 07/10/2019   LDLCALC 158 (H) 01/27/2021   ALT 14 05/25/2022   AST 17 05/25/2022   NA 138 05/25/2022   K 4.1 05/25/2022   CL 103 05/25/2022   CREATININE 0.68 05/25/2022   BUN 11 05/25/2022   CO2 27 05/25/2022   TSH 1.34 08/02/2020   INR 1.0 08/07/2019   HGBA1C 5.9 10/09/2021    No results found.  Assessment & Plan:   Problem List Items Addressed This Visit     Essential hypertension (Chronic)    BP Readings from Last 3 Encounters:  09/28/22 134/80  06/24/22 135/89  06/11/22 (!) 167/81  Nl BP       Relevant Medications   olmesartan (BENICAR) 20 MG tablet   RESOLVED: UTI    On D mannose, abx Dr Ashley Royalty - recurrent      Relevant Orders   CBC with Differential/Platelet   Cortisol   Chronic Fatigue - Primary (Chronic)     Post-COVID       Relevant Orders   Comprehensive metabolic panel   CBC with Differential/Platelet   Iron, TIBC and Ferritin Panel   Vitamin B12   VITAMIN D 25 Hydroxy (Vit-D Deficiency, Fractures)   TSH   T4, free   Cortisol   Vitamin D deficiency (Chronic)    On Vit D      Edema    D/c Amlodipine      Lightheadedness    Lightheaded - orthostatic D/c Norvasc      Other Visit Diagnoses     B12 deficiency       Relevant Orders   Vitamin B12   Anemia, unspecified type       Relevant Orders   CBC with Differential/Platelet   Iron, TIBC and Ferritin Panel         Meds ordered this encounter  Medications   olmesartan (BENICAR) 20 MG tablet    Sig: Take 1 tablet (20 mg total) by mouth daily.    Dispense:  90 tablet    Refill:  3      Follow-up: Return in about 6 weeks (around 11/09/2022) for a follow-up visit.  Sonda Primes, MD

## 2022-09-28 NOTE — Assessment & Plan Note (Signed)
On D mannose, abx Dr Ashley Royalty - recurrent

## 2022-09-28 NOTE — Assessment & Plan Note (Signed)
D/c Amlodipine

## 2022-09-28 NOTE — Addendum Note (Signed)
Addended by: Tresa Garter on: 09/28/2022 10:30 AM   Modules accepted: Orders

## 2022-09-28 NOTE — Assessment & Plan Note (Signed)
BP Readings from Last 3 Encounters:  09/28/22 134/80  06/24/22 135/89  06/11/22 (!) 167/81  Nl BP

## 2022-09-28 NOTE — Assessment & Plan Note (Signed)
Lightheaded - orthostatic D/c Norvasc

## 2022-10-15 ENCOUNTER — Encounter (INDEPENDENT_AMBULATORY_CARE_PROVIDER_SITE_OTHER): Payer: Self-pay

## 2022-10-15 DIAGNOSIS — M62838 Other muscle spasm: Secondary | ICD-10-CM | POA: Diagnosis not present

## 2022-10-15 DIAGNOSIS — M6289 Other specified disorders of muscle: Secondary | ICD-10-CM | POA: Diagnosis not present

## 2022-10-15 DIAGNOSIS — M6281 Muscle weakness (generalized): Secondary | ICD-10-CM | POA: Diagnosis not present

## 2022-10-22 ENCOUNTER — Other Ambulatory Visit: Payer: Self-pay

## 2022-10-22 ENCOUNTER — Encounter (HOSPITAL_COMMUNITY): Payer: Self-pay | Admitting: Emergency Medicine

## 2022-10-22 ENCOUNTER — Emergency Department (HOSPITAL_COMMUNITY): Payer: Medicare Other

## 2022-10-22 ENCOUNTER — Encounter: Payer: Self-pay | Admitting: Internal Medicine

## 2022-10-22 ENCOUNTER — Emergency Department (HOSPITAL_COMMUNITY)
Admission: EM | Admit: 2022-10-22 | Discharge: 2022-10-23 | Disposition: A | Discharge status: Home / Self Care | Payer: Medicare Other | Attending: Emergency Medicine | Admitting: Emergency Medicine

## 2022-10-22 DIAGNOSIS — R079 Chest pain, unspecified: Secondary | ICD-10-CM | POA: Diagnosis not present

## 2022-10-22 DIAGNOSIS — Z96651 Presence of right artificial knee joint: Secondary | ICD-10-CM | POA: Insufficient documentation

## 2022-10-22 DIAGNOSIS — I10 Essential (primary) hypertension: Secondary | ICD-10-CM | POA: Insufficient documentation

## 2022-10-22 DIAGNOSIS — N3020 Other chronic cystitis without hematuria: Secondary | ICD-10-CM | POA: Diagnosis not present

## 2022-10-22 DIAGNOSIS — R519 Headache, unspecified: Secondary | ICD-10-CM | POA: Diagnosis present

## 2022-10-22 DIAGNOSIS — I771 Stricture of artery: Secondary | ICD-10-CM | POA: Diagnosis not present

## 2022-10-22 LAB — CBC
HCT: 41.4 % (ref 36.0–46.0)
Hemoglobin: 14.2 g/dL (ref 12.0–15.0)
MCH: 31.5 pg (ref 26.0–34.0)
MCHC: 34.3 g/dL (ref 30.0–36.0)
MCV: 91.8 fL (ref 80.0–100.0)
Platelets: 222 10*3/uL (ref 150–400)
RBC: 4.51 MIL/uL (ref 3.87–5.11)
RDW: 13.4 % (ref 11.5–15.5)
WBC: 6.8 10*3/uL (ref 4.0–10.5)
nRBC: 0 % (ref 0.0–0.2)

## 2022-10-22 NOTE — ED Triage Notes (Signed)
Pt has had many additional life stressors recently and health issues with herself and her husband.  States Dr. Posey Rea has been adjusting meds for BP and to help her LE swelling.  Pt does not feel herself and feels off balance.  This has also been addressed she states by Dr. Posey Rea.

## 2022-10-23 ENCOUNTER — Emergency Department (HOSPITAL_COMMUNITY): Payer: Medicare Other

## 2022-10-23 DIAGNOSIS — I771 Stricture of artery: Secondary | ICD-10-CM | POA: Diagnosis not present

## 2022-10-23 DIAGNOSIS — I10 Essential (primary) hypertension: Secondary | ICD-10-CM | POA: Diagnosis not present

## 2022-10-23 LAB — URINALYSIS, ROUTINE W REFLEX MICROSCOPIC
Bacteria, UA: NONE SEEN
Bilirubin Urine: NEGATIVE
Glucose, UA: NEGATIVE mg/dL
Ketones, ur: NEGATIVE mg/dL
Leukocytes,Ua: NEGATIVE
Nitrite: NEGATIVE
Protein, ur: NEGATIVE mg/dL
Specific Gravity, Urine: 1.008 (ref 1.005–1.030)
pH: 7 (ref 5.0–8.0)

## 2022-10-23 LAB — TROPONIN I (HIGH SENSITIVITY)
Troponin I (High Sensitivity): 17 ng/L (ref <18)
Troponin I (High Sensitivity): 17 ng/L (ref <18)

## 2022-10-23 LAB — BASIC METABOLIC PANEL
Anion gap: 15 (ref 5–15)
BUN: 17 mg/dL (ref 8–23)
CO2: 23 mmol/L (ref 22–32)
Calcium: 10.1 mg/dL (ref 8.9–10.3)
Chloride: 102 mmol/L (ref 98–111)
Creatinine, Ser: 0.97 mg/dL (ref 0.44–1.00)
GFR, Estimated: >60 mL/min (ref >60)
Glucose, Bld: 136 mg/dL — ABNORMAL HIGH (ref 70–99)
Potassium: 4.1 mmol/L (ref 3.5–5.1)
Sodium: 140 mmol/L (ref 135–145)

## 2022-10-23 MED ORDER — HYDRALAZINE HCL 25 MG PO TABS: 25.0000 mg | ORAL_TABLET | Freq: Three times a day (TID) | ORAL | 0 refills | Status: DC | PRN

## 2022-10-23 MED ORDER — ACETAMINOPHEN 500 MG PO TABS
1000.0000 mg | ORAL_TABLET | Freq: Once | ORAL | Status: AC
  Administered 2022-10-23: 1000 mg via ORAL
  Filled 2022-10-23: qty 2

## 2022-10-23 MED ORDER — HYDRALAZINE HCL 25 MG PO TABS
25.0000 mg | ORAL_TABLET | Freq: Once | ORAL | Status: AC
  Administered 2022-10-23: 25 mg via ORAL
  Filled 2022-10-23: qty 1

## 2022-10-23 NOTE — ED Notes (Signed)
To MRI

## 2022-10-23 NOTE — ED Provider Notes (Signed)
[facility] Emergency Department Provider Note MRN:  [id number]  Arrival date & time: 10/23/22     Chief Complaint   Hypertension and Weakness   History of Present Illness   Bailey [name] is a 72 y.o. year-old female with history of hypertension presenting to the ED with chief complaint of hypertension.  Elevated blood pressures ever since blood pressure medicine changed a few weeks ago.  Bad headache over the past couple days with dizziness, feels off balance when she walks.  Review of Systems  A thorough review of systems was obtained and all systems are negative except as noted in the HPI and PMH.   Patient's Health History    Past Medical History:  Diagnosis Date   Actinic keratosis    Anemia    Arthritis    Arthritis of right knee    Borderline diabetes    Burning sensation of feet    Colitis    hx colitis-gi bleed-2006   Constipation    DYSLIPIDEMIA    Gallbladder problem    GI bleed    January 2024   Headache    hx of 10 years ago   Hx of cardiovascular stress test    Lexiscan Myoview 9/14:  Small, fixed apical anterior perfusion defect (worse at rest) - probable soft tissue attenuation, EF 61%, low risk study   HYPERTENSION    Lower extremity edema    Partial tear of subscapularis tendon 04/22/2012   POSTMENOPAUSAL STATUS    Pre-diabetes    Supraspinatus tendon tear 04/22/2012   Tubular adenoma of colon 08/2013   Vitamin D deficiency     Past Surgical History:  Procedure Laterality Date   ABDOMINAL HYSTERECTOMY  1987   Partial   APPENDECTOMY  2003   CERVICAL FUSION  2002   COLONOSCOPY  06/03/04   isch colitis (hosp for same)   FRACTURE SURGERY     leg ? right  72 years old   HERNIA REPAIR  1975   umb   SHOULDER ARTHROSCOPY WITH ROTATOR CUFF REPAIR AND SUBACROMIAL DECOMPRESSION Right 04/22/2012   Procedure: SHOULDER ARTHROSCOPY WITH ROTATOR CUFF REPAIR AND SUBACROMIAL DECOMPRESSION;  Surgeon: Eulas Post, MD;  Location:  [facility];  Service: Orthopedics;  Laterality: Right;  RIGHT SHOULDER ARTHROSCOPY DEBRIDEMENT LIMITED, DECOMPRESSION SUBACROMIAL PARTIAL ACROMIOPLASTY WITH CORACOACROMIAL RELEASE, WITH ROTATOR CUFF REPAIR AND SUBSCAPULARIS REPAIR   TOTAL KNEE ARTHROPLASTY Right 08/14/2019   Procedure: TOTAL KNEE ARTHROPLASTY;  Surgeon: Salvatore Marvel, MD;  Location: [facility];  Service: Orthopedics;  Laterality: Right;   TUBAL LIGATION      Family History  Problem Relation Age of Onset   Hypertension Father    Stroke Father    Heart disease Father    Aneurysm Mother        brain   Stroke Mother    Stroke Sister    Hypertension Sister    Diabetes Brother    Colon cancer Neg Hx    Esophageal cancer Neg Hx    Rectal cancer Neg Hx    Stomach cancer Neg Hx     Social History   Socioeconomic History   Marital status: Married    Spouse name: Bluford Main   Number of children: Not on file   Years of education: Not on file   Highest education level: 12th grade  Occupational History   Not on file  Tobacco Use   Smoking status: Never   Smokeless tobacco: Never   Tobacco comments:  exposed to second hand (spouse smokes), Married  Vaping Use   Vaping status: Never Used  Substance and Sexual Activity   Alcohol use: No   Drug use: No   Sexual activity: Yes    Birth control/protection: Surgical  Other Topics Concern   Not on file  Social History Narrative   Not on file   Social Determinants of Health   Financial Resource Strain: Low Risk  (05/28/2022)   Overall Financial Resource Strain (CARDIA)    Difficulty of Paying Living Expenses: Not hard at all  Food Insecurity: No Food Insecurity (05/28/2022)   Hunger Vital Sign    Worried About Running Out of Food in the Last Year: Never true    Ran Out of Food in the Last Year: Never true  Transportation Needs: No Transportation Needs (05/28/2022)   PRAPARE - Administrator, Civil Service (Medical): No    Lack of  Transportation (Non-Medical): No  Physical Activity: Insufficiently Active (05/28/2022)   Exercise Vital Sign    Days of Exercise per Week: 2 days    Minutes of Exercise per Session: 20 min  Stress: No Stress Concern Present (05/28/2022)   [employer] of Occupational Health - Occupational Stress Questionnaire    Feeling of Stress : Not at all  Social Connections: Socially Integrated (05/28/2022)   Social Connection and Isolation Panel [NHANES]    Frequency of Communication with Friends and Family: More than three times a week    Frequency of Social Gatherings with Friends and Family: Three times a week    Attends Religious Services: More than 4 times per year    Active Member of Clubs or Organizations: Yes    Attends Banker Meetings: More than 4 times per year    Marital Status: Married  Catering manager Violence: Not At Risk (05/21/2022)   Humiliation, Afraid, Rape, and Kick questionnaire    Fear of Current or Ex-Partner: No    Emotionally Abused: No    Physically Abused: No    Sexually Abused: No     Physical Exam   Vitals:   10/23/22 0500 10/23/22 0550  BP: (!) 197/89 (!) 188/97  Pulse: (!) 59 (!) 57  Resp: 16 13  Temp:    SpO2: 99% 100%    CONSTITUTIONAL: Well-appearing, NAD NEURO/PSYCH:  Alert and oriented x 3, normal and symmetric strength and sensation, normal coordination, normal speech EYES:  eyes equal and reactive ENT/NECK:  no LAD, no JVD CARDIO: Regular rate, well-perfused, normal S1 and S2 PULM:  CTAB no wheezing or rhonchi GI/GU:  non-distended, non-tender MSK/SPINE:  No gross deformities, no edema SKIN:  no rash, atraumatic   *Additional and/or pertinent findings included in MDM below  Diagnostic and Interventional Summary    EKG Interpretation Date/Time:  Thursday October 22 2022 22:39:43 EDT Ventricular Rate:  63 PR Interval:  216 QRS Duration:  96 QT Interval:  418 QTC Calculation: 427 R Axis:   75  Text Interpretation: Sinus  rhythm with sinus arrhythmia with 1st degree A-V block Nonspecific ST abnormality Abnormal ECG When compared with ECG of 03-Nov-2012 11:04, PREVIOUS ECG IS PRESENT Confirmed by Kennis Carina [phone]) on 10/23/2022 6:03:37 AM       Labs Reviewed  BASIC METABOLIC PANEL - Abnormal; Notable for the following components:      Result Value   Glucose, Bld 136 (*)    All other components within normal limits  URINALYSIS, ROUTINE W REFLEX MICROSCOPIC - Abnormal; Notable for the  following components:   Color, Urine STRAW (*)    Hgb urine dipstick SMALL (*)    All other components within normal limits  CBC  TROPONIN I (HIGH SENSITIVITY)  TROPONIN I (HIGH SENSITIVITY)    MR BRAIN WO CONTRAST  Final Result    DG Chest 2 View  Final Result      Medications  hydrALAZINE (APRESOLINE) tablet 25 mg (25 mg Oral Given 10/23/22 0238)     Procedures  /  Critical Care Procedures  ED Course and Medical Decision Making  Initial Impression and Ddx Stroke is considered given patient's symptoms.  Patient is quite hypertensive.  Will allow for permissive hypertension given we are considering stroke.  Last systolic was 229 and so we will provide oral hydralazine.  She is in no acute distress and apart from the dizziness and possible ataxia she has no focal findings.  Awaiting MRI.  Past medical/surgical history that increases complexity of ED encounter:  HTN  Interpretation of Diagnostics I personally reviewed the EKG and my interpretation is as follows:  sinus rhythm without concerning ischemic features.  Labs without significant blood count or electrolyte disturbance.  Troponin negative.  Patient Reassessment and Ultimate Disposition/Management     MRI normal for age appropriate for discharge.  Patient management required discussion with the following services or consulting groups:  None  Complexity of Problems Addressed Acute illness or injury that poses threat of life of bodily  function  Additional Data Reviewed and Analyzed Further history obtained from: Further history from spouse/family member  Additional Factors Impacting ED Encounter Risk Prescriptions  Elmer Sow. Pilar Plate, MD [facility] Emergency Medicine [facility] [email or web address]  Final Clinical Impressions(s) / ED Diagnoses     ICD-10-CM   1. Hypertension, unspecified type  I10       ED Discharge Orders          Ordered    hydrALAZINE (APRESOLINE) 25 MG tablet  3 times daily PRN        10/23/22 0601             Discharge Instructions Discussed with and Provided to Patient:     Discharge Instructions      You were evaluated in the Emergency Department and after careful evaluation, we did not find any emergent condition requiring admission or further testing in the hospital.  Your exam/testing today was overall reassuring.  Continue your home medications and follow up with your regular doctor.  Until you see your PCP you can take the hydralazine up to 3 times daily as needed for blood pressures greater than 180 systolic (the first number) as we discussed.  Please return to the Emergency Department if you experience any worsening of your condition.  Thank you for allowing [place] to be a part of your care.        Sabas Sous, MD 10/23/22 [phone]

## 2022-10-23 NOTE — Discharge Instructions (Signed)
You were evaluated in the Emergency Department and after careful evaluation, we did not find any emergent condition requiring admission or further testing in the hospital.  Your exam/testing today was overall reassuring.  Continue your home medications and follow up with your regular doctor.  Until you see your PCP you can take the hydralazine up to 3 times daily as needed for blood pressures greater than 180 systolic (the first number) as we discussed.  Please return to the Emergency Department if you experience any worsening of your condition.  Thank you for allowing [place] to be a part of your care.

## 2022-10-23 NOTE — ED Notes (Signed)
Patient was assisted to the bathroom via wheelchair. She is now back in bed.

## 2022-10-23 NOTE — ED Notes (Signed)
Back from MRI.

## 2022-10-23 NOTE — ED Notes (Signed)
Patient was assisted to the bathroom via wheelchair. She is now back in bed. Urine sample sent.

## 2022-10-25 ENCOUNTER — Encounter: Payer: Self-pay | Admitting: Internal Medicine

## 2022-10-25 NOTE — Progress Notes (Unsigned)
      Subjective:    Patient ID: Bailey [name], female    DOB: 03/04/1950, 72 y.o.   MRN: [id number]     HPI Najma is here for follow up from the ED  ED 10/22/22 for hypertension and weakness  BP had been elevated at home since BP medication was changed a few weeks prior ( amlodipine stopped due to edema and orthostasis).  She had a headache, dizziness, felt off balance for a couple of days.   BP in ED  197/89,  59   188/97   57.4  ED work up ( EKG, CXR, cbc, bmp, troponin x 2, MRI brain unremarkable)  Received hydralazine 25 mg x 1 and started on hydralazine 25 mg TID prn.        Medications and allergies reviewed with patient and updated if appropriate.  Current Outpatient Medications on File Prior to Visit  Medication Sig Dispense Refill   Cholecalciferol (VITAMIN D3) 50 MCG (2000 UT) capsule Take 1 capsule (2,000 Units total) by mouth daily. 100 capsule 3   hydrALAZINE (APRESOLINE) 25 MG tablet Take 1 tablet (25 mg total) by mouth 3 (three) times daily as needed. 30 tablet 0   olmesartan (BENICAR) 20 MG tablet Take 1 tablet (20 mg total) by mouth daily. 90 tablet 3   phenazopyridine (PYRIDIUM) 95 MG tablet Take by mouth.     vitamin B-12 (V-R VITAMIN B-12) 500 MCG tablet Take 1 tablet (500 mcg total) by mouth daily. 100 tablet 3   No current facility-administered medications on file prior to visit.     Review of Systems     Objective:  There were no vitals filed for this visit. BP Readings from Last 3 Encounters:  10/23/22 (!) 191/89  09/28/22 134/80  06/24/22 135/89   Wt Readings from Last 3 Encounters:  09/28/22 210 lb 4 oz (95.4 kg)  06/11/22 194 lb (88 kg)  05/28/22 194 lb (88 kg)   There is no height or weight on file to calculate BMI.    Physical Exam     Lab Results  Component Value Date   WBC 6.8 10/22/2022   HGB 14.2 10/22/2022   HCT 41.4 10/22/2022   PLT 222 10/22/2022   GLUCOSE 136 (H) 10/22/2022   CHOL 227 (H) 01/27/2021   TRIG  91.0 01/27/2021   HDL 50.90 01/27/2021   LDLDIRECT 132.0 07/10/2019   LDLCALC 158 (H) 01/27/2021   ALT 15 09/28/2022   AST 16 09/28/2022   NA 140 10/22/2022   K 4.1 10/22/2022   CL 102 10/22/2022   CREATININE 0.97 10/22/2022   BUN 17 10/22/2022   CO2 23 10/22/2022   TSH 1.23 09/28/2022   INR 1.0 08/07/2019   HGBA1C 5.9 10/09/2021     Assessment & Plan:    See Problem List for Assessment and Plan of chronic medical problems.

## 2022-10-25 NOTE — Patient Instructions (Incomplete)
      Blood work was ordered.   Have this done later this week or early next week    Medications changes include :       A referral was ordered and someone will call you to schedule an appointment.     No follow-ups on file.

## 2022-10-26 ENCOUNTER — Ambulatory Visit (INDEPENDENT_AMBULATORY_CARE_PROVIDER_SITE_OTHER): Payer: Medicare Other | Admitting: Internal Medicine

## 2022-10-26 VITALS — BP 152/90 | HR 55 | Temp 97.7°F | Wt 202.0 lb

## 2022-10-26 DIAGNOSIS — R42 Dizziness and giddiness: Secondary | ICD-10-CM

## 2022-10-26 DIAGNOSIS — I10 Essential (primary) hypertension: Secondary | ICD-10-CM | POA: Diagnosis not present

## 2022-10-26 MED ORDER — AMLODIPINE-OLMESARTAN 5-20 MG PO TABS: 1.0000 | ORAL_TABLET | Freq: Every day | ORAL | Status: DC

## 2022-10-27 ENCOUNTER — Telehealth: Payer: Self-pay | Admitting: Internal Medicine

## 2022-10-27 NOTE — Telephone Encounter (Signed)
Notified pt.../l,b

## 2022-10-27 NOTE — Telephone Encounter (Signed)
Pt called and was seen on yesterday with Dr. Lawerance Bach pt has upcoming on 8/28 with Dr. Macario Golds. pt wants to know if Dr.Plot still need her to come and be seen by him. Please advise.

## 2022-10-28 ENCOUNTER — Ambulatory Visit: Payer: Medicare Other | Admitting: Internal Medicine

## 2022-10-29 ENCOUNTER — Ambulatory Visit: Payer: Medicare Other | Admitting: Internal Medicine

## 2022-10-29 DIAGNOSIS — N3281 Overactive bladder: Secondary | ICD-10-CM | POA: Diagnosis not present

## 2022-10-29 DIAGNOSIS — N390 Urinary tract infection, site not specified: Secondary | ICD-10-CM | POA: Diagnosis not present

## 2022-10-30 ENCOUNTER — Ambulatory Visit: Payer: Medicare Other | Attending: Internal Medicine | Admitting: Physical Therapy

## 2022-10-30 DIAGNOSIS — R42 Dizziness and giddiness: Secondary | ICD-10-CM | POA: Insufficient documentation

## 2022-10-30 DIAGNOSIS — R2681 Unsteadiness on feet: Secondary | ICD-10-CM | POA: Insufficient documentation

## 2022-10-30 NOTE — Therapy (Unsigned)
OUTPATIENT PHYSICAL THERAPY VESTIBULAR EVALUATION     Patient Name: Bailey [name] MRN: [id number] DOB:23-Jul-1950, 72 y.o., female Today's Date: 11/03/2022  END OF SESSION:   10/30/22 1536  PT Visits / Re-Eval  Visit Number 1  Number of Visits 4  Date for PT Re-Evaluation 12/15/22  Authorization  Authorization Type [employer]  PT Time Calculation  PT Start Time 1536  PT Stop Time 1615  PT Time Calculation (min) 39 min  PT - End of Session  Equipment Utilized During Treatment Gait belt  Activity Tolerance Patient tolerated treatment well  Behavior During Therapy WFL for tasks assessed/performed     Past Medical History:  Diagnosis Date   Actinic keratosis    Anemia    Arthritis    Arthritis of right knee    Borderline diabetes    Burning sensation of feet    Colitis    hx colitis-gi bleed-2006   Constipation    DYSLIPIDEMIA    Gallbladder problem    GI bleed    January 2024   Headache    hx of 10 years ago   Hx of cardiovascular stress test    Lexiscan Myoview 9/14:  Small, fixed apical anterior perfusion defect (worse at rest) - probable soft tissue attenuation, EF 61%, low risk study   HYPERTENSION    Lower extremity edema    Partial tear of subscapularis tendon 04/22/2012   POSTMENOPAUSAL STATUS    Pre-diabetes    Supraspinatus tendon tear 04/22/2012   Tubular adenoma of colon 08/2013   Vitamin D deficiency    Past Surgical History:  Procedure Laterality Date   ABDOMINAL HYSTERECTOMY  1987   Partial   APPENDECTOMY  2003   CERVICAL FUSION  2002   COLONOSCOPY  06/03/04   isch colitis (hosp for same)   FRACTURE SURGERY     leg ? right  72 years old   HERNIA REPAIR  1975   umb   SHOULDER ARTHROSCOPY WITH ROTATOR CUFF REPAIR AND SUBACROMIAL DECOMPRESSION Right 04/22/2012   Procedure: SHOULDER ARTHROSCOPY WITH ROTATOR CUFF REPAIR AND SUBACROMIAL DECOMPRESSION;  Surgeon: Eulas Post, MD;  Location: [facility];  Service:  Orthopedics;  Laterality: Right;  RIGHT SHOULDER ARTHROSCOPY DEBRIDEMENT LIMITED, DECOMPRESSION SUBACROMIAL PARTIAL ACROMIOPLASTY WITH CORACOACROMIAL RELEASE, WITH ROTATOR CUFF REPAIR AND SUBSCAPULARIS REPAIR   TOTAL KNEE ARTHROPLASTY Right 08/14/2019   Procedure: TOTAL KNEE ARTHROPLASTY;  Surgeon: Salvatore Marvel, MD;  Location: [facility];  Service: Orthopedics;  Laterality: Right;   TUBAL LIGATION     Patient Active Problem List   Diagnosis Date Noted   Edema 09/28/2022   Lightheadedness 09/28/2022   History of GI diverticular bleed 04/16/2022   History of COVID-19 04/16/2022   Hyperglycemia 10/09/2021   SUI (stress urinary incontinence, female) 05/29/2021   Zoster 05/05/2021   Female genital prolapse 12/05/2020   Preop exam for internal medicine 07/18/2019   Increased urinary frequency 04/06/2019   Statin declined 02/15/2019   Statin intolerance-   muscle cramps 02/15/2019   Prediabetes 08/17/2018   Incontinence in female 07/27/2018   Actinic keratoses 09/30/2017   h/o Intolerance of drug- statin 07/14/2017   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 07/14/2017   Inactivity 07/14/2017   Dysuria 06/23/2017   Abnormal urinalysis 06/23/2017   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   Chronic pain of right knee 12/21/2016   Abnormality of heart beat-  08/17/2016   Acute reaction to situational  stress 08/17/2016   GAD (generalized anxiety disorder) 08/17/2016   Hematuria 03/13/2016   Pelvic pressure in female 03/13/2016   Hx of cardiovascular stress test 12/22/2015   Vitamin D deficiency 12/22/2015   Generalized OA 10/29/2014   Primary localized osteoarthritis of right knee 10/29/2014   Chronic Fatigue 07/27/2014   Chronic constipation- sees GI 07/27/2014   Rectus diastasis 08/09/2013   Obesity 06/22/2013   Gallstone 07/27/2012   Acute diverticulitis 07/27/2012   Partial tear of subscapularis tendon 04/22/2012   Supraspinatus tendon tear  04/22/2012   Trochanteric bursitis of right hip    Asymptomatic postmenopausal status 01/04/2009   Dyslipidemia 09/27/2008   Essential hypertension 06/28/2008    PCP: Lolita Rieger, MD REFERRING PROVIDER: Pincus Sanes, MD  REFERRING DIAG: R42 (ICD-10-CM) - Vertigo  THERAPY DIAG:  Vertigo - Plan: PT plan of care cert/re-cert  Unsteadiness on feet - Plan: PT plan of care cert/re-cert  ONSET DATE: 10/26/2022 (referral date)  Rationale for Evaluation and Treatment: Rehabilitation  SUBJECTIVE:   SUBJECTIVE STATEMENT: Patient reports that at the start of June she was bending down planting lots of flowers and the next day felt very off. Patient reports that a doctor checked for "inner ear" by laying her back and turning head left and right but didn't see anything. Patient reports that her gait has felt very off recently. Patient reports that she occasionally feels lightheaded and very off balance particularly when looking up. Patient reports that she has to be careful when she first gets up as well. Patient reports that she is pretty active. Ambulates into clinic without AD. Patient just had blood work and looked good. Denies room spinning. Complex medical history over last year including hypertension and severe diverticulitis.   Pt accompanied by: self and husband in lobby  PERTINENT HISTORY: hypertension, complex medical stay within the last year  PAIN:  Are you having pain? No  PRECAUTIONS: Fall  RED FLAGS: Bowel or bladder incontinence: Yes: Being managed, leaky bladder    WEIGHT BEARING RESTRICTIONS: No  FALLS: Has patient fallen in last 6 months? No  LIVING ENVIRONMENT: Lives with: lives with their spouse Lives in: House/apartment Stairs: Yes: External: 3 steps; none Has following equipment at home: Single point cane, Walker - 2 wheeled, and shower chair  PLOF: Independent  PATIENT GOALS: "To get over this wave feeling so I can get up and walk  easier."  OBJECTIVE:   DIAGNOSTIC FINDINGS:   MRI BRAIN WO CONTRAST 10/23/2022: IMPRESSION: 1. No acute intracranial abnormality and mostly unremarkable noncontrast brain MRI for age. 2. Mild for age white matter signal changes, most commonly due to chronic small vessel disease.  COGNITION: Overall cognitive status: Within functional limits for tasks assessed   SENSATION: Reports burning in feet particularly during the night  EDEMA:   2+ edema in LE  Cervical ROM:    Active A/PROM (deg) eval  Flexion WFL  Extension 50%  Right lateral flexion WFL  Left lateral flexion WFL  Right rotation WFL  Left rotation WFL  (Blank rows = not tested)  GAIT: Gait pattern: step through pattern, poor foot clearance- Right, and poor foot clearance- Left Distance walked: 2 x 60 feet Assistive device utilized: None Level of assistance: SBA Comments: unsteady   PATIENT SURVEYS:  FOTO 56  VESTIBULAR ASSESSMENT:  GENERAL OBSERVATION: ambulates without AD, wears reading glasses only   SYMPTOM BEHAVIOR:  Subjective history: see above  Non-Vestibular symptoms: headaches Type of dizziness: Blurred Vision, Imbalance (Disequilibrium), Unsteady with  head/body turns, and Lightheadedness/Faint  Frequency: 1x day  Duration: until I lay in my bed, in the evening  Aggravating factors:  worse getting up in evening  Relieving factors: rest or being on the beach  Progression of symptoms: unchanged  Encouraged patient to monitor BP when having    VESTIBULAR TREATMENT:                                                                                                    Initial Eval only  PATIENT EDUCATION: Education details: POC, goal collaboration, examination findings Person educated: Patient Education method: Explanation Education comprehension: verbalized understanding and needs further education  HOME EXERCISE PROGRAM:  To be provided as indicated  GOALS: Goals reviewed with  patient? Yes   LONG TERM GOALS: Target date: 12/15/2022 (STG =  LTG due to POC length)   Patient will report demonstrate independence with final HEP in order to maintain current gains and continue to progress after physical therapy discharge.   Baseline: To be provided Goal status: INITIAL  2. MSQ to be assessed / LTG written as indicated Baseline: To be assessed Goal status: INITIAL  3.  FGA to be assessed / LTG written as indicated Baseline: To be assessed Goal status: INITIAL  4.  mCTSIB to be assessed / LTG written as indicated Baseline: To be assessed Goal status: INITIAL  ASSESSMENT:  CLINICAL IMPRESSION: Patient is a 72 y.o. female who was seen today for physical therapy evaluation and treatment for complaints of dizziness. Objective portion of exam limited by time constraints of exam and complex medical subjective. Patient dizziness unclear if related to extreme BP ranges; recommended BP tracking with symptoms to determine correlation and educated on safe BP readings and when to call PCP versus go to ED. Plan to complete remainder of vestibular exam next session to determine how to best address balance complaints and address dizziness as able to improve patient's safety in home. Continue POC.   OBJECTIVE IMPAIRMENTS: Abnormal gait, decreased balance, and dizziness.   ACTIVITY LIMITATIONS: bending, standing, squatting, and locomotion level  PARTICIPATION LIMITATIONS: cleaning, community activity, and yard work  PERSONAL FACTORS: Age, Past/current experiences, and 3+ comorbidities: see above  are also affecting patient's functional outcome.   REHAB POTENTIAL: Good  CLINICAL DECISION MAKING: Evolving/moderate complexity  EVALUATION COMPLEXITY: Moderate   PLAN:  PT FREQUENCY: 1x/week  PT DURATION: 3 weeks  PLANNED INTERVENTIONS: Therapeutic exercises, Therapeutic activity, Neuromuscular re-education, Balance training, Gait training, Patient/Family education, Self  Care, Vestibular training, Canalith repositioning, Aquatic Therapy, Dry Needling, and Re-evaluation  PLAN FOR NEXT SESSION: finish vestibular assessment (MSQ, FGA, mCTSIB and write LTGs)   Carmelia Bake, PT, DPT 11/03/2022, 11:39 AM

## 2022-11-03 ENCOUNTER — Encounter: Payer: Self-pay | Admitting: Physical Therapy

## 2022-11-04 ENCOUNTER — Telehealth: Payer: Self-pay

## 2022-11-04 NOTE — Telephone Encounter (Signed)
Transition Care Management Unsuccessful Follow-up Telephone Call  Date of discharge and from where:  [name]  Attempts:  1st  Reason for unsuccessful TCM follow-up call:  No answer/busy   Lenard Forth [place]  [facility], [facility] [place] Guide, Phone: [phone] Website: [email or web address]

## 2022-11-05 ENCOUNTER — Telehealth: Payer: Self-pay

## 2022-11-05 NOTE — Telephone Encounter (Signed)
Transition Care Management Follow-up Telephone Call Date of discharge and from where: [facility] How have you been since you were released from the hospital? Doing better and BP is better. Pt is going to Physical therapy and following up with providers Any questions or concerns? No  Items Reviewed: Did the pt receive and understand the discharge instructions provided? No  Medications obtained and verified? Yes  Other? No  Any new allergies since your discharge? No  Dietary orders reviewed? No Do you have support at home? Yes     Follow up appointments reviewed:  PCP Hospital f/u appt confirmed? Yes  Scheduled to see PCP on 9/10 @ . Specialist Hospital f/u appt confirmed? Yes  Scheduled to see  on  @ . Are transportation arrangements needed? No  If their condition worsens, is the pt aware to call PCP or go to the Emergency Dept.? Yes Was the patient provided with contact information for the PCP's office or ED? Yes Was to pt encouraged to call back with questions or concerns? Yes

## 2022-11-09 ENCOUNTER — Encounter: Payer: Self-pay | Admitting: Physical Therapy

## 2022-11-09 ENCOUNTER — Ambulatory Visit: Payer: Medicare Other | Attending: Internal Medicine | Admitting: Physical Therapy

## 2022-11-09 VITALS — BP 149/87 | HR 55

## 2022-11-09 DIAGNOSIS — R2681 Unsteadiness on feet: Secondary | ICD-10-CM | POA: Diagnosis not present

## 2022-11-09 DIAGNOSIS — R42 Dizziness and giddiness: Secondary | ICD-10-CM | POA: Diagnosis not present

## 2022-11-09 NOTE — Therapy (Signed)
OUTPATIENT PHYSICAL THERAPY VESTIBULAR TREATMENT / DISCHARGE   Patient Name: Bailey [name] MRN: [id number] DOB:Sep 21, 1950, 72 y.o., female Today's Date: 11/09/2022  PHYSICAL THERAPY DISCHARGE SUMMARY  Visits from Start of Care: 2  Current functional level related to goals / functional outcomes: Reduced dizziness   Remaining deficits: Lightheadedness with positional changes   Education / Equipment: When to return to PT, community exercise resources, pacing with positional changes   Patient agrees to discharge. Patient goals were  discontinued as no longer relevant as patient no major vestibular deficits . Patient is being discharged due to maximized rehab potential.   END OF SESSION:   PT End of Session - 11/09/22 0807     Visit Number 2    Number of Visits 4    Date for PT Re-Evaluation 12/15/22    Authorization Type [employer]    PT Start Time 0805    PT Stop Time 0850    PT Time Calculation (min) 45 min    Equipment Utilized During Treatment Gait belt    Activity Tolerance Patient tolerated treatment well    Behavior During Therapy WFL for tasks assessed/performed            Past Medical History:  Diagnosis Date   Actinic keratosis    Anemia    Arthritis    Arthritis of right knee    Borderline diabetes    Burning sensation of feet    Colitis    hx colitis-gi bleed-2006   Constipation    DYSLIPIDEMIA    Gallbladder problem    GI bleed    January 2024   Headache    hx of 10 years ago   Hx of cardiovascular stress test    Lexiscan Myoview 9/14:  Small, fixed apical anterior perfusion defect (worse at rest) - probable soft tissue attenuation, EF 61%, low risk study   HYPERTENSION    Lower extremity edema    Partial tear of subscapularis tendon 04/22/2012   POSTMENOPAUSAL STATUS    Pre-diabetes    Supraspinatus tendon tear 04/22/2012   Tubular adenoma of colon 08/2013   Vitamin D deficiency    Past Surgical History:  Procedure  Laterality Date   ABDOMINAL HYSTERECTOMY  1987   Partial   APPENDECTOMY  2003   CERVICAL FUSION  2002   COLONOSCOPY  06/03/04   isch colitis (hosp for same)   FRACTURE SURGERY     leg ? right  72 years old   HERNIA REPAIR  1975   umb   SHOULDER ARTHROSCOPY WITH ROTATOR CUFF REPAIR AND SUBACROMIAL DECOMPRESSION Right 04/22/2012   Procedure: SHOULDER ARTHROSCOPY WITH ROTATOR CUFF REPAIR AND SUBACROMIAL DECOMPRESSION;  Surgeon: Eulas Post, MD;  Location: [facility];  Service: Orthopedics;  Laterality: Right;  RIGHT SHOULDER ARTHROSCOPY DEBRIDEMENT LIMITED, DECOMPRESSION SUBACROMIAL PARTIAL ACROMIOPLASTY WITH CORACOACROMIAL RELEASE, WITH ROTATOR CUFF REPAIR AND SUBSCAPULARIS REPAIR   TOTAL KNEE ARTHROPLASTY Right 08/14/2019   Procedure: TOTAL KNEE ARTHROPLASTY;  Surgeon: Salvatore Marvel, MD;  Location: [facility];  Service: Orthopedics;  Laterality: Right;   TUBAL LIGATION     Patient Active Problem List   Diagnosis Date Noted   Edema 09/28/2022   Lightheadedness 09/28/2022   History of GI diverticular bleed 04/16/2022   History of COVID-19 04/16/2022   Hyperglycemia 10/09/2021   SUI (stress urinary incontinence, female) 05/29/2021   Zoster 05/05/2021   Female genital prolapse 12/05/2020   Preop exam for internal medicine 07/18/2019   Increased urinary frequency 04/06/2019  Statin declined 02/15/2019   Statin intolerance-   muscle cramps 02/15/2019   Prediabetes 08/17/2018   Incontinence in female 07/27/2018   Actinic keratoses 09/30/2017   h/o Intolerance of drug- statin 07/14/2017   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 07/14/2017   Inactivity 07/14/2017   Dysuria 06/23/2017   Abnormal urinalysis 06/23/2017   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   Chronic pain of right knee 12/21/2016   Abnormality of heart beat-  08/17/2016   Acute reaction to situational stress 08/17/2016   GAD (generalized anxiety disorder)  08/17/2016   Hematuria 03/13/2016   Pelvic pressure in female 03/13/2016   Hx of cardiovascular stress test 12/22/2015   Vitamin D deficiency 12/22/2015   Generalized OA 10/29/2014   Primary localized osteoarthritis of right knee 10/29/2014   Chronic Fatigue 07/27/2014   Chronic constipation- sees GI 07/27/2014   Rectus diastasis 08/09/2013   Obesity 06/22/2013   Gallstone 07/27/2012   Acute diverticulitis 07/27/2012   Partial tear of subscapularis tendon 04/22/2012   Supraspinatus tendon tear 04/22/2012   Trochanteric bursitis of right hip    Asymptomatic postmenopausal status 01/04/2009   Dyslipidemia 09/27/2008   Essential hypertension 06/28/2008    PCP: Lolita Rieger, MD REFERRING PROVIDER: Pincus Sanes, MD  REFERRING DIAG: R42 (ICD-10-CM) - Vertigo  THERAPY DIAG:  Vertigo  Unsteadiness on feet  ONSET DATE: 10/26/2022 (referral date)  Rationale for Evaluation and Treatment: Rehabilitation  SUBJECTIVE:   SUBJECTIVE STATEMENT: Patient reports that she called the pharmacy on Friday and informed the pharmacist that she had been having very dizzy spells. Patient was told by pharmacist that it was likely BP medication. Patient is hoping to get off her BP medication in order to feel better. Denies falls/near falls. Patient states that overall she is feeling better.    Pt accompanied by: self and husband in lobby  PERTINENT HISTORY: hypertension, complex medical stay within the last year  PAIN:  Are you having pain? No  PRECAUTIONS: Fall  RED FLAGS: Bowel or bladder incontinence: Yes: Being managed, leaky bladder    WEIGHT BEARING RESTRICTIONS: No  FALLS: Has patient fallen in last 6 months? No  LIVING ENVIRONMENT: Lives with: lives with their spouse Lives in: House/apartment Stairs: Yes: External: 3 steps; none Has following equipment at home: Single point cane, Walker - 2 wheeled, and shower chair  PLOF: Independent  PATIENT GOALS: "To get over  this wave feeling so I can get up and walk easier."  OBJECTIVE:   DIAGNOSTIC FINDINGS:   MRI BRAIN WO CONTRAST 10/23/2022: IMPRESSION: 1. No acute intracranial abnormality and mostly unremarkable noncontrast brain MRI for age. 2. Mild for age white matter signal changes, most commonly due to chronic small vessel disease.   VESTIBULAR TREATMENT:                                                                                                     VESTIBULAR ASSESSMENT:  OCULOMOTOR EXAM:  Ocular Alignment: normal  Ocular ROM: No Limitations  Spontaneous Nystagmus: absent  Gaze-Induced Nystagmus:  absent  Smooth Pursuits: saccades  Saccades: slow but grossly WNL  Convergence/Divergence: < 5 cm   Test of Skew: WNL  VESTIBULAR - OCULAR REFLEX:  Slow VOR: Comment: grossly WNL, intermittent minor correction, unclear if true positive as reduced when repeated ; negative  VOR Cancellation: Normal  Head-Impulse Test: HIT Right: negative HIT Left: negative  Dynamic Visual Acuity: Static: line 8 Dynamic: line 7 One line difference,   POSITIONAL TESTING:  Roll Test L: negative Roll Test R: negative Modified L Sidelying Test for Posterior Canal: Negative Modified R Sidelying Test for Posterior Canal: Negative   MOTION SENSITIVITY:    Motion Sensitivity Quotient Intensity: 0 = none, 1 = Lightheaded, 2 = Mild, 3 = Moderate, 4 = Severe, 5 = Vomiting  Intensity  1. Sitting to supine 0  2. Supine to L side 0  3. Supine to R side 2 but improves to 0 when repeated  4. Supine to sitting 1  5. L Hallpike-Dix   6. Up from L    7. R Hallpike-Dix   8. Up from R    9. Sitting, head  tipped to L knee 0  10. Head up from L  knee 0  11. Sitting, head  tipped to R knee 0  12. Head up from R  knee 0  13. Sitting head turns x5 0  14.Sitting head nods x5 1  15. In stance, 180  turn to L  0  16. In stance, 180  turn to R 0   TherAct: Provided group exercise class information and  education on pacing with positional changes.   PATIENT EDUCATION: Education details: Examination results / Discharge plan Person educated: Patient Education method: Explanation Education comprehension: verbalized understanding  HOME EXERCISE PROGRAM:  To be provided as indicated  GOALS: Goals reviewed with patient? Yes   LONG TERM GOALS: Target date: 12/15/2022 (STG =  LTG due to POC length)   Patient will report demonstrate independence with final HEP in order to maintain current gains and continue to progress after physical therapy discharge.   Baseline: Discontinued - not indicated Goal status: Discontinued - not indicated, recommend group exercise class for older adults  2. MSQ to be assessed / LTG written as indicated Baseline: Discontinued - WFL Goal status: Discontinued - WFL  3.  FGA to be assessed / LTG written as indicated Baseline: Discontinued  Goal status: Discontinued  4.  mCTSIB to be assessed / LTG written as indicated Baseline: Discontinued Goal status: Discontinued  ASSESSMENT:  CLINICAL IMPRESSION: Patient presents with no major vestibular deficits at this time. Reports feeling better than previous episodes of dizziness; reports some lightheadedness with supine to sitting. Mild motion sensitivity with rolling to R but no other indicators of hypofunction and improves with repetition. BP appears to be likely contributor given past history review so recommend continued work on BP management with physician and return to group exercises classes once medically stable. Provided resources for group exercise classes. Patient D/C at this time.   OBJECTIVE IMPAIRMENTS: Abnormal gait, decreased balance, and dizziness.   ACTIVITY LIMITATIONS: bending, standing, squatting, and locomotion level  PARTICIPATION LIMITATIONS: cleaning, community activity, and yard work  PERSONAL FACTORS: Age, Past/current experiences, and 3+ comorbidities: see above  are also affecting  patient's functional outcome.   REHAB POTENTIAL: Good  CLINICAL DECISION MAKING: Evolving/moderate complexity  EVALUATION COMPLEXITY: Moderate   PLAN:  PT FREQUENCY: 1x/week  PT DURATION: 3 weeks  PLANNED INTERVENTIONS: Therapeutic exercises, Therapeutic activity, Neuromuscular re-education, Balance  training, Gait training, Patient/Family education, Self Care, Vestibular training, Canalith repositioning, Aquatic Therapy, Dry Needling, and Re-evaluation  PLAN FOR NEXT SESSION: not indicated - Patient is D/C   Carmelia Bake, PT, DPT 11/09/2022, 10:04 AM

## 2022-11-10 ENCOUNTER — Ambulatory Visit (INDEPENDENT_AMBULATORY_CARE_PROVIDER_SITE_OTHER): Payer: Medicare Other | Admitting: Internal Medicine

## 2022-11-10 ENCOUNTER — Encounter: Payer: Self-pay | Admitting: Internal Medicine

## 2022-11-10 VITALS — BP 122/78 | HR 110 | Temp 98.6°F | Ht 66.0 in | Wt 200.0 lb

## 2022-11-10 DIAGNOSIS — F411 Generalized anxiety disorder: Secondary | ICD-10-CM

## 2022-11-10 DIAGNOSIS — I10 Essential (primary) hypertension: Secondary | ICD-10-CM

## 2022-11-10 DIAGNOSIS — R5383 Other fatigue: Secondary | ICD-10-CM | POA: Diagnosis not present

## 2022-11-10 DIAGNOSIS — R42 Dizziness and giddiness: Secondary | ICD-10-CM

## 2022-11-10 DIAGNOSIS — R739 Hyperglycemia, unspecified: Secondary | ICD-10-CM | POA: Diagnosis not present

## 2022-11-10 DIAGNOSIS — R32 Unspecified urinary incontinence: Secondary | ICD-10-CM | POA: Diagnosis not present

## 2022-11-10 MED ORDER — AMLODIPINE-OLMESARTAN 5-20 MG PO TABS: 1.0000 | ORAL_TABLET | Freq: Every day | ORAL | Status: DC

## 2022-11-10 MED ORDER — OLMESARTAN MEDOXOMIL 20 MG PO TABS: 20.0000 mg | ORAL_TABLET | Freq: Every day | ORAL | Status: DC

## 2022-11-10 NOTE — Assessment & Plan Note (Signed)
Chronic Not on treatment

## 2022-11-10 NOTE — Assessment & Plan Note (Signed)
Resolved

## 2022-11-10 NOTE — Assessment & Plan Note (Signed)
  Post-COVID- better

## 2022-11-10 NOTE — Progress Notes (Signed)
Subjective:  Patient ID: Bailey [name], female    DOB: [birth year]  Age: 72 y.o. MRN: [id number]  CC: Follow-up (6 WEEK F/U)   HPI AHMARI ORRIS presents for a f/u:  HTN - better. Edema - has resolved  No vertigo  Outpatient Medications Prior to Visit  Medication Sig Dispense Refill   Cholecalciferol (VITAMIN D3) 50 MCG (2000 UT) capsule Take 1 capsule (2,000 Units total) by mouth daily. 100 capsule 3   fluorouracil (EFUDEX) 5 % cream      hydrALAZINE (APRESOLINE) 25 MG tablet Take 1 tablet (25 mg total) by mouth 3 (three) times daily as needed. 30 tablet 0   phenazopyridine (PYRIDIUM) 95 MG tablet Take by mouth.     vitamin B-12 (V-R VITAMIN B-12) 500 MCG tablet Take 1 tablet (500 mcg total) by mouth daily. 100 tablet 3   amLODipine-olmesartan (AZOR) 5-20 MG tablet Take 1 tablet by mouth daily.     cephALEXin (KEFLEX) 250 MG capsule Take 250 mg by mouth daily. (Patient not taking: Reported on 11/10/2022)     No facility-administered medications prior to visit.    ROS: Review of Systems  Constitutional:  Positive for fatigue. Negative for activity change, appetite change, chills and unexpected weight change.  HENT:  Negative for congestion, mouth sores and sinus pressure.   Eyes:  Negative for visual disturbance.  Respiratory:  Negative for cough and chest tightness.   Gastrointestinal:  Negative for abdominal pain and nausea.  Genitourinary:  Negative for difficulty urinating, frequency and vaginal pain.  Musculoskeletal:  Negative for back pain and gait problem.  Skin:  Negative for pallor and rash.  Neurological:  Negative for dizziness, tremors, weakness, numbness and headaches.  Psychiatric/Behavioral:  Negative for confusion and sleep disturbance.     Objective:  BP 122/78 (BP Location: Left Arm, Patient Position: Sitting, Cuff Size: Large)   Pulse (!) 110   Temp 98.6 F (37 C) (Oral)   Ht 5\' 6"  (1.676 m)   Wt 200 lb (90.7 kg)   SpO2 97%   BMI 32.28 kg/m   BP  Readings from Last 3 Encounters:  11/10/22 122/78  11/09/22 (!) 149/87  10/26/22 (!) 152/90    Wt Readings from Last 3 Encounters:  11/10/22 200 lb (90.7 kg)  10/26/22 202 lb (91.6 kg)  09/28/22 210 lb 4 oz (95.4 kg)    Physical Exam Constitutional:      General: She is not in acute distress.    Appearance: She is well-developed. She is obese.  HENT:     Head: Normocephalic.     Right Ear: External ear normal.     Left Ear: External ear normal.     Nose: Nose normal.  Eyes:     General:        Right eye: No discharge.        Left eye: No discharge.     Conjunctiva/sclera: Conjunctivae normal.     Pupils: Pupils are equal, round, and reactive to light.  Neck:     Thyroid: No thyromegaly.     Vascular: No JVD.     Trachea: No tracheal deviation.  Cardiovascular:     Rate and Rhythm: Normal rate and regular rhythm.     Heart sounds: Normal heart sounds.  Pulmonary:     Effort: No respiratory distress.     Breath sounds: No stridor. No wheezing.  Abdominal:     General: Bowel sounds are normal. There is no distension.  Palpations: Abdomen is soft. There is no mass.     Tenderness: There is no abdominal tenderness. There is no guarding or rebound.  Musculoskeletal:        General: No tenderness.     Cervical back: Normal range of motion and neck supple. No rigidity.  Lymphadenopathy:     Cervical: No cervical adenopathy.  Skin:    Findings: No erythema or rash.  Neurological:     Mental Status: She is oriented to person, place, and time.     Cranial Nerves: No cranial nerve deficit.     Motor: No abnormal muscle tone.     Coordination: Coordination normal.     Deep Tendon Reflexes: Reflexes normal.  Psychiatric:        Behavior: Behavior normal.        Thought Content: Thought content normal.        Judgment: Judgment normal.   No edema  Lab Results  Component Value Date   WBC 6.8 10/22/2022   HGB 14.2 10/22/2022   HCT 41.4 10/22/2022   PLT 222  10/22/2022   GLUCOSE 136 (H) 10/22/2022   CHOL 227 (H) 01/27/2021   TRIG 91.0 01/27/2021   HDL 50.90 01/27/2021   LDLDIRECT 132.0 07/10/2019   LDLCALC 158 (H) 01/27/2021   ALT 15 09/28/2022   AST 16 09/28/2022   NA 140 10/22/2022   K 4.1 10/22/2022   CL 102 10/22/2022   CREATININE 0.97 10/22/2022   BUN 17 10/22/2022   CO2 23 10/22/2022   TSH 1.23 09/28/2022   INR 1.0 08/07/2019   HGBA1C 5.9 10/09/2021    MR BRAIN WO CONTRAST  Result Date: 10/23/2022 CLINICAL DATA:  72 year old female who presented with chest pain and hypertension. Off balance, neurologic deficit. EXAM: MRI HEAD WITHOUT CONTRAST TECHNIQUE: Multiplanar, multiecho pulse sequences of the brain and surrounding structures were obtained without intravenous contrast. COMPARISON:  None Available. FINDINGS: Brain: Overall cerebral volume appears within normal limits for age. No restricted diffusion to suggest acute infarction. No midline shift, mass effect, evidence of mass lesion, ventriculomegaly, extra-axial collection or acute intracranial hemorrhage. Cervicomedullary junction and pituitary are within normal limits. No cortical encephalomalacia or chronic cerebral blood products. Mild for age scattered small cerebral white matter T2 and FLAIR hyperintense foci in a nonspecific configuration. Minimal T2 heterogeneity in the deep gray nuclei, otherwise normal along with the brainstem and cerebellum. Vascular: Major intracranial vascular flow voids are preserved. Distal left vertebral artery appears mildly dominant. Mild intracranial artery tortuosity. Skull and upper cervical spine: Scaphocephaly, normal variant. Visualized bone marrow signal is within normal limits. Normal visible cervical spine. Sinuses/Orbits: Negative orbits. Mild paranasal sinus mucosal thickening, primarily in the right sphenoid. No sinus fluid levels identified. Other: Mastoids are well aerated. Visible internal auditory structures appear normal. Negative  visible scalp and face. IMPRESSION: 1. No acute intracranial abnormality and mostly unremarkable noncontrast brain MRI for age. 2. Mild for age white matter signal changes, most commonly due to chronic small vessel disease. Electronically Signed   By: Odessa Fleming M.D.   On: 10/23/2022 05:46   DG Chest 2 View  Result Date: 10/22/2022 CLINICAL DATA:  Chest pain EXAM: CHEST - 2 VIEW COMPARISON:  Abdominal series 06/01/2004 FINDINGS: The heart size and mediastinal contours are within normal limits. Both lungs are clear. Cervical spinal fusion plate is present. No acute fracture identified. IMPRESSION: No active cardiopulmonary disease. Electronically Signed   By: Darliss Cheney M.D.   On: 10/22/2022 23:35  Assessment & Plan:   Problem List Items Addressed This Visit     Essential hypertension - Primary (Chronic)    BP Readings from Last 3 Encounters:  11/10/22 122/78  11/09/22 (!) 149/87  10/26/22 (!) 152/90  Nl BP Olmesartan/Amlod 20/5 q am and Olmesart 20 mg at bedtime Ref to Med Nutrition      Relevant Medications   amLODipine-olmesartan (AZOR) 5-20 MG tablet   olmesartan (BENICAR) 20 MG tablet   Other Relevant Orders   Comprehensive metabolic panel   Amb ref to Medical Nutrition Therapy-MNT   Chronic Fatigue (Chronic)     Post-COVID- better      GAD (generalized anxiety disorder)    Chronic Not on treatment      Incontinence in female    S/p surgery        Hyperglycemia   Relevant Orders   Comprehensive metabolic panel   Hemoglobin A1c   Amb ref to Medical Nutrition Therapy-MNT   Lightheadedness    Resolved         Meds ordered this encounter  Medications   amLODipine-olmesartan (AZOR) 5-20 MG tablet    Sig: Take 1 tablet by mouth daily. Take in am   olmesartan (BENICAR) 20 MG tablet    Sig: Take 1 tablet (20 mg total) by mouth daily. Take at [facility]      Follow-up: Return in about 3 months (around 02/09/2023) for a follow-up visit.  Sonda Primes, MD

## 2022-11-10 NOTE — Assessment & Plan Note (Addendum)
S/p surgery 

## 2022-11-10 NOTE — Assessment & Plan Note (Addendum)
BP Readings from Last 3 Encounters:  11/10/22 122/78  11/09/22 (!) 149/87  10/26/22 (!) 152/90  Nl BP Olmesartan/Amlod 20/5 q am and Olmesart 20 mg at bedtime Ref to Med Nutrition

## 2022-11-13 ENCOUNTER — Telehealth: Payer: Self-pay | Admitting: Internal Medicine

## 2022-11-13 NOTE — Telephone Encounter (Signed)
If A1c is 6.5 or over than Dr. Macario Golds will need to place another order for the nutritionist with a different diagnosis.

## 2022-11-13 NOTE — Telephone Encounter (Signed)
Patient talked to the nutritionist and was told that her A1c would have to be checked before they can see her if her A1c is 6.5 or over in order for medicare to pay for this.   Please call patient and let her know if an order can be placed for the blood work sooner.  (A1C).  [phone]

## 2022-11-14 NOTE — Telephone Encounter (Signed)
Noted.  A1c order is in.  Thank you

## 2022-12-01 ENCOUNTER — Other Ambulatory Visit (INDEPENDENT_AMBULATORY_CARE_PROVIDER_SITE_OTHER): Payer: Medicare Other

## 2022-12-01 DIAGNOSIS — I10 Essential (primary) hypertension: Secondary | ICD-10-CM | POA: Diagnosis not present

## 2022-12-01 DIAGNOSIS — R739 Hyperglycemia, unspecified: Secondary | ICD-10-CM

## 2022-12-01 LAB — COMPREHENSIVE METABOLIC PANEL
ALT: 14 U/L (ref 0–35)
AST: 16 U/L (ref 0–37)
Albumin: 4.2 g/dL (ref 3.5–5.2)
Alkaline Phosphatase: 86 U/L (ref 39–117)
BUN: 12 mg/dL (ref 6–23)
CO2: 29 meq/L (ref 19–32)
Calcium: 10.1 mg/dL (ref 8.4–10.5)
Chloride: 103 meq/L (ref 96–112)
Creatinine, Ser: 0.78 mg/dL (ref 0.40–1.20)
GFR: 75.94 mL/min (ref >60.00)
Glucose, Bld: 110 mg/dL — ABNORMAL HIGH (ref 70–99)
Potassium: 3.8 meq/L (ref 3.5–5.1)
Sodium: 139 meq/L (ref 135–145)
Total Bilirubin: 0.7 mg/dL (ref 0.2–1.2)
Total Protein: 7.4 g/dL (ref 6.0–8.3)

## 2022-12-01 LAB — HEMOGLOBIN A1C: Hgb A1c MFr Bld: 5.9 % (ref 4.6–6.5)

## 2022-12-09 DIAGNOSIS — N3281 Overactive bladder: Secondary | ICD-10-CM | POA: Diagnosis not present

## 2022-12-09 DIAGNOSIS — N390 Urinary tract infection, site not specified: Secondary | ICD-10-CM | POA: Diagnosis not present

## 2022-12-24 ENCOUNTER — Ambulatory Visit: Payer: Medicare Other | Admitting: Dermatology

## 2022-12-29 ENCOUNTER — Ambulatory Visit: Payer: Medicare Other | Admitting: Dermatology

## 2023-02-10 ENCOUNTER — Other Ambulatory Visit (INDEPENDENT_AMBULATORY_CARE_PROVIDER_SITE_OTHER): Payer: Medicare Other

## 2023-02-10 ENCOUNTER — Other Ambulatory Visit: Payer: Self-pay

## 2023-02-10 DIAGNOSIS — R739 Hyperglycemia, unspecified: Secondary | ICD-10-CM | POA: Diagnosis not present

## 2023-02-10 LAB — COMPREHENSIVE METABOLIC PANEL
ALT: 14 U/L (ref 0–35)
AST: 14 U/L (ref 0–37)
Albumin: 4.2 g/dL (ref 3.5–5.2)
Alkaline Phosphatase: 82 U/L (ref 39–117)
BUN: 18 mg/dL (ref 6–23)
CO2: 29 meq/L (ref 19–32)
Calcium: 10 mg/dL (ref 8.4–10.5)
Chloride: 105 meq/L (ref 96–112)
Creatinine, Ser: 0.66 mg/dL (ref 0.40–1.20)
GFR: 87.59 mL/min (ref >60.00)
Glucose, Bld: 94 mg/dL (ref 70–99)
Potassium: 4.4 meq/L (ref 3.5–5.1)
Sodium: 138 meq/L (ref 135–145)
Total Bilirubin: 0.6 mg/dL (ref 0.2–1.2)
Total Protein: 7.2 g/dL (ref 6.0–8.3)

## 2023-02-10 LAB — HEMOGLOBIN A1C: Hgb A1c MFr Bld: 5.9 % (ref 4.6–6.5)

## 2023-02-10 NOTE — Addendum Note (Signed)
Addended byParticia Nearing on: 02/10/2023 10:53 AM   Modules accepted: Orders

## 2023-02-15 ENCOUNTER — Encounter: Payer: Self-pay | Admitting: Internal Medicine

## 2023-02-15 ENCOUNTER — Other Ambulatory Visit: Payer: Self-pay | Admitting: Internal Medicine

## 2023-02-15 ENCOUNTER — Ambulatory Visit (INDEPENDENT_AMBULATORY_CARE_PROVIDER_SITE_OTHER): Payer: Medicare Other | Admitting: Internal Medicine

## 2023-02-15 VITALS — BP 130/84 | HR 62 | Temp 98.2°F | Ht 66.0 in | Wt 198.0 lb

## 2023-02-15 DIAGNOSIS — N393 Stress incontinence (female) (male): Secondary | ICD-10-CM | POA: Diagnosis not present

## 2023-02-15 DIAGNOSIS — Z8616 Personal history of COVID-19: Secondary | ICD-10-CM | POA: Diagnosis not present

## 2023-02-15 DIAGNOSIS — E66811 Obesity, class 1: Secondary | ICD-10-CM | POA: Diagnosis not present

## 2023-02-15 DIAGNOSIS — I10 Essential (primary) hypertension: Secondary | ICD-10-CM

## 2023-02-15 MED ORDER — WEGOVY 0.25 MG/0.5ML ~~LOC~~ SOAJ: 0.2500 mg | SUBCUTANEOUS | 2 refills | Status: DC

## 2023-02-15 NOTE — Assessment & Plan Note (Signed)
BMI 31.96 Wegovy Rx was given (HTN, hyperglycemia, dyslipidemia)

## 2023-02-15 NOTE — Progress Notes (Signed)
Subjective:  Patient ID: Bailey [name], female    DOB: 1950/11/25  Age: 72 y.o. MRN: [id number]  CC: Medical Management of Chronic Issues (3 mnth f/u)   HPI Bailey [name] presents for HTN, CFS, hyperglycemia  Outpatient Medications Prior to Visit  Medication Sig Dispense Refill   amLODipine-olmesartan (AZOR) 5-20 MG tablet Take 1 tablet by mouth daily. Take in am     Cholecalciferol (VITAMIN D3) 50 MCG (2000 UT) capsule Take 1 capsule (2,000 Units total) by mouth daily. 100 capsule 3   fluorouracil (EFUDEX) 5 % cream      hydrALAZINE (APRESOLINE) 25 MG tablet Take 1 tablet (25 mg total) by mouth 3 (three) times daily as needed. 30 tablet 0   olmesartan (BENICAR) 20 MG tablet Take 1 tablet (20 mg total) by mouth daily. Take at HS     phenazopyridine (PYRIDIUM) 95 MG tablet Take by mouth.     vitamin B-12 (V-R VITAMIN B-12) 500 MCG tablet Take 1 tablet (500 mcg total) by mouth daily. 100 tablet 3   cephALEXin (KEFLEX) 250 MG capsule Take 250 mg by mouth daily. (Patient not taking: Reported on 02/15/2023)     No facility-administered medications prior to visit.    ROS: Review of Systems  Constitutional:  Negative for activity change, appetite change, chills, fatigue and unexpected weight change.  HENT:  Negative for congestion, mouth sores and sinus pressure.   Eyes:  Negative for visual disturbance.  Respiratory:  Negative for cough and chest tightness.   Gastrointestinal:  Negative for abdominal pain and nausea.  Genitourinary:  Negative for difficulty urinating, frequency and vaginal pain.  Musculoskeletal:  Negative for back pain and gait problem.  Skin:  Negative for pallor and rash.  Neurological:  Negative for dizziness, tremors, weakness, numbness and headaches.  Psychiatric/Behavioral:  Negative for confusion and sleep disturbance. The patient is nervous/anxious.     Objective:  BP 130/84 (BP Location: Left Arm, Patient Position: Sitting, Cuff Size: Normal)   Pulse  62   Temp 98.2 F (36.8 C) (Oral)   Ht 5\' 6"  (1.676 m)   Wt 198 lb (89.8 kg)   SpO2 97%   BMI 31.96 kg/m   BP Readings from Last 3 Encounters:  02/15/23 130/84  11/10/22 122/78  11/09/22 (!) 149/87    Wt Readings from Last 3 Encounters:  02/15/23 198 lb (89.8 kg)  11/10/22 200 lb (90.7 kg)  10/26/22 202 lb (91.6 kg)    Physical Exam Constitutional:      General: She is not in acute distress.    Appearance: She is well-developed. She is obese.  HENT:     Head: Normocephalic.     Right Ear: External ear normal.     Left Ear: External ear normal.     Nose: Nose normal.  Eyes:     General:        Right eye: No discharge.        Left eye: No discharge.     Conjunctiva/sclera: Conjunctivae normal.     Pupils: Pupils are equal, round, and reactive to light.  Neck:     Thyroid: No thyromegaly.     Vascular: No JVD.     Trachea: No tracheal deviation.  Cardiovascular:     Rate and Rhythm: Normal rate and regular rhythm.     Heart sounds: Normal heart sounds.  Pulmonary:     Effort: No respiratory distress.     Breath sounds: No stridor. No wheezing.  Abdominal:     General: Bowel sounds are normal. There is no distension.     Palpations: Abdomen is soft. There is no mass.     Tenderness: There is no abdominal tenderness. There is no guarding or rebound.  Musculoskeletal:        General: No tenderness.     Cervical back: Normal range of motion and neck supple. No rigidity.  Lymphadenopathy:     Cervical: No cervical adenopathy.  Skin:    Findings: No erythema or rash.  Neurological:     Cranial Nerves: No cranial nerve deficit.     Motor: No abnormal muscle tone.     Coordination: Coordination normal.     Deep Tendon Reflexes: Reflexes normal.  Psychiatric:        Behavior: Behavior normal.        Thought Content: Thought content normal.        Judgment: Judgment normal.     Lab Results  Component Value Date   WBC 6.8 10/22/2022   HGB 14.2 10/22/2022    HCT 41.4 10/22/2022   PLT 222 10/22/2022   GLUCOSE 94 02/10/2023   CHOL 227 (H) 01/27/2021   TRIG 91.0 01/27/2021   HDL 50.90 01/27/2021   LDLDIRECT 132.0 07/10/2019   LDLCALC 158 (H) 01/27/2021   ALT 14 02/10/2023   AST 14 02/10/2023   NA 138 02/10/2023   K 4.4 02/10/2023   CL 105 02/10/2023   CREATININE 0.66 02/10/2023   BUN 18 02/10/2023   CO2 29 02/10/2023   TSH 1.23 09/28/2022   INR 1.0 08/07/2019   HGBA1C 5.9 02/10/2023    MR BRAIN WO CONTRAST Result Date: 10/23/2022 CLINICAL DATA:  72 year old female who presented with chest pain and hypertension. Off balance, neurologic deficit. EXAM: MRI HEAD WITHOUT CONTRAST TECHNIQUE: Multiplanar, multiecho pulse sequences of the brain and surrounding structures were obtained without intravenous contrast. COMPARISON:  None Available. FINDINGS: Brain: Overall cerebral volume appears within normal limits for age. No restricted diffusion to suggest acute infarction. No midline shift, mass effect, evidence of mass lesion, ventriculomegaly, extra-axial collection or acute intracranial hemorrhage. Cervicomedullary junction and pituitary are within normal limits. No cortical encephalomalacia or chronic cerebral blood products. Mild for age scattered small cerebral white matter T2 and FLAIR hyperintense foci in a nonspecific configuration. Minimal T2 heterogeneity in the deep gray nuclei, otherwise normal along with the brainstem and cerebellum. Vascular: Major intracranial vascular flow voids are preserved. Distal left vertebral artery appears mildly dominant. Mild intracranial artery tortuosity. Skull and upper cervical spine: Scaphocephaly, normal variant. Visualized bone marrow signal is within normal limits. Normal visible cervical spine. Sinuses/Orbits: Negative orbits. Mild paranasal sinus mucosal thickening, primarily in the right sphenoid. No sinus fluid levels identified. Other: Mastoids are well aerated. Visible internal auditory structures appear  normal. Negative visible scalp and face. IMPRESSION: 1. No acute intracranial abnormality and mostly unremarkable noncontrast brain MRI for age. 2. Mild for age white matter signal changes, most commonly due to chronic small vessel disease. Electronically Signed   By: Odessa Fleming M.D.   On: 10/23/2022 05:46   DG Chest 2 View Result Date: 10/22/2022 CLINICAL DATA:  Chest pain EXAM: CHEST - 2 VIEW COMPARISON:  Abdominal series 06/01/2004 FINDINGS: The heart size and mediastinal contours are within normal limits. Both lungs are clear. Cervical spinal fusion plate is present. No acute fracture identified. IMPRESSION: No active cardiopulmonary disease. Electronically Signed   By: Darliss Cheney M.D.   On: 10/22/2022 23:35  Assessment & Plan:   Problem List Items Addressed This Visit     Essential hypertension - Primary (Chronic)   BP Readings from Last 3 Encounters:  02/15/23 130/84  11/10/22 122/78  11/09/22 (!) 149/87  Nl BP Olmesartan/Amlod 20/5 q am and Olmesart 20 mg at bedtime Ref to Med Nutrition      SUI (stress urinary incontinence, female)   F/u w/Dr Lavella Hammock       History of COVID-19   Obesity (BMI 30.0-34.9)   BMI 31.96 Wegovy Rx was given (HTN, hyperglycemia, dyslipidemia)         Meds ordered this encounter  Medications   Semaglutide-Weight Management (WEGOVY) 0.25 MG/0.5ML SOAJ    Sig: Inject 0.25 mg into the skin once a week.    Dispense:  2 mL    Refill:  2    BMI 31.96, hyperglycemia, HTN      Follow-up: Return in about 3 months (around 05/16/2023) for a follow-up visit.  Sonda Primes, MD

## 2023-02-15 NOTE — Assessment & Plan Note (Signed)
BP Readings from Last 3 Encounters:  02/15/23 130/84  11/10/22 122/78  11/09/22 (!) 149/87  Nl BP Olmesartan/Amlod 20/5 q am and Olmesart 20 mg at bedtime Ref to Med Nutrition

## 2023-02-15 NOTE — Assessment & Plan Note (Signed)
Post-COVID 

## 2023-02-15 NOTE — Assessment & Plan Note (Signed)
F/u w/Dr Maryland Pink

## 2023-02-16 ENCOUNTER — Encounter: Payer: Self-pay | Admitting: Dermatology

## 2023-02-16 ENCOUNTER — Ambulatory Visit: Payer: Medicare Other | Admitting: Dermatology

## 2023-02-16 VITALS — BP 141/91 | HR 59

## 2023-02-16 DIAGNOSIS — L578 Other skin changes due to chronic exposure to nonionizing radiation: Secondary | ICD-10-CM | POA: Diagnosis not present

## 2023-02-16 DIAGNOSIS — W908XXA Exposure to other nonionizing radiation, initial encounter: Secondary | ICD-10-CM | POA: Diagnosis not present

## 2023-02-16 DIAGNOSIS — Z5111 Encounter for antineoplastic chemotherapy: Secondary | ICD-10-CM

## 2023-02-16 DIAGNOSIS — L570 Actinic keratosis: Secondary | ICD-10-CM

## 2023-02-16 MED ORDER — TRIAMCINOLONE ACETONIDE 0.1 % EX OINT
1.0000 | TOPICAL_OINTMENT | Freq: Two times a day (BID) | CUTANEOUS | 0 refills | Status: AC | PRN
Start: 2023-02-16 — End: ?

## 2023-02-16 NOTE — Progress Notes (Signed)
   Follow-Up Visit   Subjective  Bailey [name] is a 72 y.o. female who presents for the following: AK  Patient present today for follow up visit for AK. Patient was last evaluated on 06/24/22.Patietn was prescribe Efudex that she used for one week in September Patient reports sxs are unchanged. Patient denies medication changes.  The following portions of the chart were reviewed this encounter and updated as appropriate: medications, allergies, medical history  Review of Systems:  No other skin or systemic complaints except as noted in HPI or Assessment and Plan.  Objective  Well appearing patient in no apparent distress; mood and affect are within normal limits.  A focused examination was performed of the following areas: Face  Relevant exam findings are noted in the Assessment and Plan.    Assessment & Plan   ACTINIC KERATOSIS Exam: Erythematous thin papules/macules with gritty scale at the FACE, CHEST, Pink hyperkeratotic papule on dorsal hand & Left anterior Ankle we are deferring biopsy today. Patient instructed to apply Efudex to those areasdorsal hand, left anterior ankle   Actinic keratoses are precancerous spots that appear secondary to cumulative UV radiation exposure/sun exposure over time. They are chronic with expected duration over 1 year. A portion of actinic keratoses will progress to squamous cell carcinoma of the skin. It is not possible to reliably predict which spots will progress to skin cancer and so treatment is recommended to prevent development of skin cancer.  Recommend daily broad spectrum sunscreen SPF 30+ to sun-exposed areas, reapply every 2 hours as needed.  Recommend staying in the shade or wearing long sleeves, sun glasses (UVA+UVB protection) and wide brim hats (4-inch brim around the entire circumference of the hat). Call for new or changing lesions.  Treatment Plan: Starting in January 5-fluorouracil cream twice a day for 14 days to affected  areas including face and chest.  Reviewed course of treatment and expected reaction.  Patient advised to expect inflammation and crusting and advised that erosions are possible.  Patient advised to be diligent with sun protection during and after treatment. Handout with details of how to apply medication and what to expect provided. Counseled to keep medication out of reach of children and pets.  Reviewed course of treatment and expected reaction.  Patient advised to expect inflammation and crusting and advised that erosions are possible.  Patient advised to be diligent with sun protection during and after treatment. Handout with details of how to apply medication and what to expect provided. Counseled to keep medication out of reach of children and pets.    ACTINIC SKIN DAMAGE   Related Medications triamcinolone ointment (KENALOG) 0.1 % Apply 1 Application topically 2 (two) times daily as needed (Rash).  Return in about 6 months (around 08/17/2023) for AK F/U.  Documentation: I have reviewed the above documentation for accuracy and completeness, and I agree with the above.  Stasia Cavalier, am acting as scribe for Langston Reusing, DO.  Langston Reusing, DO

## 2023-02-16 NOTE — Patient Instructions (Signed)
Hello Bailey [name],  Thank you for visiting my office today. Your dedication to addressing your skin health concerns is greatly appreciated. Below is a summary of our discussion and your treatment plan:  - Efudex Cream: Continue applying Efudex cream to your forehead, temples, sides of the face, and the V part of your chest.   - Application: Apply a thin layer both in the morning and at night.   - Duration: Start this treatment after the New Year and continue for two weeks.  - Post-Efudex Treatment: After completing the Efudex treatment, begin using triamcinolone 0.1% ointment.   - Application: Apply twice a day to the treated areas.   - Duration: Use the ointment for two weeks to enhance healing.  - Monitoring and Additional Treatment:   - Left Dorsal Hand: Monitor the pink hyperkeratotic papule near the thumb. Treat with Efudex twice a day for two weeks. If the lesion persists, please return for a possible biopsy.   - Left Anterior Ankle: Treat the recurring spot with Efudex as previously instructed.  - Prescription: The prescription for triamcinolone ointment will be sent to your pharmacy.  Please adhere to the treatment plan as outlined. If you have any questions or concerns, do not hesitate to contact our office.  Best regards,  Dr. [facility] Re[facility]

## 2023-02-18 ENCOUNTER — Other Ambulatory Visit (HOSPITAL_COMMUNITY): Payer: Self-pay

## 2023-02-18 ENCOUNTER — Telehealth: Payer: Self-pay

## 2023-02-18 NOTE — Telephone Encounter (Signed)
*  Primary  Pharmacy Patient Advocate Encounter   Received notification from RX Request Messages that prior authorization for Wegovy 0.25MG /0.5ML auto-injectors  is required/requested.   Insurance verification completed.   The patient is insured through [facility] .   Per test claim: PA required; PA submitted to above mentioned insurance via CoverMyMeds Key/confirmation #/EOC [id number] Status is pending

## 2023-02-18 NOTE — Telephone Encounter (Signed)
PA request has been Submitted. New Encounter created for follow up. For additional info see Pharmacy Prior Auth telephone encounter from 12/19.

## 2023-02-19 NOTE — Telephone Encounter (Signed)
Pharmacy Patient Advocate Encounter  Received notification from [facility] that Prior Authorization for [facility] 0.25mg  has been DENIED.  See denial reason below. No denial letter attached in CMM. Will attach denial letter to Media tab once received.   PA #/Case ID/Reference #:  [id number]

## 2023-02-25 ENCOUNTER — Other Ambulatory Visit: Payer: Self-pay | Admitting: Internal Medicine

## 2023-02-25 NOTE — Telephone Encounter (Signed)
Please see below message as PA has already been submitted and denied in a separate encounter and routed to clinical care team.   Please sign off on rx in this encounter as PA team is unable to resolve RX requests. Thank you

## 2023-03-28 ENCOUNTER — Encounter: Payer: Self-pay | Admitting: Internal Medicine

## 2023-03-31 ENCOUNTER — Other Ambulatory Visit: Payer: Self-pay | Admitting: Internal Medicine

## 2023-03-31 MED ORDER — AZITHROMYCIN 250 MG PO TABS: ORAL_TABLET | ORAL | 0 refills | Status: DC

## 2023-04-03 ENCOUNTER — Other Ambulatory Visit: Payer: Self-pay | Admitting: Internal Medicine

## 2023-04-03 MED ORDER — WEGOVY 0.25 MG/0.5ML ~~LOC~~ SOAJ: 0.2500 mg | SUBCUTANEOUS | 2 refills | Status: DC

## 2023-04-20 ENCOUNTER — Telehealth: Payer: Self-pay

## 2023-04-20 NOTE — Telephone Encounter (Signed)
Copied from CRM [id number]. Topic: Clinical - Prescription Issue >> Apr 20, 2023 11:36 AM Fredrich Romans wrote: Reason for CRM: [facility] called in stating that patient would like an appeal to be started for the denial of her [facility].She was denied due to provider stating that it wasn't a medical necessity for her to be on the wegovy. They would like for the provider to start the appeal process. Appeal process department:[phone]

## 2023-04-21 ENCOUNTER — Telehealth: Payer: Self-pay | Admitting: Pharmacist

## 2023-04-21 NOTE — Telephone Encounter (Signed)
The insurance rejected the [facility] because " Your requested medication is an anti-obesity medication that is excluded from Part D prescription coverage under Medicare rules, unless it is being used for a medically-accepted diagnosis approved by the Food and Drug Administration. Please refer to your Evidence of Coverage (EOC) section that references Part D drug coverage in your pharmacy plan documents for more information. Reviewed by: Maurice March *Please note: VHQION may be covered when used for a medically-accepted indication. The Food and Drug Administration has approved [phone] for the treatment to reduce the risk of major adverse cardiovascular events (cardiovascular death, non-fatal myocardial infarction, or non-fatal stroke)."  Medicare does not cover Wegovy for the indication of obesity. To meet the cardiovascular criteria, the patient must have a documented history of a previous heart attack (myocardial infarction), a previous stroke, or symptomatic peripheral arterial disease. Upon reviewing Ms. Capek chart, I do not see documentation supporting these conditions. Please advise on how you would like to proceed.  Thank you, Dellie Burns, PharmD Clinical Pharmacist  [place]  Direct Dial: [phone]

## 2023-04-27 ENCOUNTER — Ambulatory Visit (INDEPENDENT_AMBULATORY_CARE_PROVIDER_SITE_OTHER): Payer: Medicare Other | Admitting: Internal Medicine

## 2023-04-27 ENCOUNTER — Encounter: Payer: Self-pay | Admitting: Internal Medicine

## 2023-04-27 VITALS — BP 130/80 | HR 60 | Ht 66.5 in | Wt 192.6 lb

## 2023-04-27 DIAGNOSIS — M109 Gout, unspecified: Secondary | ICD-10-CM | POA: Diagnosis not present

## 2023-04-27 MED ORDER — PREDNISONE 10 MG PO TABS: ORAL_TABLET | ORAL | 0 refills | Status: DC

## 2023-04-27 NOTE — Assessment & Plan Note (Addendum)
 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 Marletta Lor routine follow-up next month and could have allopurinol level checked at that time since his level today does not determine whether or not this is truly gout Advised her to call if there is no improvement in her symptoms

## 2023-04-27 NOTE — Patient Instructions (Addendum)
 Medications changes include :   prednisone taper - take with food.  Do not take any advil while taking this -- taking tylenol is ok.      Return if symptoms worsen or fail to improve.    Gout  Gout is painful swelling of your joints. Gout is a type of arthritis. It is caused by having too much uric acid in your body. Uric acid is a chemical that is made when your body breaks down substances called purines. If your body has too much uric acid, sharp crystals can form and build up in your joints. This causes pain and swelling. Gout attacks can happen quickly and be very painful (acute gout). Over time, the attacks can affect more joints and happen more often (chronic gout). What are the causes? Gout is caused by too much uric acid in your blood. This can happen because: Your kidneys do not remove enough uric acid from your blood. Your body makes too much uric acid. You eat too many foods that are high in purines. These foods include organ meats, some seafood, and beer. Trauma or stress can bring on an attack. What increases the risk? Having a family history of gout. Being female and middle-aged. Being female and having gone through menopause. Having an organ transplant. Taking certain medicines. Having certain conditions, such as: Being very overweight (obese). Lead poisoning. Kidney disease. A skin condition called psoriasis. Other risks include: Losing weight too quickly. Not having enough water in the body (being dehydrated). Drinking alcohol, especially beer. Drinking beverages that are sweetened with a type of sugar called fructose. What are the signs or symptoms? An attack of acute gout often starts at night and usually happens in just one joint. The most common place is the big toe. Other joints that may be affected include joints of the feet, ankle, knee, fingers, wrist, or elbow. Symptoms may include: Very bad pain. Warmth. Swelling. Stiffness. Tenderness.  The affected joint may be very painful to touch. Shiny, red, or purple skin. Chills and fever. Chronic gout may cause symptoms more often. More joints may be involved. You may also have white or yellow lumps (tophi) on your hands or feet or in other areas near your joints. How is this treated? Treatment for an acute attack may include medicines for pain and swelling, such as: NSAIDs, such as ibuprofen. Steroids taken by mouth or injected into a joint. Colchicine. This can be given by mouth or through an IV tube. Treatment to prevent future attacks may include: Taking small doses of NSAIDs or colchicine daily. Using a medicine that reduces uric acid levels in your blood, such as allopurinol. Making changes to your diet. You may need to see a food expert (dietitian) about what to eat and drink to prevent gout. Follow these instructions at home: During a gout attack  If told, put ice on the painful area. To do this: Put ice in a plastic bag. Place a towel between your skin and the bag. Leave the ice on for 20 minutes, 2-3 times a day. Take off the ice if your skin turns bright red. This is very important. If you cannot feel pain, heat, or cold, you have a greater risk of damage to the area. Raise the painful joint above the level of your heart as often as you can. Rest the joint as much as possible. If the joint is in your leg, you may be given crutches. Follow instructions  from your doctor about what you cannot eat or drink. Avoiding future gout attacks Eat a low-purine diet. Avoid foods and drinks such as: Liver. Kidney. Anchovies. Asparagus. Herring. Mushrooms. Mussels. Beer. Stay at a healthy weight. If you want to lose weight, talk with your doctor. Do not lose weight too fast. Start or continue an exercise plan as told by your doctor. Eating and drinking Avoid drinks sweetened by fructose. Drink enough fluids to keep your pee (urine) pale yellow. If you drink alcohol: Limit  how much you have to: 0-1 drink a day for women who are not pregnant. 0-2 drinks a day for men. Know how much alcohol is in a drink. In the [place] one drink equals one 12 oz bottle of beer (355 mL), one 5 oz glass of wine (148 mL), or one 1 oz glass of hard liquor (44 mL). General instructions Take over-the-counter and prescription medicines only as told by your doctor. Ask your doctor if you should avoid driving or using machines while you are taking your medicine. Return to your normal activities when your doctor says that it is safe. Keep all follow-up visits. Where to find more information [employer]: www.niams.http://www.myers.net/ Contact a doctor if: You have another gout attack. You still have symptoms of a gout attack after 10 days of treatment. You have problems (side effects) because of your medicines. You have chills or a fever. You have burning pain when you pee (urinate). You have pain in your lower back or belly. Get help right away if: You have very bad pain. Your pain cannot be controlled. You cannot pee. Summary Gout is painful swelling of the joints. The most common site of pain is the big toe, but it can affect other joints. Medicines and avoiding some foods can help to prevent and treat gout attacks. This information is not intended to replace advice given to you by your health care provider. Make sure you discuss any questions you have with your health care provider. Document Revised: 11/20/2020 Document Reviewed: 11/20/2020 Elsevier Patient Education  2024 [employer].

## 2023-04-27 NOTE — Progress Notes (Signed)
 Subjective:    Patient ID: Bailey [name], female    DOB: 1950-05-22, 73 y.o.   MRN: [id number]      HPI Bailey [name] is here for  Chief Complaint  Patient presents with   Left Foot Pain    Since Thursday 2/20     3 weeks ago went on a cruise - walking, eating more than usual.  Before she went on the cruise she had her nails and toes done-polished.  She did not have any pain on the cruise, but always wore good footwear.   Started last Wednesday she noticed that her left foot was more swollen and red near her bunion.  She was not sure if it was irritated from the cruise from all the walking.  The area was tender.  Her symptoms started slowly and got worse.   Took advil/Tylenol combo -- regular Tylenol helped her more.  Put blue emu cream on it.  Did Epson salt cool baths since sat.  Her symptoms have gotten slightly better.  She did have some chills yesterday, but denies any fevers.  Her husband was concerned maybe she had gout which she has never had in the past.  She does not drink much alcohol.  She denies any significant amounts of seafood on the cruise.    Medications and allergies reviewed with patient and updated if appropriate.  Current Outpatient Medications on File Prior to Visit  Medication Sig Dispense Refill   fluorouracil (EFUDEX) 5 % cream      olmesartan (BENICAR) 20 MG tablet Take 1 tablet (20 mg total) by mouth daily. Take at HS     triamcinolone ointment (KENALOG) 0.1 % Apply 1 Application topically 2 (two) times daily as needed (Rash). 453.6 g 0   vitamin B-12 (V-R VITAMIN B-12) 500 MCG tablet Take 1 tablet (500 mcg total) by mouth daily. 100 tablet 3   amLODipine-olmesartan (AZOR) 5-20 MG tablet Take 1 tablet by mouth daily. Take in am (Patient not taking: Reported on 04/27/2023)     azithromycin (ZITHROMAX Z-PAK) 250 MG tablet As directed (Patient not taking: Reported on 04/27/2023) 6 tablet 0   Cholecalciferol (VITAMIN D3) 50 MCG (2000 UT) capsule Take 1  capsule (2,000 Units total) by mouth daily. 100 capsule 3   hydrALAZINE (APRESOLINE) 25 MG tablet Take 1 tablet (25 mg total) by mouth 3 (three) times daily as needed. (Patient not taking: Reported on 04/27/2023) 30 tablet 0   phenazopyridine (PYRIDIUM) 95 MG tablet Take by mouth. (Patient not taking: Reported on 04/27/2023)     Semaglutide-Weight Management (WEGOVY) 0.25 MG/0.5ML SOAJ INJECT 0.25MG  INTO THE SKIN ONE TIME PER WEEK (Patient not taking: Reported on 04/27/2023) 3 mL 2   No current facility-administered medications on file prior to visit.    Review of Systems     Objective:   Vitals:   04/27/23 1537  BP: 130/80  Pulse: 60  SpO2: 99%   BP Readings from Last 3 Encounters:  04/27/23 130/80  02/16/23 (!) 141/91  02/15/23 130/84   Wt Readings from Last 3 Encounters:  04/27/23 192 lb 9.6 oz (87.4 kg)  02/15/23 198 lb (89.8 kg)  11/10/22 200 lb (90.7 kg)   Body mass index is 30.62 kg/m.    Physical Exam Constitutional:      General: She is not in acute distress.    Appearance: Normal appearance. She is not ill-appearing.  HENT:     Head: Normocephalic and atraumatic.  Musculoskeletal:  Comments: Left first MTP joint is swollen, erythematous, warm.  Increased pain with passive and active movement.  Increased pain with applying pressure on the joint and walking.  Skin:    General: Skin is warm and dry.  Neurological:     Mental Status: She is alert.            Assessment & Plan:    See Problem List for Assessment and Plan of chronic medical problems.

## 2023-05-12 ENCOUNTER — Telehealth: Payer: Self-pay | Admitting: Internal Medicine

## 2023-05-12 NOTE — Telephone Encounter (Signed)
 Copied from CRM [id number]. Topic: General - Other >> May 12, 2023 11:58 AM Pascal Lux wrote: Reason for CRM: Patient is requesting a call back from the nurse regarding a grant to get your medication at a lower cost.

## 2023-05-14 NOTE — Telephone Encounter (Signed)
 Tried to call pt. I am going to forward this information for pt needing medications at lower cost to our pharmacist on site.

## 2023-05-17 ENCOUNTER — Encounter: Payer: Self-pay | Admitting: Internal Medicine

## 2023-05-17 ENCOUNTER — Ambulatory Visit (INDEPENDENT_AMBULATORY_CARE_PROVIDER_SITE_OTHER): Payer: Medicare Other | Admitting: Internal Medicine

## 2023-05-17 VITALS — BP 134/80 | HR 66 | Temp 98.3°F | Ht 66.5 in | Wt 194.0 lb

## 2023-05-17 DIAGNOSIS — E559 Vitamin D deficiency, unspecified: Secondary | ICD-10-CM | POA: Diagnosis not present

## 2023-05-17 DIAGNOSIS — R739 Hyperglycemia, unspecified: Secondary | ICD-10-CM

## 2023-05-17 DIAGNOSIS — I10 Essential (primary) hypertension: Secondary | ICD-10-CM | POA: Diagnosis not present

## 2023-05-17 DIAGNOSIS — M10072 Idiopathic gout, left ankle and foot: Secondary | ICD-10-CM

## 2023-05-17 DIAGNOSIS — E785 Hyperlipidemia, unspecified: Secondary | ICD-10-CM | POA: Diagnosis not present

## 2023-05-17 LAB — COMPREHENSIVE METABOLIC PANEL
ALT: 16 U/L (ref 0–35)
AST: 15 U/L (ref 0–37)
Albumin: 4.5 g/dL (ref 3.5–5.2)
Alkaline Phosphatase: 74 U/L (ref 39–117)
BUN: 12 mg/dL (ref 6–23)
CO2: 29 meq/L (ref 19–32)
Calcium: 10.5 mg/dL (ref 8.4–10.5)
Chloride: 103 meq/L (ref 96–112)
Creatinine, Ser: 0.71 mg/dL (ref 0.40–1.20)
GFR: 84.74 mL/min (ref >60.00)
Glucose, Bld: 104 mg/dL — ABNORMAL HIGH (ref 70–99)
Potassium: 4.5 meq/L (ref 3.5–5.1)
Sodium: 138 meq/L (ref 135–145)
Total Bilirubin: 0.8 mg/dL (ref 0.2–1.2)
Total Protein: 7.4 g/dL (ref 6.0–8.3)

## 2023-05-17 LAB — URIC ACID: Uric Acid, Serum: 6.8 mg/dL (ref 2.4–7.0)

## 2023-05-17 LAB — HEMOGLOBIN A1C: Hgb A1c MFr Bld: 6 % (ref 4.6–6.5)

## 2023-05-17 MED ORDER — WEGOVY 0.25 MG/0.5ML ~~LOC~~ SOAJ: 0.2500 mg | SUBCUTANEOUS | 3 refills | Status: DC

## 2023-05-17 NOTE — Progress Notes (Signed)
 Subjective:  Patient ID: Bailey [name], female    DOB: 1950-12-22  Age: 73 y.o. MRN: [id number]  CC: Medical Management of Chronic Issues (3 mnth f/u)   HPI Bailey [name] presents for recent gout    Outpatient Medications Prior to Visit  Medication Sig Dispense Refill   Cholecalciferol (VITAMIN D3) 50 MCG (2000 UT) capsule Take 1 capsule (2,000 Units total) by mouth daily. 100 capsule 3   fluorouracil (EFUDEX) 5 % cream      hydrALAZINE (APRESOLINE) 25 MG tablet Take 1 tablet (25 mg total) by mouth 3 (three) times daily as needed. 30 tablet 0   olmesartan (BENICAR) 20 MG tablet Take 1 tablet (20 mg total) by mouth daily. Take at HS     phenazopyridine (PYRIDIUM) 95 MG tablet Take by mouth.     predniSONE (DELTASONE) 10 MG tablet 3 tabs po qd x 3 days, then 2 tabs po qd x 3 days, then 1 tab po qd x 3 days 18 tablet 0   Semaglutide-Weight Management (WEGOVY) 0.25 MG/0.5ML SOAJ INJECT 0.25MG  INTO THE SKIN ONE TIME PER WEEK 3 mL 2   triamcinolone ointment (KENALOG) 0.1 % Apply 1 Application topically 2 (two) times daily as needed (Rash). 453.6 g 0   vitamin B-12 (V-R VITAMIN B-12) 500 MCG tablet Take 1 tablet (500 mcg total) by mouth daily. 100 tablet 3   amLODipine-olmesartan (AZOR) 5-20 MG tablet Take 1 tablet by mouth daily. Take in am (Patient not taking: Reported on 02/16/2023)     azithromycin (ZITHROMAX Z-PAK) 250 MG tablet As directed (Patient not taking: Reported on 05/17/2023) 6 tablet 0   No facility-administered medications prior to visit.    ROS: Review of Systems  Objective:  BP 134/80   Pulse 66   Temp 98.3 F (36.8 C) (Oral)   Ht 5' 6.5" (1.689 m)   Wt 194 lb (88 kg)   SpO2 97%   BMI 30.84 kg/m   BP Readings from Last 3 Encounters:  05/17/23 134/80  04/27/23 130/80  02/16/23 (!) 141/91    Wt Readings from Last 3 Encounters:  05/17/23 194 lb (88 kg)  04/27/23 192 lb 9.6 oz (87.4 kg)  02/15/23 198 lb (89.8 kg)    Physical Exam  Lab Results   Component Value Date   WBC 6.8 10/22/2022   HGB 14.2 10/22/2022   HCT 41.4 10/22/2022   PLT 222 10/22/2022   GLUCOSE 94 02/10/2023   CHOL 227 (H) 01/27/2021   TRIG 91.0 01/27/2021   HDL 50.90 01/27/2021   LDLDIRECT 132.0 07/10/2019   LDLCALC 158 (H) 01/27/2021   ALT 14 02/10/2023   AST 14 02/10/2023   NA 138 02/10/2023   K 4.4 02/10/2023   CL 105 02/10/2023   CREATININE 0.66 02/10/2023   BUN 18 02/10/2023   CO2 29 02/10/2023   TSH 1.23 09/28/2022   INR 1.0 08/07/2019   HGBA1C 5.9 02/10/2023    MR BRAIN WO CONTRAST Result Date: 10/23/2022 CLINICAL DATA:  73 year old female who presented with chest pain and hypertension. Off balance, neurologic deficit. EXAM: MRI HEAD WITHOUT CONTRAST TECHNIQUE: Multiplanar, multiecho pulse sequences of the brain and surrounding structures were obtained without intravenous contrast. COMPARISON:  None Available. FINDINGS: Brain: Overall cerebral volume appears within normal limits for age. No restricted diffusion to suggest acute infarction. No midline shift, mass effect, evidence of mass lesion, ventriculomegaly, extra-axial collection or acute intracranial hemorrhage. Cervicomedullary junction and pituitary are within normal limits. No cortical encephalomalacia or  chronic cerebral blood products. Mild for age scattered small cerebral white matter T2 and FLAIR hyperintense foci in a nonspecific configuration. Minimal T2 heterogeneity in the deep gray nuclei, otherwise normal along with the brainstem and cerebellum. Vascular: Major intracranial vascular flow voids are preserved. Distal left vertebral artery appears mildly dominant. Mild intracranial artery tortuosity. Skull and upper cervical spine: Scaphocephaly, normal variant. Visualized bone marrow signal is within normal limits. Normal visible cervical spine. Sinuses/Orbits: Negative orbits. Mild paranasal sinus mucosal thickening, primarily in the right sphenoid. No sinus fluid levels identified. Other:  Mastoids are well aerated. Visible internal auditory structures appear normal. Negative visible scalp and face. IMPRESSION: 1. No acute intracranial abnormality and mostly unremarkable noncontrast brain MRI for age. 2. Mild for age white matter signal changes, most commonly due to chronic small vessel disease. Electronically Signed   By: Odessa Fleming M.D.   On: 10/23/2022 05:46   DG Chest 2 View Result Date: 10/22/2022 CLINICAL DATA:  Chest pain EXAM: CHEST - 2 VIEW COMPARISON:  Abdominal series 06/01/2004 FINDINGS: The heart size and mediastinal contours are within normal limits. Both lungs are clear. Cervical spinal fusion plate is present. No acute fracture identified. IMPRESSION: No active cardiopulmonary disease. Electronically Signed   By: Darliss Cheney M.D.   On: 10/22/2022 23:35    Assessment & Plan:   Problem List Items Addressed This Visit   None     No orders of the defined types were placed in this encounter.     Follow-up: No follow-ups on file.  Sonda Primes, MD

## 2023-05-17 NOTE — Assessment & Plan Note (Signed)
 BP Readings from Last 3 Encounters:  05/17/23 134/80  04/27/23 130/80  02/16/23 (!) 141/91  Nl BP Olmesartan/Amlod 20/5 q am and Olmesart 20 mg at bedtime Ref to Med Nutrition

## 2023-05-17 NOTE — Assessment & Plan Note (Signed)
 Check A1c.

## 2023-05-17 NOTE — Assessment & Plan Note (Signed)
 On Vit D

## 2023-05-17 NOTE — Assessment & Plan Note (Signed)
 Chronic  Statin intolerant Fish oil

## 2023-05-17 NOTE — Assessment & Plan Note (Addendum)
 Resolved - L foot Check uric acid Pt declined meds

## 2023-05-18 ENCOUNTER — Encounter: Payer: Self-pay | Admitting: Internal Medicine

## 2023-05-25 ENCOUNTER — Ambulatory Visit (INDEPENDENT_AMBULATORY_CARE_PROVIDER_SITE_OTHER): Payer: Medicare Other

## 2023-05-25 VITALS — Ht 65.5 in | Wt 194.0 lb

## 2023-05-25 DIAGNOSIS — Z1231 Encounter for screening mammogram for malignant neoplasm of breast: Secondary | ICD-10-CM

## 2023-05-25 DIAGNOSIS — Z0000 Encounter for general adult medical examination without abnormal findings: Secondary | ICD-10-CM | POA: Diagnosis not present

## 2023-05-25 NOTE — Patient Instructions (Addendum)
 Bailey [name] , Thank you for taking time to come for your Medicare Wellness Visit. I appreciate your ongoing commitment to your health goals. Please review the following plan we discussed and let me know if I can assist you in the future.   Referrals/Orders/Follow-Ups/Clinician Recommendations: Aim for 30 minutes of exercise or brisk walking, 6-8 glasses of water, and 5 servings of fruits and vegetables each day. Order placed for Mammogram.  This is a list of the screening recommended for you and due dates:  Health Maintenance  Topic Date Due   Hepatitis C Screening  Never done   Pneumonia Vaccine (1 of 1 - PCV) Never done   COVID-19 Vaccine (3 - Pfizer risk series) 08/25/2019   DTaP/Tdap/Td vaccine (3 - Td or Tdap) 03/02/2022   Mammogram  11/26/2023   Medicare Annual Wellness Visit  05/24/2024   Colon Cancer Screening  06/10/2029   DEXA scan (bone density measurement)  Completed   HPV Vaccine  Aged Out   Flu Shot  Discontinued   Zoster (Shingles) Vaccine  Discontinued    Advanced directives: (Copy Requested) Please bring a copy of your health care power of attorney and living will to the office to be added to your chart at your convenience. You can mail to [facility] [address] [place], [place] [address] or email to [email or web address]  Next Medicare Annual Wellness Visit scheduled for next year: Yes

## 2023-05-25 NOTE — Progress Notes (Cosign Needed Addendum)
 Subjective:   Bailey [name] is a 73 y.o. who presents for a Medicare Wellness preventive visit.  Visit Complete: Virtual I connected with  [name] on 05/25/23 by a audio enabled telemedicine application and verified that I am speaking with the correct person using two identifiers.  Patient Location: Home  Provider Location: Office/Clinic  I discussed the limitations of evaluation and management by telemedicine. The patient expressed understanding and agreed to proceed.  Vital Signs: Because this visit was a virtual/telehealth visit, some criteria may be missing or patient reported. Any vitals not documented were not able to be obtained and vitals that have been documented are patient reported.  VideoDeclined- This patient declined Librarian, academic. Therefore the visit was completed with audio only.  Persons Participating in Visit: Patient.  AWV Questionnaire: Yes: Patient Medicare AWV questionnaire was completed by the patient on 05/25/2023; I have confirmed that all information answered by patient is correct and no changes since this date.  Cardiac Risk Factors include: advanced age (>67men, >36 women);hypertension;dyslipidemia;obesity (BMI >30kg/m2)     Objective:    Today's Vitals   05/25/23 0849  Weight: 194 lb (88 kg)  Height: 5' 5.5" (1.664 m)   Body mass index is 31.79 kg/m.     05/25/2023    8:44 AM 10/30/2022    3:37 PM 10/22/2022   11:22 PM 05/21/2022    8:52 AM 05/19/2021   11:08 AM 05/02/2020    1:30 PM 08/14/2019   12:00 PM  Advanced Directives  Does Patient Have a Medical Advance Directive? Yes Yes No Yes Yes Yes Yes  Type of Estate agent of [place];Living will Healthcare Power of [place];Living will  Healthcare Power of [place];Living will Living will;Healthcare Power of Attorney Living will;Healthcare Power of [employer] Power of [place];Living will  Does patient want to make changes to  medical advance directive?     No - Patient declined No - Patient declined No - Patient declined  Copy of Healthcare Power of Attorney in Chart? No - copy requested   No - copy requested No - copy requested No - copy requested     Current Medications (verified) Outpatient Encounter Medications as of 05/25/2023  Medication Sig   Cholecalciferol (VITAMIN D3) 50 MCG (2000 UT) capsule Take 1 capsule (2,000 Units total) by mouth daily.   fluorouracil (EFUDEX) 5 % cream    hydrALAZINE (APRESOLINE) 25 MG tablet Take 1 tablet (25 mg total) by mouth 3 (three) times daily as needed.   olmesartan (BENICAR) 20 MG tablet Take 1 tablet (20 mg total) by mouth daily. Take at HS   phenazopyridine (PYRIDIUM) 95 MG tablet Take by mouth.   Semaglutide-Weight Management (WEGOVY) 0.25 MG/0.5ML SOAJ Inject 0.25 mg into the skin once a week.   triamcinolone ointment (KENALOG) 0.1 % Apply 1 Application topically 2 (two) times daily as needed (Rash).   vitamin B-12 (V-R VITAMIN B-12) 500 MCG tablet Take 1 tablet (500 mcg total) by mouth daily.   [DISCONTINUED] azithromycin (ZITHROMAX Z-PAK) 250 MG tablet As directed   [DISCONTINUED] predniSONE (DELTASONE) 10 MG tablet 3 tabs po qd x 3 days, then 2 tabs po qd x 3 days, then 1 tab po qd x 3 days   [DISCONTINUED] amLODipine-olmesartan (AZOR) 5-20 MG tablet Take 1 tablet by mouth daily. Take in am (Patient not taking: Reported on 02/16/2023)   No facility-administered encounter medications on file as of 05/25/2023.    Allergies (verified) Codeine, Hctz [hydrochlorothiazide],  and Statins   History: Past Medical History:  Diagnosis Date   Actinic keratosis    Anemia    Arthritis    Arthritis of right knee    Borderline diabetes    Burning sensation of feet    Colitis    hx colitis-gi bleed-2006   Constipation    DYSLIPIDEMIA    Gallbladder problem    GI bleed    January 2024   Headache    hx of 10 years ago   Hx of cardiovascular stress test    Lexiscan  Myoview 9/14:  Small, fixed apical anterior perfusion defect (worse at rest) - probable soft tissue attenuation, EF 61%, low risk study   HYPERTENSION    Lower extremity edema    Partial tear of subscapularis tendon 04/22/2012   POSTMENOPAUSAL STATUS    Pre-diabetes    Supraspinatus tendon tear 04/22/2012   Tubular adenoma of colon 08/2013   Vitamin D deficiency    Past Surgical History:  Procedure Laterality Date   ABDOMINAL HYSTERECTOMY  1987   Partial   APPENDECTOMY  2003   CERVICAL FUSION  2002   COLONOSCOPY  06/03/04   isch colitis (hosp for same)   FRACTURE SURGERY     leg ? right  73 years old   HERNIA REPAIR  1975   umb   SHOULDER ARTHROSCOPY WITH ROTATOR CUFF REPAIR AND SUBACROMIAL DECOMPRESSION Right 04/22/2012   Procedure: SHOULDER ARTHROSCOPY WITH ROTATOR CUFF REPAIR AND SUBACROMIAL DECOMPRESSION;  Surgeon: Eulas Post, MD;  Location: [facility];  Service: Orthopedics;  Laterality: Right;  RIGHT SHOULDER ARTHROSCOPY DEBRIDEMENT LIMITED, DECOMPRESSION SUBACROMIAL PARTIAL ACROMIOPLASTY WITH CORACOACROMIAL RELEASE, WITH ROTATOR CUFF REPAIR AND SUBSCAPULARIS REPAIR   TOTAL KNEE ARTHROPLASTY Right 08/14/2019   Procedure: TOTAL KNEE ARTHROPLASTY;  Surgeon: Salvatore Marvel, MD;  Location: [facility];  Service: Orthopedics;  Laterality: Right;   TUBAL LIGATION     Family History  Problem Relation Age of Onset   Hypertension Father    Stroke Father    Heart disease Father    Aneurysm Mother        brain   Stroke Mother    Stroke Sister    Hypertension Sister    Diabetes Brother    Colon cancer Neg Hx    Esophageal cancer Neg Hx    Rectal cancer Neg Hx    Stomach cancer Neg Hx    Social History   Socioeconomic History   Marital status: Married    Spouse name: Bluford Main   Number of children: Not on file   Years of education: Not on file   Highest education level: 12th grade  Occupational History   Not on file  Tobacco Use   Smoking status: Never     Passive exposure: Never   Smokeless tobacco: Never   Tobacco comments:    exposed to second hand (spouse smokes), Married  Vaping Use   Vaping status: Never Used  Substance and Sexual Activity   Alcohol use: No   Drug use: No   Sexual activity: Yes    Birth control/protection: Surgical  Other Topics Concern   Not on file  Social History Narrative   Not on file   Social Drivers of Health   Financial Resource Strain: Low Risk  (05/25/2023)   Overall Financial Resource Strain (CARDIA)    Difficulty of Paying Living Expenses: Not hard at all  Food Insecurity: No Food Insecurity (05/25/2023)   Hunger Vital Sign  Worried About Programme researcher, broadcasting/film/video in the Last Year: Never true    Ran Out of Food in the Last Year: Never true  Transportation Needs: No Transportation Needs (05/25/2023)   PRAPARE - Administrator, Civil Service (Medical): No    Lack of Transportation (Non-Medical): No  Physical Activity: Sufficiently Active (05/25/2023)   Exercise Vital Sign    Days of Exercise per Week: 4 days    Minutes of Exercise per Session: 60 min  Stress: No Stress Concern Present (05/25/2023)   [employer] of Occupational Health - Occupational Stress Questionnaire    Feeling of Stress : Not at all  Social Connections: Socially Integrated (05/25/2023)   Social Connection and Isolation Panel [NHANES]    Frequency of Communication with Friends and Family: More than three times a week    Frequency of Social Gatherings with Friends and Family: Three times a week    Attends Religious Services: More than 4 times per year    Active Member of Clubs or Organizations: Yes    Attends Engineer, structural: More than 4 times per year    Marital Status: Married    Tobacco Counseling Counseling given: No Tobacco comments: exposed to second hand (spouse smokes), Married    Clinical Intake:  Pre-visit preparation completed: Yes  Pain : No/denies pain     BMI - recorded:  31.79 Nutritional Status: BMI > 30  Obese Nutritional Risks: None Diabetes: No  Lab Results  Component Value Date   HGBA1C 6.0 05/17/2023   HGBA1C 5.9 02/10/2023   HGBA1C 5.9 12/01/2022     How often do you need to have someone help you when you read instructions, pamphlets, or other written materials from your doctor or pharmacy?: 1 - Never  Interpreter Needed?: No  Information entered by :: Saidah Kempton,CMA   Activities of Daily Living     05/25/2023    8:51 AM 05/24/2023    9:54 AM  In your present state of health, do you have any difficulty performing the following activities:  Hearing? 0 0  Vision? 0 0  Difficulty concentrating or making decisions? 0 0  Walking or climbing stairs? 0 0  Dressing or bathing? 0 0  Doing errands, shopping? 0 0  Preparing Food and eating ? N N  Using the Toilet? N N  In the past six months, have you accidently leaked urine? Malvin Johns  Comment wears a pad at night   Do you have problems with loss of bowel control? N N  Managing your Medications? N N  Managing your Finances? N N  Housekeeping or managing your Housekeeping? N N    Patient Care Team: Plotnikov, Georgina Quint, MD as PCP - General (Internal Medicine) Teryl Lucy, MD (Orthopedic Surgery) Meryl Dare, MD (Inactive) (Gastroenterology) [facility], [facility] McMichael, Salomon Fick, MD (Dermatology) Salvatore Marvel, MD as Consulting Physician (Orthopedic Surgery) Janet Berlin, MD as Consulting Physician (Ophthalmology) Jerrell Mylar, MD as Referring Physician (Gynecology)  Indicate any recent Medical Services you may have received from other than [facility] providers in the past year (date may be approximate).     Assessment:   This is a routine wellness examination for McBride.  Hearing/Vision screen Hearing Screening - Comments:: Denies hearing difficulties   Vision Screening - Comments:: Wears eyeglasses for reading only - Dr Burgess Estelle   Goals Addressed                This Visit's Progress  Patient Stated (pt-stated)        Patient stated she wants to lose weight (about 50lbs).       Depression Screen     05/25/2023    8:56 AM 02/15/2023   11:15 AM 09/28/2022    9:32 AM 05/28/2022   11:04 AM 05/21/2022    8:57 AM 05/19/2021   11:07 AM 05/05/2021    2:40 PM  PHQ 2/9 Scores  PHQ - 2 Score 0 0 0 0 0 0 0  PHQ- 9 Score 0   0 0      Fall Risk     05/25/2023    8:53 AM 05/24/2023    9:54 AM 02/15/2023   11:15 AM 09/28/2022    9:32 AM 05/28/2022   11:04 AM  Fall Risk   Falls in the past year? 0 0 0 0 0  Number falls in past yr: 0  0 0 0  Injury with Fall? 0  0 0 0  Risk for fall due to : No Fall Risks  No Fall Risks No Fall Risks No Fall Risks  Follow up Falls evaluation completed;Falls prevention discussed  Falls evaluation completed Falls evaluation completed Falls evaluation completed    MEDICARE RISK AT HOME:  Medicare Risk at Home Any stairs in or around the home?: Yes If so, are there any without handrails?: No Home free of loose throw rugs in walkways, pet beds, electrical cords, etc?: Yes (outside) Adequate lighting in your home to reduce risk of falls?: Yes Life alert?: No Use of a cane, walker or w/c?: No Grab bars in the bathroom?: Yes Shower chair or bench in shower?: Yes Elevated toilet seat or a handicapped toilet?: Yes  TIMED UP AND GO:  Was the test performed?  No  Cognitive Function: 6CIT completed        05/25/2023    8:53 AM 05/21/2022    9:17 AM 08/17/2016   11:46 AM  6CIT Screen  What Year? 0 points 0 points 0 points  What month? 0 points 0 points 0 points  What time? 0 points 0 points 0 points  Count back from 20 0 points 0 points 0 points  Months in reverse 2 points 0 points 0 points  Repeat phrase 0 points 0 points 4 points  Total Score 2 points 0 points 4 points    Immunizations Immunization History  Administered Date(s) Administered   PFIZER(Purple Top)SARS-COV-2 Vaccination 07/07/2019,  07/28/2019   Td 01/04/2009   Tdap 03/02/2012    Screening Tests Health Maintenance  Topic Date Due   Hepatitis C Screening  Never done   Pneumonia Vaccine 40+ Years old (1 of 1 - PCV) Never done   COVID-19 Vaccine (3 - Pfizer risk series) 08/25/2019   DTaP/Tdap/Td (3 - Td or Tdap) 03/02/2022   MAMMOGRAM  11/26/2023   Medicare Annual Wellness (AWV)  05/24/2024   Colonoscopy  06/10/2029   DEXA SCAN  Completed   HPV VACCINES  Aged Out   INFLUENZA VACCINE  Discontinued   Zoster Vaccines- Shingrix  Discontinued    Health Maintenance  Health Maintenance Due  Topic Date Due   Hepatitis C Screening  Never done   Pneumonia Vaccine 31+ Years old (1 of 1 - PCV) Never done   COVID-19 Vaccine (3 - Pfizer risk series) 08/25/2019   DTaP/Tdap/Td (3 - Td or Tdap) 03/02/2022   Health Maintenance Items Addressed: 05/25/2023 Mammogram ordered  Additional Screening:  Vision Screening: Recommended annual ophthalmology exams for early  detection of glaucoma and other disorders of the eye.  Dental Screening: Recommended annual dental exams for proper oral hygiene  Community Resource Referral / Chronic Care Management: CRR required this visit?  No   CCM required this visit?  No     Plan:     I have personally reviewed and noted the following in the patient's chart:   Medical and social history Use of alcohol, tobacco or illicit drugs  Current medications and supplements including opioid prescriptions. Patient is not currently taking opioid prescriptions. Functional ability and status Nutritional status Physical activity Advanced directives List of other physicians Hospitalizations, surgeries, and ER visits in previous 12 months Vitals Screenings to include cognitive, depression, and falls Referrals and appointments  In addition, I have reviewed and discussed with patient certain preventive protocols, quality metrics, and best practice recommendations. A written personalized care  plan for preventive services as well as general preventive health recommendations were provided to patient.     Darreld Mclean, CMA   05/25/2023   After Visit Summary: (MyChart) Due to this being a telephonic visit, the after visit summary with patients personalized plan was offered to patient via MyChart   Notes: Nothing significant to report at this time.  Medical screening examination/treatment/procedure(s) were performed by non-physician practitioner and as supervising physician I was immediately available for consultation/collaboration.  I agree with above. Jacinta Shoe, MD

## 2023-06-09 DIAGNOSIS — H2513 Age-related nuclear cataract, bilateral: Secondary | ICD-10-CM | POA: Diagnosis not present

## 2023-06-09 DIAGNOSIS — H43813 Vitreous degeneration, bilateral: Secondary | ICD-10-CM | POA: Diagnosis not present

## 2023-06-23 DIAGNOSIS — M545 Low back pain, unspecified: Secondary | ICD-10-CM | POA: Diagnosis not present

## 2023-06-23 DIAGNOSIS — M7062 Trochanteric bursitis, left hip: Secondary | ICD-10-CM | POA: Diagnosis not present

## 2023-06-23 DIAGNOSIS — M25572 Pain in left ankle and joints of left foot: Secondary | ICD-10-CM | POA: Diagnosis not present

## 2023-07-06 DIAGNOSIS — M7062 Trochanteric bursitis, left hip: Secondary | ICD-10-CM | POA: Diagnosis not present

## 2023-07-06 DIAGNOSIS — M6281 Muscle weakness (generalized): Secondary | ICD-10-CM | POA: Diagnosis not present

## 2023-07-06 DIAGNOSIS — M1712 Unilateral primary osteoarthritis, left knee: Secondary | ICD-10-CM | POA: Diagnosis not present

## 2023-07-12 DIAGNOSIS — M6281 Muscle weakness (generalized): Secondary | ICD-10-CM | POA: Diagnosis not present

## 2023-07-12 DIAGNOSIS — M7062 Trochanteric bursitis, left hip: Secondary | ICD-10-CM | POA: Diagnosis not present

## 2023-07-12 DIAGNOSIS — M1712 Unilateral primary osteoarthritis, left knee: Secondary | ICD-10-CM | POA: Diagnosis not present

## 2023-07-20 DIAGNOSIS — M7062 Trochanteric bursitis, left hip: Secondary | ICD-10-CM | POA: Diagnosis not present

## 2023-07-20 DIAGNOSIS — M6281 Muscle weakness (generalized): Secondary | ICD-10-CM | POA: Diagnosis not present

## 2023-07-20 DIAGNOSIS — M1712 Unilateral primary osteoarthritis, left knee: Secondary | ICD-10-CM | POA: Diagnosis not present

## 2023-07-27 DIAGNOSIS — M6281 Muscle weakness (generalized): Secondary | ICD-10-CM | POA: Diagnosis not present

## 2023-07-27 DIAGNOSIS — M7062 Trochanteric bursitis, left hip: Secondary | ICD-10-CM | POA: Diagnosis not present

## 2023-07-27 DIAGNOSIS — M1712 Unilateral primary osteoarthritis, left knee: Secondary | ICD-10-CM | POA: Diagnosis not present

## 2023-08-03 DIAGNOSIS — M7062 Trochanteric bursitis, left hip: Secondary | ICD-10-CM | POA: Diagnosis not present

## 2023-08-03 DIAGNOSIS — M1712 Unilateral primary osteoarthritis, left knee: Secondary | ICD-10-CM | POA: Diagnosis not present

## 2023-08-03 DIAGNOSIS — M6281 Muscle weakness (generalized): Secondary | ICD-10-CM | POA: Diagnosis not present

## 2023-08-11 DIAGNOSIS — M25572 Pain in left ankle and joints of left foot: Secondary | ICD-10-CM | POA: Diagnosis not present

## 2023-08-11 DIAGNOSIS — M7062 Trochanteric bursitis, left hip: Secondary | ICD-10-CM | POA: Diagnosis not present

## 2023-08-17 ENCOUNTER — Ambulatory Visit: Payer: Medicare Other | Admitting: Dermatology

## 2023-08-17 ENCOUNTER — Ambulatory Visit: Admitting: Internal Medicine

## 2023-08-17 DIAGNOSIS — M1712 Unilateral primary osteoarthritis, left knee: Secondary | ICD-10-CM | POA: Diagnosis not present

## 2023-08-17 DIAGNOSIS — M6281 Muscle weakness (generalized): Secondary | ICD-10-CM | POA: Diagnosis not present

## 2023-08-17 DIAGNOSIS — M7062 Trochanteric bursitis, left hip: Secondary | ICD-10-CM | POA: Diagnosis not present

## 2023-08-27 ENCOUNTER — Telehealth: Payer: Self-pay | Admitting: Pharmacist

## 2023-08-27 DIAGNOSIS — E66811 Obesity, class 1: Secondary | ICD-10-CM

## 2023-08-27 NOTE — Telephone Encounter (Signed)
 Spoke to patient regarding her brother and she asked to speak to me about an issue she has been having getting a medication. She has been trying to get Wegovy  for weight loss but has been unable to.   I explained to patient that Medicare does not cover Wegovy  or Zepbound for weight loss/obesity alone. They will only cover Wegovy  for CAD or Zepbound for moderate to severe OSA and she has neither of these diagnoses in her chart.  She mentions the urologist suggested she be tested for sleep apnea due to urinary incontinence issues she was having. I let her know I would ask Dr. Garald regarding the possibility to get a sleep study referral.  Darrelyn Drum, PharmD, BCPS, CPP Clinical Pharmacist Practitioner [facility] [phone]

## 2023-08-30 DIAGNOSIS — M1712 Unilateral primary osteoarthritis, left knee: Secondary | ICD-10-CM | POA: Diagnosis not present

## 2023-08-30 DIAGNOSIS — M7062 Trochanteric bursitis, left hip: Secondary | ICD-10-CM | POA: Diagnosis not present

## 2023-08-30 DIAGNOSIS — M6281 Muscle weakness (generalized): Secondary | ICD-10-CM | POA: Diagnosis not present

## 2023-09-09 ENCOUNTER — Ambulatory Visit: Payer: Self-pay | Admitting: *Deleted

## 2023-09-09 NOTE — Telephone Encounter (Signed)
 Copied from CRM [id number]. Topic: Clinical - Red Word Triage >> Sep 09, 2023 10:32 AM Bailey [name] wrote: Red Word that prompted transfer to Nurse Triage: Patient is still having pain in left foot that started in February, says she doesn't know what to do an requested a appt for today Reason for Disposition  [1] MODERATE pain (e.g., interferes with normal activities, limping) AND [2] present > 3 days  Answer Assessment - Initial Assessment Questions 1. ONSET: When did the pain start?      I'm having pain in my left foot since Feb.   2. LOCATION: Where is the pain located?      Left foot I've been renovating since the first of the year.   Been on my feet a lot with that.     I saw Dr. Geofm.   She said I had gout.   Prednisone  was given and it helped.   I've done PT too. I saw the ortho provider and he did an x ray.   I have a bad bunion on my left foot.    He talked about doing surgery for the bunion.   He put me on prednisone  again and it got better. It flared up again.   I've been on 3 prednisone  packs since my foot started flaring up.   I don't know what to do now.      3. PAIN: How bad is the pain?    (Scale 1-10; or mild, moderate, severe)     Over the past week the pain has gotten worse.    It's getting worse, the pain.    4. WORK OR EXERCISE: Has there been any recent work or exercise that involved this part of the body?      Just being on my feet a lot. 5. CAUSE: What do you think is causing the foot pain?     I guess it's the gout.   The whole bottom of my foot is swollen on the left.   6. OTHER SYMPTOMS: Do you have any other symptoms? (e.g., leg pain, rash, fever, numbness)     Pain 7. PREGNANCY: Is there any chance you are pregnant? When was your last menstrual period?     N/A due toage  Protocols used: Foot Pain-A-AH FYI Only or Action Required?: FYI only for provider.  Patient was last seen in primary care on 05/17/2023 by Plotnikov, Karlynn GAILS, MD.  Called  Nurse Triage reporting Foot Pain.  Symptoms began several months ago.  Interventions attempted: OTC medications: Advil and Tylenol .  Symptoms are: gradually worsening.  Triage Disposition: See PCP When Office is Open (Within 3 Days)  Patient/caregiver understands and will follow disposition?: Yes

## 2023-09-14 ENCOUNTER — Encounter: Payer: Self-pay | Admitting: Internal Medicine

## 2023-09-14 ENCOUNTER — Ambulatory Visit (INDEPENDENT_AMBULATORY_CARE_PROVIDER_SITE_OTHER): Admitting: Internal Medicine

## 2023-09-14 VITALS — BP 142/86 | HR 54 | Temp 97.8°F | Ht 65.5 in | Wt 196.0 lb

## 2023-09-14 DIAGNOSIS — M10072 Idiopathic gout, left ankle and foot: Secondary | ICD-10-CM | POA: Diagnosis not present

## 2023-09-14 DIAGNOSIS — M79672 Pain in left foot: Secondary | ICD-10-CM | POA: Diagnosis not present

## 2023-09-14 DIAGNOSIS — R7303 Prediabetes: Secondary | ICD-10-CM

## 2023-09-14 LAB — COMPREHENSIVE METABOLIC PANEL WITH GFR
ALT: 12 U/L (ref 0–35)
AST: 15 U/L (ref 0–37)
Albumin: 4.5 g/dL (ref 3.5–5.2)
Alkaline Phosphatase: 84 U/L (ref 39–117)
BUN: 17 mg/dL (ref 6–23)
CO2: 29 meq/L (ref 19–32)
Calcium: 10.4 mg/dL (ref 8.4–10.5)
Chloride: 104 meq/L (ref 96–112)
Creatinine, Ser: 0.72 mg/dL (ref 0.40–1.20)
GFR: 83.14 mL/min (ref >60.00)
Glucose, Bld: 91 mg/dL (ref 70–99)
Potassium: 4.1 meq/L (ref 3.5–5.1)
Sodium: 138 meq/L (ref 135–145)
Total Bilirubin: 0.5 mg/dL (ref 0.2–1.2)
Total Protein: 7.4 g/dL (ref 6.0–8.3)

## 2023-09-14 LAB — CBC WITH DIFFERENTIAL/PLATELET
Basophils Absolute: 0 K/uL (ref 0.0–0.1)
Basophils Relative: 0.8 % (ref 0.0–3.0)
Eosinophils Absolute: 0.1 K/uL (ref 0.0–0.7)
Eosinophils Relative: 1.8 % (ref 0.0–5.0)
HCT: 44 % (ref 36.0–46.0)
Hemoglobin: 15.1 g/dL — ABNORMAL HIGH (ref 12.0–15.0)
Lymphocytes Relative: 30.5 % (ref 12.0–46.0)
Lymphs Abs: 1.6 K/uL (ref 0.7–4.0)
MCHC: 34.2 g/dL (ref 30.0–36.0)
MCV: 93.2 fl (ref 78.0–100.0)
Monocytes Absolute: 0.5 K/uL (ref 0.1–1.0)
Monocytes Relative: 9.6 % (ref 3.0–12.0)
Neutro Abs: 3 K/uL (ref 1.4–7.7)
Neutrophils Relative %: 57.3 % (ref 43.0–77.0)
Platelets: 243 K/uL (ref 150.0–400.0)
RBC: 4.73 Mil/uL (ref 3.87–5.11)
RDW: 13.3 % (ref 11.5–15.5)
WBC: 5.2 K/uL (ref 4.0–10.5)

## 2023-09-14 LAB — SEDIMENTATION RATE: Sed Rate: 17 mm/h (ref 0–30)

## 2023-09-14 LAB — HEMOGLOBIN A1C: Hgb A1c MFr Bld: 6.2 % (ref 4.6–6.5)

## 2023-09-14 LAB — URIC ACID: Uric Acid, Serum: 8 mg/dL — ABNORMAL HIGH (ref 2.4–7.0)

## 2023-09-14 MED ORDER — CELECOXIB 200 MG PO CAPS: 200.0000 mg | ORAL_CAPSULE | Freq: Two times a day (BID) | ORAL | 1 refills | Status: DC

## 2023-09-14 NOTE — Assessment & Plan Note (Addendum)
 Possible gout vs other L foot 1st MTP pain - seeing Dr ONEIDA Chancy  Uric acid 6.8 Pt declined meds in the past Pt would like to see a podiatrist Celebrex  200 mg bid pc prn  Potential benefits of a long term NSAID  use as well as potential risks  and complications were explained to the patient and were aknowledged.

## 2023-09-14 NOTE — Assessment & Plan Note (Signed)
 Check A1c.

## 2023-09-14 NOTE — Progress Notes (Signed)
 Subjective:  Patient ID: Bailey [name], female    DOB: [birth year]  Age: 73 y.o. MRN: [id number]  CC: Foot Pain (Pt states she is having pain in her left foot that keeps flaring up a/w bottom of foot feeling swollen also causing a lot of pain as well.)   HPI Bailey [name] presents for gout, HTN, foot pain - seeing Dr ONEIDA Chancy  Outpatient Medications Prior to Visit  Medication Sig Dispense Refill   Cholecalciferol (VITAMIN D3) 50 MCG (2000 UT) capsule Take 1 capsule (2,000 Units total) by mouth daily. 100 capsule 3   fluorouracil  (EFUDEX ) 5 % cream      hydrALAZINE  (APRESOLINE ) 25 MG tablet Take 1 tablet (25 mg total) by mouth 3 (three) times daily as needed. 30 tablet 0   olmesartan  (BENICAR ) 20 MG tablet Take 1 tablet (20 mg total) by mouth daily. Take at HS     phenazopyridine  (PYRIDIUM ) 95 MG tablet Take by mouth.     Semaglutide -Weight Management (WEGOVY ) 0.25 MG/0.5ML SOAJ Inject 0.25 mg into the skin once a week. 3 mL 3   triamcinolone  ointment (KENALOG ) 0.1 % Apply 1 Application topically 2 (two) times daily as needed (Rash). 453.6 g 0   vitamin B-12 (V-R VITAMIN B-12) 500 MCG tablet Take 1 tablet (500 mcg total) by mouth daily. 100 tablet 3   No facility-administered medications prior to visit.    ROS: Review of Systems  Constitutional:  Negative for activity change, appetite change, chills, fatigue, fever and unexpected weight change.  HENT:  Negative for congestion, mouth sores and sinus pressure.   Eyes:  Negative for visual disturbance.  Respiratory:  Negative for cough and chest tightness.   Gastrointestinal:  Negative for abdominal pain and nausea.  Genitourinary:  Negative for difficulty urinating, frequency and vaginal pain.  Musculoskeletal:  Positive for gait problem. Negative for arthralgias and back pain.  Skin:  Negative for pallor and rash.  Neurological:  Negative for dizziness, tremors, weakness, numbness and headaches.  Psychiatric/Behavioral:  Negative  for confusion and sleep disturbance.     Objective:  BP (!) 142/86   Pulse (!) 54   Temp 97.8 F (36.6 C) (Oral)   Ht 5' 5.5 (1.664 m)   Wt 196 lb (88.9 kg)   SpO2 98%   BMI 32.12 kg/m   BP Readings from Last 3 Encounters:  09/14/23 (!) 142/86  05/17/23 134/80  04/27/23 130/80    Wt Readings from Last 3 Encounters:  09/14/23 196 lb (88.9 kg)  05/25/23 194 lb (88 kg)  05/17/23 194 lb (88 kg)    Physical Exam Constitutional:      General: She is not in acute distress.    Appearance: She is well-developed.  HENT:     Head: Normocephalic.     Right Ear: External ear normal.     Left Ear: External ear normal.     Nose: Nose normal.  Eyes:     General:        Right eye: No discharge.        Left eye: No discharge.     Conjunctiva/sclera: Conjunctivae normal.     Pupils: Pupils are equal, round, and reactive to light.  Neck:     Thyroid : No thyromegaly.     Vascular: No JVD.     Trachea: No tracheal deviation.  Cardiovascular:     Rate and Rhythm: Normal rate and regular rhythm.     Heart sounds: Normal heart sounds.  Pulmonary:  Effort: No respiratory distress.     Breath sounds: No stridor. No wheezing.  Abdominal:     General: Bowel sounds are normal. There is no distension.     Palpations: Abdomen is soft. There is no mass.     Tenderness: There is no abdominal tenderness. There is no guarding or rebound.  Musculoskeletal:        General: No tenderness.     Cervical back: Normal range of motion and neck supple. No rigidity.  Lymphadenopathy:     Cervical: No cervical adenopathy.  Skin:    Findings: No erythema or rash.  Neurological:     Mental Status: She is oriented to person, place, and time.     Cranial Nerves: No cranial nerve deficit.     Motor: No abnormal muscle tone.     Coordination: Coordination normal.     Gait: Gait abnormal.     Deep Tendon Reflexes: Reflexes normal.  Psychiatric:        Behavior: Behavior normal.        Thought  Content: Thought content normal.        Judgment: Judgment normal.    Limping  L 1st MTP w/redness, moderate pain w/ROM   Lab Results  Component Value Date   WBC 6.8 10/22/2022   HGB 14.2 10/22/2022   HCT 41.4 10/22/2022   PLT 222 10/22/2022   GLUCOSE 104 (H) 05/17/2023   CHOL 227 (H) 01/27/2021   TRIG 91.0 01/27/2021   HDL 50.90 01/27/2021   LDLDIRECT 132.0 07/10/2019   LDLCALC 158 (H) 01/27/2021   ALT 16 05/17/2023   AST 15 05/17/2023   NA 138 05/17/2023   K 4.5 05/17/2023   CL 103 05/17/2023   CREATININE 0.71 05/17/2023   BUN 12 05/17/2023   CO2 29 05/17/2023   TSH 1.23 09/28/2022   INR 1.0 08/07/2019   HGBA1C 6.0 05/17/2023    MR BRAIN WO CONTRAST Result Date: 10/23/2022 CLINICAL DATA:  73 year old female who presented with chest pain and hypertension. Off balance, neurologic deficit. EXAM: MRI HEAD WITHOUT CONTRAST TECHNIQUE: Multiplanar, multiecho pulse sequences of the brain and surrounding structures were obtained without intravenous contrast. COMPARISON:  None Available. FINDINGS: Brain: Overall cerebral volume appears within normal limits for age. No restricted diffusion to suggest acute infarction. No midline shift, mass effect, evidence of mass lesion, ventriculomegaly, extra-axial collection or acute intracranial hemorrhage. Cervicomedullary junction and pituitary are within normal limits. No cortical encephalomalacia or chronic cerebral blood products. Mild for age scattered small cerebral white matter T2 and FLAIR hyperintense foci in a nonspecific configuration. Minimal T2 heterogeneity in the deep gray nuclei, otherwise normal along with the brainstem and cerebellum. Vascular: Major intracranial vascular flow voids are preserved. Distal left vertebral artery appears mildly dominant. Mild intracranial artery tortuosity. Skull and upper cervical spine: Scaphocephaly, normal variant. Visualized bone marrow signal is within normal limits. Normal visible cervical spine.  Sinuses/Orbits: Negative orbits. Mild paranasal sinus mucosal thickening, primarily in the right sphenoid. No sinus fluid levels identified. Other: Mastoids are well aerated. Visible internal auditory structures appear normal. Negative visible scalp and face. IMPRESSION: 1. No acute intracranial abnormality and mostly unremarkable noncontrast brain MRI for age. 2. Mild for age white matter signal changes, most commonly due to chronic small vessel disease. Electronically Signed   By: VEAR Hurst M.D.   On: 10/23/2022 05:46   DG Chest 2 View Result Date: 10/22/2022 CLINICAL DATA:  Chest pain EXAM: CHEST - 2 VIEW COMPARISON:  Abdominal series  06/01/2004 FINDINGS: The heart size and mediastinal contours are within normal limits. Both lungs are clear. Cervical spinal fusion plate is present. No acute fracture identified. IMPRESSION: No active cardiopulmonary disease. Electronically Signed   By: Greig Pique M.D.   On: 10/22/2022 23:35    Assessment & Plan:   Problem List Items Addressed This Visit     Prediabetes   Check A1c      Relevant Orders   Hemoglobin A1c   Gout - Primary    Possible gout L foot 1st MTP pain - seeing Dr ONEIDA Chancy  Uric acid 6.8 Pt declined meds in the past      Relevant Orders   Uric acid   Foot pain, left    Possible gout vs other L foot 1st MTP pain - seeing Dr ONEIDA Chancy  Uric acid 6.8 Pt declined meds in the past Pt would like to see a podiatrist Celebrex  200 mg bid pc prn  Potential benefits of a long term NSAID  use as well as potential risks  and complications were explained to the patient and were aknowledged.        Relevant Orders   Ambulatory referral to Podiatry   CBC with Differential/Platelet   Comprehensive metabolic panel with GFR   Uric acid   Sedimentation rate      Meds ordered this encounter  Medications   celecoxib  (CELEBREX ) 200 MG capsule    Sig: Take 1 capsule (200 mg total) by mouth 2 (two) times daily.    Dispense:  60 capsule     Refill:  1      Follow-up: Return in about 6 weeks (around 10/26/2023) for a follow-up visit.  Marolyn Noel, MD

## 2023-09-14 NOTE — Assessment & Plan Note (Addendum)
 Possible gout L foot 1st MTP pain - seeing Dr ONEIDA Chancy  Uric acid 6.8 Pt declined meds in the past

## 2023-09-18 ENCOUNTER — Ambulatory Visit: Payer: Self-pay | Admitting: Internal Medicine

## 2023-09-22 ENCOUNTER — Ambulatory Visit (INDEPENDENT_AMBULATORY_CARE_PROVIDER_SITE_OTHER)

## 2023-09-22 ENCOUNTER — Ambulatory Visit: Admitting: Podiatry

## 2023-09-22 ENCOUNTER — Encounter: Payer: Self-pay | Admitting: Podiatry

## 2023-09-22 DIAGNOSIS — M205X2 Other deformities of toe(s) (acquired), left foot: Secondary | ICD-10-CM

## 2023-09-22 DIAGNOSIS — M1A9XX0 Chronic gout, unspecified, without tophus (tophi): Secondary | ICD-10-CM

## 2023-09-22 DIAGNOSIS — M722 Plantar fascial fibromatosis: Secondary | ICD-10-CM | POA: Diagnosis not present

## 2023-09-22 NOTE — Progress Notes (Signed)
  Subjective:  Patient ID: Bailey [name], female    DOB: 1950-04-17,   MRN: [id number]  Chief Complaint  Patient presents with   Foot Pain    It's my bunion on my left foot.  My whole foot was red at one time.    73 y.o. female presents for concern of left foot pain that has been ongoing since January when she started to remodel her house  . Relates the great toe area is very painful. Has been red and swollen on two occasions. She has seen PCP and orthopedics and given steroid which has helped.   Does have a history of gout and most recent uric acid was 8.0.  She has however continued to have pain when walking in the toe though not quite as bad. She was told she may need bunion surgery  Denies any other pedal complaints. Denies n/v/f/c.   Past Medical History:  Diagnosis Date   Actinic keratosis    Anemia    Arthritis    Arthritis of right knee    Borderline diabetes    Burning sensation of feet    Colitis    hx colitis-gi bleed-2006   Constipation    DYSLIPIDEMIA    Gallbladder problem    GI bleed    January 2024   Headache    hx of 10 years ago   Hx of cardiovascular stress test    Lexiscan  Myoview 9/14:  Small, fixed apical anterior perfusion defect (worse at rest) - probable soft tissue attenuation, EF 61%, low risk study   HYPERTENSION    Lower extremity edema    Partial tear of subscapularis tendon 04/22/2012   POSTMENOPAUSAL STATUS    Pre-diabetes    Supraspinatus tendon tear 04/22/2012   Tubular adenoma of colon 08/2013   Vitamin D  deficiency     Objective:  Physical Exam: Vascular: DP/PT pulses 2/4 bilateral. CFT <3 seconds. Normal hair growth on digits. No edema.  Skin. No lacerations or abrasions bilateral feet.  Musculoskeletal: MMT 5/5 bilateral lower extremities in DF, PF, Inversion and Eversion. Deceased ROM in DF of ankle joint.  Neurological: Sensation intact to light touch.   Assessment:   1. Hallux limitus, left   2. Chronic gout involving toe  of left foot without tophus, unspecified cause      Plan:  Patient was evaluated and treated and all questions answered. -Xrays reviewed. No acute fracures or dislocations. Degenerative changes noted at the first MPJ with medial cystic erosion noted concerning for gout. Pes planus noted as well with inferior spurring of calcaneus and calcification along plantar arch.  -Discussed treatement options for gouty arthritis and gout education provided. -No current gout flare but did recommend potentially starting long term gout medication given uric acid of 8.0.  -Discussed hallux limitus and  treatement options; conservative and  Surgical management; risks, benefits, alternatives discussed. All patient's questions answered. -Continue celebrex .  -Will hold off on injection for now.  -Recommend continue with good supportive shoes and inserts. Discussed stiff soled shoes and use of carbon fiber foot plate.   -Discussed for potential surgery in future as needed.  -Patient to return to office as needed or sooner if condition worsens.     Asberry Failing, DPM

## 2023-10-01 ENCOUNTER — Ambulatory Visit: Admitting: Internal Medicine

## 2023-10-05 ENCOUNTER — Ambulatory Visit: Admitting: Dermatology

## 2023-10-20 ENCOUNTER — Other Ambulatory Visit: Payer: Self-pay | Admitting: Internal Medicine

## 2023-10-26 ENCOUNTER — Ambulatory Visit: Admitting: Internal Medicine

## 2023-10-26 ENCOUNTER — Encounter: Payer: Self-pay | Admitting: Internal Medicine

## 2023-10-26 VITALS — BP 134/78 | HR 64 | Temp 97.6°F | Ht 65.5 in | Wt 198.0 lb

## 2023-10-26 DIAGNOSIS — E66811 Obesity, class 1: Secondary | ICD-10-CM

## 2023-10-26 DIAGNOSIS — M10072 Idiopathic gout, left ankle and foot: Secondary | ICD-10-CM | POA: Diagnosis not present

## 2023-10-26 DIAGNOSIS — I10 Essential (primary) hypertension: Secondary | ICD-10-CM | POA: Diagnosis not present

## 2023-10-26 DIAGNOSIS — F411 Generalized anxiety disorder: Secondary | ICD-10-CM | POA: Diagnosis not present

## 2023-10-26 MED ORDER — CELECOXIB 200 MG PO CAPS: 200.0000 mg | ORAL_CAPSULE | Freq: Two times a day (BID) | ORAL | 1 refills | Status: DC

## 2023-10-26 MED ORDER — COLCHICINE 0.6 MG PO TABS: ORAL_TABLET | ORAL | 1 refills | Status: AC

## 2023-10-26 MED ORDER — PHENTERMINE HCL 37.5 MG PO TABS: 37.5000 mg | ORAL_TABLET | Freq: Every day | ORAL | 2 refills | Status: DC

## 2023-10-26 MED ORDER — TIRZEPATIDE-WEIGHT MANAGEMENT 2.5 MG/0.5ML ~~LOC~~ SOLN: 2.5000 mg | SUBCUTANEOUS | 5 refills | Status: AC

## 2023-10-26 NOTE — Assessment & Plan Note (Signed)
 BP Readings from Last 3 Encounters:  10/26/23 134/78  09/14/23 (!) 142/86  05/17/23 134/80  Nl BP Olmesartan /Amlod 20/5 q am and Olmesart 20 mg at bedtime Ref to Med Nutrition

## 2023-10-26 NOTE — Assessment & Plan Note (Signed)
Chronic Not on treatment

## 2023-10-26 NOTE — Assessment & Plan Note (Signed)
 Using Celebrex  prn  Potential benefits of a long term NSAID use as well as potential risks  and complications were explained to the patient and were aknowledged. Colchicine  prn

## 2023-10-26 NOTE — Assessment & Plan Note (Addendum)
 BMI 31.96 Wegovy  Rx was given (HTN, hyperglycemia, dyslipidemia) - never filled, too $$$ StarPhentermine  1 a day  Potential benefits of a long term phentermine  use as well as potential risks  and complications were explained to the patient and were aknowledged. Start Phentermine  1 a day  Potential benefits of a long term phentermine  use as well as potential risks  and complications were explained to the patient and were aknowledged.

## 2023-10-26 NOTE — Progress Notes (Signed)
 Subjective:  Patient ID: Bailey [name], female    DOB: Jun 14, 1950  Age: 73 y.o. MRN: [id number]  CC: Medical Management of Chronic Issues (6 week f/u)   HPI Bailey [name] presents for gout  - Celebrex  helped. C/o wt gain. F/u on HTN  Outpatient Medications Prior to Visit  Medication Sig Dispense Refill   Biotin 1 MG CAPS Take by mouth.     Cholecalciferol (VITAMIN D3) 50 MCG (2000 UT) capsule Take 1 capsule (2,000 Units total) by mouth daily. 100 capsule 3   Cranberry (GENNAMD) 130 MG CAPS Take by mouth.     hydrALAZINE  (APRESOLINE ) 25 MG tablet Take 1 tablet (25 mg total) by mouth 3 (three) times daily as needed. 30 tablet 0   olmesartan  (BENICAR ) 20 MG tablet TAKE 1 TABLET BY MOUTH EVERY DAY 90 tablet 3   triamcinolone  ointment (KENALOG ) 0.1 % Apply 1 Application topically 2 (two) times daily as needed (Rash). 453.6 g 0   vitamin B-12 (V-R VITAMIN B-12) 500 MCG tablet Take 1 tablet (500 mcg total) by mouth daily. 100 tablet 3   zinc gluconate 50 MG tablet Take 50 mg by mouth daily.     celecoxib  (CELEBREX ) 200 MG capsule Take 1 capsule (200 mg total) by mouth 2 (two) times daily. 60 capsule 1   fluorouracil  (EFUDEX ) 5 % cream  (Patient not taking: Reported on 10/26/2023)     phenazopyridine  (PYRIDIUM ) 95 MG tablet Take by mouth. (Patient not taking: Reported on 10/26/2023)     Semaglutide -Weight Management (WEGOVY ) 0.25 MG/0.5ML SOAJ Inject 0.25 mg into the skin once a week. (Patient not taking: Reported on 10/26/2023) 3 mL 3   No facility-administered medications prior to visit.    ROS: Review of Systems  Constitutional:  Negative for activity change, appetite change, chills, fatigue and unexpected weight change.  HENT:  Negative for congestion, mouth sores and sinus pressure.   Eyes:  Negative for visual disturbance.  Respiratory:  Negative for cough and chest tightness.   Gastrointestinal:  Negative for abdominal pain and nausea.  Genitourinary:  Negative for difficulty  urinating, frequency and vaginal pain.  Musculoskeletal:  Negative for back pain and gait problem.  Skin:  Negative for pallor and rash.  Neurological:  Negative for dizziness, tremors, weakness, numbness and headaches.  Psychiatric/Behavioral:  Negative for confusion, sleep disturbance and suicidal ideas.     Objective:  BP 134/78   Pulse 64   Temp 97.6 F (36.4 C) (Temporal)   Ht 5' 5.5 (1.664 m)   Wt 198 lb (89.8 kg)   SpO2 98%   BMI 32.45 kg/m   BP Readings from Last 3 Encounters:  10/26/23 134/78  09/14/23 (!) 142/86  05/17/23 134/80    Wt Readings from Last 3 Encounters:  10/26/23 198 lb (89.8 kg)  09/14/23 196 lb (88.9 kg)  05/25/23 194 lb (88 kg)    Physical Exam Constitutional:      General: She is not in acute distress.    Appearance: She is well-developed. She is obese.  HENT:     Head: Normocephalic.     Right Ear: External ear normal.     Left Ear: External ear normal.     Nose: Nose normal.  Eyes:     General:        Right eye: No discharge.        Left eye: No discharge.     Conjunctiva/sclera: Conjunctivae normal.     Pupils: Pupils are equal, round,  and reactive to light.  Neck:     Thyroid : No thyromegaly.     Vascular: No JVD.     Trachea: No tracheal deviation.  Cardiovascular:     Rate and Rhythm: Normal rate and regular rhythm.     Heart sounds: Normal heart sounds.  Pulmonary:     Effort: No respiratory distress.     Breath sounds: No stridor. No wheezing.  Abdominal:     General: Bowel sounds are normal. There is no distension.     Palpations: Abdomen is soft. There is no mass.     Tenderness: There is no abdominal tenderness. There is no guarding or rebound.  Musculoskeletal:        General: Tenderness present.     Cervical back: Normal range of motion and neck supple. No rigidity.  Lymphadenopathy:     Cervical: No cervical adenopathy.  Skin:    Findings: No erythema or rash.  Neurological:     Mental Status: She is  oriented to person, place, and time.     Cranial Nerves: No cranial nerve deficit.     Motor: No abnormal muscle tone.     Coordination: Coordination normal.     Deep Tendon Reflexes: Reflexes normal.  Psychiatric:        Behavior: Behavior normal.        Thought Content: Thought content normal.        Judgment: Judgment normal.   L foot/MCP - swollen w/pain  Lab Results  Component Value Date   WBC 5.2 09/14/2023   HGB 15.1 (H) 09/14/2023   HCT 44.0 09/14/2023   PLT 243.0 09/14/2023   GLUCOSE 91 09/14/2023   CHOL 227 (H) 01/27/2021   TRIG 91.0 01/27/2021   HDL 50.90 01/27/2021   LDLDIRECT 132.0 07/10/2019   LDLCALC 158 (H) 01/27/2021   ALT 12 09/14/2023   AST 15 09/14/2023   NA 138 09/14/2023   K 4.1 09/14/2023   CL 104 09/14/2023   CREATININE 0.72 09/14/2023   BUN 17 09/14/2023   CO2 29 09/14/2023   TSH 1.23 09/28/2022   INR 1.0 08/07/2019   HGBA1C 6.2 09/14/2023    MR BRAIN WO CONTRAST Result Date: 10/23/2022 CLINICAL DATA:  73 year old female who presented with chest pain and hypertension. Off balance, neurologic deficit. EXAM: MRI HEAD WITHOUT CONTRAST TECHNIQUE: Multiplanar, multiecho pulse sequences of the brain and surrounding structures were obtained without intravenous contrast. COMPARISON:  None Available. FINDINGS: Brain: Overall cerebral volume appears within normal limits for age. No restricted diffusion to suggest acute infarction. No midline shift, mass effect, evidence of mass lesion, ventriculomegaly, extra-axial collection or acute intracranial hemorrhage. Cervicomedullary junction and pituitary are within normal limits. No cortical encephalomalacia or chronic cerebral blood products. Mild for age scattered small cerebral white matter T2 and FLAIR hyperintense foci in a nonspecific configuration. Minimal T2 heterogeneity in the deep gray nuclei, otherwise normal along with the brainstem and cerebellum. Vascular: Major intracranial vascular flow voids are  preserved. Distal left vertebral artery appears mildly dominant. Mild intracranial artery tortuosity. Skull and upper cervical spine: Scaphocephaly, normal variant. Visualized bone marrow signal is within normal limits. Normal visible cervical spine. Sinuses/Orbits: Negative orbits. Mild paranasal sinus mucosal thickening, primarily in the right sphenoid. No sinus fluid levels identified. Other: Mastoids are well aerated. Visible internal auditory structures appear normal. Negative visible scalp and face. IMPRESSION: 1. No acute intracranial abnormality and mostly unremarkable noncontrast brain MRI for age. 2. Mild for age white matter signal changes, most  commonly due to chronic small vessel disease. Electronically Signed   By: VEAR Hurst M.D.   On: 10/23/2022 05:46   DG Chest 2 View Result Date: 10/22/2022 CLINICAL DATA:  Chest pain EXAM: CHEST - 2 VIEW COMPARISON:  Abdominal series 06/01/2004 FINDINGS: The heart size and mediastinal contours are within normal limits. Both lungs are clear. Cervical spinal fusion plate is present. No acute fracture identified. IMPRESSION: No active cardiopulmonary disease. Electronically Signed   By: Greig Pique M.D.   On: 10/22/2022 23:35    Assessment & Plan:   Problem List Items Addressed This Visit     Essential hypertension (Chronic)   BP Readings from Last 3 Encounters:  10/26/23 134/78  09/14/23 (!) 142/86  05/17/23 134/80  Nl BP Olmesartan /Amlod 20/5 q am and Olmesart 20 mg at bedtime Ref to Med Nutrition      GAD (generalized anxiety disorder)   Chronic Not on treatment      Gout - Primary   Using Celebrex  prn  Potential benefits of a long term NSAID use as well as potential risks  and complications were explained to the patient and were aknowledged. Colchicine  prn       Obesity (BMI 30.0-34.9)   BMI 31.96 Wegovy  Rx was given (HTN, hyperglycemia, dyslipidemia) - never filled, too $$$ StarPhentermine  1 a day  Potential benefits of a long  term phentermine  use as well as potential risks  and complications were explained to the patient and were aknowledged. Start Phentermine  1 a day  Potential benefits of a long term phentermine  use as well as potential risks  and complications were explained to the patient and were aknowledged.          Meds ordered this encounter  Medications   colchicine  0.6 MG tablet    Sig: Take two tablets prn gout attack.Then take another one in 1-2 hrs. Do not repeat for 3 days.    Dispense:  18 tablet    Refill:  1   celecoxib  (CELEBREX ) 200 MG capsule    Sig: Take 1 capsule (200 mg total) by mouth 2 (two) times daily.    Dispense:  60 capsule    Refill:  1   phentermine  (ADIPEX-P ) 37.5 MG tablet    Sig: Take 1 tablet (37.5 mg total) by mouth daily before breakfast.    Dispense:  30 tablet    Refill:  2   tirzepatide  (ZEPBOUND ) 2.5 MG/0.5ML injection vial    Sig: Inject 2.5 mg into the skin once a week.    Dispense:  2 mL    Refill:  5      Follow-up: Return in about 4 weeks (around 11/23/2023) for a follow-up visit.  Marolyn Noel, MD

## 2023-11-04 DIAGNOSIS — M6289 Other specified disorders of muscle: Secondary | ICD-10-CM | POA: Diagnosis not present

## 2023-11-04 DIAGNOSIS — M6281 Muscle weakness (generalized): Secondary | ICD-10-CM | POA: Diagnosis not present

## 2023-11-22 ENCOUNTER — Telehealth: Payer: Self-pay

## 2023-11-22 NOTE — Telephone Encounter (Unsigned)
 Copied from CRM [id number]. Topic: Clinical - Medical Advice >> Nov 22, 2023  9:34 AM Viola F wrote: Just an FYI for Dr. Garald - Patient took half tablet of the phentermine  (ADIPEX-P ) 37.5 MG and didn't like the way it made her feel (couldn't describe it) so she stopped taking it two weeks ago and pushed her appt out to 12/28/23

## 2023-11-24 ENCOUNTER — Ambulatory Visit: Admitting: Internal Medicine

## 2023-11-25 ENCOUNTER — Other Ambulatory Visit: Payer: Self-pay | Admitting: Internal Medicine

## 2023-11-25 NOTE — Telephone Encounter (Signed)
 Noted.  Stop phentermine 

## 2023-12-10 NOTE — Telephone Encounter (Signed)
 Left voicemail to call office back to go over results.

## 2023-12-28 ENCOUNTER — Encounter: Payer: Self-pay | Admitting: Internal Medicine

## 2023-12-28 ENCOUNTER — Ambulatory Visit: Admitting: Internal Medicine

## 2023-12-28 VITALS — BP 134/80 | HR 56 | Temp 97.9°F | Ht 65.5 in | Wt 193.8 lb

## 2023-12-28 DIAGNOSIS — M79672 Pain in left foot: Secondary | ICD-10-CM | POA: Diagnosis not present

## 2023-12-28 DIAGNOSIS — E6609 Other obesity due to excess calories: Secondary | ICD-10-CM | POA: Diagnosis not present

## 2023-12-28 DIAGNOSIS — R739 Hyperglycemia, unspecified: Secondary | ICD-10-CM

## 2023-12-28 DIAGNOSIS — Z6831 Body mass index (BMI) 31.0-31.9, adult: Secondary | ICD-10-CM | POA: Diagnosis not present

## 2023-12-28 DIAGNOSIS — I10 Essential (primary) hypertension: Secondary | ICD-10-CM

## 2023-12-28 DIAGNOSIS — Z7184 Encounter for health counseling related to travel: Secondary | ICD-10-CM | POA: Diagnosis not present

## 2023-12-28 DIAGNOSIS — E66811 Obesity, class 1: Secondary | ICD-10-CM

## 2023-12-28 MED ORDER — PROMETHAZINE HCL 12.5 MG PO TABS: 12.5000 mg | ORAL_TABLET | Freq: Four times a day (QID) | ORAL | 1 refills | Status: DC | PRN

## 2023-12-28 MED ORDER — AZITHROMYCIN 250 MG PO TABS: ORAL_TABLET | ORAL | 0 refills | Status: DC

## 2023-12-28 NOTE — Assessment & Plan Note (Signed)
 Better Using HOKAs

## 2023-12-28 NOTE — Patient Instructions (Signed)
 Try Great Value Peach [place] Herbal Tea

## 2023-12-28 NOTE — Assessment & Plan Note (Signed)
 BP Readings from Last 3 Encounters:  12/28/23 134/80  10/26/23 134/78  09/14/23 (!) 142/86  Nl BP Olmesartan /Amlod 20/5 q am and Olmesart 20 mg at bedtime Ref to Med Nutrition

## 2023-12-28 NOTE — Progress Notes (Signed)
 Subjective:  Patient ID: Bailey [name], female    DOB: 12-27-1950  Age: 73 y.o. MRN: [id number]  CC: Medical Management of Chronic Issues (4 Week follow up for gout)   HPI Bailey [name] presents for travel F/u on gout - better, obesity  Outpatient Medications Prior to Visit  Medication Sig Dispense Refill   Biotin 1 MG CAPS Take by mouth.     celecoxib  (CELEBREX ) 200 MG capsule Take 1 capsule (200 mg total) by mouth 2 (two) times daily. 60 capsule 1   Cholecalciferol (VITAMIN D3) 50 MCG (2000 UT) capsule Take 1 capsule (2,000 Units total) by mouth daily. 100 capsule 3   colchicine  0.6 MG tablet Take two tablets prn gout attack.Then take another one in 1-2 hrs. Do not repeat for 3 days. 18 tablet 1   Cranberry (GENNAMD) 130 MG CAPS Take by mouth.     hydrALAZINE  (APRESOLINE ) 25 MG tablet Take 1 tablet (25 mg total) by mouth 3 (three) times daily as needed. 30 tablet 0   olmesartan  (BENICAR ) 20 MG tablet TAKE 1 TABLET BY MOUTH EVERY DAY 90 tablet 3   triamcinolone  ointment (KENALOG ) 0.1 % Apply 1 Application topically 2 (two) times daily as needed (Rash). 453.6 g 0   vitamin B-12 (V-R VITAMIN B-12) 500 MCG tablet Take 1 tablet (500 mcg total) by mouth daily. 100 tablet 3   zinc gluconate 50 MG tablet Take 50 mg by mouth daily.     fluorouracil  (EFUDEX ) 5 % cream  (Patient not taking: Reported on 12/28/2023)     phenazopyridine  (PYRIDIUM ) 95 MG tablet Take by mouth. (Patient not taking: Reported on 12/28/2023)     tirzepatide  (ZEPBOUND ) 2.5 MG/0.5ML injection vial Inject 2.5 mg into the skin once a week. (Patient not taking: Reported on 12/28/2023) 2 mL 5   No facility-administered medications prior to visit.    ROS: Review of Systems  Constitutional:  Negative for activity change, appetite change, chills, fatigue and unexpected weight change.  HENT:  Negative for congestion, mouth sores and sinus pressure.   Eyes:  Negative for visual disturbance.  Respiratory:  Negative for  cough and chest tightness.   Gastrointestinal:  Negative for abdominal pain and nausea.  Genitourinary:  Negative for difficulty urinating, frequency and vaginal pain.  Musculoskeletal:  Positive for arthralgias. Negative for back pain and gait problem.  Skin:  Negative for pallor and rash.  Neurological:  Negative for dizziness, tremors, weakness, numbness and headaches.  Psychiatric/Behavioral:  Negative for confusion and sleep disturbance.     Objective:  BP 134/80   Pulse (!) 56   Temp 97.9 F (36.6 C)   Ht 5' 5.5 (1.664 m)   Wt 193 lb 12.8 oz (87.9 kg)   SpO2 99%   BMI 31.76 kg/m   BP Readings from Last 3 Encounters:  12/28/23 134/80  10/26/23 134/78  09/14/23 (!) 142/86    Wt Readings from Last 3 Encounters:  12/28/23 193 lb 12.8 oz (87.9 kg)  10/26/23 198 lb (89.8 kg)  09/14/23 196 lb (88.9 kg)    Physical Exam Constitutional:      General: She is not in acute distress.    Appearance: She is well-developed.  HENT:     Head: Normocephalic.     Right Ear: External ear normal.     Left Ear: External ear normal.     Nose: Nose normal.  Eyes:     General:        Right eye: No  discharge.        Left eye: No discharge.     Conjunctiva/sclera: Conjunctivae normal.     Pupils: Pupils are equal, round, and reactive to light.  Neck:     Thyroid : No thyromegaly.     Vascular: No JVD.     Trachea: No tracheal deviation.  Cardiovascular:     Rate and Rhythm: Normal rate and regular rhythm.     Heart sounds: Normal heart sounds.  Pulmonary:     Effort: No respiratory distress.     Breath sounds: No stridor. No wheezing.  Abdominal:     General: Bowel sounds are normal. There is no distension.     Palpations: Abdomen is soft. There is no mass.     Tenderness: There is no abdominal tenderness. There is no guarding or rebound.  Musculoskeletal:        General: No tenderness.     Cervical back: Normal range of motion and neck supple. No rigidity.  Lymphadenopathy:      Cervical: No cervical adenopathy.  Skin:    Findings: No erythema or rash.  Neurological:     Cranial Nerves: No cranial nerve deficit.     Motor: No abnormal muscle tone.     Coordination: Coordination normal.     Deep Tendon Reflexes: Reflexes normal.  Psychiatric:        Behavior: Behavior normal.        Thought Content: Thought content normal.        Judgment: Judgment normal.   L foot NT  Lab Results  Component Value Date   WBC 5.2 09/14/2023   HGB 15.1 (H) 09/14/2023   HCT 44.0 09/14/2023   PLT 243.0 09/14/2023   GLUCOSE 91 09/14/2023   CHOL 227 (H) 01/27/2021   TRIG 91.0 01/27/2021   HDL 50.90 01/27/2021   LDLDIRECT 132.0 07/10/2019   LDLCALC 158 (H) 01/27/2021   ALT 12 09/14/2023   AST 15 09/14/2023   NA 138 09/14/2023   K 4.1 09/14/2023   CL 104 09/14/2023   CREATININE 0.72 09/14/2023   BUN 17 09/14/2023   CO2 29 09/14/2023   TSH 1.23 09/28/2022   INR 1.0 08/07/2019   HGBA1C 6.2 09/14/2023    MR BRAIN WO CONTRAST Result Date: 10/23/2022 CLINICAL DATA:  73 year old female who presented with chest pain and hypertension. Off balance, neurologic deficit. EXAM: MRI HEAD WITHOUT CONTRAST TECHNIQUE: Multiplanar, multiecho pulse sequences of the brain and surrounding structures were obtained without intravenous contrast. COMPARISON:  None Available. FINDINGS: Brain: Overall cerebral volume appears within normal limits for age. No restricted diffusion to suggest acute infarction. No midline shift, mass effect, evidence of mass lesion, ventriculomegaly, extra-axial collection or acute intracranial hemorrhage. Cervicomedullary junction and pituitary are within normal limits. No cortical encephalomalacia or chronic cerebral blood products. Mild for age scattered small cerebral white matter T2 and FLAIR hyperintense foci in a nonspecific configuration. Minimal T2 heterogeneity in the deep gray nuclei, otherwise normal along with the brainstem and cerebellum. Vascular: Major  intracranial vascular flow voids are preserved. Distal left vertebral artery appears mildly dominant. Mild intracranial artery tortuosity. Skull and upper cervical spine: Scaphocephaly, normal variant. Visualized bone marrow signal is within normal limits. Normal visible cervical spine. Sinuses/Orbits: Negative orbits. Mild paranasal sinus mucosal thickening, primarily in the right sphenoid. No sinus fluid levels identified. Other: Mastoids are well aerated. Visible internal auditory structures appear normal. Negative visible scalp and face. IMPRESSION: 1. No acute intracranial abnormality and mostly unremarkable noncontrast brain  MRI for age. 2. Mild for age white matter signal changes, most commonly due to chronic small vessel disease. Electronically Signed   By: VEAR Hurst M.D.   On: 10/23/2022 05:46   DG Chest 2 View Result Date: 10/22/2022 CLINICAL DATA:  Chest pain EXAM: CHEST - 2 VIEW COMPARISON:  Abdominal series 06/01/2004 FINDINGS: The heart size and mediastinal contours are within normal limits. Both lungs are clear. Cervical spinal fusion plate is present. No acute fracture identified. IMPRESSION: No active cardiopulmonary disease. Electronically Signed   By: Greig Pique M.D.   On: 10/22/2022 23:35    Assessment & Plan:   Problem List Items Addressed This Visit     Essential hypertension - Primary (Chronic)   BP Readings from Last 3 Encounters:  12/28/23 134/80  10/26/23 134/78  09/14/23 (!) 142/86  Nl BP Olmesartan /Amlod 20/5 q am and Olmesart 20 mg at bedtime Ref to Med Nutrition      Foot pain, left   Better Using HOKAs      Hyperglycemia   Monitor A1c      Obesity   On Phentermine  prn; diet      Obesity (BMI 30.0-34.9)   BMI 31.96 Phentermine  1/2 a day prn  Potential benefits of a long term phentermine  use as well as potential risks  and complications were explained to the patient and were aknowledged.       Travel advice encounter   Going on a cruise A  Zpack Promethazine  prn         Meds ordered this encounter  Medications   azithromycin  (ZITHROMAX  Z-PAK) 250 MG tablet    Sig: As directed    Dispense:  6 tablet    Refill:  0   promethazine  (PHENERGAN ) 12.5 MG tablet    Sig: Take 1-2 tablets (12.5-25 mg total) by mouth every 6 (six) hours as needed for up to 7 days for nausea or vomiting.    Dispense:  60 tablet    Refill:  1      Follow-up: Return in about 3 months (around 03/29/2024) for a follow-up visit.  Marolyn Noel, MD

## 2023-12-28 NOTE — Assessment & Plan Note (Signed)
 On Phentermine  prn; diet

## 2023-12-28 NOTE — Assessment & Plan Note (Signed)
Monitor A1c 

## 2023-12-28 NOTE — Assessment & Plan Note (Addendum)
 Going on a cruise A Zpack Promethazine  prn

## 2023-12-28 NOTE — Assessment & Plan Note (Signed)
 BMI 31.96 Phentermine  1/2 a day prn  Potential benefits of a long term phentermine  use as well as potential risks  and complications were explained to the patient and were aknowledged.

## 2024-03-01 ENCOUNTER — Encounter: Payer: Self-pay | Admitting: Internal Medicine

## 2024-03-01 ENCOUNTER — Ambulatory Visit (INDEPENDENT_AMBULATORY_CARE_PROVIDER_SITE_OTHER)

## 2024-03-01 ENCOUNTER — Ambulatory Visit: Admitting: Internal Medicine

## 2024-03-01 VITALS — BP 138/88 | HR 85 | Temp 97.8°F | Ht 65.5 in | Wt 194.0 lb

## 2024-03-01 DIAGNOSIS — R0609 Other forms of dyspnea: Secondary | ICD-10-CM | POA: Diagnosis not present

## 2024-03-01 DIAGNOSIS — J069 Acute upper respiratory infection, unspecified: Secondary | ICD-10-CM

## 2024-03-01 DIAGNOSIS — R202 Paresthesia of skin: Secondary | ICD-10-CM

## 2024-03-01 DIAGNOSIS — I10 Essential (primary) hypertension: Secondary | ICD-10-CM

## 2024-03-01 DIAGNOSIS — R062 Wheezing: Secondary | ICD-10-CM

## 2024-03-01 LAB — CBC WITH DIFFERENTIAL/PLATELET
Basophils Absolute: 0 K/uL (ref 0.0–0.1)
Basophils Relative: 0.6 % (ref 0.0–3.0)
Eosinophils Absolute: 0.1 K/uL (ref 0.0–0.7)
Eosinophils Relative: 0.9 % (ref 0.0–5.0)
HCT: 45.1 % (ref 36.0–46.0)
Hemoglobin: 15.3 g/dL — ABNORMAL HIGH (ref 12.0–15.0)
Lymphocytes Relative: 22.5 % (ref 12.0–46.0)
Lymphs Abs: 1.5 K/uL (ref 0.7–4.0)
MCHC: 33.8 g/dL (ref 30.0–36.0)
MCV: 93.4 fl (ref 78.0–100.0)
Monocytes Absolute: 0.5 K/uL (ref 0.1–1.0)
Monocytes Relative: 8.1 % (ref 3.0–12.0)
Neutro Abs: 4.4 K/uL (ref 1.4–7.7)
Neutrophils Relative %: 67.9 % (ref 43.0–77.0)
Platelets: 215 K/uL (ref 150.0–400.0)
RBC: 4.83 Mil/uL (ref 3.87–5.11)
RDW: 13.7 % (ref 11.5–15.5)
WBC: 6.5 K/uL (ref 4.0–10.5)

## 2024-03-01 LAB — COMPREHENSIVE METABOLIC PANEL WITH GFR
ALT: 24 U/L (ref 3–35)
AST: 22 U/L (ref 5–37)
Albumin: 4.5 g/dL (ref 3.5–5.2)
Alkaline Phosphatase: 70 U/L (ref 39–117)
BUN: 16 mg/dL (ref 6–23)
CO2: 28 meq/L (ref 19–32)
Calcium: 10.2 mg/dL (ref 8.4–10.5)
Chloride: 103 meq/L (ref 96–112)
Creatinine, Ser: 0.75 mg/dL (ref 0.40–1.20)
GFR: 78.91 mL/min
Glucose, Bld: 120 mg/dL — ABNORMAL HIGH (ref 70–99)
Potassium: 4.3 meq/L (ref 3.5–5.1)
Sodium: 138 meq/L (ref 135–145)
Total Bilirubin: 1.1 mg/dL (ref 0.2–1.2)
Total Protein: 7.6 g/dL (ref 6.0–8.3)

## 2024-03-01 LAB — VITAMIN B12: Vitamin B-12: 389 pg/mL (ref 211–911)

## 2024-03-01 LAB — MAGNESIUM: Magnesium: 1.9 mg/dL (ref 1.5–2.5)

## 2024-03-01 MED ORDER — METHYLPREDNISOLONE 4 MG PO TBPK: ORAL_TABLET | ORAL | 0 refills | Status: DC

## 2024-03-01 MED ORDER — CEFUROXIME AXETIL 500 MG PO TABS: 500.0000 mg | ORAL_TABLET | Freq: Two times a day (BID) | ORAL | 0 refills | Status: AC

## 2024-03-01 MED ORDER — ALBUTEROL SULFATE 2 MG PO TABS: 2.0000 mg | ORAL_TABLET | Freq: Four times a day (QID) | ORAL | 0 refills | Status: DC | PRN

## 2024-03-01 MED ORDER — HYDROCODONE BIT-HOMATROP MBR 5-1.5 MG/5ML PO SOLN: 5.0000 mL | Freq: Three times a day (TID) | ORAL | 0 refills | Status: DC | PRN

## 2024-03-01 NOTE — Progress Notes (Signed)
 "  Subjective:  Patient ID: Bailey [name], female    DOB: 08-13-1950  Age: 73 y.o. MRN: [id number]  CC: Cough (Pt states she has been having wheezing x2 weeks and now has a cough, fatigue and chest heaviness)   HPI Bailey [name] presents for cough (Pt states she has been having wheezing x2 weeks and now has a cough, fatigue and chest heaviness). Bailey [name] took a Zpac - it helped some. Feeling worse since Sat - dry  cough, wheezing  Outpatient Medications Prior to Visit  Medication Sig Dispense Refill   Biotin 1 MG CAPS Take by mouth.     celecoxib  (CELEBREX ) 200 MG capsule Take 1 capsule (200 mg total) by mouth 2 (two) times daily. 60 capsule 1   Cholecalciferol (VITAMIN D3) 50 MCG (2000 UT) capsule Take 1 capsule (2,000 Units total) by mouth daily. 100 capsule 3   colchicine  0.6 MG tablet Take two tablets prn gout attack.Then take another one in 1-2 hrs. Do not repeat for 3 days. 18 tablet 1   Cranberry (GENNAMD) 130 MG CAPS Take by mouth.     hydrALAZINE  (APRESOLINE ) 25 MG tablet Take 1 tablet (25 mg total) by mouth 3 (three) times daily as needed. 30 tablet 0   olmesartan  (BENICAR ) 20 MG tablet TAKE 1 TABLET BY MOUTH EVERY DAY 90 tablet 3   promethazine  (PHENERGAN ) 12.5 MG tablet Take 1-2 tablets (12.5-25 mg total) by mouth every 6 (six) hours as needed for up to 7 days for nausea or vomiting. 60 tablet 1   triamcinolone  ointment (KENALOG ) 0.1 % Apply 1 Application topically 2 (two) times daily as needed (Rash). 453.6 g 0   vitamin B-12 (V-R VITAMIN B-12) 500 MCG tablet Take 1 tablet (500 mcg total) by mouth daily. 100 tablet 3   zinc gluconate 50 MG tablet Take 50 mg by mouth daily.     azithromycin  (ZITHROMAX  Z-PAK) 250 MG tablet As directed 6 tablet 0   fluorouracil  (EFUDEX ) 5 % cream  (Patient not taking: Reported on 03/01/2024)     phenazopyridine  (PYRIDIUM ) 95 MG tablet Take by mouth. (Patient not taking: Reported on 03/01/2024)     tirzepatide  (ZEPBOUND ) 2.5 MG/0.5ML injection vial  Inject 2.5 mg into the skin once a week. (Patient not taking: Reported on 03/01/2024) 2 mL 5   No facility-administered medications prior to visit.    ROS: Review of Systems  Constitutional:  Positive for fatigue. Negative for activity change, appetite change, chills and unexpected weight change.  HENT:  Negative for congestion, mouth sores and sinus pressure.   Eyes:  Negative for visual disturbance.  Respiratory:  Positive for cough, shortness of breath and wheezing. Negative for chest tightness.   Gastrointestinal:  Negative for abdominal pain and nausea.  Genitourinary:  Negative for difficulty urinating, frequency and vaginal pain.  Musculoskeletal:  Negative for arthralgias, back pain and gait problem.  Skin:  Negative for pallor and rash.  Neurological:  Negative for dizziness, tremors, weakness, numbness and headaches.  Psychiatric/Behavioral:  Negative for confusion, sleep disturbance and suicidal ideas.     Objective:  BP 138/88   Pulse 85   Temp 97.8 F (36.6 C) (Oral)   Ht 5' 5.5 (1.664 m)   Wt 194 lb (88 kg)   SpO2 97%   BMI 31.79 kg/m   BP Readings from Last 3 Encounters:  03/01/24 138/88  12/28/23 134/80  10/26/23 134/78    Wt Readings from Last 3 Encounters:  03/01/24 194 lb (88 kg)  12/28/23 193 lb 12.8 oz (87.9 kg)  10/26/23 198 lb (89.8 kg)    Physical Exam Constitutional:      General: She is not in acute distress.    Appearance: She is well-developed. She is obese.  HENT:     Head: Normocephalic.     Right Ear: External ear normal.     Left Ear: External ear normal.     Nose: Nose normal.  Eyes:     General:        Right eye: No discharge.        Left eye: No discharge.     Conjunctiva/sclera: Conjunctivae normal.     Pupils: Pupils are equal, round, and reactive to light.  Neck:     Thyroid : No thyromegaly.     Vascular: No JVD.     Trachea: No tracheal deviation.  Cardiovascular:     Rate and Rhythm: Normal rate and regular rhythm.      Heart sounds: Normal heart sounds.  Pulmonary:     Effort: No respiratory distress.     Breath sounds: No stridor. No wheezing.  Abdominal:     General: Bowel sounds are normal. There is no distension.     Palpations: Abdomen is soft. There is no mass.     Tenderness: There is no abdominal tenderness. There is no guarding or rebound.  Musculoskeletal:        General: No tenderness.     Cervical back: Normal range of motion and neck supple. No rigidity.     Right lower leg: No edema.     Left lower leg: No edema.  Lymphadenopathy:     Cervical: No cervical adenopathy.  Skin:    Findings: No erythema or rash.  Neurological:     Cranial Nerves: No cranial nerve deficit.     Motor: No abnormal muscle tone.     Coordination: Coordination normal.     Deep Tendon Reflexes: Reflexes normal.  Psychiatric:        Behavior: Behavior normal.        Thought Content: Thought content normal.        Judgment: Judgment normal.     Lab Results  Component Value Date   WBC 5.2 09/14/2023   HGB 15.1 (H) 09/14/2023   HCT 44.0 09/14/2023   PLT 243.0 09/14/2023   GLUCOSE 91 09/14/2023   CHOL 227 (H) 01/27/2021   TRIG 91.0 01/27/2021   HDL 50.90 01/27/2021   LDLDIRECT 132.0 07/10/2019   LDLCALC 158 (H) 01/27/2021   ALT 12 09/14/2023   AST 15 09/14/2023   NA 138 09/14/2023   K 4.1 09/14/2023   CL 104 09/14/2023   CREATININE 0.72 09/14/2023   BUN 17 09/14/2023   CO2 29 09/14/2023   TSH 1.23 09/28/2022   INR 1.0 08/07/2019   HGBA1C 6.2 09/14/2023    MR BRAIN WO CONTRAST Result Date: 10/23/2022 CLINICAL DATA:  73 year old female who presented with chest pain and hypertension. Off balance, neurologic deficit. EXAM: MRI HEAD WITHOUT CONTRAST TECHNIQUE: Multiplanar, multiecho pulse sequences of the brain and surrounding structures were obtained without intravenous contrast. COMPARISON:  None Available. FINDINGS: Brain: Overall cerebral volume appears within normal limits for age. No  restricted diffusion to suggest acute infarction. No midline shift, mass effect, evidence of mass lesion, ventriculomegaly, extra-axial collection or acute intracranial hemorrhage. Cervicomedullary junction and pituitary are within normal limits. No cortical encephalomalacia or chronic cerebral blood products. Mild for age scattered small cerebral white matter T2 and  FLAIR hyperintense foci in a nonspecific configuration. Minimal T2 heterogeneity in the deep gray nuclei, otherwise normal along with the brainstem and cerebellum. Vascular: Major intracranial vascular flow voids are preserved. Distal left vertebral artery appears mildly dominant. Mild intracranial artery tortuosity. Skull and upper cervical spine: Scaphocephaly, normal variant. Visualized bone marrow signal is within normal limits. Normal visible cervical spine. Sinuses/Orbits: Negative orbits. Mild paranasal sinus mucosal thickening, primarily in the right sphenoid. No sinus fluid levels identified. Other: Mastoids are well aerated. Visible internal auditory structures appear normal. Negative visible scalp and face. IMPRESSION: 1. No acute intracranial abnormality and mostly unremarkable noncontrast brain MRI for age. 2. Mild for age white matter signal changes, most commonly due to chronic small vessel disease. Electronically Signed   By: VEAR Hurst M.D.   On: 10/23/2022 05:46   DG Chest 2 View Result Date: 10/22/2022 CLINICAL DATA:  Chest pain EXAM: CHEST - 2 VIEW COMPARISON:  Abdominal series 06/01/2004 FINDINGS: The heart size and mediastinal contours are within normal limits. Both lungs are clear. Cervical spinal fusion plate is present. No acute fracture identified. IMPRESSION: No active cardiopulmonary disease. Electronically Signed   By: Greig Pique M.D.   On: 10/22/2022 23:35    Assessment & Plan:   Problem List Items Addressed This Visit     Essential hypertension (Chronic)   Check labs      Relevant Orders   CBC with  Differential/Platelet   Comprehensive metabolic panel with GFR   Magnesium   Upper respiratory infection - Primary   Worse -  URI CXR Cough syr Rx Proair MDI - pt declined Albuterol 2 mg qid prn po Medrol  pack Labs w/BNP      Relevant Medications   cefUROXime (CEFTIN) 500 MG tablet   Other Relevant Orders   DG Chest 2 View   Wheezing   New due to URI CXR Cough syr Rx Proair MDI - pt declined Albuterol 2 mg qid prn po Medrol  pack Labs w/BNP      Relevant Orders   CBC with Differential/Platelet   Comprehensive metabolic panel with GFR   Magnesium   DG Chest 2 View   DOE (dyspnea on exertion)   CXR, BNP Treat URI Go to ER if worse      Relevant Orders   CBC with Differential/Platelet   Comprehensive metabolic panel with GFR   Magnesium   DG Chest 2 View   Other Visit Diagnoses       Paresthesia       Relevant Orders   Vitamin B12         Meds ordered this encounter  Medications   HYDROcodone  bit-homatropine (HYCODAN) 5-1.5 MG/5ML syrup    Sig: Take 5 mLs by mouth every 8 (eight) hours as needed for cough.    Dispense:  120 mL    Refill:  0   methylPREDNISolone  (MEDROL  DOSEPAK) 4 MG TBPK tablet    Sig: As directed    Dispense:  21 tablet    Refill:  0   cefUROXime (CEFTIN) 500 MG tablet    Sig: Take 1 tablet (500 mg total) by mouth 2 (two) times daily with a meal for 10 days.    Dispense:  20 tablet    Refill:  0   albuterol (PROVENTIL) 2 MG tablet    Sig: Take 1-2 tablets (2-4 mg total) by mouth 4 (four) times daily as needed for wheezing or shortness of breath.    Dispense:  100 tablet  Refill:  0      Follow-up: Return for a follow-up visit.  Marolyn Noel, MD "

## 2024-03-01 NOTE — Assessment & Plan Note (Addendum)
 Worse -  URI CXR Cough syr Rx Proair MDI - pt declined Albuterol 2 mg qid prn po Medrol  pack Labs w/BNP

## 2024-03-01 NOTE — Assessment & Plan Note (Addendum)
 New due to URI CXR Cough syr Rx Proair MDI - pt declined Albuterol 2 mg qid prn po Medrol  pack Labs w/BNP

## 2024-03-01 NOTE — Assessment & Plan Note (Signed)
 Check labs

## 2024-03-01 NOTE — Assessment & Plan Note (Signed)
 CXR, BNP Treat URI Go to ER if worse

## 2024-03-02 ENCOUNTER — Ambulatory Visit: Payer: Self-pay | Admitting: Internal Medicine

## 2024-03-02 DIAGNOSIS — R0600 Dyspnea, unspecified: Secondary | ICD-10-CM

## 2024-03-02 MED ORDER — FUROSEMIDE 20 MG PO TABS: 20.0000 mg | ORAL_TABLET | Freq: Every day | ORAL | 1 refills | Status: DC

## 2024-03-03 ENCOUNTER — Ambulatory Visit: Payer: Self-pay

## 2024-03-03 NOTE — Telephone Encounter (Signed)
" °  FYI Only or Action Required?: Action required by provider: Unable to get albuterol tabs at pharmacy, it is on order.  Patient was last seen in primary care on 03/01/2024 by Plotnikov, Karlynn GAILS, MD.  Called Nurse Triage reporting Shortness of Breath.  Symptoms began several days ago.  Interventions attempted: Prescription medications: lasix.  Symptoms are: unchanged.  Triage Disposition: Call PCP Now  Patient/caregiver understands and will follow disposition?:    Copied from CRM #[id number]. Topic: Clinical - Red Word Triage >> Mar 03, 2024 12:01 PM Rea ORN wrote: Red Word that prompted transfer to Nurse Triage: SOB Reason for Disposition  [1] Caller has URGENT medicine question about med that primary care doctor (or NP/PA) or specialist prescribed AND [2] triager unable to answer question  Answer Assessment - Initial Assessment Questions Caller very concerned about message from MD concerning her heart.  Explained that she would be contacted to schedule Echo. Now it's important to follow current care plan. Has started lasix. Unable to get albuterol. Advised to started tracking her wt and eat low sodium diet.    1. NAME of MEDICINE: What medicine(s) are you calling about?     albuterol 2. QUESTION: What is your question? (e.g., double dose of medicine, side effect)     Pharmacy unable to get the tablets.  3. PRESCRIBER: Who prescribed the medicine? Reason: if prescribed by specialist, call should be referred to that group.     Plotnikov 4. SYMPTOMS: Do you have any symptoms? If Yes, ask: What symptoms are you having?  How bad are the symptoms (e.g., mild, moderate, severe)     sob  Protocols used: Medication Question Call-A-AH  "

## 2024-03-06 ENCOUNTER — Telehealth: Payer: Self-pay

## 2024-03-06 NOTE — Telephone Encounter (Signed)
 Pls use Furosemide  - Rx was emailed Thx

## 2024-03-06 NOTE — Telephone Encounter (Signed)
 Copied from CRM [id number]. Topic: Referral - Status >> Mar 06, 2024 11:25 AM Roselie BROCKS wrote: Reason for CRM: Patient is calling for update on referral to a cardiologist, provided its still pending at this time, patient requests a call back to discuss this and other matters,

## 2024-03-07 NOTE — Telephone Encounter (Unsigned)
 Copied from CRM [id number]. Topic: Referral - Status >> Mar 06, 2024 11:25 AM Bailey [name] wrote: Reason for CRM: Patient is calling for update on referral to a cardiologist, provided its still pending at this time, patient requests a call back to discuss this and other matters, >> Mar 06, 2024  5:14 PM Bailey [name] wrote: Patient called back to see if there has been an update as far as when the echocardiogram is scheduled for, adv patient that I do not see where the echocardiogram has been scheduled, however, I do show where the referral was sent for the echocardiogram, adv patient of the contact info for where referral for echocardiogram was sent, phone #[phone] address [address] [place] [address], adv patient she can contact them to have echo scheduled, patient is still req a call back in regards to seeing if she is able to be seen by Dr. Garald this week since he wants to see her this week, but no availability for this week.

## 2024-03-08 ENCOUNTER — Ambulatory Visit: Payer: Self-pay | Admitting: Internal Medicine

## 2024-03-08 ENCOUNTER — Telehealth: Payer: Self-pay

## 2024-03-08 ENCOUNTER — Encounter: Payer: Self-pay | Admitting: Cardiovascular Disease

## 2024-03-08 ENCOUNTER — Other Ambulatory Visit (HOSPITAL_COMMUNITY): Payer: Self-pay

## 2024-03-08 ENCOUNTER — Ambulatory Visit (HOSPITAL_COMMUNITY)
Admission: RE | Admit: 2024-03-08 | Discharge: 2024-03-08 | Disposition: A | Discharge status: Home / Self Care | Source: Ambulatory Visit | Attending: Internal Medicine | Admitting: Internal Medicine

## 2024-03-08 ENCOUNTER — Ambulatory Visit: Admitting: Cardiovascular Disease

## 2024-03-08 ENCOUNTER — Ambulatory Visit: Admitting: Dermatology

## 2024-03-08 ENCOUNTER — Other Ambulatory Visit: Payer: Self-pay | Admitting: Internal Medicine

## 2024-03-08 VITALS — BP 138/70 | HR 68 | Ht 65.5 in | Wt 188.4 lb

## 2024-03-08 DIAGNOSIS — Z7401 Bed confinement status: Secondary | ICD-10-CM | POA: Insufficient documentation

## 2024-03-08 DIAGNOSIS — R059 Cough, unspecified: Secondary | ICD-10-CM | POA: Insufficient documentation

## 2024-03-08 DIAGNOSIS — I340 Nonrheumatic mitral (valve) insufficiency: Secondary | ICD-10-CM | POA: Diagnosis not present

## 2024-03-08 DIAGNOSIS — R0609 Other forms of dyspnea: Secondary | ICD-10-CM | POA: Diagnosis not present

## 2024-03-08 DIAGNOSIS — R0602 Shortness of breath: Secondary | ICD-10-CM | POA: Insufficient documentation

## 2024-03-08 DIAGNOSIS — I517 Cardiomegaly: Secondary | ICD-10-CM | POA: Diagnosis not present

## 2024-03-08 DIAGNOSIS — I110 Hypertensive heart disease with heart failure: Secondary | ICD-10-CM | POA: Diagnosis not present

## 2024-03-08 DIAGNOSIS — Z79899 Other long term (current) drug therapy: Secondary | ICD-10-CM | POA: Diagnosis not present

## 2024-03-08 DIAGNOSIS — Z7984 Long term (current) use of oral hypoglycemic drugs: Secondary | ICD-10-CM | POA: Diagnosis not present

## 2024-03-08 DIAGNOSIS — I5021 Acute systolic (congestive) heart failure: Secondary | ICD-10-CM | POA: Diagnosis not present

## 2024-03-08 DIAGNOSIS — R0600 Dyspnea, unspecified: Secondary | ICD-10-CM

## 2024-03-08 DIAGNOSIS — R062 Wheezing: Secondary | ICD-10-CM | POA: Diagnosis not present

## 2024-03-08 DIAGNOSIS — I5020 Unspecified systolic (congestive) heart failure: Secondary | ICD-10-CM | POA: Insufficient documentation

## 2024-03-08 DIAGNOSIS — I358 Other nonrheumatic aortic valve disorders: Secondary | ICD-10-CM | POA: Diagnosis not present

## 2024-03-08 DIAGNOSIS — I10 Essential (primary) hypertension: Secondary | ICD-10-CM

## 2024-03-08 DIAGNOSIS — I119 Hypertensive heart disease without heart failure: Secondary | ICD-10-CM | POA: Diagnosis present

## 2024-03-08 LAB — ECHOCARDIOGRAM COMPLETE
AR max vel: 1.57 cm2
AV Area VTI: 1.9 cm2
AV Area mean vel: 1.63 cm2
AV Mean grad: 5 mmHg
AV Peak grad: 8.3 mmHg
Ao pk vel: 1.44 m/s
Area-P 1/2: 4.29 cm2
Est EF: 20
S' Lateral: 5.3 cm

## 2024-03-08 MED ORDER — PERFLUTREN LIPID MICROSPHERE
1.0000 mL | INTRAVENOUS | Status: AC | PRN
  Administered 2024-03-08: 2 mL via INTRAVENOUS

## 2024-03-08 MED ORDER — SACUBITRIL-VALSARTAN 24-26 MG PO TABS
1.0000 | ORAL_TABLET | Freq: Two times a day (BID) | ORAL | 11 refills | Status: AC
  Filled 2024-03-08: qty 60, 30d supply, fill #0
  Filled 2024-04-04: qty 60, 30d supply, fill #1

## 2024-03-08 MED ORDER — EMPAGLIFLOZIN 10 MG PO TABS
10.0000 mg | ORAL_TABLET | Freq: Every day | ORAL | 11 refills | Status: AC
  Filled 2024-03-08: qty 30, 30d supply, fill #0
  Filled 2024-04-04: qty 30, 30d supply, fill #1

## 2024-03-08 MED ORDER — CARVEDILOL 3.125 MG PO TABS
3.1250 mg | ORAL_TABLET | Freq: Two times a day (BID) | ORAL | 11 refills | Status: DC
  Filled 2024-03-08: qty 60, 30d supply, fill #0

## 2024-03-08 MED ORDER — FUROSEMIDE 20 MG PO TABS
20.0000 mg | ORAL_TABLET | ORAL | 2 refills | Status: AC | PRN
  Filled 2024-03-08: qty 30, 30d supply, fill #0

## 2024-03-08 NOTE — Progress Notes (Signed)
 " Cardiology Office Note:  .   Date:  03/08/2024  ID:  Bailey [name], DOB 1950/11/30, MRN [id number] PCP: Garald Karlynn GAILS, MD  [facility] Providers Cardiologist:  None   History of Present Illness: .    Chief Complaint  Patient presents with   Shortness of Breath    Bailey [name] is a 74 y.o. female with below history who presents for the evaluation of CHF at the request of Plotnikov, Karlynn GAILS, MD.   History of Present Illness   Bailey [name] is a 74 year old female with systolic heart failure who presents with shortness of breath. She is accompanied by her husband, Alm. She was referred by Dr. Plotnikoff for evaluation of her heart condition.  She has been experiencing shortness of breath and wheezing that began approximately nine days before Christmas, noticeable upon waking. These symptoms led her to stop attending water  aerobics two months ago. The wheezing persisted for four to five days.  Around four days before Christmas, she self-administered azithromycin , hoping to alleviate her symptoms. By two days after Christmas, she felt better, but a week ago Saturday, her condition worsened with low energy and increased coughing. By Sunday, she was bedridden and contacted her primary care physician on Monday.  On Wednesday, her primary care physician conducted an X-ray and prescribed hydrocodone  for her cough, prednisone , a diuretic, and an antibiotic. Her primary care physician informed her that the X-ray showed some inflammation at the base of her lungs and a mildly enlarged heart. She reports improvement in her symptoms with these medications, particularly noting better breathing and reduced coughing.  No chest pain, fever, sore throat, or runny nose. She has a history of high blood pressure, managed with olmesartan , and has never had a heart attack or stroke.           Problem List Systolic HF -EF 20% 03/08/2024 2. HTN    ROS: All other ROS reviewed and  negative. Pertinent positives noted in the HPI.     Studies Reviewed: SABRA   EKG Interpretation Date/Time:  Wednesday March 08 2024 15:00:00 EST Ventricular Rate:  68 PR Interval:  182 QRS Duration:  98 QT Interval:  404 QTC Calculation: 429 R Axis:   41  Text Interpretation: Normal sinus rhythm Nonspecific ST and T wave abnormality Confirmed by Barbaraann Kotyk [phone]) on 03/08/2024 3:05:02 PM   TTE 03/08/2024  1. Left ventricular ejection fraction, by estimation, is 20%. The left  ventricle has severely decreased function. The left ventricle demonstrates  global hypokinesis. The left ventricular internal cavity size was mildly  dilated. There is mild left  ventricular hypertrophy. Left ventricular diastolic parameters are  consistent with Grade III diastolic dysfunction (restrictive). Elevated  left atrial pressure. The E/e' is 26.   2. Right ventricular systolic function is mildly reduced. The right  ventricular size is normal. There is mildly elevated pulmonary artery  systolic pressure. The estimated right ventricular systolic pressure is  41.2 mmHg.   3. Left atrial size was mildly dilated.   4. The mitral valve is grossly normal. Mild mitral valve regurgitation.  No evidence of mitral stenosis.   5. The aortic valve is tricuspid. There is mild calcification of the  aortic valve. Aortic valve regurgitation is not visualized. Aortic valve  sclerosis/calcification is present, without any evidence of aortic  stenosis.   6. The inferior vena cava is normal in size with greater than 50%  respiratory variability, suggesting right atrial pressure  of 3 mmHg.   Physical Exam:   VS:  BP 138/70   Pulse 68   Ht 5' 5.5 (1.664 m)   Wt 188 lb 6.4 oz (85.5 kg)   SpO2 99%   BMI 30.87 kg/m    Wt Readings from Last 3 Encounters:  03/08/24 188 lb 6.4 oz (85.5 kg)  03/01/24 194 lb (88 kg)  12/28/23 193 lb 12.8 oz (87.9 kg)    GEN: Well nourished, well developed in no acute distress NECK:  No JVD; No carotid bruits CARDIAC: RRR, no murmurs, rubs, gallops RESPIRATORY:  Clear to auscultation without rales, wheezing or rhonchi  ABDOMEN: Soft, non-tender, non-distended EXTREMITIES:  No edema; No deformity  ASSESSMENT AND PLAN: .   Assessment and Plan    Acute systolic heart failure with reduced ejection fraction (EF 20%) Severe left ventricular dysfunction with EF 20%. Symptoms improved with furosemide . EKG and echocardiogram do not suggest myocardial infarction. Further investigation for coronary artery disease needed. - Ordered coronary CTA to assess for coronary artery disease. - Checked TSH to evaluate thyroid  function. - Discontinued olmesartan . - Initiated Entresto  24/26 mg BID. - Initiated Jardiance  10 mg daily. - Initiated carvedilol  3.125 mg BID. - Changed furosemide  to 20 mg as needed for shortness of breath or swelling. - Advised against travel until condition is stabilized. - Provided letter advising against travel due to medical reasons.  Essential hypertension Hypertension managed with olmesartan , which will be discontinued due to initiation of new heart failure medications. - Discontinued olmesartan .                Follow-up: Return in about 6 weeks (around 04/19/2024).  Signed, Darryle DASEN. Barbaraann, MD, [facility]  [facility]  [address] [place], [place] [address] [phone]  4:02 PM   "

## 2024-03-08 NOTE — Telephone Encounter (Signed)
 Bailey [name] was seen by Dr. Barbaraann today.  Referral was placed.  Thanks

## 2024-03-08 NOTE — Telephone Encounter (Signed)
 Message received from Dr. Acharya regarding urgent need for pt to be seen by cardiology DOD. Pt had echo ordered by Dr. Garald that came back abnormal. Spoke with pt regarding getting scheduled ASAP. Pt is available today to be seen. Pt scheduled to see Dr. Barbaraann today at 3pm. She plans to let her daughter know and she still arrive early for needed check-in. Pt verbalizes understanding.

## 2024-03-08 NOTE — Patient Instructions (Signed)
 Medication Instructions:  STOP: Olmesartan   START: Entersto 24-26 mg (1 tablet) twice daily START: Jardiance  10 mg (1 tablet) daily START: Carvedilol  3.125 mg (1 tablet) twice daily  Change: Lasix  to 20 mg (1 tablet) as needed  *If you need a refill on your cardiac medications before your next appointment, please call your pharmacy*  Lab Work: TODAY: TSH + FT4/FT3 If you have labs (blood work) drawn today and your tests are completely normal, you will receive your results only by: MyChart Message (if you have MyChart) OR A paper copy in the mail If you have any lab test that is abnormal or we need to change your treatment, we will call you to review the results.  Testing/Procedures:   Your cardiac CT will be scheduled at one of the below locations:    [facility] [address]  [place], [place] [address]    If scheduled at the [facility] and [facility] at [address], please enter the parking lot using the [address] entrance and use the FREE valet service at the patient drop-off area. Enter the building and check-in with registration on the main floor.   If scheduled at [facility], please arrive 30 minutes early for check-in and test prep.  Please follow these instructions carefully (unless otherwise directed):  An IV will be required for this test and Nitroglycerin will be given.  Hold all erectile dysfunction medications at least 3 days (72 hrs) prior to test. (Ie viagra, cialis, sildenafil, tadalafil, etc)   On the Night Before the Test: Be sure to Drink plenty of water . Do not consume any caffeinated/decaffeinated beverages or chocolate 12 hours prior to your test. Do not take any antihistamines 12 hours prior to your test.   On the Day of the Test: Drink plenty of water  until 1 hour prior to the test. Do not eat any food 1 hour prior to test. You may take your regular medications prior to the test.  If you take  Furosemide /Hydrochlorothiazide /Spironolactone/Chlorthalidone, please HOLD on the morning of the test. Patients who wear a continuous glucose monitor MUST remove the device prior to scanning. FEMALES- please wear underwire-free bra if available, avoid dresses & tight clothing        After the Test: Drink plenty of water . After receiving IV contrast, you may experience a mild flushed feeling. This is normal. On occasion, you may experience a mild rash up to 24 hours after the test. This is not dangerous. If this occurs, you can take Benadryl  25 mg, Zyrtec, Claritin, or Allegra and increase your fluid intake. (Patients taking Tikosyn should avoid Benadryl , and may take Zyrtec, Claritin, or Allegra) If you experience trouble breathing, this can be serious. If it is severe call 911 IMMEDIATELY. If it is mild, please call our office.  We will call to schedule your test 2-4 weeks out understanding that some insurance companies will need an authorization prior to the service being performed.   For more information and frequently asked questions, please visit our website : [email or web address]  For non-scheduling related questions, please contact the cardiac imaging nurse navigator should you have any questions/concerns: Cardiac Imaging Nurse Navigators Direct Office Dial: [phone]   For scheduling needs, including cancellations and rescheduling, please call Brittany, [phone].   Follow-Up: At [facility], you and your health needs are our priority.  As part of our continuing mission to provide you with exceptional heart care, our providers are all part of one team.  This team includes your primary Cardiologist (physician) and Advanced Practice Providers or APPs (Physician Assistants and Nurse Practitioners) who all work together to provide you with the care you need, when you need it.  Your next appointment:   6 week(s)  Provider:   Dr. Barbaraann

## 2024-03-09 ENCOUNTER — Ambulatory Visit: Payer: Self-pay | Admitting: Cardiovascular Disease

## 2024-03-09 ENCOUNTER — Telehealth: Payer: Self-pay

## 2024-03-09 ENCOUNTER — Ambulatory Visit: Admitting: Internal Medicine

## 2024-03-09 ENCOUNTER — Encounter: Payer: Self-pay | Admitting: Internal Medicine

## 2024-03-09 VITALS — BP 146/94 | HR 60 | Temp 97.9°F | Ht 65.0 in | Wt 187.0 lb

## 2024-03-09 DIAGNOSIS — Z6831 Body mass index (BMI) 31.0-31.9, adult: Secondary | ICD-10-CM | POA: Diagnosis not present

## 2024-03-09 DIAGNOSIS — J069 Acute upper respiratory infection, unspecified: Secondary | ICD-10-CM | POA: Diagnosis not present

## 2024-03-09 DIAGNOSIS — E66811 Obesity, class 1: Secondary | ICD-10-CM

## 2024-03-09 DIAGNOSIS — I5021 Acute systolic (congestive) heart failure: Secondary | ICD-10-CM

## 2024-03-09 DIAGNOSIS — R062 Wheezing: Secondary | ICD-10-CM

## 2024-03-09 DIAGNOSIS — I10 Essential (primary) hypertension: Secondary | ICD-10-CM | POA: Diagnosis not present

## 2024-03-09 DIAGNOSIS — R7303 Prediabetes: Secondary | ICD-10-CM

## 2024-03-09 DIAGNOSIS — I509 Heart failure, unspecified: Secondary | ICD-10-CM | POA: Insufficient documentation

## 2024-03-09 DIAGNOSIS — R0609 Other forms of dyspnea: Secondary | ICD-10-CM | POA: Diagnosis not present

## 2024-03-09 LAB — TSH+T4F+T3FREE
Free T4: 1.38 ng/dL (ref 0.82–1.77)
T3, Free: 3.1 pg/mL (ref 2.0–4.4)
TSH: 1.44 u[IU]/mL (ref 0.450–4.500)

## 2024-03-09 MED ORDER — HYDROCODONE BIT-HOMATROP MBR 5-1.5 MG/5ML PO SOLN: 5.0000 mL | Freq: Three times a day (TID) | ORAL | 0 refills | Status: DC | PRN

## 2024-03-09 NOTE — Assessment & Plan Note (Signed)
 Monitor A1

## 2024-03-09 NOTE — Assessment & Plan Note (Signed)
 Resolved

## 2024-03-09 NOTE — Assessment & Plan Note (Signed)
 EF20% Consuelo saw Dr Barbaraann Now on Entresto , Coreg , Jardiance , Lasix  prn

## 2024-03-09 NOTE — Assessment & Plan Note (Signed)
 Better w/URI and CHF treatment Albuterol  2 mg qid prn po was n/a

## 2024-03-09 NOTE — Assessment & Plan Note (Signed)
 Much better

## 2024-03-09 NOTE — Telephone Encounter (Signed)
 Copied from CRM #[id number]. Topic: Clinical - Request for Lab/Test Order >> Mar 09, 2024 12:00 PM Leah C wrote: Reason for CRM: Patient called in to see if the orders placed in the lab were for her appt after today. Patient had the same labs done last Wednesday and just wants to be sure if she needs to get those same labs done this week or for another time?   Patient would like clarification.    [phone] (M)

## 2024-03-09 NOTE — Telephone Encounter (Signed)
 Natahsa was seen by Dr. Barbaraann today.  Referral was placed.  Thanks

## 2024-03-09 NOTE — Progress Notes (Signed)
 "  Subjective:  Patient ID: Bailey [name], female    DOB: 11/22/1950  Age: 74 y.o. MRN: [id number]  CC: Medical Management of Chronic Issues (Patient has some different things to discuss. )   HPI Bailey [name] presents for CHF f/u. EF 20%. F/u on URI, HTN.  Outpatient Medications Prior to Visit  Medication Sig Dispense Refill   carvedilol  (COREG ) 3.125 MG tablet Take 1 tablet (3.125 mg total) by mouth 2 (two) times daily with a meal. 60 tablet 11   cefUROXime  (CEFTIN ) 500 MG tablet Take 1 tablet (500 mg total) by mouth 2 (two) times daily with a meal for 10 days. 20 tablet 0   colchicine  0.6 MG tablet Take two tablets prn gout attack.Then take another one in 1-2 hrs. Do not repeat for 3 days. 18 tablet 1   Cranberry (GENNAMD) 130 MG CAPS Take by mouth.     empagliflozin  (JARDIANCE ) 10 MG TABS tablet Take 1 tablet (10 mg total) by mouth daily before breakfast. 30 tablet 11   fluorouracil  (EFUDEX ) 5 % cream      furosemide  (LASIX ) 20 MG tablet Take 1 tablet (20 mg total) by mouth as needed. 30 tablet 2   HYDROcodone  bit-homatropine (HYCODAN) 5-1.5 MG/5ML syrup Take 5 mLs by mouth every 8 (eight) hours as needed for cough. 120 mL 0   phenazopyridine  (PYRIDIUM ) 95 MG tablet Take by mouth.     promethazine  (PHENERGAN ) 12.5 MG tablet Take 1-2 tablets (12.5-25 mg total) by mouth every 6 (six) hours as needed for up to 7 days for nausea or vomiting. 60 tablet 1   sacubitril -valsartan  (ENTRESTO ) 24-26 MG Take 1 tablet by mouth 2 (two) times daily. 60 tablet 11   triamcinolone  ointment (KENALOG ) 0.1 % Apply 1 Application topically 2 (two) times daily as needed (Rash). 453.6 g 0   albuterol  (PROVENTIL ) 2 MG tablet Take 1-2 tablets (2-4 mg total) by mouth 4 (four) times daily as needed for wheezing or shortness of breath. (Patient not taking: Reported on 03/09/2024) 100 tablet 0   Biotin 1 MG CAPS Take by mouth.     Cholecalciferol (VITAMIN D3) 50 MCG (2000 UT) capsule Take 1 capsule (2,000 Units  total) by mouth daily. 100 capsule 3   hydrALAZINE  (APRESOLINE ) 25 MG tablet Take 1 tablet (25 mg total) by mouth 3 (three) times daily as needed. (Patient not taking: Reported on 03/09/2024) 30 tablet 0   methylPREDNISolone  (MEDROL  DOSEPAK) 4 MG TBPK tablet As directed (Patient not taking: Reported on 03/09/2024) 21 tablet 0   tirzepatide  (ZEPBOUND ) 2.5 MG/0.5ML injection vial Inject 2.5 mg into the skin once a week. (Patient not taking: Reported on 03/01/2024) 2 mL 5   vitamin B-12 (V-R VITAMIN B-12) 500 MCG tablet Take 1 tablet (500 mcg total) by mouth daily. 100 tablet 3   zinc gluconate 50 MG tablet Take 50 mg by mouth daily.     No facility-administered medications prior to visit.    ROS: Review of Systems  Constitutional:  Negative for activity change, appetite change, chills, fatigue and unexpected weight change.  HENT:  Negative for congestion, mouth sores and sinus pressure.   Eyes:  Negative for visual disturbance.  Respiratory:  Positive for cough. Negative for chest tightness and shortness of breath.   Gastrointestinal:  Negative for abdominal pain and nausea.  Genitourinary:  Negative for difficulty urinating, frequency and vaginal pain.  Musculoskeletal:  Negative for arthralgias, back pain and gait problem.  Skin:  Negative for pallor and  rash.  Neurological:  Negative for dizziness, tremors, weakness, numbness and headaches.  Psychiatric/Behavioral:  Negative for confusion, sleep disturbance and suicidal ideas.     Objective:  BP (!) 146/94   Pulse 60   Temp 97.9 F (36.6 C) (Oral)   Ht 5' 5 (1.651 m)   Wt 187 lb (84.8 kg)   SpO2 96%   BMI 31.12 kg/m   BP Readings from Last 3 Encounters:  03/09/24 (!) 146/94  03/08/24 138/70  03/01/24 138/88    Wt Readings from Last 3 Encounters:  03/09/24 187 lb (84.8 kg)  03/08/24 188 lb 6.4 oz (85.5 kg)  03/01/24 194 lb (88 kg)    Physical Exam Constitutional:      General: She is not in acute distress.     Appearance: She is well-developed. She is obese.  HENT:     Head: Normocephalic.     Right Ear: External ear normal.     Left Ear: External ear normal.     Nose: Nose normal.  Eyes:     General:        Right eye: No discharge.        Left eye: No discharge.     Conjunctiva/sclera: Conjunctivae normal.     Pupils: Pupils are equal, round, and reactive to light.  Neck:     Thyroid : No thyromegaly.     Vascular: No JVD.     Trachea: No tracheal deviation.  Cardiovascular:     Rate and Rhythm: Normal rate and regular rhythm.     Heart sounds: Normal heart sounds.  Pulmonary:     Effort: No respiratory distress.     Breath sounds: No stridor. No wheezing.  Abdominal:     General: Bowel sounds are normal. There is no distension.     Palpations: Abdomen is soft. There is no mass.     Tenderness: There is no abdominal tenderness. There is no guarding or rebound.  Musculoskeletal:        General: No tenderness.     Cervical back: Normal range of motion and neck supple. No rigidity.  Lymphadenopathy:     Cervical: No cervical adenopathy.  Skin:    Findings: No erythema or rash.  Neurological:     Mental Status: She is oriented to person, place, and time.     Cranial Nerves: No cranial nerve deficit.     Motor: No weakness or abnormal muscle tone.     Coordination: Coordination normal.     Deep Tendon Reflexes: Reflexes normal.  Psychiatric:        Behavior: Behavior normal.        Thought Content: Thought content normal.        Judgment: Judgment normal.     Lab Results  Component Value Date   WBC 6.5 03/01/2024   HGB 15.3 (H) 03/01/2024   HCT 45.1 03/01/2024   PLT 215.0 03/01/2024   GLUCOSE 120 (H) 03/01/2024   CHOL 227 (H) 01/27/2021   TRIG 91.0 01/27/2021   HDL 50.90 01/27/2021   LDLDIRECT 132.0 07/10/2019   LDLCALC 158 (H) 01/27/2021   ALT 24 03/01/2024   AST 22 03/01/2024   NA 138 03/01/2024   K 4.3 03/01/2024   CL 103 03/01/2024   CREATININE 0.75 03/01/2024    BUN 16 03/01/2024   CO2 28 03/01/2024   TSH 1.440 03/08/2024   INR 1.0 08/07/2019   HGBA1C 6.2 09/14/2023    ECHOCARDIOGRAM COMPLETE Result Date: 03/08/2024  ECHOCARDIOGRAM REPORT   Patient Name:   Bailey [name] Date of Exam: 03/08/2024 Medical Rec #:  [id number]       Height:       65.5 in Accession #:    [id number]      Weight:       194.0 lb Date of Birth:  [birth year]       BSA:          1.964 m Patient Age:    73 years        BP:           138/88 mmHg Patient Gender: F               HR:           74 bpm. Exam Location:  Outpatient Procedure: 2D Echo, Cardiac Doppler, Color Doppler and Intracardiac            Opacification Agent (Both Spectral and Color Flow Doppler were            utilized during procedure). Indications:    Cardiomegaly I51.7  History:        Patient has no prior history of Echocardiogram examinations.                 Risk Factors:Hypertension.  Sonographer:    Jayson Gaskins Referring Phys: [id number] Pacey Altizer V Shamera Yarberry IMPRESSIONS  1. Left ventricular ejection fraction, by estimation, is 20%. The left ventricle has severely decreased function. The left ventricle demonstrates global hypokinesis. The left ventricular internal cavity size was mildly dilated. There is mild left ventricular hypertrophy. Left ventricular diastolic parameters are consistent with Grade III diastolic dysfunction (restrictive). Elevated left atrial pressure. The E/e' is 26.  2. Right ventricular systolic function is mildly reduced. The right ventricular size is normal. There is mildly elevated pulmonary artery systolic pressure. The estimated right ventricular systolic pressure is 41.2 mmHg.  3. Left atrial size was mildly dilated.  4. The mitral valve is grossly normal. Mild mitral valve regurgitation. No evidence of mitral stenosis.  5. The aortic valve is tricuspid. There is mild calcification of the aortic valve. Aortic valve regurgitation is not visualized. Aortic valve sclerosis/calcification is present,  without any evidence of aortic stenosis.  6. The inferior vena cava is normal in size with greater than 50% respiratory variability, suggesting right atrial pressure of 3 mmHg. Conclusion(s)/Recommendation(s): No left ventricular mural or apical thrombus/thrombi. Recommend cardiology consultation for severely reduced EF. Reached out to ordering physician. FINDINGS  Left Ventricle: Left ventricular ejection fraction, by estimation, is 20%. The left ventricle has severely decreased function. The left ventricle demonstrates global hypokinesis. The left ventricular internal cavity size was mildly dilated. There is mild left ventricular hypertrophy. Left ventricular diastolic parameters are consistent with Grade III diastolic dysfunction (restrictive). Elevated left atrial pressure. The E/e' is 17. Right Ventricle: The right ventricular size is normal. Right vetricular wall thickness was not well visualized. Right ventricular systolic function is mildly reduced. There is mildly elevated pulmonary artery systolic pressure. The tricuspid regurgitant velocity is 3.09 m/s, and with an assumed right atrial pressure of 3 mmHg, the estimated right ventricular systolic pressure is 41.2 mmHg. Left Atrium: Left atrial size was mildly dilated. Right Atrium: Right atrial size was normal in size. Pericardium: Trivial pericardial effusion is present. Mitral Valve: The mitral valve is grossly normal. Mild mitral valve regurgitation. No evidence of mitral valve stenosis. Tricuspid Valve: The tricuspid valve is normal in structure. Tricuspid valve regurgitation is trivial.  No evidence of tricuspid stenosis. Aortic Valve: The aortic valve is tricuspid. There is mild calcification of the aortic valve. Aortic valve regurgitation is not visualized. Aortic valve sclerosis/calcification is present, without any evidence of aortic stenosis. Aortic valve mean gradient measures 5.0 mmHg. Aortic valve peak gradient measures 8.3 mmHg. Aortic valve  area, by VTI measures 1.90 cm. Pulmonic Valve: The pulmonic valve was normal in structure. Pulmonic valve regurgitation is trivial. No evidence of pulmonic stenosis. Aorta: The aortic root is normal in size and structure. Venous: The inferior vena cava is normal in size with greater than 50% respiratory variability, suggesting right atrial pressure of 3 mmHg. IAS/Shunts: The interatrial septum was not well visualized.  LEFT VENTRICLE PLAX 2D LVIDd:         5.70 cm   Diastology LVIDs:         5.30 cm   LV e' medial:    4.46 cm/s LV PW:         1.20 cm   LV E/e' medial:  26.5 LV IVS:        1.30 cm   LV e' lateral:   6.64 cm/s LVOT diam:     1.81 cm   LV E/e' lateral: 17.8 LV SV:         48 LV SV Index:   25 LVOT Area:     2.57 cm  RIGHT VENTRICLE RV S prime:     13.50 cm/s TAPSE (M-mode): 2.9 cm LEFT ATRIUM           Index        RIGHT ATRIUM          Index LA Vol (A2C): 91.1 ml 46.39 ml/m  RA Area:     9.61 cm LA Vol (A4C): 48.1 ml 24.49 ml/m  RA Volume:   18.10 ml 9.22 ml/m  AORTIC VALVE AV Area (Vmax):    1.57 cm AV Area (Vmean):   1.63 cm AV Area (VTI):     1.90 cm AV Vmax:           144.00 cm/s AV Vmean:          108.000 cm/s AV VTI:            0.254 m AV Peak Grad:      8.3 mmHg AV Mean Grad:      5.0 mmHg LVOT Vmax:         87.80 cm/s LVOT Vmean:        68.600 cm/s LVOT VTI:          0.188 m LVOT/AV VTI ratio: 0.74  AORTA Ao Root diam: 2.39 cm MITRAL VALVE                TRICUSPID VALVE MV Area (PHT): 4.29 cm     TR Peak grad:   38.2 mmHg MV Decel Time: 177 msec     TR Vmax:        309.00 cm/s MV E velocity: 118.00 cm/s MV A velocity: 43.50 cm/s   SHUNTS MV E/A ratio:  2.71         Systemic VTI:  0.19 m                             Systemic Diam: 1.81 cm Soyla Merck MD Electronically signed by Soyla Merck MD Signature Date/Time: 03/08/2024/12:32:06 PM    Final     Assessment & Plan:   Problem List Items  Addressed This Visit     Essential hypertension (Chronic)   Check labs       Prediabetes   Monitor A1      Obesity (BMI 30.0-34.9)   On diet       Upper respiratory infection   Better w/URI and CHF treatment Albuterol  2 mg qid prn po was n/a      Wheezing   Resolved      DOE (dyspnea on exertion)   Much better      CHF (congestive heart failure) (HCC) - Primary   EF20% Apryll saw Dr Barbaraann Now on Entresto , Coreg , Jardiance , Lasix  prn      Relevant Orders   Comprehensive metabolic panel with GFR   Magnesium      No orders of the defined types were placed in this encounter.     Follow-up: Return for Wellness Exam.  Marolyn Noel, MD "

## 2024-03-09 NOTE — Assessment & Plan Note (Signed)
 Check labs

## 2024-03-09 NOTE — Assessment & Plan Note (Signed)
  On diet  

## 2024-03-20 NOTE — Telephone Encounter (Signed)
 Her labs were ordered.  Thanks

## 2024-03-21 ENCOUNTER — Encounter (HOSPITAL_COMMUNITY): Payer: Self-pay

## 2024-03-23 ENCOUNTER — Ambulatory Visit (HOSPITAL_COMMUNITY)
Admission: RE | Admit: 2024-03-23 | Discharge: 2024-03-23 | Disposition: A | Discharge status: Home / Self Care | Source: Ambulatory Visit | Attending: Cardiology | Admitting: Cardiology

## 2024-03-23 DIAGNOSIS — R0602 Shortness of breath: Secondary | ICD-10-CM | POA: Diagnosis present

## 2024-03-23 DIAGNOSIS — I2510 Atherosclerotic heart disease of native coronary artery without angina pectoris: Secondary | ICD-10-CM | POA: Insufficient documentation

## 2024-03-23 DIAGNOSIS — R079 Chest pain, unspecified: Secondary | ICD-10-CM | POA: Diagnosis not present

## 2024-03-23 MED ORDER — NITROGLYCERIN 0.4 MG SL SUBL
0.8000 mg | SUBLINGUAL_TABLET | Freq: Once | SUBLINGUAL | Status: AC
  Administered 2024-03-23: 0.8 mg via SUBLINGUAL

## 2024-03-23 MED ORDER — IOHEXOL 350 MG/ML SOLN
100.0000 mL | Freq: Once | INTRAVENOUS | Status: AC | PRN
  Administered 2024-03-23: 100 mL via INTRAVENOUS

## 2024-03-28 ENCOUNTER — Ambulatory Visit (HOSPITAL_COMMUNITY): Payer: Self-pay | Admitting: Cardiology

## 2024-03-28 ENCOUNTER — Ambulatory Visit (HOSPITAL_COMMUNITY)
Admission: RE | Admit: 2024-03-28 | Discharge: 2024-03-28 | Disposition: A | Source: Ambulatory Visit | Attending: Cardiology | Admitting: Cardiology

## 2024-03-28 ENCOUNTER — Encounter (HOSPITAL_COMMUNITY): Payer: Self-pay | Admitting: Cardiology

## 2024-03-28 VITALS — BP 160/98 | HR 52 | Ht 65.0 in | Wt 189.0 lb

## 2024-03-28 DIAGNOSIS — I2510 Atherosclerotic heart disease of native coronary artery without angina pectoris: Secondary | ICD-10-CM | POA: Diagnosis not present

## 2024-03-28 DIAGNOSIS — I5022 Chronic systolic (congestive) heart failure: Secondary | ICD-10-CM | POA: Insufficient documentation

## 2024-03-28 DIAGNOSIS — I428 Other cardiomyopathies: Secondary | ICD-10-CM | POA: Insufficient documentation

## 2024-03-28 DIAGNOSIS — I110 Hypertensive heart disease with heart failure: Secondary | ICD-10-CM | POA: Diagnosis present

## 2024-03-28 DIAGNOSIS — E785 Hyperlipidemia, unspecified: Secondary | ICD-10-CM

## 2024-03-28 DIAGNOSIS — Z7984 Long term (current) use of oral hypoglycemic drugs: Secondary | ICD-10-CM | POA: Diagnosis not present

## 2024-03-28 DIAGNOSIS — Z79899 Other long term (current) drug therapy: Secondary | ICD-10-CM | POA: Insufficient documentation

## 2024-03-28 LAB — COMPREHENSIVE METABOLIC PANEL WITH GFR
ALT: 15 U/L (ref 0–44)
AST: 19 U/L (ref 15–41)
Albumin: 4.2 g/dL (ref 3.5–5.0)
Alkaline Phosphatase: 92 U/L (ref 38–126)
Anion gap: 10 (ref 5–15)
BUN: 11 mg/dL (ref 8–23)
CO2: 25 mmol/L (ref 22–32)
Calcium: 10.2 mg/dL (ref 8.9–10.3)
Chloride: 104 mmol/L (ref 98–111)
Creatinine, Ser: 0.8 mg/dL (ref 0.44–1.00)
GFR, Estimated: >60 mL/min
Glucose, Bld: 105 mg/dL — ABNORMAL HIGH (ref 70–99)
Potassium: 4.6 mmol/L (ref 3.5–5.1)
Sodium: 138 mmol/L (ref 135–145)
Total Bilirubin: 0.8 mg/dL (ref 0.0–1.2)
Total Protein: 7.1 g/dL (ref 6.5–8.1)

## 2024-03-28 LAB — CBC
HCT: 46.8 % — ABNORMAL HIGH (ref 36.0–46.0)
Hemoglobin: 16.4 g/dL — ABNORMAL HIGH (ref 12.0–15.0)
MCH: 32.2 pg (ref 26.0–34.0)
MCHC: 35 g/dL (ref 30.0–36.0)
MCV: 91.9 fL (ref 80.0–100.0)
Platelets: 205 10*3/uL (ref 150–400)
RBC: 5.09 MIL/uL (ref 3.87–5.11)
RDW: 12.9 % (ref 11.5–15.5)
WBC: 8.4 10*3/uL (ref 4.0–10.5)
nRBC: 0 % (ref 0.0–0.2)

## 2024-03-28 LAB — PRO BRAIN NATRIURETIC PEPTIDE: Pro Brain Natriuretic Peptide: 1286 pg/mL — ABNORMAL HIGH

## 2024-03-28 LAB — LIPID PANEL
Cholesterol: 241 mg/dL — ABNORMAL HIGH (ref 0–200)
HDL: 52 mg/dL
LDL Cholesterol: 154 mg/dL — ABNORMAL HIGH (ref 0–99)
Total CHOL/HDL Ratio: 4.7 ratio
Triglycerides: 178 mg/dL — ABNORMAL HIGH
VLDL: 36 mg/dL (ref 0–40)

## 2024-03-28 MED ORDER — SPIRONOLACTONE 25 MG PO TABS: 12.5000 mg | ORAL_TABLET | Freq: Every day | ORAL | 3 refills | Status: AC

## 2024-03-28 NOTE — Patient Instructions (Signed)
 STOP Carvedilol   START Spironolactone  12.5 mg ( 1/2 Tab) daily.  Labs done today, your results will be available in MyChart, we will contact you for abnormal readings.  REPEAT blood work in 10 days.  Your physician has requested that you have a cardiac MRI. Cardiac MRI uses a computer to create images of your heart as its beating, producing both still and moving pictures of your heart and major blood vessels. For further information please visit [email or web address]. Please follow the instruction sheet given to you today for more information.  Please follow up with our heart failure pharmacist as scheduled.  Your physician recommends that you schedule a follow-up appointment in: as scheduled.   If you have any questions or concerns before your next appointment please send us  a message through [place] or call our office at [phone].    TO LEAVE A MESSAGE FOR THE NURSE SELECT OPTION 2, PLEASE LEAVE A MESSAGE INCLUDING: YOUR NAME DATE OF BIRTH CALL BACK NUMBER REASON FOR CALL**this is important as we prioritize the call backs  YOU WILL RECEIVE A CALL BACK THE SAME DAY AS LONG AS YOU CALL BEFORE 4:00 PM  At the [facility], you and your health needs are our priority. As part of our continuing mission to provide you with exceptional heart care, we have created designated Provider Care Teams. These Care Teams include your primary Cardiologist (physician) and Advanced Practice Providers (APPs- Physician Assistants and Nurse Practitioners) who all work together to provide you with the care you need, when you need it.   You may see any of the following providers on your designated Care Team at your next follow up: Dr Toribio Fuel Dr Ezra Shuck Dr. Morene Brownie Greig Mosses, NP Caffie Shed, [place] [facility] [place], [place] Beckey Coe, NP Jordan Lee, NP Ellouise Class, NP Tinnie Redman, PharmD Jaun Bash, PharmD   Please be sure to bring in all your  medications bottles to every appointment.    Thank you for choosing [facility]

## 2024-03-28 NOTE — Progress Notes (Signed)
 PCP: Plotnikov, Karlynn GAILS, MD Cardiology:  Dr. Barbaraann HF Cardiology: Dr. Rolan  Chief complaint: CHF  74 y.o. with history of HTN, hyperlipidemia, and chronic systolic CHF/nonischemic cardiomyopathy was referred by Dr. Barbaraann for evaluation of CHF. Patient has generally been in good health.  She remotely had c-spine surgery.  She has had untreated hyperlipidemia, had myalgias with multiple statins. About 10 days before Christmas in 12/25, she developed wheezing and coughing and noted shortness of breath walking across the house.  She saw her PCP, CXR done showing pulmonary edema.  She was started on Lasix  and given antibiotics, steroids, and inhaler.  She felt better relatively quickly, cough and dyspnea resolved.  She is no longer taking Lasix  regularly (has not taken recently).  Echo was done in 1/26, showing EF 20%, mild LVH, mild LV dilation, mild RV dysfunction, PASP 41 mmHg, mild MR, IVC normal.  She saw cardiology and HF meds were started.  Coronary CTA was done, showing CAC 134 AU (70th percentile), mild nonobstructive CAD.  She says that she has been feeling weak/drained since starting HF meds.  HR is in the 40s today in the office.  She is not having significant exertional dyspnea (able to mop, clean house without dyspnea) and cough has resolved.  No chest pain.  No orthopnea/PND.  No palpitations, she is in NSR today.  Nonsmoker, no ETOH.     ECG (personally reviewed): NSR at 47, lateral TWIs  Labs (12/25): TSH normal, K 4.3, creatinine 0.76  PMH: 1. Hyperlipidemia: Myalgias with statins.  2. HTN 3. Chronic systolic CHF: Nonischemic cardiomyopathy.  - Echo (1/26): EF 20%, mild LVH, mild LV dilation, mild RV dysfunction, PASP 41 mmHg, mild MR, IVC normal. - Coronary CTA (1/26): CAC 134 AU (70th percentile), mild nonobstructive CAD.  4. CAD: Coronary CTA (1/26) showed CAC 134 AU (70th percentile), mild nonobstructive CAD.  5. C-spine surgery  FH: Father with CVA, mother with  brain aneurysm, sister with rheumatic fever and mitral valve replacement, brother with CABG.   SH: Married, lives in [place], no smoking/ETOH, 2 children.   ROS: All systems reviewed and negative except as per HPI.   Current Outpatient Medications  Medication Sig Dispense Refill   empagliflozin  (JARDIANCE ) 10 MG TABS tablet Take 1 tablet (10 mg total) by mouth daily before breakfast. 30 tablet 11   fluorouracil  (EFUDEX ) 5 % cream      furosemide  (LASIX ) 20 MG tablet Take 1 tablet (20 mg total) by mouth as needed. 30 tablet 2   sacubitril -valsartan  (ENTRESTO ) 24-26 MG Take 1 tablet by mouth 2 (two) times daily. 60 tablet 11   spironolactone  (ALDACTONE ) 25 MG tablet Take 0.5 tablets (12.5 mg total) by mouth daily. 45 tablet 3   triamcinolone  ointment (KENALOG ) 0.1 % Apply 1 Application topically 2 (two) times daily as needed (Rash). 453.6 g 0   Biotin 1 MG CAPS Take by mouth. (Patient not taking: Reported on 03/28/2024)     Cholecalciferol (VITAMIN D3) 50 MCG (2000 UT) capsule Take 1 capsule (2,000 Units total) by mouth daily. (Patient not taking: Reported on 03/28/2024) 100 capsule 3   colchicine  0.6 MG tablet Take two tablets prn gout attack.Then take another one in 1-2 hrs. Do not repeat for 3 days. (Patient not taking: Reported on 03/28/2024) 18 tablet 1   Cranberry (GENNAMD) 130 MG CAPS Take by mouth. (Patient not taking: Reported on 03/28/2024)     tirzepatide  (ZEPBOUND ) 2.5 MG/0.5ML injection vial Inject 2.5 mg into the skin once  a week. (Patient not taking: Reported on 03/28/2024) 2 mL 5   vitamin B-12 (V-R VITAMIN B-12) 500 MCG tablet Take 1 tablet (500 mcg total) by mouth daily. (Patient not taking: Reported on 03/28/2024) 100 tablet 3   zinc gluconate 50 MG tablet Take 50 mg by mouth daily. (Patient not taking: Reported on 03/28/2024)     No current facility-administered medications for this encounter.   BP (!) 160/98   Pulse (!) 52   Ht 5' 5 (1.651 m)   Wt 85.7 kg (189 lb)   SpO2 98%    BMI 31.45 kg/m  General: NAD Neck: No JVD, no thyromegaly or thyroid  nodule.  Lungs: Clear to auscultation bilaterally with normal respiratory effort. CV: Nondisplaced PMI.  Heart regular S1/S2, no S3/S4, no murmur.  No peripheral edema.  No carotid bruit.  Normal pedal pulses.  Abdomen: Soft, nontender, no hepatosplenomegaly, no distention.  Skin: Intact without lesions or rashes.  Neurologic: Alert and oriented x 3.  Psych: Normal affect. Extremities: No clubbing or cyanosis.  HEENT: Normal.   Assessment/Plan: 1. Chronic systolic CHF: Nonischemic cardiomyopathy. Echo in 1/26 showed EF 20%, mild LVH, mild LV dilation, mild RV dysfunction, PASP 41 mmHg, mild MR, IVC normal.  Coronary CTA in 1/26 showed mild nonobstructive CAD. She has a family history of vascular disease but not cardiomyopathy.  No ETOH, drugs.  Symptoms could be consistent with a pre-existing viral syndrome so viral myocarditis remains a possibility.  She is not volume overloaded on exam.  NYHA class II-III, primarily due to weakness/drained feeling.  No significant dyspnea.  HR is in the 40s today.  - With prominent weakness/fatigue and bradycardia, I will have her stop Coreg . This may be contributing to her symptoms.  Could try to use low dose Toprol XL or bisoprolol in the future.  - Continue Entresto  24/26 bid.   - Continue Jardiance  10 mg daily.  - Add spironolactone  12.5 daily.  BMET/BNP today and again in 10 days.  - I will arrange for cardiac MRI to assess for infiltrative disease/myocarditis.  2.  CAD: Coronary CTA in 1/26 showed CAC 134 AU (70th percentile), mild nonobstructive CAD.  No chest pain.  LDL has historically been in the 150s but she has not tolerated statins.  - I will refer to lipid clinic to see if she can get Repatha or Leqvio.  3. HTN: BP elevated today, adding spironolactone .    Followup in 3 weeks with HF pharmacist for med titration.  See me in 6 wks.   I spent 56 minutes reviewing records,  interviewing/examining patient, and managing orders.   Ezra Shuck 03/28/2024 2:13 PM

## 2024-03-29 ENCOUNTER — Ambulatory Visit: Admitting: Internal Medicine

## 2024-03-29 ENCOUNTER — Other Ambulatory Visit (HOSPITAL_COMMUNITY): Payer: Self-pay

## 2024-03-30 ENCOUNTER — Encounter (HOSPITAL_COMMUNITY): Payer: Self-pay

## 2024-03-30 ENCOUNTER — Telehealth (HOSPITAL_COMMUNITY): Payer: Self-pay

## 2024-03-30 ENCOUNTER — Other Ambulatory Visit (HOSPITAL_COMMUNITY): Payer: Self-pay

## 2024-03-30 NOTE — Telephone Encounter (Signed)
 Advanced [facility] Failure Patient Advocate Encounter  This patient was approved for a [employer] grant that will help cover the cost of Entresto , Jardiance , Spironolactone .  Total amount awarded, $7,500.  Effective: 02/29/2024 - 02/27/2025.  BIN [id number] PCN [id number] [facility] [id number] ID [id number]  Pharmacy provided with approval and processing information. Patient informed via MyChart.  Rachel DEL, CPhT Rx Patient Advocate Phone: [phone]

## 2024-04-04 ENCOUNTER — Other Ambulatory Visit (HOSPITAL_COMMUNITY): Payer: Self-pay

## 2024-04-07 ENCOUNTER — Ambulatory Visit (HOSPITAL_COMMUNITY): Admission: RE | Admit: 2024-04-07 | Source: Ambulatory Visit

## 2024-04-07 DIAGNOSIS — I5022 Chronic systolic (congestive) heart failure: Secondary | ICD-10-CM

## 2024-04-07 LAB — BASIC METABOLIC PANEL WITH GFR
Anion gap: 7 (ref 5–15)
BUN: 14 mg/dL (ref 8–23)
CO2: 28 mmol/L (ref 22–32)
Calcium: 10.2 mg/dL (ref 8.9–10.3)
Chloride: 104 mmol/L (ref 98–111)
Creatinine, Ser: 0.87 mg/dL (ref 0.44–1.00)
GFR, Estimated: >60 mL/min
Glucose, Bld: 125 mg/dL — ABNORMAL HIGH (ref 70–99)
Potassium: 4.8 mmol/L (ref 3.5–5.1)
Sodium: 140 mmol/L (ref 135–145)

## 2024-04-12 ENCOUNTER — Ambulatory Visit (HOSPITAL_COMMUNITY)

## 2024-04-19 ENCOUNTER — Ambulatory Visit (HOSPITAL_COMMUNITY)

## 2024-04-26 ENCOUNTER — Ambulatory Visit: Admitting: Internal Medicine

## 2024-05-12 ENCOUNTER — Ambulatory Visit: Admitting: Pharmacist

## 2024-05-30 ENCOUNTER — Ambulatory Visit

## 2024-05-30 ENCOUNTER — Encounter: Admitting: Internal Medicine

## 2024-06-26 ENCOUNTER — Ambulatory Visit (HOSPITAL_COMMUNITY): Admitting: Cardiology
